# Patient Record
Sex: Female | Born: 1940 | Race: White | Hispanic: No | Smoking: Former smoker
Health system: Southern US, Community
[De-identification: ages and names within clinical notes are randomized; demographics above are authoritative.]

## PROBLEM LIST (undated history)

## (undated) DIAGNOSIS — J471 Bronchiectasis with (acute) exacerbation: Secondary | ICD-10-CM

## (undated) DIAGNOSIS — Z170 Estrogen receptor positive status [ER+]: Secondary | ICD-10-CM

## (undated) DIAGNOSIS — J439 Emphysema, unspecified: Secondary | ICD-10-CM

## (undated) DIAGNOSIS — N61 Mastitis without abscess: Secondary | ICD-10-CM

## (undated) DIAGNOSIS — Z8041 Family history of malignant neoplasm of ovary: Secondary | ICD-10-CM

## (undated) DIAGNOSIS — M1990 Unspecified osteoarthritis, unspecified site: Secondary | ICD-10-CM

## (undated) DIAGNOSIS — Z87891 Personal history of nicotine dependence: Secondary | ICD-10-CM

## (undated) DIAGNOSIS — Z8042 Family history of malignant neoplasm of prostate: Secondary | ICD-10-CM

## (undated) DIAGNOSIS — Z923 Personal history of irradiation: Secondary | ICD-10-CM

## (undated) DIAGNOSIS — I700 Atherosclerosis of aorta: Secondary | ICD-10-CM

## (undated) DIAGNOSIS — R0781 Pleurodynia: Secondary | ICD-10-CM

## (undated) DIAGNOSIS — R011 Cardiac murmur, unspecified: Secondary | ICD-10-CM

## (undated) DIAGNOSIS — M8580 Other specified disorders of bone density and structure, unspecified site: Secondary | ICD-10-CM

## (undated) DIAGNOSIS — K449 Diaphragmatic hernia without obstruction or gangrene: Secondary | ICD-10-CM

## (undated) DIAGNOSIS — F329 Major depressive disorder, single episode, unspecified: Secondary | ICD-10-CM

## (undated) DIAGNOSIS — E559 Vitamin D deficiency, unspecified: Secondary | ICD-10-CM

## (undated) DIAGNOSIS — K635 Polyp of colon: Secondary | ICD-10-CM

## (undated) DIAGNOSIS — J479 Bronchiectasis, uncomplicated: Secondary | ICD-10-CM

## (undated) DIAGNOSIS — J449 Chronic obstructive pulmonary disease, unspecified: Secondary | ICD-10-CM

## (undated) DIAGNOSIS — C801 Malignant (primary) neoplasm, unspecified: Secondary | ICD-10-CM

## (undated) DIAGNOSIS — Z801 Family history of malignant neoplasm of trachea, bronchus and lung: Secondary | ICD-10-CM

## (undated) DIAGNOSIS — Z1379 Encounter for other screening for genetic and chromosomal anomalies: Secondary | ICD-10-CM

## (undated) DIAGNOSIS — I2510 Atherosclerotic heart disease of native coronary artery without angina pectoris: Secondary | ICD-10-CM

## (undated) DIAGNOSIS — K219 Gastro-esophageal reflux disease without esophagitis: Secondary | ICD-10-CM

## (undated) DIAGNOSIS — C50312 Malignant neoplasm of lower-inner quadrant of left female breast: Secondary | ICD-10-CM

## (undated) DIAGNOSIS — B029 Zoster without complications: Secondary | ICD-10-CM

## (undated) DIAGNOSIS — J189 Pneumonia, unspecified organism: Secondary | ICD-10-CM

## (undated) DIAGNOSIS — R0600 Dyspnea, unspecified: Secondary | ICD-10-CM

## (undated) HISTORY — DX: Polyp of colon: K63.5

## (undated) HISTORY — DX: Major depressive disorder, single episode, unspecified: F32.9

## (undated) HISTORY — PX: TONSILLECTOMY: SUR1361

## (undated) HISTORY — PX: CATARACT EXTRACTION, BILATERAL: SHX1313

## (undated) HISTORY — PX: BREAST EXCISIONAL BIOPSY: SUR124

## (undated) HISTORY — DX: Encounter for other screening for genetic and chromosomal anomalies: Z13.79

## (undated) HISTORY — DX: Malignant neoplasm of lower-inner quadrant of left female breast: C50.312

## (undated) HISTORY — DX: Vitamin D deficiency, unspecified: E55.9

## (undated) HISTORY — DX: Estrogen receptor positive status (ER+): Z17.0

## (undated) HISTORY — DX: Family history of malignant neoplasm of prostate: Z80.42

## (undated) HISTORY — DX: Zoster without complications: B02.9

## (undated) HISTORY — DX: Pleurodynia: R07.81

## (undated) HISTORY — DX: Personal history of nicotine dependence: Z87.891

## (undated) HISTORY — DX: Mastitis without abscess: N61.0

## (undated) HISTORY — DX: Diaphragmatic hernia without obstruction or gangrene: K44.9

## (undated) HISTORY — DX: Bronchiectasis with (acute) exacerbation: J47.1

## (undated) HISTORY — DX: Other specified disorders of bone density and structure, unspecified site: M85.80

## (undated) HISTORY — DX: Chronic obstructive pulmonary disease, unspecified: J44.9

## (undated) HISTORY — DX: Family history of malignant neoplasm of ovary: Z80.41

## (undated) HISTORY — DX: Bronchiectasis, uncomplicated: J47.9

## (undated) HISTORY — DX: Atherosclerotic heart disease of native coronary artery without angina pectoris: I25.10

## (undated) HISTORY — DX: Emphysema, unspecified: J43.9

## (undated) HISTORY — DX: Family history of malignant neoplasm of trachea, bronchus and lung: Z80.1

## (undated) HISTORY — DX: Atherosclerosis of aorta: I70.0

---

## 1999-11-15 ENCOUNTER — Encounter: Admission: RE | Admit: 1999-11-15 | Discharge: 1999-11-15 | Payer: Self-pay | Admitting: Rheumatology

## 1999-11-15 ENCOUNTER — Encounter: Payer: Self-pay | Admitting: Rheumatology

## 1999-12-22 ENCOUNTER — Emergency Department (HOSPITAL_COMMUNITY): Admission: EM | Admit: 1999-12-22 | Discharge: 1999-12-22 | Payer: Self-pay | Admitting: Emergency Medicine

## 1999-12-22 ENCOUNTER — Encounter: Payer: Self-pay | Admitting: Emergency Medicine

## 2001-01-13 ENCOUNTER — Encounter: Admission: RE | Admit: 2001-01-13 | Discharge: 2001-01-13 | Payer: Self-pay | Admitting: Rheumatology

## 2001-01-13 ENCOUNTER — Encounter: Payer: Self-pay | Admitting: Rheumatology

## 2001-02-14 ENCOUNTER — Encounter: Payer: Self-pay | Admitting: Internal Medicine

## 2001-02-14 ENCOUNTER — Emergency Department (HOSPITAL_COMMUNITY): Admission: EM | Admit: 2001-02-14 | Discharge: 2001-02-14 | Payer: Self-pay | Admitting: Internal Medicine

## 2001-03-30 ENCOUNTER — Encounter (INDEPENDENT_AMBULATORY_CARE_PROVIDER_SITE_OTHER): Payer: Self-pay | Admitting: Specialist

## 2001-03-30 ENCOUNTER — Other Ambulatory Visit: Admission: RE | Admit: 2001-03-30 | Discharge: 2001-03-30 | Payer: Self-pay | Admitting: Obstetrics and Gynecology

## 2007-01-29 ENCOUNTER — Other Ambulatory Visit: Admission: RE | Admit: 2007-01-29 | Discharge: 2007-01-29 | Payer: Self-pay | Admitting: Family Medicine

## 2008-08-19 ENCOUNTER — Encounter: Admission: RE | Admit: 2008-08-19 | Discharge: 2008-08-19 | Payer: Self-pay | Admitting: Family Medicine

## 2011-02-14 DIAGNOSIS — E559 Vitamin D deficiency, unspecified: Secondary | ICD-10-CM

## 2011-02-14 HISTORY — DX: Vitamin D deficiency, unspecified: E55.9

## 2011-02-21 ENCOUNTER — Other Ambulatory Visit: Payer: Self-pay | Admitting: Family Medicine

## 2011-02-21 ENCOUNTER — Ambulatory Visit (INDEPENDENT_AMBULATORY_CARE_PROVIDER_SITE_OTHER): Payer: Medicare Other | Admitting: Family Medicine

## 2011-02-21 ENCOUNTER — Other Ambulatory Visit (HOSPITAL_COMMUNITY)
Admission: RE | Admit: 2011-02-21 | Discharge: 2011-02-21 | Disposition: A | Discharge status: Home / Self Care | Payer: Medicare Other | Source: Ambulatory Visit | Attending: Family Medicine | Admitting: Family Medicine

## 2011-02-21 DIAGNOSIS — Z780 Asymptomatic menopausal state: Secondary | ICD-10-CM

## 2011-02-21 DIAGNOSIS — Z1231 Encounter for screening mammogram for malignant neoplasm of breast: Secondary | ICD-10-CM

## 2011-02-21 DIAGNOSIS — Z01419 Encounter for gynecological examination (general) (routine) without abnormal findings: Secondary | ICD-10-CM

## 2011-02-21 DIAGNOSIS — M899 Disorder of bone, unspecified: Secondary | ICD-10-CM

## 2011-02-21 DIAGNOSIS — Z124 Encounter for screening for malignant neoplasm of cervix: Secondary | ICD-10-CM | POA: Insufficient documentation

## 2011-02-21 DIAGNOSIS — R5383 Other fatigue: Secondary | ICD-10-CM

## 2011-02-21 DIAGNOSIS — Z0000 Encounter for general adult medical examination without abnormal findings: Secondary | ICD-10-CM

## 2011-03-05 ENCOUNTER — Ambulatory Visit
Admission: RE | Admit: 2011-03-05 | Discharge: 2011-03-05 | Disposition: A | Discharge status: Home / Self Care | Payer: Medicare Other | Source: Ambulatory Visit | Attending: Family Medicine | Admitting: Family Medicine

## 2011-03-05 DIAGNOSIS — Z1231 Encounter for screening mammogram for malignant neoplasm of breast: Secondary | ICD-10-CM

## 2011-03-05 DIAGNOSIS — Z780 Asymptomatic menopausal state: Secondary | ICD-10-CM

## 2011-03-17 HISTORY — PX: SHOULDER SURGERY: SHX246

## 2011-05-24 ENCOUNTER — Telehealth: Payer: Self-pay | Admitting: Family Medicine

## 2011-05-24 NOTE — Telephone Encounter (Signed)
Please pull pt's chart, and let patient know it will be addressed on Monday (and call me if needs to be done sooner). Thanks

## 2011-05-25 NOTE — Telephone Encounter (Signed)
Returned call to patient, and patient stated that Monday will be fine to start the Prozac.  CM, LPN

## 2011-05-27 ENCOUNTER — Telehealth: Payer: Self-pay | Admitting: Family Medicine

## 2011-05-27 NOTE — Telephone Encounter (Signed)
Noted  

## 2011-05-27 NOTE — Telephone Encounter (Signed)
Advise patient (see previous phone message requesting Prozac)--I rec OV be scheduled (last seen 3 months ago, and was doing fine.  Old records reviewed--only have through '09 and no record of Prozac/depression, so I'd prefer OV rather than just rx'ing medication).  Thanks

## 2011-05-27 NOTE — Telephone Encounter (Signed)
Spoke with patient, she is scheduled to see Dr.Knapp Wed 05/29/11 @ 1:30 for consult re: Prozac.

## 2011-05-28 NOTE — Telephone Encounter (Signed)
HANDLED BY KNAPP ON MON PER NOTES

## 2011-05-29 ENCOUNTER — Ambulatory Visit (INDEPENDENT_AMBULATORY_CARE_PROVIDER_SITE_OTHER): Payer: Medicare Other | Admitting: Family Medicine

## 2011-05-29 ENCOUNTER — Encounter: Payer: Self-pay | Admitting: Family Medicine

## 2011-05-29 VITALS — BP 100/68 | HR 64 | Ht 69.0 in | Wt 151.5 lb

## 2011-05-29 DIAGNOSIS — F329 Major depressive disorder, single episode, unspecified: Secondary | ICD-10-CM

## 2011-05-29 MED ORDER — FLUOXETINE HCL 20 MG PO TABS: 20.0000 mg | ORAL_TABLET | Freq: Every day | ORAL | Status: DC

## 2011-05-29 NOTE — Progress Notes (Signed)
Subjective:    Patient ID: Molly Ross, female    DOB: August 16, 1941, 70 y.o.   MRN: [id number]  HPI Patient had L shoulder surgery in April (for frozen shoulder and a cyst)--still having issues with stiffness, and requiring physical therapy.  Also had to have a tooth pulled, needing a bridge, a lot of expense--she started feeling overwhelmed about 2 weeks ago.  Financial strain, tired of going to therapy. Also has a close friend with terminal cancer, "not doing well".  Crying, not sleeping well at night (unless she takes the pain pills).  Trouble both falling and staying asleep.  Some diarrhea over the last 2 weeks, thinks might be from her nerves.  Reports decreased appetite.    She is keeping her 20 year old granddaughter this summer, which helps keep her busy, which helps.  When she's alone, she is lying in bed a lot. +hopelessness, helplessness. Denies SI, HI.  Previously took Prozac in 2000 or 2001, related to marital stress and didn't have any problems or side effects.  Past Medical History  Diagnosis Date  . Colon polyp   . Unspecified vitamin D deficiency 02/2011  . Shingles 1/09 and 07/2010    Past Surgical History  Procedure Date  . Shoulder surgery 03/2011    for frozen shoulder, and cyst removal (Dr. Eulah Pont)    History   Social History  . Marital Status: Widowed    Spouse Name: N/A    Number of Children: N/A  . Years of Education: N/A   Occupational History  . Not on file.   Social History Main Topics  . Smoking status: Former Smoker    Quit date: 06/28/1980  . Smokeless tobacco: Not on file  . Alcohol Use: Yes     socially 1 glass of wine 1-2 times monthly  . Drug Use: Not on file  . Sexually Active: Not on file   Other Topics Concern  . Not on file   Social History Narrative  . No narrative on file    Family History  Problem Relation Age of Onset  . Stroke Mother   . Diabetes Mother   . Stroke Sister 60    brainstem stroke  . Cancer Neg Hx    no breast/colon cancer    Current outpatient prescriptions:Calcium Carbonate-Vitamin D (CALCIUM-VITAMIN D) 500-200 MG-UNIT per tablet, Take 1 tablet by mouth 2 (two) times daily with a meal.  , Disp: , Rfl: ;  ibuprofen (ADVIL,MOTRIN) 800 MG tablet, Take 800 mg by mouth as needed.  , Disp: , Rfl: ;  oxyCODONE-acetaminophen (PERCOCET) 7.5-325 MG per tablet, Take 1 tablet by mouth every 4 (four) hours as needed.  , Disp: , Rfl:  DISCONTD: HYDROcodone-acetaminophen (NORCO) 10-325 MG per tablet, , Disp: , Rfl: ;  FLUoxetine (PROZAC) 20 MG tablet, Take 1 tablet (20 mg total) by mouth daily., Disp: 30 tablet, Rfl: 5;  DISCONTD: oxyCODONE-acetaminophen (PERCOCET) 7.5-325 MG per tablet, , Disp: , Rfl:   No Known Allergies    Review of Systems Denies headaches, chest pain. GI symptoms, skin concerns. + shoulder pain, insomnia, depression.  Denies anxiety    Objective:   Physical Exam BP 100/68  Pulse 64  Ht 5\' 9"  (1.753 m)  Wt 151 lb 8 oz (68.72 kg)  BMI 22.37 kg/m2  Well developed, pleasant female, who is tearful throughout visit, but exhibits full range of affect, intermittently smiling.  Normal speech, eye contact, hygiene and grooming.       Assessment &  Plan:   1. Depressive disorder, not elsewhere classified  FLUoxetine (PROZAC) 20 MG tablet   Risks and side effects of meds reviewed.  Will start at 1/2 tablet in the morning for a week, then, if tolerated, will increase to full tablet daily.  She is asked to f/u in 4-6 weeks, sooner if not doing well, or not tolerating the medication.  If doing very well at this dose, she can elect to cancel her 6 wk f/u and call and let us know that she is doing well (if making a f/u appt would cause her more stress)--6 months of meds given.  She contracts for safety.  Insomnia--discussed that this is a symptom of her depression and should improve with treatment.  In the interim, she can try OTC meds that contain diphenhydramine (ie. A tylenol PM at  bedtime).  If these aren't working, can consider Ambien.  (30 minutes visit, all counseling, all face to face)

## 2011-07-12 ENCOUNTER — Encounter: Payer: Self-pay | Admitting: Family Medicine

## 2011-07-15 ENCOUNTER — Ambulatory Visit: Payer: Medicare Other | Admitting: Family Medicine

## 2011-08-08 ENCOUNTER — Ambulatory Visit: Payer: Medicare Other | Admitting: Family Medicine

## 2011-08-15 ENCOUNTER — Ambulatory Visit: Payer: Medicare Other | Admitting: Family Medicine

## 2011-12-04 ENCOUNTER — Ambulatory Visit (INDEPENDENT_AMBULATORY_CARE_PROVIDER_SITE_OTHER): Payer: Medicare Other | Admitting: Family Medicine

## 2011-12-04 ENCOUNTER — Encounter: Payer: Self-pay | Admitting: Family Medicine

## 2011-12-04 VITALS — BP 120/70 | HR 68 | Temp 98.1°F | Ht 69.0 in | Wt 154.0 lb

## 2011-12-04 DIAGNOSIS — F329 Major depressive disorder, single episode, unspecified: Secondary | ICD-10-CM

## 2011-12-04 DIAGNOSIS — J209 Acute bronchitis, unspecified: Secondary | ICD-10-CM

## 2011-12-04 DIAGNOSIS — F3289 Other specified depressive episodes: Secondary | ICD-10-CM

## 2011-12-04 HISTORY — DX: Other specified depressive episodes: F32.89

## 2011-12-04 HISTORY — DX: Major depressive disorder, single episode, unspecified: F32.9

## 2011-12-04 MED ORDER — AZITHROMYCIN 250 MG PO TABS: ORAL_TABLET | ORAL | Status: AC

## 2011-12-04 MED ORDER — FLUOXETINE HCL 20 MG PO TABS: 20.0000 mg | ORAL_TABLET | Freq: Every day | ORAL | Status: DC

## 2011-12-04 NOTE — Patient Instructions (Addendum)
OTC measures to help with sleep --ie. Melatonin, PM meds containing diphenhydramine (ie Benadryl, Simply Sleep, Tylenol PM, etc.)  Please return if you develop fevers, worsening shortness of breath, chest pain.  Diet for GERD or PUD Nutrition therapy can help ease the discomfort of gastroesophageal reflux disease (GERD) and peptic ulcer disease (PUD).  HOME CARE INSTRUCTIONS   Eat your meals slowly, in a relaxed setting.   Eat 5 to 6 small meals per day.   If a food causes distress, stop eating it for a period of time.  FOODS TO AVOID  Coffee, regular or decaffeinated.   Cola beverages, regular or low calorie.   Tea, regular or decaffeinated.   Pepper.   Cocoa.   High fat foods, including meats.   Butter, margarine, hydrogenated oil (trans fats).   Peppermint or spearmint (if you have GERD).   Fruits and vegetables if not tolerated.   Alcohol.   Nicotine (smoking or chewing). This is one of the most potent stimulants to acid production in the gastrointestinal tract.   Any food that seems to aggravate your condition.  If you have questions regarding your diet, ask your caregiver or a registered dietitian. TIPS  Lying flat may make symptoms worse. Keep the head of your bed raised 6 to 9 inches (15 to 23 cm) by using a foam wedge or blocks under the legs of the bed.   Do not lay down until 3 hours after eating a meal.   Daily physical activity may help reduce symptoms.  MAKE SURE YOU:   Understand these instructions.   Will watch your condition.   Will get help right away if you are not doing well or get worse.  Document Released: 12/02/2005 Document Revised: 08/14/2011 Document Reviewed: 04/17/2009 [facility] Patient Information 2012 [place], [place].

## 2011-12-04 NOTE — Progress Notes (Signed)
Started with a sore throat over a week ago.  The sore throat resolved pretty quickly, but then changed to head congestion, some sinus pressure especially in the mornings.  Nasal mucus has been clear, yellow and bloody.  Coughing up some green phlegm ever since a previous URI which never completely resolved.  She was sick with a bad cold 6 weeks ago (sore throat, cough, head congestion)--everything got better except that she continued to cough up discolored mucus.  She has been having some shortness of breath with exertion since being sick x 6 weeks.  Denies fevers.  Denies sick contact.  F/u depression--she was started on Prozac in June for symptoms of depression.  She tolerated it well without side effects.  She tells me that she tapered it off on her own, taking it every few days, but being off entirely for the last week.  Has tried a friend's xanax at bedtime with good results.  Having trouble both falling and staying asleep.  She admits that these did get better while on Prozac, and getting worse since she has cut back on the dose and stopped it.  ROS: Denies nausea, vomiting, diarrhea, skin rashes, myalgias or other concerns.  No urinary complaints. + acid reflux was bad for a while, improved over the last couple of weeks.  Was burping after eating most foods. Used Prilosec, doesn't remember if it helped much at all, but doing better now. +insomnia  PHYSICAL EXAM: BP 120/70  Pulse 68  Temp(Src) 98.1 F (36.7 C) (Oral)  Ht 5\' 9"  (1.753 m)  Wt 154 lb (69.854 kg)  BMI 22.74 kg/m2 Well developed, pleasant female, occasional dry cough, speaking in full sentences without any distress Nasal mucosa minimal edema, clear mucus. Sinuses nontender.  OP clear without erythema Neck: no lymphadenopathy or mass Heart: regular rate and rhythm without murmur Lungs: coarse and trace crackle at R base, clear elsewhere. No wheezes Abdomen: nontender, no organomegaly or mass Skin: no rash Psych: normal hygiene  and grooming, appears tired, slightly depressed (but may be due to feeling ill)  ASSESSMENT/PLAN: 1. Bronchitis, acute  azithromycin (ZITHROMAX) 250 MG tablet  2. Depressive disorder, not elsewhere classified  FLUoxetine (PROZAC) 20 MG tablet   Bronchitis (vs early pneumonia)--discussed importance of following up if cough or SOB isn't improving.  Will then need CXR.  Treat with z-pak  Insomnia--seems to be a symptom of recurrent depression, as sleep was improved while she was on prozac.  Refill prozac.  Discussed other short-term OTC measures to help with sleep (ie. Melatonin, PM meds containing diphenhydramine).  Discussed potential risks from using xanax for sleep and I prefer not to rx.  GERD--precautions reviewed. Discussed OTC meds to use prn

## 2011-12-09 ENCOUNTER — Other Ambulatory Visit: Payer: Self-pay | Admitting: Family Medicine

## 2011-12-09 NOTE — Telephone Encounter (Signed)
Is this ok?

## 2011-12-09 NOTE — Telephone Encounter (Signed)
Pharmacist said this had already been done

## 2011-12-09 NOTE — Telephone Encounter (Signed)
Please verify with pharm if they got rx sent 12/19 (for #90 with 1 refill)

## 2011-12-09 NOTE — Telephone Encounter (Signed)
I just sent [facility] to [facility] last week--please verify that pharmacy received rx.  Thanks

## 2011-12-12 ENCOUNTER — Telehealth: Payer: Self-pay | Admitting: Family Medicine

## 2011-12-12 ENCOUNTER — Ambulatory Visit
Admission: RE | Admit: 2011-12-12 | Discharge: 2011-12-12 | Disposition: A | Discharge status: Home / Self Care | Payer: Medicare Other | Source: Ambulatory Visit | Attending: Family Medicine | Admitting: Family Medicine

## 2011-12-12 ENCOUNTER — Other Ambulatory Visit: Payer: Self-pay | Admitting: Family Medicine

## 2011-12-12 DIAGNOSIS — R0789 Other chest pain: Secondary | ICD-10-CM

## 2011-12-12 MED ORDER — LEVOFLOXACIN 500 MG PO TABS: 500.0000 mg | ORAL_TABLET | Freq: Every day | ORAL | Status: AC

## 2011-12-12 NOTE — Telephone Encounter (Signed)
Pt states that when she saw dr knapp last week she was informed if she started having chest tightness dr knapp wanted to send her for xrays. Pt states she does have a history of pneumonia and is now having some chest discomfort. Pt sates that she has finished the zpak she was given last week. Pt uses [facility] [address]. Please call pt with status of x ray or further instructions

## 2011-12-12 NOTE — Telephone Encounter (Signed)
Sent levaquin 500mg  qd #10 to [facility] [address]

## 2011-12-12 NOTE — Telephone Encounter (Signed)
Get a cxr  Switch to Levaquin 500 Qd #10

## 2011-12-12 NOTE — Telephone Encounter (Signed)
Pt has been called and will pick up request for c/x-ray

## 2011-12-19 ENCOUNTER — Ambulatory Visit (INDEPENDENT_AMBULATORY_CARE_PROVIDER_SITE_OTHER): Payer: Medicare Other | Admitting: Family Medicine

## 2011-12-19 ENCOUNTER — Ambulatory Visit: Payer: Medicare Other | Admitting: Family Medicine

## 2011-12-19 ENCOUNTER — Encounter: Payer: Self-pay | Admitting: Family Medicine

## 2011-12-19 DIAGNOSIS — J189 Pneumonia, unspecified organism: Secondary | ICD-10-CM

## 2011-12-19 DIAGNOSIS — M76899 Other specified enthesopathies of unspecified lower limb, excluding foot: Secondary | ICD-10-CM

## 2011-12-19 DIAGNOSIS — M7072 Other bursitis of hip, left hip: Secondary | ICD-10-CM

## 2011-12-19 MED ORDER — NAPROXEN 500 MG PO TABS: 500.0000 mg | ORAL_TABLET | Freq: Two times a day (BID) | ORAL | Status: DC

## 2011-12-19 NOTE — Progress Notes (Signed)
Patient presents for f/u pneumonia.  She was originally seen 12/19 with bronchitis vs early pneumonia, treated with z-pak, didn't get better. Started feeling worse, with chest pain starting 12/26.  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  Her antibiotics had been changed to Levaquin, and she is feeling better.  Initially was having pain on R side, which then spread to the left.  No longer having any pain with breathing.  Still has slight cough, no longer coughing any discolored phlegm.  Denies fevers.  Past Medical History  Diagnosis Date  . Colon polyp   . Unspecified vitamin D deficiency 02/2011  . Shingles 1/09 and 07/2010  . Vitamin D deficiency 02/2001    Past Surgical History  Procedure Date  . Shoulder surgery 03/2011    for frozen shoulder, and cyst removal (Dr. Eulah Pont)    History   Social History  . Marital Status: Widowed    Spouse Name: N/A    Number of Children: N/A  . Years of Education: N/A   Occupational History  . Not on file.   Social History Main Topics  . Smoking status: Former Smoker    Quit date: 06/28/1980  . Smokeless tobacco: Never Used   Comment: smoked 1-2 PPD x 20 years  . Alcohol Use: Yes     socially 1 glass of wine 1-2 times monthly  . Drug Use: Not on file  . Sexually Active: Not on file   Other Topics Concern  . Not on file   Social History Narrative  . No narrative on file    Family History  Problem Relation Age of Onset  . Stroke Mother   . Diabetes Mother   . Stroke Sister 34    brainstem stroke  . Cancer Neg Hx     no breast/colon cancer    Current outpatient prescriptions:Calcium Carbonate-Vitamin D (CALCIUM-VITAMIN D) 500-200 MG-UNIT per tablet, Take 1 tablet by mouth 2 (two) times daily with a meal.  , Disp: , Rfl: ;  FLUoxetine (PROZAC) 20 MG tablet, Take 1 tablet (20 mg total) by mouth daily., Disp: 90  tablet, Rfl: 1;  ibuprofen (ADVIL,MOTRIN) 800 MG tablet, Take 800 mg by mouth as needed.  , Disp: , Rfl:  levofloxacin (LEVAQUIN) 500 MG tablet, Take 1 tablet (500 mg total) by mouth daily., Disp: 10 tablet, Rfl: 0;  FLUoxetine (PROZAC) 20 MG tablet, Take 1 tablet (20 mg total) by mouth daily., Disp: 30 tablet, Rfl: 5;  naproxen (NAPROSYN) 500 MG tablet, Take 1 tablet (500 mg total) by mouth 2 (two) times daily with a meal., Disp: 30 tablet, Rfl: 0  No Known Allergies  ROS:  Discomfort in L hip--plans to see Murphy-Wainer again soon.  Worse with cold weather. Uses Aleve occasionally in the evenings, with some benefit.  Denies fevers, headaches, shortness of breath, chest pain, or other concerns  PHYSICAL EXAM: Well developed, pleasant female in no distress BP 98/68  Pulse 64  Temp(Src) 97.5 F (36.4 C) (Oral)  Ht 5\' 9"  (1.753 m)  Wt 158 lb (71.668 kg)  BMI 23.33 kg/m2 Neck: no lymphadenopathy Heart: regular rate and rhythm Lungs clear bilaterally with good air movement Extremities: no edema. Tender at L trochanteric bursa Skin: no rash Psych: normal mood  ASSESSMENT/PLAN:  1. Pneumonia  DG Chest 2 View  2. Hip bursitis, left  naproxen (NAPROSYN) 500 MG tablet   Hip bursitis--Naprosyn--NSAID  precautions reviewed.  If not improving, return for cortisone injection Pneumonia--clinically resolving.  Repeat CXR in a few weeks

## 2011-12-19 NOTE — Patient Instructions (Addendum)
Don't use ibuprofen, or other anti-inflammatories along with the naproxen. You may use Tylenol along with it, if needed for additional pain relief.  Return for possible cortisone shot if not improving.  You need a repeat chest x-ray in 3-4 weeks.  Orders are in system, go at your convenience.  Complete your full course of Levaquin antibiotic.  Hip Bursitis Bursitis is a swelling and soreness (inflammation) of a fluid-filled sac (bursa). This sac overlies and protects the joints.  CAUSES   Injury.   Overuse of the muscles surrounding the joint.   Arthritis.   Gout.   Infection.   Cold weather.   Inadequate warm-up and conditioning prior to activities.  The cause may not be known.  SYMPTOMS   Mild to severe irritation.   Tenderness and swelling over the outside of the hip.   Pain with motion of the hip.   If the bursa becomes infected, a fever may be present. Redness, tenderness, and warmth will develop over the hip.  Symptoms usually lessen in 3 to 4 weeks with treatment, but can come back. TREATMENT If conservative treatment does not work, your caregiver may advise draining the bursa and injecting cortisone into the area. This may speed up the healing process. This may also be used as an initial treatment of choice. HOME CARE INSTRUCTIONS   Apply ice to the affected area for 15 to 20 minutes every 3 to 4 hours while awake for the first 2 days. Put the ice in a plastic bag and place a towel between the bag of ice and your skin.   Rest the painful joint as much as possible, but continue to put the joint through a normal range of motion at least 4 times per day. When the pain lessens, begin normal, slow movements and usual activities to help prevent stiffness of the hip.   Only take over-the-counter or prescription medicines for pain, discomfort, or fever as directed by your caregiver.   Use crutches to limit weight bearing on the hip joint, if advised.   Elevate your  painful hip to reduce swelling. Use pillows for propping and cushioning your legs and hips.   Gentle massage may provide comfort and decrease swelling.  SEEK IMMEDIATE MEDICAL CARE IF:   Your pain increases even during treatment, or you are not improving.   You have a fever.   You have heat and inflammation over the involved bursa.   You have any other questions or concerns.  MAKE SURE YOU:   Understand these instructions.   Will watch your condition.   Will get help right away if you are not doing well or get worse.  Document Released: 05/24/2002 Document Revised: 08/14/2011 Document Reviewed: 12/21/2008 [facility] Patient Information 2012 [place], [place].

## 2012-04-22 ENCOUNTER — Ambulatory Visit (INDEPENDENT_AMBULATORY_CARE_PROVIDER_SITE_OTHER): Payer: Medicare Other | Admitting: Family Medicine

## 2012-04-22 ENCOUNTER — Encounter: Payer: Self-pay | Admitting: Family Medicine

## 2012-04-22 VITALS — BP 120/68 | HR 64 | Temp 98.2°F | Ht 69.0 in | Wt 160.0 lb

## 2012-04-22 DIAGNOSIS — J209 Acute bronchitis, unspecified: Secondary | ICD-10-CM

## 2012-04-22 MED ORDER — LEVOFLOXACIN 500 MG PO TABS: 500.0000 mg | ORAL_TABLET | Freq: Every day | ORAL | Status: AC

## 2012-04-22 NOTE — Progress Notes (Signed)
Chief complaint: cough.  Started to get a sore throat this past  Saturday. Still has sore throat, nasal drainage and cough  HPI: Four days ago began with scratchy throat, which then developed into head congestion.  Mucus from nose is yellow or clear, but coughing up green phlegm.  Having headaches across the whole front of her head.  Denies cheek/teeth pain.  + sore throat.  Cough is worse when lying down.  Has taken Alka Selzer Cold day and night, doesn't notice much improvement--just started it yesterday.  Hoarseness developed yesterday, throat feels raw  Denies sick contacts (son was sick, but she wasn't around him much).  Denies fevers.  Feels tired/drained.  Denies myalgias.  Past Medical History  Diagnosis Date  . Colon polyp   . Unspecified vitamin D deficiency 02/2011  . Shingles 1/09 and 07/2010  . Vitamin d deficiency 02/2001    Past Surgical History  Procedure Date  . Shoulder surgery 03/2011    for frozen shoulder, and cyst removal (Dr. Eulah Pont)    History   Social History  . Marital Status: Widowed    Spouse Name: N/A    Number of Children: N/A  . Years of Education: N/A   Occupational History  . Not on file.   Social History Main Topics  . Smoking status: Former Smoker    Quit date: 06/28/1980  . Smokeless tobacco: Never Used   Comment: smoked 1-2 PPD x 20 years  . Alcohol Use: Yes     socially 1 glass of wine 1-2 times monthly  . Drug Use: Not on file  . Sexually Active: Not on file   Other Topics Concern  . Not on file   Social History Narrative  . No narrative on file    Family History  Problem Relation Age of Onset  . Stroke Mother   . Diabetes Mother   . Stroke Sister 56    brainstem stroke  . Cancer Neg Hx     no breast/colon cancer   Current Outpatient Prescriptions on File Prior to Visit  Medication Sig Dispense Refill  . Calcium Carbonate-Vitamin D (CALCIUM-VITAMIN D) 500-200 MG-UNIT per tablet Take 1 tablet by mouth 2 (two) times daily  with a meal.        . FLUoxetine (PROZAC) 20 MG tablet Take 1 tablet (20 mg total) by mouth daily.  90 tablet  1  . ibuprofen (ADVIL,MOTRIN) 800 MG tablet Take 800 mg by mouth as needed.        Marland Kitchen FLUoxetine (PROZAC) 20 MG tablet Take 1 tablet (20 mg total) by mouth daily.  30 tablet  5   No Known Allergies  ROS: Denies fevers, nausea, vomiting, diarrhea. No skin rashes, back pain, urinary concerns (some stress incontinence).  PHYSICAL EXAM: BP 120/68  Pulse 64  Temp(Src) 98.2 F (36.8 C) (Oral)  Ht 5\' 9"  (1.753 m)  Wt 160 lb (72.576 kg)  BMI 23.63 kg/m2 Well developed, pleasant female with deep, wet-sounding cough, and hoarse voice.  Speaking easily in full sentences, in no distress. Mildly ill-appearing HEENT: PERRL, conjunctiva clear.  Tm's and EAC's normal.  OP with thick whitish-yellow phlegm in back of throat. Nasal mucosa mildly edematous, clear-white mucus.  Sinuses nontender Neck: no lymphadenopathy or mass Heart: regular rate and rhythm without murmur Lungs:  ronchi throughout--some clearing after cough, but persistent ronchi and occasional wheeze. Nonfocal, diffuse. Skin: no rash Psych: normal mood, appears tired Neuro: alert and oriented. Cranial nerves grossly intact. Normal gait  ASSESSMENT/PLAN: 1. Acute bronchitis  levofloxacin (LEVAQUIN) 500 MG tablet   Acute bronchitis--given lack of clearing of abnormal breath sounds after expectorating decent amount of thick green phlegm,  (much darker than the mucus draining down back of throat) and given her age, will cover with ABX.  In the past, z-pak hasn't been effective.  Treat with Levaquin.  F/u 1 week if not improving or worse.

## 2012-04-22 NOTE — Patient Instructions (Signed)
Drink plenty of fluids.  I recommend using Mucinex (plain or DM) twice daily. If you choose to get the plain Mucinex, but are still coughing a lot, you may use Delsym syrup (which is the DM portion separately).  Return in 1 week if symptoms aren't improving, for re-evaluation, sooner if you develop high fevers, worsening shortness of breath, bloody phlegm or other new concerns.

## 2013-07-28 ENCOUNTER — Encounter: Payer: Self-pay | Admitting: Medical

## 2013-07-28 ENCOUNTER — Ambulatory Visit (INDEPENDENT_AMBULATORY_CARE_PROVIDER_SITE_OTHER): Payer: Medicare Other | Admitting: Medical

## 2013-07-28 ENCOUNTER — Ambulatory Visit (HOSPITAL_COMMUNITY)
Admission: RE | Admit: 2013-07-28 | Discharge: 2013-07-28 | Disposition: A | Discharge status: Home / Self Care | Payer: Medicare Other | Source: Ambulatory Visit | Attending: Medical | Admitting: Medical

## 2013-07-28 VITALS — BP 100/60 | HR 89 | Temp 98.3°F | Resp 18 | Wt 157.0 lb

## 2013-07-28 DIAGNOSIS — R918 Other nonspecific abnormal finding of lung field: Secondary | ICD-10-CM

## 2013-07-28 DIAGNOSIS — R05 Cough: Secondary | ICD-10-CM

## 2013-07-28 DIAGNOSIS — R071 Chest pain on breathing: Secondary | ICD-10-CM | POA: Insufficient documentation

## 2013-07-28 DIAGNOSIS — R0602 Shortness of breath: Secondary | ICD-10-CM

## 2013-07-28 DIAGNOSIS — R059 Cough, unspecified: Secondary | ICD-10-CM | POA: Insufficient documentation

## 2013-07-28 DIAGNOSIS — Z8701 Personal history of pneumonia (recurrent): Secondary | ICD-10-CM

## 2013-07-28 DIAGNOSIS — R0781 Pleurodynia: Secondary | ICD-10-CM

## 2013-07-28 LAB — CBC WITH DIFFERENTIAL/PLATELET
Basophils Absolute: 0 10*3/uL (ref 0.0–0.1)
Eosinophils Absolute: 0.1 10*3/uL (ref 0.0–0.7)
Eosinophils Relative: 1 % (ref 0–5)
HCT: 37.7 % (ref 36.0–46.0)
Lymphocytes Relative: 19 % (ref 12–46)
MCH: 29.5 pg (ref 26.0–34.0)
MCV: 85.7 fL (ref 78.0–100.0)
Monocytes Absolute: 0.9 10*3/uL (ref 0.1–1.0)
Platelets: 323 10*3/uL (ref 150–400)
RDW: 12.8 % (ref 11.5–15.5)
WBC: 13 10*3/uL — ABNORMAL HIGH (ref 4.0–10.5)

## 2013-07-28 NOTE — Progress Notes (Signed)
  Subjective:    Molly Ross is a 72 y.o. female who presents for evaluation of cough, shortness of breath. Dyspnea has been mild to moderate, and generally lasting all day today.  Started this morning, and not improving.  Associated symptoms include pain with deep breathing and cough.  Cough is not dry, but not productive either.  Denies fever, chills, sinus pressure, sore throat, ear pain, nausea, vomiting, diaphoresis.  No sick contacts.  She does note hx/o multiple episodes of pneumonia in the past, hx/o rheumatic fever age 70yo.   Patient's cardiac risk factors are: advanced age (older than 32 for men, 18 for women). Patient's risk factors for DVT/PE: none. Previous cardiac testing: none.  The following portions of the patient's history were reviewed and updated as appropriate: allergies, current medications, past family history, past medical history, past social history, past surgical history and problem list.  Review of Systems As in subjective   Past Medical History  Diagnosis Date  . Colon polyp   . Unspecified vitamin D deficiency 02/2011  . Shingles 1/09 and 07/2010  . Vitamin D deficiency 02/2001    Objective:     Filed Vitals:   07/28/13 1618  BP: 100/60  Pulse: 89  Temp: 98.3 F (36.8 C)  Resp: 18    General appearance: alert, no distress, WD/WN, female  HEENT: normocephalic, conjunctiva/corneas normal, sclerae anicteric, PERRLA, EOMi, nares patent, no discharge or erythema, pharynx normal Oral cavity: MMM,no lesions Neck: supple, no lymphadenopathy, no thyromegaly, no JVD, no bruits, no masses, normal ROM Chest: non tender, normal shape and expansion Heart: RRR, normal S1, S2, no murmurs Lungs: faint crackles right mid and lower fields, audible wheezes when supine, but no wheezes on lung fields Abdomen: +bs, soft, non tender, non distended, no masses, no hepatomegaly, no splenomegaly, no bruits Back: non tender Extremities: no edema, no cyanosis, no  clubbing Pulses: 2+ symmetric    Adult ECG Report  Indication: chest pain, dyspnea, cough  Rate: 85bpm  Rhythm: normal sinus rhythm and premature atrial contractions (PAC)  QRS Axis: 46 degrees  PR Interval:  QRS Duration: 76ms  QTc:  Conduction Disturbances: none  Other Abnormalities: none  Patient's cardiac risk factors are: advanced age (older than 76 for men, 62 for women).  EKG comparison: none  Narrative Interpretation: sinus rhythm with PACs, nonspecific ST abnormality     Assessment:   Encounter Diagnoses  Name Primary?  . Pleuritic chest pain Yes  . SOB (shortness of breath)   . History of pneumonia   . Cough   . Other nonspecific abnormal finding of lung field      Plan   Discussed possible causes of her symptoms.  Suspect bronchitis, but additional eval today.  She does have hx/o pneumonia multiple times prior with similar presenting symptoms .  EKG reviewed.  We will send for CXR, labs sent stat.  We will call back with results, plan.

## 2013-07-30 ENCOUNTER — Telehealth: Payer: Self-pay | Admitting: Medical

## 2013-07-30 ENCOUNTER — Other Ambulatory Visit: Payer: Self-pay | Admitting: Medical

## 2013-07-30 MED ORDER — METHYLPREDNISOLONE (PAK) 4 MG PO TABS: ORAL_TABLET | ORAL | Status: DC

## 2013-07-30 MED ORDER — LEVOFLOXACIN 500 MG PO TABS: 500.0000 mg | ORAL_TABLET | Freq: Every day | ORAL | Status: DC

## 2013-08-05 NOTE — Telephone Encounter (Signed)
SHANE SENT IN MEDS

## 2013-08-17 ENCOUNTER — Encounter: Payer: Self-pay | Admitting: Family Medicine

## 2013-08-17 ENCOUNTER — Ambulatory Visit (INDEPENDENT_AMBULATORY_CARE_PROVIDER_SITE_OTHER): Payer: Medicare Other | Admitting: Family Medicine

## 2013-08-17 ENCOUNTER — Telehealth: Payer: Self-pay | Admitting: Internal Medicine

## 2013-08-17 VITALS — BP 116/70 | HR 78 | Temp 100.1°F | Wt 155.0 lb

## 2013-08-17 DIAGNOSIS — J209 Acute bronchitis, unspecified: Secondary | ICD-10-CM

## 2013-08-17 MED ORDER — LEVOFLOXACIN 500 MG PO TABS: 500.0000 mg | ORAL_TABLET | Freq: Every day | ORAL | Status: DC

## 2013-08-17 NOTE — Patient Instructions (Addendum)
Take all the antibiotic and if not totally back to normal give [place] a call. Tylenol for the aches and pains and you can also use 2 Aleve twice per day for aches and pains as well as fever

## 2013-08-17 NOTE — Telephone Encounter (Signed)
This med did not go through the first time and pt called to see if we could resend it in since the pharmacy had no record of it.

## 2013-08-17 NOTE — Progress Notes (Signed)
  Subjective:    Patient ID: Molly Ross, female    DOB: 1941-06-26, 72 y.o.   MRN: [id number]  HPI She is here for evaluation of a two-day history this started with sore throat, productive cough, fever chills, fatigue area and she was treated 2 weeks ago for presumed pneumonia with Levaquin. She finished this approximately one week ago. She states that when she finished the medication she felt  back to normal. She does not smoke.   Review of Systems     Objective:   Physical Exam alert and in no distress. Tympanic membranes and canals are normal. Throat is clear. Tonsils are normal. Neck is supple without adenopathy or thyromegaly. Cardiac exam shows a regular sinus rhythm without murmurs or gallops. Lungs are clear to auscultation.        Assessment & Plan:  Acute bronchitis - Plan: levofloxacin (LEVAQUIN) 500 MG tablet  her symptoms seem to come on when to quickly. since she was recently on Levaquin, and decided to continue her on this. She is to call if not entirely better at the end of the course of treatment.

## 2013-10-28 ENCOUNTER — Encounter: Payer: Self-pay | Admitting: *Deleted

## 2013-12-04 ENCOUNTER — Telehealth: Payer: Self-pay | Admitting: Family Medicine

## 2013-12-04 NOTE — Telephone Encounter (Signed)
lm

## 2014-01-26 ENCOUNTER — Other Ambulatory Visit (INDEPENDENT_AMBULATORY_CARE_PROVIDER_SITE_OTHER): Payer: Medicare Other

## 2014-01-26 DIAGNOSIS — Z23 Encounter for immunization: Secondary | ICD-10-CM

## 2014-03-23 ENCOUNTER — Encounter: Payer: Self-pay | Admitting: Family Medicine

## 2014-03-23 ENCOUNTER — Ambulatory Visit (INDEPENDENT_AMBULATORY_CARE_PROVIDER_SITE_OTHER): Payer: Medicare Other | Admitting: Family Medicine

## 2014-03-23 VITALS — BP 110/60 | HR 72 | Wt 150.0 lb

## 2014-03-23 DIAGNOSIS — B029 Zoster without complications: Secondary | ICD-10-CM

## 2014-03-23 MED ORDER — VALACYCLOVIR HCL 1 G PO TABS
1000.0000 mg | ORAL_TABLET | Freq: Three times a day (TID) | ORAL | Status: DC
Start: 2014-03-23 — End: 2015-01-05

## 2014-03-23 MED ORDER — HYDROCODONE-ACETAMINOPHEN 5-325 MG PO TABS: 1.0000 | ORAL_TABLET | Freq: Four times a day (QID) | ORAL | Status: DC | PRN

## 2014-03-23 NOTE — Progress Notes (Signed)
   Subjective:    Patient ID: Molly Ross, female    DOB: [birth year], 73 y.o.   MRN: [id number]  HPI She has a three-day history of a painful rash in the left gluteal area. She has a previous history of shingles and states this feels very much like that.   Review of Systems     Objective:   Physical Exam Erythematous slightly vesicular lesion is noted in the left buttock area. It is in the S. II nerve root area. No lesions were seen down the posterior aspect of her leg.      Assessment & Plan:  Shingles - Plan: valACYclovir (VALTREX) 1000 MG tablet, HYDROcodone-acetaminophen (NORCO/VICODIN) 5-325 MG per tablet  information given concerning shingles in the area. She is familiar since he has had it in the past. Explained the possibility of PHN.

## 2014-03-23 NOTE — Patient Instructions (Addendum)
Shingles Shingles (herpes zoster) is an infection that is caused by the same virus that causes chickenpox (varicella). The infection causes a painful skin rash and fluid-filled blisters, which eventually break open, crust over, and heal. It may occur in any area of the body, but it usually affects only one side of the body or face. The pain of shingles usually lasts about 1 month. However, some people with shingles may develop long-term (chronic) pain in the affected area of the body. Shingles often occurs many years after the person had chickenpox. It is more common:  In people older than 50 years.  In people with weakened immune systems, such as those with HIV, AIDS, or cancer.  In people taking medicines that weaken the immune system, such as transplant medicines.  In people under great stress. CAUSES  Shingles is caused by the varicella zoster virus (VZV), which also causes chickenpox. After a person is infected with the virus, it can remain in the person's body for years in an inactive state (dormant). To cause shingles, the virus reactivates and breaks out as an infection in a nerve root. The virus can be spread from person to person (contagious) through contact with open blisters of the shingles rash. It will only spread to people who have not had chickenpox. When these people are exposed to the virus, they may develop chickenpox. They will not develop shingles. Once the blisters scab over, the person is no longer contagious and cannot spread the virus to others. SYMPTOMS  Shingles shows up in stages. The initial symptoms may be pain, itching, and tingling in an area of the skin. This pain is usually described as burning, stabbing, or throbbing.In a few days or weeks, a painful red rash will appear in the area where the pain, itching, and tingling were felt. The rash is usually on one side of the body in a band or belt-like pattern. Then, the rash usually turns into fluid-filled blisters. They  will scab over and dry up in approximately 2 3 weeks. Flu-like symptoms may also occur with the initial symptoms, the rash, or the blisters. These may include:  Fever.  Chills.  Headache.  Upset stomach. DIAGNOSIS  Your caregiver will perform a skin exam to diagnose shingles. Skin scrapings or fluid samples may also be taken from the blisters. This sample will be examined under a microscope or sent to a lab for further testing. TREATMENT  There is no specific cure for shingles. Your caregiver will likely prescribe medicines to help you manage the pain, recover faster, and avoid long-term problems. This may include antiviral drugs, anti-inflammatory drugs, and pain medicines. HOME CARE INSTRUCTIONS   Take a cool bath or apply cool compresses to the area of the rash or blisters as directed. This may help with the pain and itching.   Only take over-the-counter or prescription medicines as directed by your caregiver.   Rest as directed by your caregiver.  Keep your rash and blisters clean with mild soap and cool water or as directed by your caregiver.  Do not pick your blisters or scratch your rash. Apply an anti-itch cream or numbing creams to the affected area as directed by your caregiver.  Keep your shingles rash covered with a loose bandage (dressing).  Avoid skin contact with:  Babies.   Pregnant women.   Children with eczema.   Elderly people with transplants.   People with chronic illnesses, such as leukemia or AIDS.   Wear loose-fitting clothing to help ease   the pain of material rubbing against the rash.  Keep all follow-up appointments with your caregiver.If the area involved is on your face, you may receive a referral for follow-up to a specialist, such as an eye doctor (ophthalmologist) or an ear, nose, and throat (ENT) doctor. Keeping all follow-up appointments will help you avoid eye complications, chronic pain, or disability.  SEEK IMMEDIATE MEDICAL  CARE IF:   You have facial pain, pain around the eye area, or loss of feeling on one side of your face.  You have ear pain or ringing in your ear.  You have loss of taste.  Your pain is not relieved with prescribed medicines.   Your redness or swelling spreads.   You have more pain and swelling.  Your condition is worsening or has changed.   You have a feveror persistent symptoms for more than 2 3 days.  You have a fever and your symptoms suddenly get worse. MAKE SURE YOU:  Understand these instructions.  Will watch your condition.  Will get help right away if you are not doing well or get worse. Document Released: 12/02/2005 Document Revised: 08/26/2012 Document Reviewed: 07/16/2012 [facility] Patient Information 2014 [place].  Use the Vicodin as needed you can also take  Aleve regularly first. Take 2 Aleve 2 or 3 times per day and if you need to then use the Vicodin. There is a question about infection, back otherwise let's let it run its course if you're down the road a month or 2 if still having pain come back

## 2014-10-19 ENCOUNTER — Other Ambulatory Visit (INDEPENDENT_AMBULATORY_CARE_PROVIDER_SITE_OTHER): Payer: Medicare Other

## 2014-10-19 DIAGNOSIS — Z23 Encounter for immunization: Secondary | ICD-10-CM

## 2014-12-01 ENCOUNTER — Other Ambulatory Visit: Payer: Self-pay | Admitting: Family Medicine

## 2014-12-01 DIAGNOSIS — Z9289 Personal history of other medical treatment: Secondary | ICD-10-CM

## 2014-12-14 ENCOUNTER — Ambulatory Visit: Payer: Medicare Other

## 2014-12-15 ENCOUNTER — Ambulatory Visit: Payer: Medicare Other

## 2015-01-05 ENCOUNTER — Ambulatory Visit (INDEPENDENT_AMBULATORY_CARE_PROVIDER_SITE_OTHER): Payer: Medicare Other | Admitting: Family Medicine

## 2015-01-05 ENCOUNTER — Other Ambulatory Visit: Payer: Self-pay | Admitting: *Deleted

## 2015-01-05 ENCOUNTER — Ambulatory Visit
Admission: RE | Admit: 2015-01-05 | Discharge: 2015-01-05 | Disposition: A | Discharge status: Home / Self Care | Payer: Medicare Other | Source: Ambulatory Visit | Attending: Family Medicine | Admitting: Family Medicine

## 2015-01-05 ENCOUNTER — Encounter: Payer: Self-pay | Admitting: Family Medicine

## 2015-01-05 ENCOUNTER — Telehealth: Payer: Self-pay | Admitting: Family Medicine

## 2015-01-05 VITALS — BP 128/70 | HR 60 | Temp 96.9°F | Ht 69.0 in | Wt 146.0 lb

## 2015-01-05 DIAGNOSIS — R079 Chest pain, unspecified: Secondary | ICD-10-CM

## 2015-01-05 DIAGNOSIS — R05 Cough: Secondary | ICD-10-CM

## 2015-01-05 DIAGNOSIS — R0781 Pleurodynia: Secondary | ICD-10-CM

## 2015-01-05 DIAGNOSIS — R059 Cough, unspecified: Secondary | ICD-10-CM

## 2015-01-05 DIAGNOSIS — J189 Pneumonia, unspecified organism: Secondary | ICD-10-CM

## 2015-01-05 DIAGNOSIS — J449 Chronic obstructive pulmonary disease, unspecified: Secondary | ICD-10-CM | POA: Diagnosis not present

## 2015-01-05 MED ORDER — LEVOFLOXACIN 500 MG PO TABS: 500.0000 mg | ORAL_TABLET | Freq: Every day | ORAL | Status: DC

## 2015-01-05 NOTE — Progress Notes (Signed)
Chief Complaint  Patient presents with  . Chest Pain    under right breast/rib area that started Tuesday night. She states that she "knows what pneumonia feels like and this could be it." Had rash yesterday afternoon on chest and has chest congestion with green mucus. Thinks she may have had fever yesterday afternoon but did not take temp.    Two nights ago she started with sharp pains on the right side of her chest when she breathes.  She chronically coughs just first thing in the morning (possible allergies to sister's cats).  Cough has gotten worse in the last couple of days, coughing up green phlegm, sometimes thick.  Denies head congestion, runny nose, sore throat.  Denies shortness of breath.  Hurts to take a deep breath.  Yesterday she was chilled all day, thinks she may have had fever in the afternoon. She started taking Mucinex-DM yesterday afternoon, only took one dose so far.  She has been taking ibuprofen for the pain--took 600mg  at bedtime last night, none yet today.  Whenever she gets an infection, she gets a rash across her neck, chest and face--blotchy, she had yesterday afternoon (when has a fever).  Denies rash in chest area where pain is.  Currently without rash.  PMH, PSH, SH, FH reviewed/updated  Outpatient Encounter Prescriptions as of 01/05/2015  Medication Sig  . Calcium Carbonate-Vitamin D (CALCIUM-VITAMIN D) 500-200 MG-UNIT per tablet Take 1 tablet by mouth 2 (two) times daily with a meal.    . dextromethorphan-guaiFENesin (MUCINEX DM) 30-600 MG per 12 hr tablet Take 1 tablet by mouth 2 (two) times daily.  . fish oil-omega-3 fatty acids 1000 MG capsule Take 2 g by mouth daily.  . [DISCONTINUED] FLUoxetine (PROZAC) 20 MG tablet Take 1 tablet (20 mg total) by mouth daily.  . [DISCONTINUED] HYDROcodone-acetaminophen (NORCO/VICODIN) 5-325 MG per tablet Take 1 tablet by mouth every 6 (six) hours as needed for moderate pain.  . [DISCONTINUED] valACYclovir (VALTREX) 1000 MG  tablet Take 1 tablet (1,000 mg total) by mouth 3 (three) times daily.   No Known Allergies  ROS:  See HPI.  No exertional chest pain (just pleuritic), palpitations, headache, dizziness, nausea, vomiting, diarrhea, swelling, bleeding, bruising.  Rash yesterday, resolved.    PHYSICAL EXAM: BP 128/70 mmHg  Pulse 60  Temp(Src) 96.9 F (36.1 C) (Tympanic)  Ht 5\' 9"  (1.753 m)  Wt 146 lb (66.225 kg)  BMI 21.55 kg/m2 Pleasant female, well-appearing  98% O2 sat Speaking easily in full sentences with only occasional cough HEENT:  PERRL, EOMI, conjunctiva clear. TM's and EAC's normal. Nasal mucosa is normal. OP is clear. Sinuses nontender Neck: no lymphadenopathy, thyromegaly or mass Heart: regular rate and rhythm Lungs: clear on the left.  ronchi and significantly decreased breath sounds on the right posteriorly, starting mid-way and entire lower lobe. No wheezes, no rales noted. Chest: nontender to palpation Abdomen: nontender Skin: no visible rashes Neuro: alert and oriented.  Cranial nerves intact.  Normal strength, gait Psych: normal mood, affect, hygiene and grooming  ASSESSMENT/PLAN:  Pleuritic chest pain - Plan: DG Chest 2 View  Pneumonia, organism unspecified - Plan: levofloxacin (LEVAQUIN) 500 MG tablet   Pleuritic chest pain with abnormal lung exam.  Suspect pneumonia, can't r/o effusion as well. Check CXR. Start levaquin. Continue Mucinex DM  Return or go to ER if increasing pain, shortness of breath, fever  F/u next week--(may cancel if 100% better, as long as f/u x-ray is done)

## 2015-01-05 NOTE — Telephone Encounter (Signed)
Pt called to get results of xray from today. She says she was told she should get results by lunch time today

## 2015-01-05 NOTE — Patient Instructions (Signed)
Go to [facility] now for x-ray of the chest. We will get back to you with the results around lunchtime. I suspect you have pneumonia. Start levaquin once daily. Continue to use ibuprofen as needed for pain--up to 800mg  three times daily with food.  You may also use tylenol if needed for pain or fever. Continue the Mucinex DM.  Return next week for re-check (if you are 100% better, you can cancel). If x-ray does show pneumonia, it is important to make sure to get a follow up x-ray. We will let you know when based on the results from today's x-ray.

## 2015-01-19 ENCOUNTER — Ambulatory Visit (INDEPENDENT_AMBULATORY_CARE_PROVIDER_SITE_OTHER): Payer: Medicare Other

## 2015-01-19 DIAGNOSIS — J189 Pneumonia, unspecified organism: Secondary | ICD-10-CM

## 2015-01-19 DIAGNOSIS — J984 Other disorders of lung: Secondary | ICD-10-CM | POA: Diagnosis not present

## 2015-01-19 DIAGNOSIS — Z1231 Encounter for screening mammogram for malignant neoplasm of breast: Secondary | ICD-10-CM

## 2015-01-19 DIAGNOSIS — R059 Cough, unspecified: Secondary | ICD-10-CM

## 2015-01-19 DIAGNOSIS — R079 Chest pain, unspecified: Secondary | ICD-10-CM

## 2015-01-19 DIAGNOSIS — J449 Chronic obstructive pulmonary disease, unspecified: Secondary | ICD-10-CM

## 2015-01-19 DIAGNOSIS — R05 Cough: Secondary | ICD-10-CM

## 2015-01-19 DIAGNOSIS — R918 Other nonspecific abnormal finding of lung field: Secondary | ICD-10-CM

## 2015-01-20 ENCOUNTER — Encounter: Payer: Self-pay | Admitting: Family Medicine

## 2015-01-20 ENCOUNTER — Telehealth: Payer: Self-pay | Admitting: Internal Medicine

## 2015-01-20 ENCOUNTER — Ambulatory Visit (INDEPENDENT_AMBULATORY_CARE_PROVIDER_SITE_OTHER): Payer: Medicare Other | Admitting: Family Medicine

## 2015-01-20 VITALS — BP 122/62 | HR 80 | Temp 98.3°F | Ht 69.0 in | Wt 143.0 lb

## 2015-01-20 DIAGNOSIS — R9389 Abnormal findings on diagnostic imaging of other specified body structures: Secondary | ICD-10-CM

## 2015-01-20 DIAGNOSIS — R938 Abnormal findings on diagnostic imaging of other specified body structures: Secondary | ICD-10-CM | POA: Diagnosis not present

## 2015-01-20 DIAGNOSIS — R5383 Other fatigue: Secondary | ICD-10-CM | POA: Diagnosis not present

## 2015-01-20 DIAGNOSIS — J189 Pneumonia, unspecified organism: Secondary | ICD-10-CM | POA: Diagnosis not present

## 2015-01-20 DIAGNOSIS — E559 Vitamin D deficiency, unspecified: Secondary | ICD-10-CM | POA: Diagnosis not present

## 2015-01-20 LAB — CBC WITH DIFFERENTIAL/PLATELET
Basophils Absolute: 0 10*3/uL (ref 0.0–0.1)
Basophils Relative: 0 % (ref 0–1)
Eosinophils Absolute: 0.1 10*3/uL (ref 0.0–0.7)
Eosinophils Relative: 1 % (ref 0–5)
HCT: 38 % (ref 36.0–46.0)
HEMOGLOBIN: 12.6 g/dL (ref 12.0–15.0)
LYMPHS ABS: 2 10*3/uL (ref 0.7–4.0)
Lymphocytes Relative: 14 % (ref 12–46)
MCH: 29.6 pg (ref 26.0–34.0)
MCHC: 33.2 g/dL (ref 30.0–36.0)
MCV: 89.2 fL (ref 78.0–100.0)
MONOS PCT: 7 % (ref 3–12)
MPV: 9.7 fL (ref 8.6–12.4)
Monocytes Absolute: 1 10*3/uL (ref 0.1–1.0)
NEUTROS PCT: 78 % — AB (ref 43–77)
Neutro Abs: 11.1 10*3/uL — ABNORMAL HIGH (ref 1.7–7.7)
PLATELETS: 330 10*3/uL (ref 150–400)
RBC: 4.26 MIL/uL (ref 3.87–5.11)
RDW: 13.4 % (ref 11.5–15.5)
WBC: 14.2 10*3/uL — ABNORMAL HIGH (ref 4.0–10.5)

## 2015-01-20 LAB — COMPREHENSIVE METABOLIC PANEL
ALBUMIN: 4.3 g/dL (ref 3.5–5.2)
ALT: 8 U/L (ref 0–35)
AST: 15 U/L (ref 0–37)
Alkaline Phosphatase: 85 U/L (ref 39–117)
BUN: 15 mg/dL (ref 6–23)
CHLORIDE: 102 meq/L (ref 96–112)
CO2: 31 meq/L (ref 19–32)
CREATININE: 0.64 mg/dL (ref 0.50–1.10)
Calcium: 9.5 mg/dL (ref 8.4–10.5)
GLUCOSE: 116 mg/dL — AB (ref 70–99)
Potassium: 4.6 mEq/L (ref 3.5–5.3)
SODIUM: 140 meq/L (ref 135–145)
TOTAL PROTEIN: 6.8 g/dL (ref 6.0–8.3)
Total Bilirubin: 1.3 mg/dL — ABNORMAL HIGH (ref 0.2–1.2)

## 2015-01-20 NOTE — Progress Notes (Signed)
Chief Complaint  Patient presents with  . Follow-up    on CXR-patient states she feels a little bit better.    Seen 1/21 with pleuritic right-sided chest pain. Diagnosed with pneumonia and treated with levaquin and Mucinex DM.  The right sided pain has significantly improved--sometimes if she lies on that side or breathes "hard" she has a residual discomfort.  She stayed out of work until last Friday.  She has been back at work, and running around doing errands, and feeling much better overall.  No further fevers.  She completed the course of Levaquin 6 days ago.  Some cough just in the mornings, clear phlegm.  The discolored phlegm completely resolved. She was out with her sister yesterday, and was lifting the wheelchair in and out frequently--she felt more winded than usual for doing this activity. She had f/u CXR done yesterday, and was asked to return today for re-evaluation  PMH, PSH, reviewed/updated History   Social History  . Marital Status: Widowed    Spouse Name: N/A    Number of Children: N/A  . Years of Education: N/A   Occupational History  . Not on file.   Social History Main Topics  . Smoking status: Former Smoker    Quit date: 06/28/1980  . Smokeless tobacco: Never Used     Comment: smoked 1-2 PPD x 20 years  . Alcohol Use: Yes     Comment: socially 1 glass of wine 1-2 times monthly  . Drug Use: Not on file  . Sexual Activity: Not on file   Other Topics Concern  . Not on file   Social History Narrative   Lives with [name] (her sister).  Widowed (2011).  Son lives in [place], granddaughter lives in [place] (with her mother).   Also has step grandson.   She works at the [employer] in [place] Prescriptions as of 01/20/2015  Medication Sig  . Calcium Carbonate-Vitamin D (CALCIUM-VITAMIN D) 500-200 MG-UNIT per tablet Take 1 tablet by mouth 2 (two) times daily with a meal.    . dextromethorphan-guaiFENesin (MUCINEX DM)  30-600 MG per 12 hr tablet Take 1 tablet by mouth 2 (two) times daily.  . fish oil-omega-3 fatty acids 1000 MG capsule Take 2 g by mouth daily.  Marland Kitchen levofloxacin (LEVAQUIN) 500 MG tablet Take 1 tablet (500 mg total) by mouth daily. (Patient not taking: Reported on 01/20/2015)   No Known Allergies  ROS:  Denies nausea, vomiting, diarrhea, shortness of breath, fevers, chills. No sinus/head congestions, sore throat or other URI symptoms. No bleeding, bruising, rash, depression or other concerns.  See HPI.  PHYSICAL EXAM: BP 122/62 mmHg  Pulse 80  Temp(Src) 98.3 F (36.8 C) (Tympanic)  Ht '5\' 9"'  (1.753 m)  Wt 143 lb (64.864 kg)  BMI 21.11 kg/m2 Well developed, well-appearing, pleasant female in no distress. No coughing, and speaks easily in full sentences HEENT: PERRL, EOMI, conjunctiva clear.  OP clear Neck: No lymphadenopathy or mass Heart: regular rate and rhythm Lungs: Slight crackle/coarseness at the right  Mid-lower lung field.  Good air movement--significantly improved over last exam on 1/21. Improved air movement, and no longer has significant ronchi Extremities: no edema Neuro: alert and oriented, cranial nerves intact. Normal strength, gait Psych: normal mood, affect, hygiene and grooming  Chart reviewed--no labs since 07/2013, and only CBC done, which was when she had pna, and WBC was high. No chem panels in chart. Pt is nonfasting today.  CXR from 01/19/15:  FINDINGS: There is persistent increased density in the lingula. These small air-fluid level previously demonstrated persists. On the right there is patchy increased infrahilar density that is stable. The interstitial markings of both lungs remain increased. There is apical pleural thickening bilaterally which is stable. The cardiac silhouette is normal in size. The pulmonary vascularity is not engorged. There is no pleural effusion. The bony thorax is unremarkable.  IMPRESSION: COPD with parenchymal scarring. Superimposed  pneumonia is present in the lingula and and right middle lobe little changed from the previous study. A lingular cavitary/bullous lesion persists.  Chest CT scanning now is recommended.  ASSESSMENT/PLAN:  Pneumonia, organism unspecified - clinically resolving--exam improved (but not completely normal); still with slight dyspnea; pain resolved.  CXR unchanged, remains abnl - Plan: CBC with Differential/Platelet, CT Chest W Contrast  Abnormal chest x-ray - Plan: Comprehensive metabolic panel, CT Chest W Contrast  Other fatigue - Plan: Comprehensive metabolic panel, CBC with Differential/Platelet, Vit D  25 hydroxy (rtn osteoporosis monitoring), TSH   CBC, c-met, TSH, vit D (since never checked) Nonfasting, so lipids not drawn  If WBC is high, then put on another ABX while awaiting CT If WBC is normal, given improvement in clinical history as well as exam, can wait for CT  CT early next week

## 2015-01-20 NOTE — Patient Instructions (Signed)
We will be setting up CT of chest.  If your white count on your labs today is elevated, suggesting ongoing infection, then we will change you to another antibiotic.  If it is normal, since your exam is much improved, I will await the CT results before deciding on if additional antibiotics are needed.

## 2015-01-20 NOTE — Telephone Encounter (Signed)
Radiology called to give a call report on pt that had a chest xray done. Results are in computer

## 2015-01-21 LAB — TSH: TSH: 0.977 u[IU]/mL (ref 0.350–4.500)

## 2015-01-21 LAB — VITAMIN D 25 HYDROXY (VIT D DEFICIENCY, FRACTURES): VIT D 25 HYDROXY: 17 ng/mL — AB (ref 30–100)

## 2015-01-22 ENCOUNTER — Encounter: Payer: Self-pay | Admitting: Family Medicine

## 2015-01-23 ENCOUNTER — Other Ambulatory Visit: Payer: Self-pay | Admitting: *Deleted

## 2015-01-23 MED ORDER — CLARITHROMYCIN 500 MG PO TABS: 500.0000 mg | ORAL_TABLET | Freq: Two times a day (BID) | ORAL | Status: DC

## 2015-01-23 MED ORDER — ERGOCALCIFEROL 1.25 MG (50000 UT) PO CAPS: 50000.0000 [IU] | ORAL_CAPSULE | ORAL | Status: DC

## 2015-01-26 ENCOUNTER — Ambulatory Visit
Admission: RE | Admit: 2015-01-26 | Discharge: 2015-01-26 | Disposition: A | Discharge status: Home / Self Care | Payer: Medicare Other | Source: Ambulatory Visit | Attending: Family Medicine | Admitting: Family Medicine

## 2015-01-26 ENCOUNTER — Telehealth: Payer: Self-pay | Admitting: *Deleted

## 2015-01-26 DIAGNOSIS — J439 Emphysema, unspecified: Secondary | ICD-10-CM | POA: Diagnosis not present

## 2015-01-26 DIAGNOSIS — R9389 Abnormal findings on diagnostic imaging of other specified body structures: Secondary | ICD-10-CM

## 2015-01-26 DIAGNOSIS — J189 Pneumonia, unspecified organism: Secondary | ICD-10-CM

## 2015-01-26 MED ORDER — IOHEXOL 300 MG/ML  SOLN
75.0000 mL | Freq: Once | INTRAMUSCULAR | Status: AC | PRN
  Administered 2015-01-26: 75 mL via INTRAVENOUS

## 2015-01-26 NOTE — Telephone Encounter (Signed)
ordered

## 2015-01-26 NOTE — Telephone Encounter (Signed)
Spoke with patient and went over CT results. She was agreeable to noncontrast CT in 3 months. I would like to know if you can schedule this as there are so many things in the impression and I am not sure what to use for diagnosis, etc. I will schedule once ordered-thank you.

## 2015-02-01 ENCOUNTER — Telehealth: Payer: Self-pay | Admitting: Family Medicine

## 2015-02-01 ENCOUNTER — Telehealth: Payer: Self-pay | Admitting: *Deleted

## 2015-02-01 NOTE — Telephone Encounter (Signed)
Patient advised, she will be here tomorrow.

## 2015-02-01 NOTE — Telephone Encounter (Signed)
Pt says she is on the last day of her antibiotic and she still has sore throat. Also she says she was dx'd with emphysema last week and want to know treatment plan for this and when she need to be seen again. Pt can be reached at her work # today @ [phone] until 5:00pm then her cell # @ [phone]

## 2015-02-01 NOTE — Telephone Encounter (Signed)
She can do salt water gargles, warm or cold liquids, whichever feels better.  Tylenol or ibuprofen as needed for pain.  If she is feeling worse, any recurrent fever or other concerns, then keep the appointment tomorrow afternoon

## 2015-02-01 NOTE — Telephone Encounter (Signed)
She may not need any specific treatment for emphysema.  Will need PFT's, but awaiting to get our machine.  We can address all questions at her appointment.  It had been recommended to have her f/u if symptoms hadn't completely resolved, so just have her keep tomorrow's appointment.

## 2015-02-01 NOTE — Telephone Encounter (Signed)
Patient called and states that she woke up with ST this morning, it is worsening as the day progresses (very hoarse). Takes her last abx tablet tonight. Did you want to see her tomorrow? I scheduled her for 3:15pm, just in case as appts were scarce tomorrow. Please advise.

## 2015-02-02 ENCOUNTER — Encounter: Payer: Self-pay | Admitting: Family Medicine

## 2015-02-02 ENCOUNTER — Ambulatory Visit (INDEPENDENT_AMBULATORY_CARE_PROVIDER_SITE_OTHER): Payer: Medicare Other | Admitting: Family Medicine

## 2015-02-02 VITALS — BP 124/64 | HR 84 | Temp 100.0°F | Ht 69.0 in | Wt 145.0 lb

## 2015-02-02 DIAGNOSIS — K449 Diaphragmatic hernia without obstruction or gangrene: Secondary | ICD-10-CM | POA: Insufficient documentation

## 2015-02-02 DIAGNOSIS — J029 Acute pharyngitis, unspecified: Secondary | ICD-10-CM | POA: Diagnosis not present

## 2015-02-02 DIAGNOSIS — J439 Emphysema, unspecified: Secondary | ICD-10-CM

## 2015-02-02 DIAGNOSIS — J069 Acute upper respiratory infection, unspecified: Secondary | ICD-10-CM | POA: Diagnosis not present

## 2015-02-02 DIAGNOSIS — I700 Atherosclerosis of aorta: Secondary | ICD-10-CM

## 2015-02-02 DIAGNOSIS — J189 Pneumonia, unspecified organism: Secondary | ICD-10-CM

## 2015-02-02 HISTORY — DX: Diaphragmatic hernia without obstruction or gangrene: K44.9

## 2015-02-02 HISTORY — DX: Atherosclerosis of aorta: I70.0

## 2015-02-02 HISTORY — DX: Emphysema, unspecified: J43.9

## 2015-02-02 LAB — POCT RAPID STREP A (OFFICE): Rapid Strep A Screen: NEGATIVE

## 2015-02-02 NOTE — Progress Notes (Signed)
Chief Complaint  Patient presents with  . Sore Throat    started yesterday morning and has worsened. Malinda's caregiver is sick and she has had another sick contact that is in the hospital.    She completed the Biaxin last night (second round of antibiotics for R sided pneumonia).  She overall feels better, but if she lays on her right side she sometimes has discomfort.  Cough never completely resolved, but got worse since she got another illness (see below).  Denies shortness of breath, +fatigue.  She woke up yesterday with a sore throat, and she felt worse throughout the day.  Started with runny nose and worsening cough over the night last night.  She felt like she had lowgrade fever start last night (no fevers prior to yesterday).   Nasal mucus is clear.  Coughing up yellow-green mucus (small amount, just today).  Denies ear pain, sinus pain.  No headaches.  She has been gargling with warm salt water, which helps some.  Hasn't taken any other OTC medications.  +sick contacts--Malinda's caregiver.  She smoked 24 years, quit 35 years ago.  She smoked 1-2 PPD.  PMH, PSH, SH reviewed. Outpatient Encounter Prescriptions as of 02/02/2015  Medication Sig  . Calcium Carbonate-Vitamin D (CALCIUM-VITAMIN D) 500-200 MG-UNIT per tablet Take 1 tablet by mouth 2 (two) times daily with a meal.    . ergocalciferol (VITAMIN D2) 50000 UNITS capsule Take 1 capsule (50,000 Units total) by mouth once a week.  . fish oil-omega-3 fatty acids 1000 MG capsule Take 2 g by mouth daily.  . Multiple Vitamins-Minerals (MULTIVITAMIN WITH MINERALS) tablet Take 1 tablet by mouth daily.  . clarithromycin (BIAXIN) 500 MG tablet Take 1 tablet (500 mg total) by mouth 2 (two) times daily. (Patient not taking: Reported on 02/02/2015)  . dextromethorphan-guaiFENesin (MUCINEX DM) 30-600 MG per 12 hr tablet Take 1 tablet by mouth 2 (two) times daily.  . [DISCONTINUED] levofloxacin (LEVAQUIN) 500 MG tablet Take 1 tablet (500 mg  total) by mouth daily. (Patient not taking: Reported on 01/20/2015)   No Known Allergies  ROS:  Low grade fevers starting last night/today. Denies nausea, vomiting, diarrhea. She sometimes reports very large burps.  No heartburn. +fatigue, sore throat, congestion, cough. No shortness of breath.  No chest pain, palpitations, bleeding, bruising, rash.  See HPI.  PHYSICAL EXAM: BP 124/64 mmHg  Pulse 84  Temp(Src) 100 F (37.8 C)  Ht 5\' 9"  (1.753 m)  Wt 145 lb (65.772 kg)  BMI 21.40 kg/m2  Mildly ill appearing female with hoarse voice. Not coughing.  In no distress, just appears tired. HEENT: PERRL, EOMI, conjunctiva clear.  TM's and EACs normal.  OP red posteriorly; no ulcerations, exudate or other focal abnormality. Nasal mucosa mildly edematous with some clear mucus on the left. Sinuses are nontender Heart: regular rate and rhythm Lungs: Some coarse breath sounds and slight crackles noted at right posterior lung fields (middle and base)--significantly improved compared to prior visit, with improved air movement. No wheezes Abdomen: soft, nontender Extremities: no edema Skin: no rash  CXR's and chest CT reviewed in detail with patient. CT result: IMPRESSION: Underlying emphysema with areas of scarring and peripheral mucous plugging. Mild airspace consolidation noted in the inferior lingula, inferior anterior right middle lobe, and posterior segment right upper lobe. There is a some os solid appearing opacity and anterior segment of the right upper lobe which could represent localized airspace disease but also could represent atypical neoplasm given the semisolid nature. Given this  finding, a followup noncontrast enhanced study in 3 months is warranted.  Note that there is no cavitary lesion in the lingula as was questioned on chest radiography. The area questioned on chest radiography is felt to represent localized emphysema adjacent to the left heart border. No air-fluid levels  are noted on this study.  Granuloma superior segment left lower lobe.  Small lymph nodes without frank adenopathy.   Electronically Signed  By: Lowella Grip III M.D.  On: 01/26/2015 10:27             ASSESSMENT/PLAN:  Sore throat - Plan: Rapid Strep A  Acute upper respiratory infection  Pulmonary emphysema, unspecified emphysema type  Atherosclerosis of aorta  Hiatal hernia - small, noted on CT of chest  Pneumonia, organism unspecified - completed Biaxin. clinically improving (with now overlying URI)   Drink plenty of fluids. Use tylenol or ibuprofen as needed for pain (sore throat) and/or fever Continue with salt water gargles, chloraseptic spray as needed. Use sudafed or dayquil as needed for sinus drainage and congestion. Restart Mucinex. Expect to feel bad for another 2-4 days, then it should start to turn the corner, and improve (usually by 7-10 days of illness  Plan is to check pulmonary function tests when you are well (not sick with an illness like today).  We should be getting our machine set up within the next couple of weeks.  Recheck CT scan in 3 months, but return sooner if any recurrent pain, cough, fever or other concerns.  HH--minimally symptomatic.  Discussed diagnosis, treatment. Plan is to get PFT's when well, f/u CT 3 months.  Based on results, determine if pulm consult or any other treatment is needed. Prior to pneumonia, she has been asymptomatic with respect to the emphysematous changes noted.  All questions answered.

## 2015-02-02 NOTE — Patient Instructions (Signed)
  Drink plenty of fluids. Use tylenol or ibuprofen as needed for pain (sore throat) and/or fever Continue with salt water gargles, chloraseptic spray as needed. Use sudafed or dayquil as needed for sinus drainage and congestion. Restart Mucinex. Expect to feel bad for another 2-4 days, then it should start to turn the corner, and improve (usually by 7-10 days of illness  Plan is to check pulmonary function tests when you are well (not sick with an illness like today).  We should be getting our machine set up within the next couple of weeks.  Recheck CT scan in 3 months, but return sooner if any recurrent pain, cough, fever or other concerns.

## 2015-03-06 ENCOUNTER — Ambulatory Visit (INDEPENDENT_AMBULATORY_CARE_PROVIDER_SITE_OTHER): Payer: Medicare Other | Admitting: Family Medicine

## 2015-03-06 ENCOUNTER — Encounter: Payer: Self-pay | Admitting: Family Medicine

## 2015-03-06 VITALS — BP 120/60 | HR 80 | Ht 69.0 in | Wt 145.6 lb

## 2015-03-06 DIAGNOSIS — J449 Chronic obstructive pulmonary disease, unspecified: Secondary | ICD-10-CM | POA: Diagnosis not present

## 2015-03-06 DIAGNOSIS — J439 Emphysema, unspecified: Secondary | ICD-10-CM

## 2015-03-06 MED ORDER — FLUTICASONE FUROATE-VILANTEROL 100-25 MCG/INH IN AEPB: 1.0000 | INHALATION_SPRAY | Freq: Every day | RESPIRATORY_TRACT | Status: DC

## 2015-03-06 NOTE — Patient Instructions (Signed)
Start the [employer] inhaler.  Use it once daily (1 inhalation). Let us know if you have problems. Return in 3 months to review CT results, possibly repeat the PFT's and to see how you're doing with the new inhaler

## 2015-03-06 NOTE — Progress Notes (Signed)
Chief Complaint  Patient presents with  . PFT    patient is feeling well.   Cough and congestion finally resolved last week, after having another URI after her bout of pneumonia.  She presents today for PFT given the findings on CXR and CT of COPD.  She reports feeling well, but during visit was noted to be coughing.  She states she didn't realize it, but she usually keeps somewhat of a cough. Denies runny nose, fevers, shortness of breath, chest pain or other concerns.  CT from 01/2015: IMPRESSION: Underlying emphysema with areas of scarring and peripheral mucous plugging. Mild airspace consolidation noted in the inferior lingula, inferior anterior right middle lobe, and posterior segment right upper lobe. There is a some os solid appearing opacity and anterior segment of the right upper lobe which could represent localized airspace disease but also could represent atypical neoplasm given the semisolid nature. Given this finding, a followup noncontrast enhanced study in 3 months is warranted.  Note that there is no cavitary lesion in the lingula as was questioned on chest radiography. The area questioned on chest radiography is felt to represent localized emphysema adjacent to the left heart border. No air-fluid levels are noted on this study.  Granuloma superior segment left lower lobe.  Small lymph nodes without frank adenopathy.   ROS:  No fevers, chills, headaches, dizziness.  No URI symptoms. She denies any cough, shortness of breath. No painful breathing, fevers, chills Nausea, vomiting, diarrhea, rash, bleeding, bruising   PHYSICAL EXAM: BP 120/60 mmHg  Pulse 80  Ht 5\' 9"  (1.753 m)  Wt 145 lb 9.6 oz (66.044 kg)  BMI 21.49 kg/m2 Well appearing, pleasant female with intermittent deep cough.  Speaking easily in full sentences Heart: regular rate and rhythm Lungs: fair air movement, somewhat diminished throughout, but especially at Right mid lung--decreased air  movement and slight crackle residual noted. Skin: no rash Psych: normal mood, affect, hygiene and grooming Neuro: alert and oriented. Normal strength, gait.  PFT--showing severe airway obstruction with low FVC   ASSESSMENT/PLAN:  Pulmonary emphysema, unspecified emphysema type - Plan: Spirometry with Graph, Spirometry with Graph  Chronic obstructive pulmonary disease, unspecified COPD, unspecified chronic bronchitis type - Plan: Fluticasone Furoate-Vilanterol (BREO ELLIPTA) 100-25 MCG/INH AEPB  Given history of some ongoing/chronic cough, and abnormal PFT's, will start her on treatment for COPD. She will get her CT as scheduled for 3 months, and try the inhaler for 3 months.  Consider repeating PFT's at that visit (after being on treatment).  F/u CT scheduled for May Start Breo Ellipta 100/25 1 puff once daily  She was given 2 samples (28 each), and card for free 1 month trial.  Voucher for add'l free inhalers likely won't be allowed due to [facility]

## 2015-04-11 ENCOUNTER — Other Ambulatory Visit: Payer: Self-pay | Admitting: Family Medicine

## 2015-04-11 NOTE — Telephone Encounter (Signed)
Left detailed message on vm to take 100 uit daily of vitamin d3

## 2015-04-11 NOTE — Telephone Encounter (Signed)
No--she is only supposed to take a 12 week course. Then she needs to start taking 1000 IU of Vitamin D3 (OTC) once daily, long-term.  Please remind her to start this vitamin in place of the prescription

## 2015-04-11 NOTE — Telephone Encounter (Signed)
Is this okay to refill this?

## 2015-04-22 ENCOUNTER — Other Ambulatory Visit: Payer: Self-pay | Admitting: Family Medicine

## 2015-04-26 ENCOUNTER — Ambulatory Visit
Admission: RE | Admit: 2015-04-26 | Discharge: 2015-04-26 | Disposition: A | Discharge status: Home / Self Care | Payer: Medicare Other | Source: Ambulatory Visit | Attending: Family Medicine | Admitting: Family Medicine

## 2015-04-26 DIAGNOSIS — R938 Abnormal findings on diagnostic imaging of other specified body structures: Secondary | ICD-10-CM | POA: Diagnosis not present

## 2015-04-26 DIAGNOSIS — J432 Centrilobular emphysema: Secondary | ICD-10-CM | POA: Diagnosis not present

## 2015-04-26 DIAGNOSIS — R9389 Abnormal findings on diagnostic imaging of other specified body structures: Secondary | ICD-10-CM

## 2015-04-26 DIAGNOSIS — I2510 Atherosclerotic heart disease of native coronary artery without angina pectoris: Secondary | ICD-10-CM | POA: Diagnosis not present

## 2015-04-27 ENCOUNTER — Other Ambulatory Visit: Payer: Self-pay | Admitting: *Deleted

## 2015-04-27 DIAGNOSIS — R918 Other nonspecific abnormal finding of lung field: Secondary | ICD-10-CM

## 2015-04-27 DIAGNOSIS — J449 Chronic obstructive pulmonary disease, unspecified: Secondary | ICD-10-CM

## 2015-04-27 DIAGNOSIS — R079 Chest pain, unspecified: Secondary | ICD-10-CM

## 2015-04-27 MED ORDER — FLUTICASONE FUROATE-VILANTEROL 100-25 MCG/INH IN AEPB: 1.0000 | INHALATION_SPRAY | Freq: Every day | RESPIRATORY_TRACT | Status: DC

## 2015-06-05 ENCOUNTER — Ambulatory Visit (INDEPENDENT_AMBULATORY_CARE_PROVIDER_SITE_OTHER): Payer: Medicare Other | Admitting: Family Medicine

## 2015-06-05 ENCOUNTER — Encounter: Payer: Self-pay | Admitting: Family Medicine

## 2015-06-05 VITALS — BP 122/70 | HR 68 | Ht 69.0 in | Wt 145.2 lb

## 2015-06-05 DIAGNOSIS — J449 Chronic obstructive pulmonary disease, unspecified: Secondary | ICD-10-CM

## 2015-06-05 DIAGNOSIS — E559 Vitamin D deficiency, unspecified: Secondary | ICD-10-CM | POA: Diagnosis not present

## 2015-06-05 DIAGNOSIS — D72829 Elevated white blood cell count, unspecified: Secondary | ICD-10-CM | POA: Diagnosis not present

## 2015-06-05 DIAGNOSIS — Z13220 Encounter for screening for lipoid disorders: Secondary | ICD-10-CM | POA: Diagnosis not present

## 2015-06-05 DIAGNOSIS — I700 Atherosclerosis of aorta: Secondary | ICD-10-CM | POA: Diagnosis not present

## 2015-06-05 DIAGNOSIS — R079 Chest pain, unspecified: Secondary | ICD-10-CM

## 2015-06-05 DIAGNOSIS — R7309 Other abnormal glucose: Secondary | ICD-10-CM | POA: Diagnosis not present

## 2015-06-05 DIAGNOSIS — R938 Abnormal findings on diagnostic imaging of other specified body structures: Secondary | ICD-10-CM

## 2015-06-05 DIAGNOSIS — R9389 Abnormal findings on diagnostic imaging of other specified body structures: Secondary | ICD-10-CM

## 2015-06-05 LAB — LIPID PANEL
CHOLESTEROL: 180 mg/dL (ref 0–200)
HDL: 76 mg/dL (ref >=46)
LDL Cholesterol: 95 mg/dL (ref 0–99)
TRIGLYCERIDES: 45 mg/dL (ref <150)
Total CHOL/HDL Ratio: 2.4 Ratio
VLDL: 9 mg/dL (ref 0–40)

## 2015-06-05 LAB — CBC WITH DIFFERENTIAL/PLATELET
BASOS ABS: 0 10*3/uL (ref 0.0–0.1)
BASOS PCT: 0 % (ref 0–1)
EOS ABS: 0.1 10*3/uL (ref 0.0–0.7)
Eosinophils Relative: 1 % (ref 0–5)
HCT: 40.9 % (ref 36.0–46.0)
Hemoglobin: 13.6 g/dL (ref 12.0–15.0)
Lymphocytes Relative: 17 % (ref 12–46)
Lymphs Abs: 2.2 10*3/uL (ref 0.7–4.0)
MCH: 29.1 pg (ref 26.0–34.0)
MCHC: 33.3 g/dL (ref 30.0–36.0)
MCV: 87.6 fL (ref 78.0–100.0)
MONO ABS: 1.1 10*3/uL — AB (ref 0.1–1.0)
MONOS PCT: 8 % (ref 3–12)
MPV: 9.8 fL (ref 8.6–12.4)
NEUTROS PCT: 74 % (ref 43–77)
Neutro Abs: 9.8 10*3/uL — ABNORMAL HIGH (ref 1.7–7.7)
PLATELETS: 325 10*3/uL (ref 150–400)
RBC: 4.67 MIL/uL (ref 3.87–5.11)
RDW: 13.3 % (ref 11.5–15.5)
WBC: 13.2 10*3/uL — ABNORMAL HIGH (ref 4.0–10.5)

## 2015-06-05 LAB — GLUCOSE, RANDOM: GLUCOSE: 80 mg/dL (ref 70–99)

## 2015-06-05 NOTE — Patient Instructions (Addendum)
Look into cost of Advair or Symbicort. These are similar combination medications, that are supposed to be used twice daily.  Let me know if any of these are less expensive, and we can change the prescription. The pulmonologists can also weigh in on these medications when you see them.  Start taking 81mg  of enteric coated aspirin every day, given that we see atheroscerotic changes in the heart.  We will be checking your cholesterol to assess other risk factors for heart disease. Your blood pressure is excellent.  We will be in touch with your Vitamin D results, and let you know if you should continue 2000 IU daily, vs change. You should make sure that you get 1200mg  of calcium every day, in combination from your diet and all supplements/vitamins.  Do not take it all at once, needs to be divided (no more than 600mg  of calcium at one time).  It is best to get most through diet, and make up the rest with a supplement.

## 2015-06-05 NOTE — Progress Notes (Signed)
Chief Complaint  Patient presents with  . Follow-up    on CT and repeat of PFT's after starting Breo. Cost of [facility] is $189 per month, inquiring as to whether there is something less expensive that he could take. Referall was put in and [facility] attempted 3 x to set up, patient didn't recognize number and they did not leave a message-referral order in system was canceled by them.    COPD:  She has been using Breo--only every other day due to cost.  She is no longer waking up in the mornings coughing.  She no longer tiring when walking to get her paper like she was before.  Overall, breathing is much improved.  No shortness of breath.  Coughs some, but overall improved.  Cough is nonproductive.  Denies fevers, chills, night sweats.  She had 3 month f/u CT in May, and presents to review the results.  She was referred to pulmonary, but appointment hasn't been scheduled (see above). IMPRESSION: 1. Resolution of previously noted sub solid right upper lobe nodule, indicative of an infectious or inflammatory nodule on the prior study. 2. 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 3. Atherosclerosis, including left main and 2 vessel coronary artery disease. Assessment for potential risk factor modification, dietary therapy or pharmacologic therapy may be warranted, if clinically indicated.  Vitamin D deficiency--she completed 12 weeks of prescription vitamin D, and has been taking 2000 IU of Vitamin D3 in addition to her MVI.  She is NOT taking Calcium with D. Has cereal a few times a week, and some cheese, but not a few servings daily containing calcium.  She is fasting today.  Has not had fasting labs in quite a while.  Glucose of 116 in February's chem panel was NON fasting.  PMH, PSH, SH reviewed.  Outpatient Encounter Prescriptions as of 06/05/2015   Medication Sig Note  . Cholecalciferol (VITAMIN D) 2000 UNITS tablet Take 2,000 Units by mouth daily.   . fish oil-omega-3 fatty acids 1000 MG capsule Take 2 g by mouth daily.   . Fluticasone Furoate-Vilanterol (BREO ELLIPTA) 100-25 MCG/INH AEPB Inhale 1 puff into the lungs daily. 06/05/2015: Is using 1 puff every other day due to cost  . Multiple Vitamins-Minerals (MULTIVITAMIN WITH MINERALS) tablet Take 1 tablet by mouth daily.   . [DISCONTINUED] Calcium Carbonate-Vitamin D (CALCIUM-VITAMIN D) 500-200 MG-UNIT per tablet Take 1 tablet by mouth 2 (two) times daily with a meal.     . [DISCONTINUED] ergocalciferol (VITAMIN D2) 50000 UNITS capsule Take 1 capsule (50,000 Units total) by mouth once a week.    No facility-administered encounter medications on file as of 06/05/2015.   No Known Allergies  ROS: no fever, chills, headaches, dizziness, chest pain, palpitations. Denies URI symptoms.  Cough and dyspnea improved as per HPI.  Occasional R ankle swelling since h/o fracture. Moods are good.  See HPI  PHYSICAL EXAM: BP 122/70 mmHg  Pulse 68  Ht 5\' 9"  (1.753 m)  Wt 145 lb 3.2 oz (65.862 kg)  BMI 21.43 kg/m2 Well developed, pleasant female in no distress.  No coughing, speaks easily without distress or dyspnea HEENT: PERRL, EOMi, conjunctiva clear. OP clear Neck: No lymphadenopathy, thyromegaly or mass Prominent R [place] joint (nontender). Heart: regular rate and rhythm Lungs: fair air movement throughout. No wheezes, rales or ronchi.  Slightly coarse at right base Abdomen: soft, nontender, no organomegaly or  mass Extremities: no edema, 2+ pulse Skin: no lesions, rash, normal turgor Psych: normal mood, affect, hygiene and grooming Neuro: alert and oriented. Cranial nerves intact.  Normal strength, gait  ASSESSMENT/PLAN:  Obstructive chronic bronchitis without exacerbation - slight improvement noted on PFTs since starting Breo--needs daily use.  Check w/insurance re: cost of similar meds;  f/u pulmonary - Plan: Spirometry with graph, Ambulatory referral to Pulmonology, CANCELED: Spirometry with graph  Atherosclerosis of aorta - assess risks. start ASA 81mg  daily - Plan: Lipid panel  Vitamin D deficiency - reviewed calcium and vitamin D recommendations, as well as weight bearing exercise - Plan: Vit D  25 hydroxy (rtn osteoporosis monitoring)  Elevated glucose - (elevated when nonfasting--needs fasting sugar as routine screen for DM) - Plan: Glucose, random  Screening for lipid disorders - Plan: Lipid panel  Elevated WBC count - Plan: CBC with Differential/Platelet  Abnormal CT scan - Plan: Ambulatory referral to Pulmonology  Chest pain, unspecified chest pain type - Plan: Ambulatory referral to Pulmonology   Start taking 81mg  of enteric coated aspirin every day, given that we see atheroscerotic changes in the heart.  We will be checking your cholesterol to assess other risk factors for heart disease. Your blood pressure is excellent.   Lipid, glucose, vitamin D, CBC (to f/u elevated WBC)  Refer back to pulmonary for abnormal CT. Request to call work number in referral [phone]

## 2015-06-06 LAB — VITAMIN D 25 HYDROXY (VIT D DEFICIENCY, FRACTURES): VIT D 25 HYDROXY: 35 ng/mL (ref 30–100)

## 2015-06-09 ENCOUNTER — Ambulatory Visit (INDEPENDENT_AMBULATORY_CARE_PROVIDER_SITE_OTHER): Payer: Medicare Other | Admitting: Internal Medicine

## 2015-06-09 ENCOUNTER — Encounter: Payer: Self-pay | Admitting: Internal Medicine

## 2015-06-09 VITALS — BP 130/62 | HR 79 | Ht 69.0 in | Wt 142.0 lb

## 2015-06-09 DIAGNOSIS — Z87891 Personal history of nicotine dependence: Secondary | ICD-10-CM

## 2015-06-09 DIAGNOSIS — J479 Bronchiectasis, uncomplicated: Secondary | ICD-10-CM

## 2015-06-09 DIAGNOSIS — J471 Bronchiectasis with (acute) exacerbation: Secondary | ICD-10-CM

## 2015-06-09 HISTORY — DX: Personal history of nicotine dependence: Z87.891

## 2015-06-09 HISTORY — DX: Bronchiectasis with (acute) exacerbation: J47.1

## 2015-06-09 MED ORDER — MOMETASONE FURO-FORMOTEROL FUM 200-5 MCG/ACT IN AERO: 2.0000 | INHALATION_SPRAY | Freq: Two times a day (BID) | RESPIRATORY_TRACT | Status: DC

## 2015-06-09 NOTE — Progress Notes (Signed)
Subjective:    Patient ID: Molly Ross, female    DOB: 02-Mar-1941, 74 y.o.   MRN: [id number]  PCP KNAPP,EVE A, MD   HPI  IOV 06/09/2015  Chief Complaint  Patient presents with  . Pulmonary Consult    Pt referred by Dr. Tomi Bamberger for abnormal CT.    74 year old female somewhat of a poor historian. History is gained from repeatedly asking the same questions in many different ways and also review of Dr Rita Ohara notes in the EMR   She reports that she is a 40 pack smoker having quit 34 years ago. In the 1970s and 1980s she had recurrent bouts of pneumonia which left her with bronchiectasis. At baseline she's had some chronic cough of unclear severity and shortness of breath with exertion of unclear severity but probably mild. Unclear if she has associated sputum. Unclear what aggravating and relieving factors where or are. Then in for a 2016 she developed over several weeks worsening cough with green sputum and some fever. Apparently was diagnosed with pneumonia. This then resulted in a CT scan of the chest and her 2016 which I personally reviewed and shows bilateral bronchiectasis particularly on the right middle lobe. She also had a nodule. Antibody treatment she improved. Then in April 2016 was started on Brio which inhaler therapy help her cough and shortness of breath significantly. Currently these are not much much of an issue for her. She has a good quality of life with the Brio. The Brio is expensive. She had follow-up CT scan of the chest 04/26/2015 that shows resolution of the nodule but persistence of the bronchiectasis. In fact she seems to have chronic bronchiectasis dating back on several chest x-rays although it back to 2009. She had test that she's had bronchiectasis since the 1980s. Currently no sputum production not much of a cough or shortness of breath. She denies having had bronchoscopy for the same.  She is not interested in much testing  Noted spirometry pre-breo March  2016  - fev 1.14L/45%, R 56 - severe obstructive lung disease And post Breo in June 05 2015  - Fev 1.32L/52%, R 57 -> significantly improved but still in moderate category   has a past medical history of Colon polyp; Unspecified vitamin D deficiency (02/2011); and Shingles (1/09 and 07/2010).   reports that she quit smoking about 34 years ago. Her smoking use included Cigarettes. She has a 48 pack-year smoking history. She has never used smokeless tobacco.  Past Surgical History  Procedure Laterality Date  . Shoulder surgery  03/2011    for frozen shoulder, and cyst removal (Dr. Percell Miller)    No Known Allergies  Immunization History  Administered Date(s) Administered  . Influenza, High Dose Seasonal PF 01/26/2014, 10/19/2014    Family History  Problem Relation Age of Onset  . Stroke Mother   . Diabetes Mother   . Stroke Sister 63    brainstem stroke  . Cancer Neg Hx     no breast/colon cancer     Current outpatient prescriptions:  .  Cholecalciferol (VITAMIN D) 2000 UNITS tablet, Take 2,000 Units by mouth daily., Disp: , Rfl:  .  fish oil-omega-3 fatty acids 1000 MG capsule, Take 2 g by mouth daily., Disp: , Rfl:  .  Fluticasone Furoate-Vilanterol (BREO ELLIPTA) 100-25 MCG/INH AEPB, Inhale 1 puff into the lungs daily., Disp: 60 each, Rfl: 0 .  Multiple Vitamins-Minerals (MULTIVITAMIN WITH MINERALS) tablet, Take 1 tablet by mouth daily., Disp: , Rfl:  Review of Systems  Constitutional: Negative for fever and unexpected weight change.  HENT: Negative for congestion, dental problem, ear pain, nosebleeds, postnasal drip, rhinorrhea, sinus pressure, sneezing, sore throat and trouble swallowing.   Eyes: Negative for redness and itching.  Respiratory: Positive for cough and chest tightness. Negative for shortness of breath and wheezing.   Cardiovascular: Negative for palpitations and leg swelling.  Gastrointestinal: Negative for nausea and vomiting.  Genitourinary: Negative  for dysuria.  Musculoskeletal: Negative for joint swelling.  Skin: Negative for rash.  Neurological: Negative for headaches.  Hematological: Does not bruise/bleed easily.  Psychiatric/Behavioral: Negative for dysphoric mood. The patient is not nervous/anxious.        Objective:   Physical Exam  Constitutional: She is oriented to person, place, and time. She appears well-developed and well-nourished. No distress.  HENT:  Head: Normocephalic and atraumatic.  Right Ear: External ear normal.  Left Ear: External ear normal.  Mouth/Throat: Oropharynx is clear and moist. No oropharyngeal exudate.  Eyes: Conjunctivae and EOM are normal. Pupils are equal, round, and reactive to light. Right eye exhibits no discharge. Left eye exhibits no discharge. No scleral icterus.  Neck: Normal range of motion. Neck supple. No JVD present. No tracheal deviation present. No thyromegaly present.  Cardiovascular: Normal rate, regular rhythm, normal heart sounds and intact distal pulses.  Exam reveals no gallop and no friction rub.   No murmur heard. Pulmonary/Chest: Effort normal. No respiratory distress. She has no wheezes. She has rales. She exhibits no tenderness.  Rt ll crackles  Abdominal: Soft. Bowel sounds are normal. She exhibits no distension and no mass. There is no tenderness. There is no rebound and no guarding.  Musculoskeletal: Normal range of motion. She exhibits no edema or tenderness.  Lymphadenopathy:    She has no cervical adenopathy.  Neurological: She is alert and oriented to person, place, and time. She has normal reflexes. No cranial nerve deficit. She exhibits normal muscle tone. Coordination normal.  Skin: Skin is warm and dry. No rash noted. She is not diaphoretic. No erythema. No pallor.  Psychiatric: She has a normal mood and affect. Her behavior is normal. Judgment and thought content normal.  Vitals reviewed.   Filed Vitals:   06/09/15 1619  BP: 130/62  Pulse: 79  Height: 5'  9" (1.753 m)  Weight: 142 lb (64.411 kg)  SpO2: 97%         Assessment & Plan:     ICD-9-CM ICD-10-CM   1. Bronchiectasis without complication 938.1 O17.5   2. History of smoking 25-50 pack years V15.82 Z87.891      #Chronic bronchiectasis  - minimal symptoms that are improved with breo - would not recommend any other RX else right now beyond inhaler therapy of breo - continue  - would not recommend MAI Rx given symptms - call your pharmacy and ask for cheaper alternative to Breo  - eg: symbicort, advair, dulera,  - full PFT in 3 months to assess severity of lung disease - flu shot inf all  - do annual CT chest till atleast age 82  #followup  full PFT in 3 months - will help adjust your inhaler therapy  Retrun to see me in 3 months   Dr. Brand Males, M.D., [facility].C.P Pulmonary and Critical Care Medicine Staff Physician [facility] Pager: [phone], If no answer or between  15:00h - 7:00h: call [phone]  06/10/2015 1:28 PM

## 2015-06-09 NOTE — Patient Instructions (Addendum)
ICD-9-CM ICD-10-CM   1. Bronchiectasis without complication 401.0 U72.5   2. History of smoking 25-50 pack years V15.82 Z87.891      #Chronic bronchiectasis  - minimal symptoms that are improved with breo - would not recommend anything else right now beyond inhaler therapy - call your pharmacy and ask for cheaper alternative to Breo  - eg: symbicort, advair, dulera,  - flu shot inf all - annual CT chest till age 74  #followup  full PFT in 3 months - will help adjust your inhaler therapy  Retrun to see me in 3 months

## 2015-07-13 ENCOUNTER — Encounter: Payer: Self-pay | Admitting: Medical

## 2015-07-13 ENCOUNTER — Ambulatory Visit (INDEPENDENT_AMBULATORY_CARE_PROVIDER_SITE_OTHER): Payer: Medicare Other | Admitting: Medical

## 2015-07-13 ENCOUNTER — Telehealth: Payer: Self-pay | Admitting: Medical

## 2015-07-13 VITALS — BP 150/74 | HR 76 | Temp 98.2°F | Resp 16 | Wt 148.0 lb

## 2015-07-13 DIAGNOSIS — IMO0001 Reserved for inherently not codable concepts without codable children: Secondary | ICD-10-CM

## 2015-07-13 DIAGNOSIS — R079 Chest pain, unspecified: Secondary | ICD-10-CM

## 2015-07-13 DIAGNOSIS — I25119 Atherosclerotic heart disease of native coronary artery with unspecified angina pectoris: Secondary | ICD-10-CM

## 2015-07-13 DIAGNOSIS — R030 Elevated blood-pressure reading, without diagnosis of hypertension: Secondary | ICD-10-CM | POA: Diagnosis not present

## 2015-07-13 NOTE — Progress Notes (Signed)
Subjective: Chief Complaint  Patient presents with  . chest pain and pressure this am    had sudden onset of chest pain and pressure this am at work, it lasted 10-15 minutes, left arm and fingers felt funny, EMS arrived and patient declined to be transported to ER instead following up here, feels okay now but somewhat different than normal denies and chest pain   Here today for chest pain and pressure.  Normally sees Dr. Tomi Bamberger here.  She has hx/o bronchiectasis, emphysema, former smoker, and recent abnormal CT chest including evidence of CAD was at work this morning when she has some unusual chest pressure.  EMS was called, EKG done, and was given option of ED evaluation, but she wanted to just come here and see Dr. Tomi Bamberger to discuss, EKG was ok.   While EMS was there, BP was 140/110 with 87 pulse, then a little later 150/74 with pulse of 84, a little later 130/60 standing.  Blood glucose was 92, pulse ox 97% room air.    She notes prior to this morning, been feeling fine.  This morning felt sudden pressure on chest and a pain in the central chest through back and up to jaw, left arm and fingers felt funny, lasted about 10 minutes.  During that time did feel some SOB, and nausea.  By the time EMS got there, pain was resolved.   Has had no other symptoms, and no recurrence.  Only had that 1 single episode.  At the time wasn't doing any thing out of the ordinary, wasn't doing anything but walking to the computer.   No recent worry and anxiety.  No recent heartburn or GERD.    Regarding her emphysema and bronchiectasis, using Breo daily, 1 puff once daily.  Not using albuterol inhaler currently.   Just saw pulmonology 06/09/15 to evaluate the abnormal findings on Chest CT.    Was advised to c/t Breo, and no other treatments recommended at that time other than 10mo f/u including PFTs.    EKG done by EMS shows sinus rhythm, rSr in V1, and there is no acute change compared to EKG done in 2014 here.   No prior  issues with elevated blood pressure.  No recent paresthsias, no recent vision changes.   She notes that breathing has been good on Breo other than the extreme heat.   Quit tobacco in 1981.    ROS as in subjective  Objective: BP 150/74 mmHg  Pulse 76  Temp(Src) 98.2 F (36.8 C) (Tympanic)  Resp 16  Wt 148 lb (67.132 kg)  SpO2 98%  BP Readings from Last 3 Encounters:  07/13/15 150/74  06/09/15 130/62  06/05/15 122/70    General appearance: alert, no distress, WD/WN, pleasant white female Oral cavity: MMM, no lesions Neck: supple, no lymphadenopathy, no thyromegaly, no masses, no bruits Heart: occasional skipped beat or premature beat, otherwise RRR, normal S1, S2, no murmurs Lungs: few scattered crackles of right lung fields in general, but seemingly good air flow throughout, no wheezing Abdomen: +bs, soft, non tender, non distended, no masses, no hepatomegaly, no splenomegaly, no obvious bruits Back: non tender Chest wall nontender Extremities: no edema, no cyanosis, no clubbing Pulses: 2+ symmetric, upper and lower extremities, normal cap refill Neurological: alert, oriented x 3, CN2-12 intact, nonfocal    Adult ECG Report  Indication: chest pain  Rate: 70 bpm  Rhythm: normal sinus rhythm  QRS Axis: 35 degrees  PR Interval: 165ms  QRS Duration: 30ms  QTc: 411ms  Conduction Disturbances: none  Other Abnormalities: none  Patient's cardiac risk factors are: advanced age (older than 28 for men, 69 for women) and former smoker.  EKG comparison: compared to 07/2013 EKG, no changes  Narrative Interpretation: normal EKG    Assessment: Encounter Diagnoses  Name Primary?  . Chest pain, unspecified chest pain type Yes  . 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 Elevated blood pressure        Plan: Discussed case with Dr. Tomi Bamberger as well, supervising physician.  She currently has had no more episodes of chest pain since the single episode this morning.   Given her symptoms though, age, hx/o tobacco use in the past, CAD on chest CT, referral to cardiology for stress testing.   C/t Asprin 81mg  QHS, avoid spicy and acidic foods in the chance some of this is GERD related, and discussed calling 911 if recurrence or worse of these same symptoms.

## 2015-07-13 NOTE — Telephone Encounter (Signed)
Made appointment with Dr Wynonia Lawman 08/07/15 @ 10:45, he will review records on Monday and let us know if he thinks patient needs to be seen sooner. Molly Ross is aware and okay with plan. LM for patient to call back to confirm that she understands.

## 2015-07-13 NOTE — Telephone Encounter (Signed)
Refer to Dr. Wynonia Lawman for chest pain, stress test, and preferably ASAP or at least within the next several days.

## 2015-07-18 ENCOUNTER — Telehealth: Payer: Self-pay | Admitting: Internal Medicine

## 2015-07-18 NOTE — Telephone Encounter (Signed)
Kappi with Dr. Wynonia Lawman office called stating that pt is coming in tomorrow for a nuclear stress test and because they have not seen pt yet they are needing [place] to do the Prior Auth for it.   Nuclear stress test CPT- [phone]

## 2015-07-19 NOTE — Telephone Encounter (Signed)
PRIOR AUTHORIZATION IS DONE AND COMPLETE AND WAS FAX OVER TO DR. TILLEY'S OFFICE

## 2015-07-20 DIAGNOSIS — I2510 Atherosclerotic heart disease of native coronary artery without angina pectoris: Secondary | ICD-10-CM | POA: Diagnosis not present

## 2015-07-20 DIAGNOSIS — R0789 Other chest pain: Secondary | ICD-10-CM | POA: Diagnosis not present

## 2015-08-04 ENCOUNTER — Encounter: Payer: Self-pay | Admitting: Family Medicine

## 2015-08-07 ENCOUNTER — Encounter: Payer: Self-pay | Admitting: Cardiology

## 2015-08-07 DIAGNOSIS — I2510 Atherosclerotic heart disease of native coronary artery without angina pectoris: Secondary | ICD-10-CM | POA: Insufficient documentation

## 2015-08-07 DIAGNOSIS — R0789 Other chest pain: Secondary | ICD-10-CM | POA: Diagnosis not present

## 2015-08-07 DIAGNOSIS — J449 Chronic obstructive pulmonary disease, unspecified: Secondary | ICD-10-CM | POA: Diagnosis not present

## 2015-08-07 NOTE — Progress Notes (Signed)
Patient ID: Molly Ross, female   DOB: 1941/12/03, 74 y.o.   MRN: [id number]   Ned Card  Date of visit:  08/07/2015 DOB:  [birth year]    Age:  74 yrs. Medical record number:  [id number]     Account number:  [id number] Primary Care [place]: KNAPP, EVE ____________________________ CURRENT DIAGNOSES  1. Chest pain  2. CAD Native without angina  3. COPD ____________________________ ALLERGIES  No Known Allergies ____________________________ MEDICATIONS  1. Vitamin D3 2,000 unit tablet, 1 p.o. daily  2. Fish Oil 300 mg-1,000 mg capsule, 2 p.o. daily  3. Breo Ellipta 100 mcg-25 mcg/dose powder for inhalation, qd  4. multivitamin tablet, 1 p.o. daily  5. Calcium 600 600 mg (1,500 mg) tablet, BID  6. nitroglycerin 0.4 mg sublingual tablet, PRN ____________________________ CHIEF COMPLAINTS  F/u after cardiolite  Followup of Chest pain ____________________________ HISTORY OF PRESENT ILLNESS This very nice 74 year old female is seen for evaluation of an isolated episode of chest discomfort. On July 28 she had an episode of substernal chest discomfort described as pressure with some arm tingling and EMS was called and the pain eventually resolved. She was seen later by Dorothea Ogle and an EKG was unremarkable. She previously had been seen by pulmonary because of emphysema and bronchiectasis and also was noted to have coronary calcification in 2 vessels on a CT scan. She has not had any symptoms since then. After phone consultation elected to schedule her for a myocardial perfusion scan in lieu of a consultation since we did not have one available and she exercised 5 minutes 30 seconds into Bruce stage II with no EKG changes. She had no evidence of ischemia with an ejection fraction of 76%. She has not had any recurrent chest pain since then. She is currently a nonsmoker and also has a history of very minimal hyperlipidemia. There is no history of hypertension. She denies PND, orthopnea or  claudication. ____________________________ PAST HISTORY  Past Medical Illnesses:  COPD, rheumatic fever;  Cardiovascular Illnesses:  no previous history of cardiac disease.;  Surgical Procedures:  shoulder surg;  NYHA Classification:  I;  Canadian Angina Classification:  Class 0: Asymptomatic;  Cardiology Procedures-Invasive:  no history of prior cardiac procedures;  Cardiology Procedures-Noninvasive:  treadmill cardiolite;  LVEF of 76% documented via nuclear study on 07/20/2015,   ____________________________ CARDIO-PULMONARY TEST DATES EKG Date:  07/20/2015;  Nuclear Study Date:  07/20/2015;  CT Scan Date:  06/05/2015   ____________________________ FAMILY HISTORY Father -- Father dead, Malignant neoplasm of lung, COPD Mother -- Mother dead, CVA, Diabetes mellitus, Heart Attack Sister -- Sister alive with problem, Ovarian carcinoma Sister -- Sister alive with problem, CVA Sister -- Sister alive and well ____________________________ SOCIAL HISTORY Alcohol Use:  occasionally and wine;  Smoking:  used to smoke but quit 1982, 25 pack year history;  Diet:  regular diet;  Lifestyle:  widowed;  Exercise:  no regular exercise;  Occupation:  Financial trader;   ____________________________ REVIEW OF SYSTEMS General:  denies recent weight change, fatique or change in exercise tolerance.  Integumentary:no rashes or new skin lesions. Eyes: denies diplopia, history of glaucoma or visual problems. Ears, Nose, Throat, Mouth:  denies any hearing loss, epistaxis, hoarseness or difficulty speaking. Respiratory: dyspnea with exertion Cardiovascular:  please review HPI Abdominal: constipationGenitourinary-Female: no dysuria, urgency, frequency, UTIs, or stress incontinence Musculoskeletal:  denies arthritis, venous insufficiency, or muscle weakness Neurological:  denies headaches, stroke, or TIA Psychiatric:  denies depression or anxiety Hematological/Immunologic:  denies any food  allergies, bleeding  disorders. ____________________________ PHYSICAL EXAMINATION VITAL SIGNS  Blood Pressure:  136/70 Sitting, Right arm, regular cuff  , 130/74 Standing, Right arm and regular cuff   Pulse:  78/min. Weight:  148.00 lbs. Height:  69"BMI: 22  Constitutional:  pleasant white female, in no acute distress Skin:  warm and dry to touch, no apparent skin lesions, or masses noted. Head:  normocephalic, normal hair pattern, no masses or tenderness Eyes:  EOMS Intact, PERRLA, C and S clear, Funduscopic exam not done. ENT:  ears, nose and throat reveal no gross abnormalities.  Dentition good. Neck:  supple, without massess. No JVD, thyromegaly or carotid bruits. Carotid upstroke normal. Chest:  normal symmetry, clear to auscultation. Cardiac:  regular rhythm, normal S1 and S2, No S3 or S4, no murmurs, gallops or rubs detected. Abdomen:  abdomen soft,non-tender, no masses, no hepatospenomegaly, or aneurysm noted Peripheral Pulses:  the femoral,dorsalis pedis, and posterior tibial pulses are full and equal bilaterally with no bruits auscultated. Extremities & Back:  bilateral bilateral venous insufficiency changes present, trace edema Neurological:  no gross motor or sensory deficits noted, affect appropriate, oriented x3. ____________________________ MOST RECENT LIPID PANEL 06/05/15  CHOL TOTL 180 mg/dl, LDL 95 NM, HDL 76 mg/dl, TRIGLYCER 45 mg/dl and CHOL/HDL 2.4 (Calc) ____________________________ IMPRESSIONS/PLAN  1. Isolated episode of substernal chest discomfort with no evidence of myocardial ischemia noted on myocardial perfusion scan 2. Coronary artery disease as manifested by coronary calcification on chest CT scan 3. Mild hyperlipidemia 4. Bronchiectasis and emphysema  Recommendations:  At the present time she does not have any ischemia and did have an isolated episode of chest discomfort that could have been angina. My recommendations at this time would be for her to use low-dose aspirin  81 mg and that she consider taking a statin drug. She will like to discuss this with her primary physician and in light of her known coronary atherosclerosis by coronary calcification I would suggest a high intensity statin such as atorvastatin 40 mg daily. I also gave her a prescription for nitroglycerin to use if she has any recurrent episodes. Otherwise I have asked her to followup with her primary physician and would be happy to see her again should the need arise.   ____________________________ Cardiology Physician:  Kerry Hough MD [facility]

## 2015-08-24 ENCOUNTER — Telehealth: Payer: Self-pay | Admitting: Medical

## 2015-08-24 NOTE — Telephone Encounter (Signed)
Have her come in for f/u from last visit

## 2015-08-24 NOTE — Telephone Encounter (Signed)
Pt will be following up with PCP, Dr. Tomi Bamberger. Appt has been made.

## 2015-08-30 ENCOUNTER — Ambulatory Visit (INDEPENDENT_AMBULATORY_CARE_PROVIDER_SITE_OTHER): Payer: Medicare Other | Admitting: Family Medicine

## 2015-08-30 ENCOUNTER — Encounter: Payer: Self-pay | Admitting: Family Medicine

## 2015-08-30 VITALS — BP 138/64 | HR 76 | Ht 69.0 in | Wt 146.6 lb

## 2015-08-30 DIAGNOSIS — I2510 Atherosclerotic heart disease of native coronary artery without angina pectoris: Secondary | ICD-10-CM | POA: Diagnosis not present

## 2015-08-30 DIAGNOSIS — I700 Atherosclerosis of aorta: Secondary | ICD-10-CM

## 2015-08-30 DIAGNOSIS — Z23 Encounter for immunization: Secondary | ICD-10-CM

## 2015-08-30 DIAGNOSIS — J479 Bronchiectasis, uncomplicated: Secondary | ICD-10-CM | POA: Diagnosis not present

## 2015-08-30 MED ORDER — ATORVASTATIN CALCIUM 40 MG PO TABS: 40.0000 mg | ORAL_TABLET | Freq: Every day | ORAL | Status: DC

## 2015-08-30 NOTE — Patient Instructions (Signed)
  If you develop any side effects such as muscle aches from the medication, try adding Coenzyme Q10 along with it, every day.  This usually helps reduce the side effects. If you still can't tolerate the side effects, then you may decrease the dose of atorvastatin to 20mg  by cutting the tablets in half. If you still cannot tolerate the 1/2 tablet (along with the coenzyme Q10), please let us know. Take the 81mg  of aspirin with food. Take the atorvastatin in the evening/bedtime. You can change this to the morning if you have a hard time remembering to take it, and if you don't have any side effects when taking it during the day.

## 2015-08-30 NOTE — Progress Notes (Signed)
Chief Complaint  Patient presents with  . Follow-up    saw Dr.Tilley and he recommended follow up to start cholesterol medication.    Patient presents to discuss Dr. Thurman Coyer recommendations and statin medications. She was noted to have coronary calcification in 2 vessels on CT scan.  She was seen by Audelia Acton after an episode of chest pain; she was referred to Dr. Wynonia Lawman and had a myocardial perfusion scan. She exercised 5 minutes 30 seconds into Bruce stage II with no EKG changes. She had no evidence of ischemia with an ejection fraction of 76%. She has not had any recurrent chest pain since then. Dr. Wynonia Lawman recommend she start 81mg  aspirin daily.  She has been taking this without any side effects--no GI complaints or bleeding/bruising. She was also given nitroglycerin, which she hasn't needed to take.  It was recommended that she consider taking a statin drug. She presents to discuss this today  Her breathing has improved since being on the [place]. Has f/u PFT's scheduled in October.  Coughing less.  PMH, PSH. SH reviewed.  Outpatient Encounter Prescriptions as of 08/30/2015  Medication Sig Note  . aspirin EC 81 MG tablet Take 81 mg by mouth daily.   . Calcium-Magnesium-Vitamin D (CALCIUM 500 PO) Take 1 tablet by mouth daily.   . Cholecalciferol (VITAMIN D) 2000 UNITS tablet Take 2,000 Units by mouth daily.   . fish oil-omega-3 fatty acids 1000 MG capsule Take 3 g by mouth daily.  08/30/2015: Takes OMEGA XL-contains 920mg  per cap and takes 4 per day~1380 daily  . Fluticasone Furoate-Vilanterol (BREO ELLIPTA) 100-25 MCG/INH AEPB Inhale 1 puff into the lungs daily. 08/30/2015: Using 1 puff daily  . Multiple Vitamins-Minerals (MULTIVITAMIN WITH MINERALS) tablet Take 1 tablet by mouth daily.   Marland Kitchen atorvastatin (LIPITOR) 40 MG tablet Take 1 tablet (40 mg total) by mouth daily.   Marland Kitchen NITROSTAT 0.4 MG SL tablet USE AS DIRECTED AS NEEDED 08/30/2015: Received from: External Pharmacy   No facility-administered  encounter medications on file as of 08/30/2015.  (atorvastatin started today, NOT prior to visit).  No Known Allergies  ROS: no fever, chills, URI symptoms.  Cough is improved, no shortness of breath. No myalgias, arthralgias, leg cramps. No GI complaints. No chest pain, palpitations, headaches, dizziness.  PHYSICAL EXAM: BP 138/64 mmHg  Pulse 76  Ht 5\' 9"  (1.753 m)  Wt 146 lb 9.6 oz (66.497 kg)  BMI 21.64 kg/m2  Well developed, pleasant female in no distress HEENT: PERRL, EOMI, conjunctiva clear Neck: no lymphadenopathy, thyromegaly or mass; no carotid bruit Heart: regular rate and rhythm  Lungs: clear bilaterally Abdomen: soft, nontender, no mass Extremities: no edema, normal pulses Psych: normal mood, affect, hygiene and grooming Neuro: alert and oriented, cranial nerves intact; normal strength, gait  Lab Results  Component Value Date   CHOL 180 06/05/2015   HDL 76 06/05/2015   LDLCALC 95 06/05/2015   TRIG 45 06/05/2015   CHOLHDL 2.4 06/05/2015    ASSESSMENT/PLAN:  Coronary artery disease involving native coronary artery of native heart without angina pectoris - asymptomatic. Start statin. risks/side effects reviewed.  continue daily aspirin. - Plan: atorvastatin (LIPITOR) 40 MG tablet  Need for prophylactic vaccination and inoculation against influenza - Plan: Flu vaccine HIGH DOSE PF (Fluzone High dose)  Atherosclerosis of aorta  Bronchiectasis without complication - improved.  continue inhaled steroid and f/u with pulmonary as scheduled   Counseled extensively re: risks/benefits and possible side effects of statins. Discussed current guidelines--strength of statins, dose, etc.  Will start with atorvastatin 40mg  as per Dr. Thurman Coyer recommendation.    If you develop any side effects such as muscle aches from the medication, try adding Coenzyme Q10 along with it, every day.  This usually helps reduce the side effects. If you still can't tolerate the side effects,  then you may decrease the dose of atorvastatin to 20mg  by cutting the tablets in half. If you still cannot tolerate the 1/2 tablet (along with the coenzyme Q10), please let us know. Take the 81mg  of aspirin with food. Take the atorvastatin in the evening/bedtime. You can change this to the morning if you have a hard time remembering to take it, and if you don't have any side effects when taking it during the day.  25 minute visit, more than 1/2 spent counseling.  Return in 8-10 weeks for fasting labs

## 2015-09-18 ENCOUNTER — Ambulatory Visit (INDEPENDENT_AMBULATORY_CARE_PROVIDER_SITE_OTHER): Payer: Medicare Other | Admitting: Medical

## 2015-09-18 ENCOUNTER — Encounter: Payer: Self-pay | Admitting: Medical

## 2015-09-18 VITALS — BP 120/58 | HR 60 | Temp 98.2°F | Wt 144.0 lb

## 2015-09-18 DIAGNOSIS — J471 Bronchiectasis with (acute) exacerbation: Secondary | ICD-10-CM

## 2015-09-18 MED ORDER — ALBUTEROL SULFATE HFA 108 (90 BASE) MCG/ACT IN AERS: 2.0000 | INHALATION_SPRAY | Freq: Four times a day (QID) | RESPIRATORY_TRACT | Status: DC | PRN

## 2015-09-18 MED ORDER — LEVOFLOXACIN 500 MG PO TABS: 500.0000 mg | ORAL_TABLET | Freq: Every day | ORAL | Status: DC

## 2015-09-18 NOTE — Progress Notes (Signed)
Subjective: Chief Complaint  Patient presents with  . sore throat    and bad cough. hasnt taken anything for it. started sunday. no other problems   Here for 2-3 days of abrupt onset of sore throat, productive cough, chest congestion, wheezing, and not feeling well.   No other symptoms .  She does have 1 recent sick contact with respiratory symptoms that she picked up from the airport over the weekend.  She recently was diagnosed with bronchiectasis, has hx/o 25+ years smoking history but quit 30 + years ago.   No fever, no NVD, no chest pain, no ear pain.   She does have some head pressure.  No other aggravating or relieving factors. No other complaint.  ROS as in subjective   Objective: BP 120/58 mmHg  Pulse 60  Temp(Src) 98.2 F (36.8 C)  Wt 144 lb (65.318 kg)  Gen: wd, wn, nad HENT unremarkable, TMs pearly, pharynx with minimal erythema, nares patent Lungs with +rhonchi, decreased breath sounds, few faint wheezes, but no dullness Heart RRR, normal S1, S2, no murmurs Ext: no edema Pulses normal     Assessment: Encounter Diagnosis  Name Primary?  . Bronchiectasis with acute exacerbation ([place]) Yes    Plan: Reviewed 04/2015 chest CT in chart, last labs and last PFTs from June, recent cardiology eval notes.  She is already on Breo for months.   Will treat for likely acute bronchiectasis flare.   Begin Levaquin, albuterol inhaler, c/t Breo, rest, hydrate well, and call or return if not seeing good improvement in the next 3-4 days.  Discussed possible complications, need for other therapy if worsening.     Shellie was seen today for sore throat.  Diagnoses and all orders for this visit:  Bronchiectasis with acute exacerbation ([place])  Other orders -     albuterol (PROVENTIL HFA;VENTOLIN HFA) 108 (90 BASE) MCG/ACT inhaler; Inhale 2 puffs into the lungs every 6 (six) hours as needed for wheezing or shortness of breath. -     levofloxacin (LEVAQUIN) 500 MG tablet; Take 1 tablet  (500 mg total) by mouth daily.

## 2015-09-26 ENCOUNTER — Ambulatory Visit: Payer: Medicare Other | Admitting: Internal Medicine

## 2015-10-02 ENCOUNTER — Telehealth: Payer: Self-pay | Admitting: Medical

## 2015-10-02 ENCOUNTER — Ambulatory Visit (INDEPENDENT_AMBULATORY_CARE_PROVIDER_SITE_OTHER): Payer: Medicare Other | Admitting: Medical

## 2015-10-02 ENCOUNTER — Encounter: Payer: Self-pay | Admitting: Medical

## 2015-10-02 VITALS — BP 114/52 | HR 92 | Temp 98.7°F | Resp 12 | Wt 145.0 lb

## 2015-10-02 DIAGNOSIS — J470 Bronchiectasis with acute lower respiratory infection: Secondary | ICD-10-CM

## 2015-10-02 LAB — CBC WITH DIFFERENTIAL/PLATELET
BASOS PCT: 0 % (ref 0–1)
Basophils Absolute: 0 10*3/uL (ref 0.0–0.1)
EOS ABS: 0.2 10*3/uL (ref 0.0–0.7)
EOS PCT: 1 % (ref 0–5)
HCT: 36 % (ref 36.0–46.0)
HEMOGLOBIN: 11.8 g/dL — AB (ref 12.0–15.0)
LYMPHS PCT: 14 % (ref 12–46)
Lymphs Abs: 2.6 10*3/uL (ref 0.7–4.0)
MCH: 28.9 pg (ref 26.0–34.0)
MCHC: 32.8 g/dL (ref 30.0–36.0)
MCV: 88 fL (ref 78.0–100.0)
MONO ABS: 1.5 10*3/uL — AB (ref 0.1–1.0)
MPV: 9 fL (ref 8.6–12.4)
Monocytes Relative: 8 % (ref 3–12)
Neutro Abs: 14.1 10*3/uL — ABNORMAL HIGH (ref 1.7–7.7)
Neutrophils Relative %: 77 % (ref 43–77)
Platelets: 314 10*3/uL (ref 150–400)
RBC: 4.09 MIL/uL (ref 3.87–5.11)
RDW: 13.2 % (ref 11.5–15.5)
WBC: 18.3 10*3/uL — AB (ref 4.0–10.5)

## 2015-10-02 MED ORDER — PREDNISONE 10 MG PO TABS: ORAL_TABLET | ORAL | Status: DC

## 2015-10-02 NOTE — Telephone Encounter (Signed)
She needs to either come back in here or with her lung doctor for recheck/re-exam

## 2015-10-02 NOTE — Telephone Encounter (Signed)
Pt is coming in this afternoon since this was her day off

## 2015-10-02 NOTE — Telephone Encounter (Signed)
Pt called and said that she was still not feeling good and was wondering if you would send her something else in, still had bad cough and still coughing up green stuff, pt uses [facility] on [address], pt can be reached at [phone] (H)

## 2015-10-02 NOTE — Progress Notes (Signed)
Subjective: Chief Complaint  Patient presents with  . bad cough    been 2 weeks, feels somewhat better but not good. coughing up yellow flim.   Here for recheck.   I saw her 09/18/15 for same.   She notes having some mild improvements since then on Levaquin for extended period, and using albuterol inhaler some.    But she continues to have productive cough, fatigue, feels drainage.   Denies nausea, vomiting, chest pain, leg edema, sore throat, ear pain, sick contacts.    She recently was diagnosed with bronchiectasis in past few months, has hx/o 25+ years smoking history but quit 30 + years ago.  She has never been on home O2 or home nebulizer treatments.  She does have a pulmonologist.  No other aggravating or relieving factors. No other complaint.  ROS as in subjective   Objective: BP 114/52 mmHg  Pulse 92  Temp(Src) 98.7 F (37.1 C)  Resp 12  Wt 145 lb (65.772 kg)  SpO2 98%  Gen: wd, wn, nad HENT unremarkable, TMs pearly, pharynx normal appearing, nares patent Lungs with +rhonchi, decreased breath sounds, few faint wheezes, but no dullness Heart RRR, normal S1, S2, no murmurs Ext: no edema Pulses normal    Assessment: Encounter Diagnosis  Name Primary?  . Bronchiectasis with acute lower respiratory infection ([place]) Yes    Plan: Reviewed 04/2015 chest CT in chart, last labs and last PFTs from June, recent cardiology eval notes.  She is already on Breo for months.   She has finisehd Levaquin.  C/t albuterol inhaler, c/t Breo, rest, hydrate well, and we will get CBC and chest xray.  Add prednisone to help reduce inflammation.   Going further consider mucolytic, home nebulized therapy.     Louie was seen today for bad cough.  Diagnoses and all orders for this visit:  Bronchiectasis with acute lower respiratory infection ([place]) -     DG Chest 2 View; Future -     Cancel: CBC with Differential/Platelet -     CBC with Differential/Platelet  Other orders -     predniSONE  (DELTASONE) 10 MG tablet; 6/5/4/3/2/1 taper

## 2015-10-03 ENCOUNTER — Ambulatory Visit
Admission: RE | Admit: 2015-10-03 | Discharge: 2015-10-03 | Disposition: A | Discharge status: Home / Self Care | Payer: Medicare Other | Source: Ambulatory Visit | Attending: Medical | Admitting: Medical

## 2015-10-03 ENCOUNTER — Other Ambulatory Visit: Payer: Self-pay | Admitting: Medical

## 2015-10-03 DIAGNOSIS — R05 Cough: Secondary | ICD-10-CM | POA: Diagnosis not present

## 2015-10-03 DIAGNOSIS — J470 Bronchiectasis with acute lower respiratory infection: Secondary | ICD-10-CM

## 2015-10-03 MED ORDER — AZITHROMYCIN 250 MG PO TABS: ORAL_TABLET | ORAL | Status: DC

## 2015-10-27 ENCOUNTER — Other Ambulatory Visit: Payer: Self-pay | Admitting: Internal Medicine

## 2015-10-27 DIAGNOSIS — R0600 Dyspnea, unspecified: Secondary | ICD-10-CM

## 2015-10-30 ENCOUNTER — Other Ambulatory Visit: Payer: Medicare Other

## 2015-10-30 ENCOUNTER — Ambulatory Visit (INDEPENDENT_AMBULATORY_CARE_PROVIDER_SITE_OTHER): Payer: Medicare Other | Admitting: Internal Medicine

## 2015-10-30 ENCOUNTER — Telehealth: Payer: Self-pay | Admitting: *Deleted

## 2015-10-30 DIAGNOSIS — Z79899 Other long term (current) drug therapy: Secondary | ICD-10-CM | POA: Diagnosis not present

## 2015-10-30 DIAGNOSIS — E785 Hyperlipidemia, unspecified: Secondary | ICD-10-CM | POA: Diagnosis not present

## 2015-10-30 DIAGNOSIS — R0600 Dyspnea, unspecified: Secondary | ICD-10-CM

## 2015-10-30 LAB — PULMONARY FUNCTION TEST
DL/VA % pred: 83 %
DL/VA: 4.49 ml/min/mmHg/L
DLCO unc % pred: 51 %
DLCO unc: 16.04 ml/min/mmHg
FEF 25-75 Post: 0.64 L/sec
FEF 25-75 Pre: 0.54 L/sec
FEF2575-%CHANGE-POST: 18 %
FEF2575-%PRED-PRE: 26 %
FEF2575-%Pred-Post: 31 %
FEV1-%Change-Post: 7 %
FEV1-%PRED-POST: 51 %
FEV1-%PRED-PRE: 48 %
FEV1-POST: 1.36 L
FEV1-PRE: 1.27 L
FEV1FVC-%Change-Post: 1 %
FEV1FVC-%Pred-Pre: 79 %
FEV6-%Change-Post: 6 %
FEV6-%Pred-Post: 66 %
FEV6-%Pred-Pre: 63 %
FEV6-POST: 2.23 L
FEV6-PRE: 2.1 L
FEV6FVC-%Change-Post: 0 %
FEV6FVC-%PRED-POST: 103 %
FEV6FVC-%PRED-PRE: 103 %
FVC-%CHANGE-POST: 5 %
FVC-%PRED-PRE: 61 %
FVC-%Pred-Post: 64 %
FVC-POST: 2.26 L
FVC-PRE: 2.13 L
POST FEV6/FVC RATIO: 99 %
PRE FEV1/FVC RATIO: 60 %
Post FEV1/FVC ratio: 60 %
Pre FEV6/FVC Ratio: 98 %
RV % PRED: 118 %
RV: 2.98 L
TLC % PRED: 90 %
TLC: 5.24 L

## 2015-10-30 LAB — LIPID PANEL
CHOLESTEROL: 166 mg/dL (ref 125–200)
HDL: 85 mg/dL (ref >=46)
LDL Cholesterol: 73 mg/dL (ref <130)
TRIGLYCERIDES: 42 mg/dL (ref <150)
Total CHOL/HDL Ratio: 2 Ratio (ref <=5.0)
VLDL: 8 mg/dL (ref <30)

## 2015-10-30 LAB — HEPATIC FUNCTION PANEL
ALT: 12 U/L (ref 6–29)
AST: 19 U/L (ref 10–35)
Albumin: 3.9 g/dL (ref 3.6–5.1)
Alkaline Phosphatase: 66 U/L (ref 33–130)
BILIRUBIN INDIRECT: 0.8 mg/dL (ref 0.2–1.2)
Bilirubin, Direct: 0.2 mg/dL (ref <=0.2)
Total Bilirubin: 1 mg/dL (ref 0.2–1.2)
Total Protein: 6.5 g/dL (ref 6.1–8.1)

## 2015-10-30 NOTE — Telephone Encounter (Signed)
Patient was in for fasting labs this am-no orders in system. I had Jovanka draw blood and put on side (was thinking maybe lipid/liver) but wasn't sure. Please let me know.

## 2015-10-30 NOTE — Progress Notes (Signed)
PFT performed today. 

## 2015-10-30 NOTE — Telephone Encounter (Signed)
Yes.  Please enter liver and lipids

## 2015-11-06 ENCOUNTER — Ambulatory Visit (INDEPENDENT_AMBULATORY_CARE_PROVIDER_SITE_OTHER): Payer: Medicare Other | Admitting: Internal Medicine

## 2015-11-06 ENCOUNTER — Encounter: Payer: Self-pay | Admitting: Internal Medicine

## 2015-11-06 VITALS — BP 138/78 | HR 73 | Ht 69.0 in | Wt 150.0 lb

## 2015-11-06 DIAGNOSIS — J449 Chronic obstructive pulmonary disease, unspecified: Secondary | ICD-10-CM | POA: Diagnosis not present

## 2015-11-06 DIAGNOSIS — J479 Bronchiectasis, uncomplicated: Secondary | ICD-10-CM

## 2015-11-06 HISTORY — DX: Chronic obstructive pulmonary disease, unspecified: J44.9

## 2015-11-06 MED ORDER — TIOTROPIUM BROMIDE MONOHYDRATE 2.5 MCG/ACT IN AERS: 2.0000 | INHALATION_SPRAY | Freq: Two times a day (BID) | RESPIRATORY_TRACT | Status: DC

## 2015-11-06 NOTE — Progress Notes (Signed)
Subjective:     Patient ID: Molly Ross, female   DOB: [birth year], 74 y.o.   MRN: [id number]  HPI  IOV 06/09/2015  Chief Complaint  Patient presents with  . Pulmonary Consult    Pt referred by Dr. Tomi Bamberger for abnormal CT.    74 year old female somewhat of a poor historian. History is gained from repeatedly asking the same questions in many different ways and also review of Dr Rita Ohara notes in the EMR   She reports that she is a 40 pack smoker having quit 34 years ago. In the 1970s and 1980s she had recurrent bouts of pneumonia which left her with bronchiectasis. At baseline she's had some chronic cough of unclear severity and shortness of breath with exertion of unclear severity but probably mild. Unclear if she has associated sputum. Unclear what aggravating and relieving factors where or are. Then in for a 2016 she developed over several weeks worsening cough with green sputum and some fever. Apparently was diagnosed with pneumonia. This then resulted in a CT scan of the chest and her 2016 which I personally reviewed and shows bilateral bronchiectasis particularly on the right middle lobe. She also had a nodule. Antibody treatment she improved. Then in April 2016 was started on Brio which inhaler therapy help her cough and shortness of breath significantly. Currently these are not much much of an issue for her. She has a good quality of life with the Brio. The Brio is expensive. She had follow-up CT scan of the chest 04/26/2015 that shows resolution of the nodule but persistence of the bronchiectasis. In fact she seems to have chronic bronchiectasis dating back on several chest x-rays although it back to 2009. She had test that she's had bronchiectasis since the 1980s. Currently no sputum production not much of a cough or shortness of breath. She denies having had bronchoscopy for the same.  She is not interested in much testing  Noted spirometry pre-breo March 2016  - fev 1.14L/45%, R 56 -  severe obstructive lung disease And post Breo in June 05 2015  - Fev 1.32L/52%, R 57 -> significantly improved but still in moderate category   OV 11/06/2015  Chief Complaint  Patient presents with  . Follow-up    Pt here after PFT. Pt states she respiratory infection in October and was on 2 rounds of abx and is now back to her baseline. Pt c/o mild non prod cough. Pt denies CP/tightness.    Previous heavy smoker with right middle lobe chronic bronchiectasis with findings suggestive of MAI and moderate/severe obstructive lung disease with findings of emphysema on CT chest - February and May 2016  - Last seen in June 2016. At that time he started Brio. Overall she has been doing well. However in October 2016 she had a respiratory exacerbation. She was treated with 2 rounds of anabiotic's and prednisone course and then she fell baseline. She is followed up with a pulmonary function test  t 10/30/2015) baseline and well. 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 Currently she is feeling well. Review of vaccine shows she is up-to-date with flu shot. She thinks she has had the pneumonia vaccine with her primary care physician but she's not sure and she is going to check with her.  No new issues.   Current outpatient prescriptions:  .  albuterol (PROVENTIL HFA;VENTOLIN HFA) 108 (90 BASE) MCG/ACT inhaler,  Inhale 2 puffs into the lungs every 6 (six) hours as needed for wheezing or shortness of breath., Disp: 1 Inhaler, Rfl: 0 .  aspirin EC 81 MG tablet, Take 81 mg by mouth daily., Disp: , Rfl:  .  atorvastatin (LIPITOR) 40 MG tablet, Take 1 tablet (40 mg total) by mouth daily., Disp: 30 tablet, Rfl: 2 .  Calcium-Magnesium-Vitamin D (CALCIUM 500 PO), Take 1 tablet by mouth daily., Disp: , Rfl:  .  Cholecalciferol (VITAMIN D) 2000 UNITS tablet, Take 2,000 Units by mouth daily., Disp: , Rfl:  .  fish  oil-omega-3 fatty acids 1000 MG capsule, Take 3 g by mouth daily. , Disp: , Rfl:  .  Fluticasone Furoate-Vilanterol (BREO ELLIPTA) 100-25 MCG/INH AEPB, Inhale 1 puff into the lungs daily., Disp: 60 each, Rfl: 0 .  Multiple Vitamins-Minerals (MULTIVITAMIN WITH MINERALS) tablet, Take 1 tablet by mouth daily., Disp: , Rfl:  .  NITROSTAT 0.4 MG SL tablet, USE AS DIRECTED AS NEEDED, Disp: , Rfl: 12  Immunization History  Administered Date(s) Administered  . Influenza, High Dose Seasonal PF 01/26/2014, 10/19/2014, 08/30/2015       Review of Systems  According to HPI     Objective:   Physical Exam  Constitutional: She is oriented to person, place, and time. She appears well-developed and well-nourished. No distress.  HENT:  Head: Normocephalic and atraumatic.  Right Ear: External ear normal.  Left Ear: External ear normal.  Mouth/Throat: Oropharynx is clear and moist. No oropharyngeal exudate.  Eyes: Conjunctivae and EOM are normal. Pupils are equal, round, and reactive to light. Right eye exhibits no discharge. Left eye exhibits no discharge. No scleral icterus.  Neck: Normal range of motion. Neck supple. No JVD present. No tracheal deviation present. No thyromegaly present.  Cardiovascular: Normal rate, regular rhythm, normal heart sounds and intact distal pulses.  Exam reveals no gallop and no friction rub.   No murmur heard. Pulmonary/Chest: Effort normal. No respiratory distress. She has no wheezes. She has rales. She exhibits no tenderness.  Right middle lobe crackles +  Abdominal: Soft. Bowel sounds are normal. She exhibits no distension and no mass. There is no tenderness. There is no rebound and no guarding.  Musculoskeletal: Normal range of motion. She exhibits no edema or tenderness.  Lymphadenopathy:    She has no cervical adenopathy.  Neurological: She is alert and oriented to person, place, and time. She has normal reflexes. No cranial nerve deficit. She exhibits normal  muscle tone. Coordination normal.  Skin: Skin is warm and dry. No rash noted. She is not diaphoretic. No erythema. No pallor.  Psychiatric: She has a normal mood and affect. Her behavior is normal. Judgment and thought content normal.  Vitals reviewed.   Filed Vitals:   11/06/15 1438  BP: 138/78  Pulse: 73  Height: 5\' 9"  (1.753 m)  Weight: 150 lb (68.04 kg)  SpO2: 96%   Body mass index is 22.14 kg/(m^2).       Assessment:       ICD-9-CM ICD-10-CM   1. COPD, moderate ([place]) 496 J44.9   2. Bronchiectasis without complication ([place]) A999333 J47.9        Plan:     glad you are better after fall 2016 infection respiratory  PLAN - Continue Brio 1 puff daily - Start Spiriva Respimat 2 puff daily - Use albuterol as needed - We will monitor the frequency of recurrent flareups  - If more than several per year we might have to do a  bronchoscopy; you have had 2 resp inections in the year 2016  - We can also consider Roflumilast at follow-up if frequent exacerbations present - Please chjeck with pcp KNAPP,EVE A, MD when last pneumonia vaccine was  Follow-up - 3 months or sooner if needed  - At follow-up we will discuss pulmonary rehabilitation and alpha-1 testing    Dr. Brand Males, M.D., [facility].C.P Pulmonary and Critical Care Medicine Staff Physician [facility] Pager: [phone], If no answer or between  15:00h - 7:00h: call [phone]  11/06/2015 3:15 PM

## 2015-11-06 NOTE — Addendum Note (Signed)
Addended by: Maurice March on: 11/06/2015 03:24 PM   Modules accepted: Orders

## 2015-11-06 NOTE — Patient Instructions (Addendum)
ICD-9-CM ICD-10-CM   1. COPD, moderate ([place]) 496 J44.9   2. Bronchiectasis without complication ([place]) A999333 J47.9    - glad you are better after fall 2016 infection respiratory  PLAN - Continue Brio 1 puff daily - Start Spiriva Respimat 2 puff daily - Use albuterol as needed - We will monitor the frequency of recurrent flareups  - If more than several per year we might have to do a bronchoscopy; you have had 2 in the year 2016  - Alternatively consider Roflumilast - Please have pneumonia vaccine status check with her primary care physician  Follow-up - 3 months or sooner if needed  - At follow-up we will discuss pulmonary rehabilitation and alpha-1 testing

## 2015-12-02 ENCOUNTER — Other Ambulatory Visit: Payer: Self-pay | Admitting: Family Medicine

## 2015-12-04 ENCOUNTER — Telehealth: Payer: Self-pay | Admitting: Internal Medicine

## 2015-12-04 MED ORDER — PREDNISONE 10 MG PO TABS: ORAL_TABLET | ORAL | Status: DC

## 2015-12-04 MED ORDER — AZITHROMYCIN 250 MG PO TABS: ORAL_TABLET | ORAL | Status: DC

## 2015-12-04 NOTE — Telephone Encounter (Signed)
Do Zpak Do Please take prednisone 40 mg x1 day, then 30 mg x1 day, then 20 mg x1 day, then 10 mg x1 day, and then 5 mg x1 day and stop    Her allergies - No Known Allergies  Her baseline mds -  Current outpatient prescriptions:  .  albuterol (PROVENTIL HFA;VENTOLIN HFA) 108 (90 BASE) MCG/ACT inhaler, Inhale 2 puffs into the lungs every 6 (six) hours as needed for wheezing or shortness of breath., Disp: 1 Inhaler, Rfl: 0 .  aspirin EC 81 MG tablet, Take 81 mg by mouth daily., Disp: , Rfl:  .  atorvastatin (LIPITOR) 40 MG tablet, TAKE 1 TABLET BY MOUTH EVERY DAY, Disp: 30 tablet, Rfl: 0 .  Calcium-Magnesium-Vitamin D (CALCIUM 500 PO), Take 1 tablet by mouth daily., Disp: , Rfl:  .  Cholecalciferol (VITAMIN D) 2000 UNITS tablet, Take 2,000 Units by mouth daily., Disp: , Rfl:  .  fish oil-omega-3 fatty acids 1000 MG capsule, Take 3 g by mouth daily. , Disp: , Rfl:  .  Fluticasone Furoate-Vilanterol (BREO ELLIPTA) 100-25 MCG/INH AEPB, Inhale 1 puff into the lungs daily., Disp: 60 each, Rfl: 0 .  Multiple Vitamins-Minerals (MULTIVITAMIN WITH MINERALS) tablet, Take 1 tablet by mouth daily., Disp: , Rfl:  .  NITROSTAT 0.4 MG SL tablet, USE AS DIRECTED AS NEEDED, Disp: , Rfl: 12 .  Tiotropium Bromide Monohydrate (SPIRIVA RESPIMAT) 2.5 MCG/ACT AERS, Inhale 2 puffs into the lungs 2 (two) times daily., Disp: 1 Inhaler, Rfl: 5

## 2015-12-04 NOTE — Telephone Encounter (Signed)
Called spoke with patient, advised of MR's recs as stated below.  Pt voiced her understanding and denied any questions/concerns.  She is aware to contact the office if her symptoms do not improve or the worsen.  Rx sent to verified pharmacy. Nothing further needed; will sign off.

## 2015-12-04 NOTE — Telephone Encounter (Signed)
Patient has head cold, ? Sinus infection.  A lot of sinus congestion and started having drainage down into chest yesterday.  Coughing up green/ bright yellow mucus.  No fever.  Chest tightness, especially on the right side of chest.  PND, sinus pressure and blowing out yellow/ green from nose as well.    Pharmacy:  [facility] [address]  No Known Allergies

## 2015-12-05 ENCOUNTER — Telehealth: Payer: Self-pay | Admitting: Family Medicine

## 2015-12-05 MED ORDER — ATORVASTATIN CALCIUM 40 MG PO TABS: 40.0000 mg | ORAL_TABLET | Freq: Every day | ORAL | Status: DC

## 2015-12-05 NOTE — Telephone Encounter (Signed)
done

## 2015-12-05 NOTE — Telephone Encounter (Signed)
Recv'd refill request for Atorvastatin 40mg  90 day supply from [facility]

## 2015-12-25 ENCOUNTER — Encounter: Payer: Self-pay | Admitting: Family Medicine

## 2015-12-25 ENCOUNTER — Other Ambulatory Visit (HOSPITAL_COMMUNITY)
Admission: RE | Admit: 2015-12-25 | Discharge: 2015-12-25 | Disposition: A | Discharge status: Home / Self Care | Payer: Medicare Other | Source: Ambulatory Visit | Attending: Family Medicine | Admitting: Family Medicine

## 2015-12-25 ENCOUNTER — Ambulatory Visit (INDEPENDENT_AMBULATORY_CARE_PROVIDER_SITE_OTHER): Payer: Medicare Other | Admitting: Family Medicine

## 2015-12-25 ENCOUNTER — Encounter: Payer: Medicare Other | Admitting: Family Medicine

## 2015-12-25 VITALS — BP 136/80 | HR 72 | Ht 68.5 in | Wt 146.6 lb

## 2015-12-25 DIAGNOSIS — I2510 Atherosclerotic heart disease of native coronary artery without angina pectoris: Secondary | ICD-10-CM | POA: Diagnosis not present

## 2015-12-25 DIAGNOSIS — J449 Chronic obstructive pulmonary disease, unspecified: Secondary | ICD-10-CM

## 2015-12-25 DIAGNOSIS — Z01419 Encounter for gynecological examination (general) (routine) without abnormal findings: Secondary | ICD-10-CM | POA: Diagnosis not present

## 2015-12-25 DIAGNOSIS — Z1151 Encounter for screening for human papillomavirus (HPV): Secondary | ICD-10-CM | POA: Insufficient documentation

## 2015-12-25 DIAGNOSIS — K5900 Constipation, unspecified: Secondary | ICD-10-CM | POA: Diagnosis not present

## 2015-12-25 DIAGNOSIS — Z5181 Encounter for therapeutic drug level monitoring: Secondary | ICD-10-CM | POA: Diagnosis not present

## 2015-12-25 DIAGNOSIS — J479 Bronchiectasis, uncomplicated: Secondary | ICD-10-CM

## 2015-12-25 DIAGNOSIS — E559 Vitamin D deficiency, unspecified: Secondary | ICD-10-CM | POA: Diagnosis not present

## 2015-12-25 DIAGNOSIS — Z23 Encounter for immunization: Secondary | ICD-10-CM | POA: Diagnosis not present

## 2015-12-25 DIAGNOSIS — Z0000 Encounter for general adult medical examination without abnormal findings: Secondary | ICD-10-CM

## 2015-12-25 DIAGNOSIS — I700 Atherosclerosis of aorta: Secondary | ICD-10-CM

## 2015-12-25 LAB — POCT URINALYSIS DIPSTICK
Bilirubin, UA: NEGATIVE
Glucose, UA: NEGATIVE
KETONES UA: NEGATIVE
LEUKOCYTES UA: NEGATIVE
NITRITE UA: NEGATIVE
PH UA: 7.5
PROTEIN UA: NEGATIVE
RBC UA: NEGATIVE
Spec Grav, UA: 1.015
Urobilinogen, UA: NEGATIVE

## 2015-12-25 LAB — LIPID PANEL
Cholesterol: 162 mg/dL (ref 125–200)
HDL: 91 mg/dL (ref >=46)
LDL Cholesterol: 61 mg/dL (ref <130)
TRIGLYCERIDES: 49 mg/dL (ref <150)
Total CHOL/HDL Ratio: 1.8 Ratio (ref <=5.0)
VLDL: 10 mg/dL (ref <30)

## 2015-12-25 LAB — COMPREHENSIVE METABOLIC PANEL
ALBUMIN: 4.3 g/dL (ref 3.6–5.1)
ALK PHOS: 67 U/L (ref 33–130)
ALT: 14 U/L (ref 6–29)
AST: 21 U/L (ref 10–35)
BUN: 16 mg/dL (ref 7–25)
CHLORIDE: 101 mmol/L (ref 98–110)
CO2: 29 mmol/L (ref 20–31)
CREATININE: 0.55 mg/dL — AB (ref 0.60–0.93)
Calcium: 9.8 mg/dL (ref 8.6–10.4)
Glucose, Bld: 75 mg/dL (ref 65–99)
Potassium: 4.6 mmol/L (ref 3.5–5.3)
SODIUM: 139 mmol/L (ref 135–146)
TOTAL PROTEIN: 7.1 g/dL (ref 6.1–8.1)
Total Bilirubin: 1.3 mg/dL — ABNORMAL HIGH (ref 0.2–1.2)

## 2015-12-25 LAB — CBC WITH DIFFERENTIAL/PLATELET
BASOS ABS: 0 10*3/uL (ref 0.0–0.1)
BASOS PCT: 0 % (ref 0–1)
EOS ABS: 0.1 10*3/uL (ref 0.0–0.7)
EOS PCT: 1 % (ref 0–5)
HCT: 38.9 % (ref 36.0–46.0)
Hemoglobin: 13 g/dL (ref 12.0–15.0)
LYMPHS PCT: 25 % (ref 12–46)
Lymphs Abs: 2.3 10*3/uL (ref 0.7–4.0)
MCH: 28.7 pg (ref 26.0–34.0)
MCHC: 33.4 g/dL (ref 30.0–36.0)
MCV: 85.9 fL (ref 78.0–100.0)
MONO ABS: 0.9 10*3/uL (ref 0.1–1.0)
MPV: 9.3 fL (ref 8.6–12.4)
Monocytes Relative: 10 % (ref 3–12)
Neutro Abs: 6 10*3/uL (ref 1.7–7.7)
Neutrophils Relative %: 64 % (ref 43–77)
PLATELETS: 324 10*3/uL (ref 150–400)
RBC: 4.53 MIL/uL (ref 3.87–5.11)
RDW: 14.4 % (ref 11.5–15.5)
WBC: 9.3 10*3/uL (ref 4.0–10.5)

## 2015-12-25 LAB — TSH: TSH: 2.674 u[IU]/mL (ref 0.350–4.500)

## 2015-12-25 NOTE — Progress Notes (Signed)
Chief Complaint  Patient presents with  . Annual Exam    annual wellness/med check with pap, last one 2012. No concerns.    Molly Ross is a 75 y.o. female who presents for physical, annual wellness visit and follow-up on chronic medical conditions.  She has no specific complaints today.  CAD: She was noted to have coronary calcification in 2 vessels on CT scan.She was referred to Dr. Wynonia Lawman and had a myocardial perfusion scan. She exercised 5 minutes 30 seconds into Bruce stage II with no EKG changes. She had no evidence of ischemia with an ejection fraction of 76%. She has not had any recurrent chest pain since then. She has been compliant with taking aspirin and statin. She had some tolerability issues with the 33m atorvastatin (discomfort in her legs).  These symptoms resolved after changing to 444mevery other day (rather than cutting dose in 1/2).  Emphysema and bronchiectasis: Her breathing has improved [place] last seen in November.  Vitamin D deficiency: s/p 12 weeks of rx replacement in February 2016. She continues to take MVI and vitamin D daily.  Immunization History  Administered Date(s) Administered  . Influenza, High Dose Seasonal PF 01/26/2014, 10/19/2014, 08/30/2015   Last Pap smear: 2012 Last mammogram: 01/2015 Last colonoscopy:  06/2007 Dr. HaAmedeo Plentycolonoscopy)--polyp. No path report available to me Last DEXA: 02/2011, normal (T-1.) Dentist: yearly Ophtho: many years Exercise: walks on the job. Home exercises 2-3x/week (stretches and aerobics, non-weightbearing)  Other doctors caring for patient include: Pulmonary: Dr. RaChase Callerardiology: Dr. TiWynonia Lawmanentist: [place] recall his name ([place]:  Dr. HaAmedeo Plenty Depression screen:  Negative Fall Screen: negative Functional Status screen: notable only for needing reading glasses.  See screen in epic.    End of Life Discussion:  Patient does not have a living will and  medical power of attorney  Past Medical History  Diagnosis Date  . Colon polyp   . Unspecified vitamin D deficiency 02/2011 and 2016  . Shingles 1/09 and 07/2010    Past Surgical History  Procedure Laterality Date  . Shoulder surgery  03/2011    for frozen shoulder, and cyst removal (Dr. MuPercell Miller . Tonsillectomy  child    Social History   Social History  . Marital Status: Widowed    Spouse Name: N/A  . Number of Children: N/A  . Years of Education: N/A   Occupational History  . [employer]  Social History Main Topics  . Smoking status: Former Smoker -- 2.00 packs/day for 24 years    Types: Cigarettes    Quit date: 06/28/1980  . Smokeless tobacco: Never Used     Comment: smoked 1-2 PPD x 24 years;  . Alcohol Use: 0.0 oz/week    0 Standard drinks or equivalent per week     Comment: 1 glass of wine 3x/week  . Drug Use: No  . Sexual Activity: Not on file   Other Topics Concern  . Not on file   Social History Narrative   Lives with MaCinda [place]her sister).  Widowed (2011).  [place]   Also has step grandson.   She works at the [employer] [place]  Family History  Problem Relation Age of Onset  . Stroke Mother   . Diabetes Mother   . Stroke Sister [id number]  brainstem stroke  . Diabetes Sister   .  Ovarian cancer Sister 45    metastatic to lung, liver  . Cancer Sister     Outpatient Encounter Prescriptions as of 12/25/2015  Medication Sig Note  . albuterol (PROVENTIL HFA;VENTOLIN HFA) 108 (90 BASE) MCG/ACT inhaler Inhale 2 puffs into the lungs every 6 (six) hours as needed for wheezing or shortness of breath. 12/25/2015: Using 2 puffs once daily  . aspirin EC 81 MG tablet Take 81 mg by mouth daily.   Marland Kitchen atorvastatin (LIPITOR) 40 MG tablet Take 1 tablet (40 mg total) by mouth daily. 12/25/2015: Every other day  . Calcium-Magnesium-Vitamin D (CALCIUM 500 PO) Take 1 tablet by mouth  daily.   . Cholecalciferol (VITAMIN D) 2000 UNITS tablet Take 2,000 Units by mouth daily.   . fish oil-omega-3 fatty acids 1000 MG capsule Take 3 g by mouth daily.  08/30/2015: Takes OMEGA XL-contains 956m per cap and takes 4 per day~1380 daily  . Fluticasone Furoate-Vilanterol (BREO ELLIPTA) 100-25 MCG/INH AEPB Inhale 1 puff into the lungs daily. 08/30/2015: Using 1 puff daily  . Multiple Vitamins-Minerals (MULTIVITAMIN WITH MINERALS) tablet Take 1 tablet by mouth daily.   . Tiotropium Bromide Monohydrate (SPIRIVA RESPIMAT) 2.5 MCG/ACT AERS Inhale 2 puffs into the lungs 2 (two) times daily.   .Marland KitchenNITROSTAT 0.4 MG SL tablet Reported on 12/25/2015 08/30/2015: Received from: External Pharmacy  . [DISCONTINUED] azithromycin (ZITHROMAX) 250 MG tablet Take as directed   . [DISCONTINUED] predniSONE (DELTASONE) 10 MG tablet Take 4 tabs x1 day, 3 tabs x1 day, 2 tabs x1day, 1 tab x1 day, then 1/2 for 1 day and stop.    No facility-administered encounter medications on file as of 12/25/2015.    No Known Allergies  ROS: The patient denies anorexia, fever, weight changes, headaches,  vision changes, decreased hearing, ear pain, sore throat, breast concerns, chest pain, palpitations, dizziness, syncope, dyspnea on exertion, cough, swelling, nausea, vomiting, diarrhea, constipation, abdominal pain, melena, hematochezia, indigestion/heartburn, hematuria, incontinence, dysuria, vaginal bleeding, discharge, odor or itch, genital lesions, joint pains, numbness, tingling, weakness, tremor, suspicious skin lesions, depression, anxiety, abnormal bleeding/bruising, or enlarged lymph nodes. Breathing is improved, only slight cough in the morning. Some burping after eating. denies dysphagia. +constipation.   PHYSICAL EXAM:  BP 136/80 mmHg  Pulse 72  Ht 5' 8.5" (1.74 m)  Wt 146 lb 9.6 oz (66.497 kg)  BMI 21.96 kg/m2  General Appearance:    Alert, cooperative, no distress, appears stated age  Head:    Normocephalic,  without obvious abnormality, atraumatic  Eyes:    PERRL, conjunctiva/corneas clear, EOM's intact, fundi    benign  Ears:    Normal TM's and external ear canals  Nose:   Nares normal, mucosa normal, no drainage or sinus   tenderness  Throat:   Lips, mucosa, and tongue normal; teeth and gums normal  Neck:   Supple, no lymphadenopathy;  thyroid:  no   enlargement/tenderness/nodules; no carotid   bruit or JVD  Back:    Spine nontender, no curvature, ROM normal, no CVA     tenderness  Lungs:     Clear to auscultation bilaterally without wheezes, rales or     ronchi; respirations unlabored. Trace wheeze on left and crackle on right, both of which cleared with deep breaths  Chest Wall:    No tenderness or deformity   Heart:    Regular rate and rhythm, S1 and S2 normal, no murmur, rub   or gallop  Breast Exam:    No tenderness, masses, or nipple discharge or  inversion.      No axillary lymphadenopathy  Abdomen:     Soft, non-tender, nondistended, normoactive bowel sounds,    no masses, no hepatosplenomegaly  Genitalia:    Normal external genitalia without lesions.  BUS and vagina normal; cervix without lesions, or cervical motion tenderness. No abnormal vaginal discharge.  Uterus and adnexa not enlarged, nontender, no masses.  Pap performed  Rectal:    Normal tone, no masses or tenderness; guaiac negative stool  Extremities:   No clubbing, cyanosis or edema  Pulses:   2+ and symmetric all extremities  Skin:   Skin color, texture, turgor normal, no lesions. She has many seborrheic keratoses throughout her body many of which are large, largest being the one on her right lateral breast.  There are also many scattered angiomas. No suspicious lesions  Lymph nodes:   Cervical, supraclavicular, and axillary nodes normal  Neurologic:   CNII-XII intact, normal strength, sensation and gait; reflexes 2+ and symmetric throughout          Psych:   Normal mood, affect, hygiene and grooming.    Normal urine  dip   ASSESSMENT/PLAN:  Annual physical exam - Plan: Visual acuity screening, POCT Urinalysis Dipstick, Lipid panel, Comprehensive metabolic panel, CBC with Differential/Platelet, VITAMIN D 25 Hydroxy (Vit-D Deficiency, Fractures), TSH, Cytology - PAP [place]  Coronary artery disease involving native coronary artery of native heart without angina pectoris - asymptomatic. Continue aspirin, statin. regular exercise encouraged.  Vitamin D deficiency - continue OTC supplementation - Plan: VITAMIN D 25 Hydroxy (Vit-D Deficiency, Fractures)  Atherosclerosis of aorta (HCC)  Bronchiectasis without complication (HCC)  COPD, moderate (HCC) - stable; continue Breo and f/u with pulmonary  Medication monitoring encounter - Plan: Lipid panel, Comprehensive metabolic panel, CBC with Differential/Platelet, VITAMIN D 25 Hydroxy (Vit-D Deficiency, Fractures)  Constipation, unspecified constipation type - discussed high fiber diet, adequate fluid intake, fiber supplements, probiotics - Plan: TSH  Immunization due - prevnar-13 given; written rx to get TdaP and zostavax at pharmacy. risks/side effects reviewed. - Plan: Pneumococcal conjugate vaccine 13-valent  Encounter for Medicare annual wellness exam   Tdap--not covered by insurance to give here.  Written rx given for patient to get at pharmacy Zostavax--Written rx given for patient to get at pharmacy prevnar-13 given today; will need pneumovax in a year.  Lipid, c-met, Vit D, CBC, TSH (due to constipation that is new)  Constipation: fiber, water, exercise, consider probiotic  MOST form was discussed and completed. Full Code, Full Care  F/u 6 months (might be okay for a year, depending on labs, possibly just nonfasting lab visit for LFT's if lipids are at goal.   Discussed monthly self breast exams and yearly mammograms; at least 30 minutes of aerobic activity at least 5 days/week and weight-bearing exercise 2x/week; proper sunscreen use  reviewed; healthy diet, including goals of calcium and vitamin D intake and alcohol recommendations (less than or equal to 1 drink/day) reviewed; regular seatbelt use; changing batteries in smoke detectors.  Immunization recommendations discussed--see above.  Colonoscopy recommendations reviewed--pt to check with Dr. Amedeo Plenty. Depending on the pathology of the polyp she had on last colonoscopy, she may be past due (vs due next year)  Living will and healthcare power of attorney information given and discussed.   Medicare Attestation I have personally reviewed: The patient's medical and social history Their use of alcohol, tobacco or illicit drugs Their current medications and supplements The patient's functional ability including ADLs,fall risks, home safety risks, cognitive, and hearing  and visual impairment Diet and physical activities Evidence for depression or mood disorders  The patient's weight, height, and BMI have been recorded in the chart.  I have made referrals, counseling, and provided education to the patient based on review of the above and I have provided the patient with a written personalized care plan for preventive services.     Earma Nicolaou A, MD   12/25/2015

## 2015-12-25 NOTE — Patient Instructions (Addendum)
HEALTH MAINTENANCE RECOMMENDATIONS:  It is recommended that you get at least 30 minutes of aerobic exercise at least 5 days/week (for weight loss, you may need as much as 60-90 minutes). This can be any activity that gets your heart rate up. This can be divided in 10-15 minute intervals if needed, but try and build up your endurance at least once a week.  Weight bearing exercise is also recommended twice weekly.  Eat a healthy diet with lots of vegetables, fruits and fiber.  "Colorful" foods have a lot of vitamins (ie green vegetables, tomatoes, red peppers, etc).  Limit sweet tea, regular sodas and alcoholic beverages, all of which has a lot of calories and sugar.  Up to 1 alcoholic drink daily may be beneficial for women (unless trying to lose weight, watch sugars).  Drink a lot of water.  Calcium recommendations are 1200-1500 mg daily (1500 mg for postmenopausal women or women without ovaries), and vitamin D 1000 IU daily.  This should be obtained from diet and/or supplements (vitamins), and calcium should not be taken all at once, but in divided doses.  Monthly self breast exams and yearly mammograms for women over the age of 58 is recommended.  Sunscreen of at least SPF 30 should be used on all sun-exposed parts of the skin when outside between the hours of 10 am and 4 pm (not just when at beach or pool, but even with exercise, golf, tennis, and yard work!)  Use a sunscreen that says "broad spectrum" so it covers both UVA and UVB rays, and make sure to reapply every 1-2 hours.  Remember to change the batteries in your smoke detectors when changing your clock times in the spring and fall.  Use your seat belt every time you are in a car, and please drive safely and not be distracted with cell phones and texting while driving.  Constipation, Adult Constipation is when a person has fewer than three bowel movements a week, has difficulty having a bowel movement, or has stools that are dry, hard,  or larger than normal. As people grow older, constipation is more common. A low-fiber diet, not taking in enough fluids, and taking certain medicines may make constipation worse.  CAUSES   Certain medicines, such as antidepressants, pain medicine, iron supplements, antacids, and water pills.   Certain diseases, such as diabetes, irritable bowel syndrome (IBS), thyroid disease, or depression.   Not drinking enough water.   Not eating enough fiber-rich foods.   Stress or travel.   Lack of physical activity or exercise.   Ignoring the urge to have a bowel movement.   Using laxatives too much.  SIGNS AND SYMPTOMS   Having fewer than three bowel movements a week.   Straining to have a bowel movement.   Having stools that are hard, dry, or larger than normal.   Feeling full or bloated.   Pain in the lower abdomen.   Not feeling relief after having a bowel movement.  DIAGNOSIS  Your health care provider will take a medical history and perform a physical exam. Further testing may be done for severe constipation. Some tests may include:  A barium enema X-ray to examine your rectum, colon, and, sometimes, your small intestine.   A sigmoidoscopy to examine your lower colon.   A colonoscopy to examine your entire colon. TREATMENT  Treatment will depend on the severity of your constipation and what is causing it. Some dietary treatments include drinking more fluids and eating more fiber-rich  foods. Lifestyle treatments may include regular exercise. If these diet and lifestyle recommendations do not help, your health care provider may recommend taking over-the-counter laxative medicines to help you have bowel movements. Prescription medicines may be prescribed if over-the-counter medicines do not work.  HOME CARE INSTRUCTIONS   Eat foods that have a lot of fiber, such as fruits, vegetables, whole grains, and beans.  Limit foods high in fat and processed sugars, such as  french fries, hamburgers, cookies, candies, and soda.   A fiber supplement may be added to your diet if you cannot get enough fiber from foods.   Drink enough fluids to keep your urine clear or pale yellow.   Exercise regularly or as directed by your health care provider.   Go to the restroom when you have the urge to go. Do not hold it.   Only take over-the-counter or prescription medicines as directed by your health care provider. Do not take other medicines for constipation without talking to your health care provider first.  [place] IF:   You have bright red blood in your stool.   Your constipation lasts for more than 4 days or gets worse.   You have abdominal or rectal pain.   You have thin, pencil-like stools.   You have unexplained weight loss. MAKE SURE YOU:   Understand these instructions.  Will watch your condition.  Will get help right away if you are not doing well or get worse.   This information is not intended to replace advice given to you by your health care provider. Make sure you discuss any questions you have with your health care provider.   Document Released: 08/30/2004 Document Revised: 12/23/2014 Document Reviewed: 09/13/2013 Elsevier Interactive Patient Education 2016 [place].   Molly Ross , Thank you for taking time to come for your Medicare Wellness Visit. I appreciate your ongoing commitment to your health goals. Please review the following plan we discussed and let me know if I can assist you in the future.   These are the goals we discussed: Goals    None      This is a list of the screening recommended for you and due dates:  Health Maintenance  Topic Date Due  . Tetanus Vaccine  11/08/1960  . Shingles Vaccine  11/08/2001  . Pneumonia vaccines (1 of 2 - PCV13) 11/08/2006  . Flu Shot  07/16/2016  . Mammogram  01/19/2017  . Colon Cancer Screening  06/24/2017  . DEXA scan (bone density measurement)   Completed   Check with Dr. Amedeo Plenty to see when your colonoscopy is due.  If the polyp was completely benign, and 10 year follow up was recommended, then the 2018 date is correct.  If the polyp was adenomatous (I don't have the records of the result), then it is usually repeated sooner, in which case you are past due.  Your mammogram is due YEARLY, in February 2017 (above states 2 year guidelines). We are giving you your pneumonia vaccine today (and will need another next year). Tetanus and shingles vaccines are recommended. These are not covered by your insurance to be given in our office.  Take the written prescription to a pharmacy to get. These need to be given at the pharmacy. Please let us know when you get them. You need to wait 30 days from today before getting the shingles vaccine.

## 2015-12-26 LAB — VITAMIN D 25 HYDROXY (VIT D DEFICIENCY, FRACTURES): VIT D 25 HYDROXY: 39 ng/mL (ref 30–100)

## 2015-12-27 ENCOUNTER — Other Ambulatory Visit: Payer: Self-pay | Admitting: *Deleted

## 2015-12-27 DIAGNOSIS — Z79899 Other long term (current) drug therapy: Secondary | ICD-10-CM

## 2015-12-27 LAB — CYTOLOGY - PAP

## 2015-12-28 ENCOUNTER — Encounter: Payer: Self-pay | Admitting: Family Medicine

## 2016-01-01 DIAGNOSIS — H2513 Age-related nuclear cataract, bilateral: Secondary | ICD-10-CM | POA: Diagnosis not present

## 2016-01-02 ENCOUNTER — Other Ambulatory Visit: Payer: Self-pay | Admitting: Family Medicine

## 2016-01-02 DIAGNOSIS — Z139 Encounter for screening, unspecified: Secondary | ICD-10-CM

## 2016-01-24 ENCOUNTER — Ambulatory Visit (INDEPENDENT_AMBULATORY_CARE_PROVIDER_SITE_OTHER): Payer: Medicare Other

## 2016-01-24 DIAGNOSIS — Z139 Encounter for screening, unspecified: Secondary | ICD-10-CM

## 2016-01-24 DIAGNOSIS — Z1231 Encounter for screening mammogram for malignant neoplasm of breast: Secondary | ICD-10-CM

## 2016-01-30 ENCOUNTER — Telehealth: Payer: Self-pay | Admitting: Internal Medicine

## 2016-01-30 MED ORDER — AZITHROMYCIN 250 MG PO TABS: ORAL_TABLET | ORAL | Status: AC

## 2016-01-30 MED ORDER — PREDNISONE 10 MG PO TABS
ORAL_TABLET | ORAL | Status: DC
Start: 2016-01-30 — End: 2016-02-12

## 2016-01-30 NOTE — Telephone Encounter (Signed)
ATC receive busy line signal>>WCB

## 2016-01-30 NOTE — Telephone Encounter (Signed)
Patient called after hours saying she had called earlier today regarding bronchiectasis flare but never received a call back. Notes mild dyspnea, chills, increased sputum production.  Said that Zpack and prednisone taper worked last time.  Rx sent for zpack and prednisone taper.  Will route to triage and Dr. Chase Caller for [facility]

## 2016-01-31 ENCOUNTER — Telehealth: Payer: Self-pay | Admitting: Internal Medicine

## 2016-01-31 NOTE — Telephone Encounter (Signed)
Molly Doom, MD at 01/30/2016 6:12 PM     Status: Signed       Expand All Collapse All   Patient called after hours saying she had called earlier today regarding bronchiectasis flare but never received a call back. Notes mild dyspnea, chills, increased sputum production.  Said that Zpack and prednisone taper worked last time.  Rx sent for zpack and prednisone taper.  Will route to triage and Dr. Chase Caller for [place] spoke with pt. She reports she spoke with oncall doc last night and received her medications. She is feeling better. She did not want to schedule appt at this time. Pt aware to contact us if she worsens for appt. Nothing further needed

## 2016-02-12 ENCOUNTER — Encounter: Payer: Self-pay | Admitting: Internal Medicine

## 2016-02-12 ENCOUNTER — Ambulatory Visit (INDEPENDENT_AMBULATORY_CARE_PROVIDER_SITE_OTHER): Payer: Medicare Other | Admitting: Internal Medicine

## 2016-02-12 ENCOUNTER — Other Ambulatory Visit: Payer: Medicare Other

## 2016-02-12 VITALS — BP 120/70 | HR 72 | Ht 69.0 in | Wt 149.6 lb

## 2016-02-12 DIAGNOSIS — J479 Bronchiectasis, uncomplicated: Secondary | ICD-10-CM

## 2016-02-12 LAB — RHEUMATOID FACTOR: Rhuematoid fact SerPl-aCnc: <10 IU/mL (ref <=14)

## 2016-02-12 NOTE — Progress Notes (Signed)
Subjective:     Patient ID: Molly Ross, female   DOB: September 02, 1941, 75 y.o.   MRN: [id number]  HPI   IOV 06/09/2015  Chief Complaint  Patient presents with  . Pulmonary Consult    Pt referred by Dr. Tomi Bamberger for abnormal CT.    75 year old female somewhat of a poor historian. History is gained from repeatedly asking the same questions in many different ways and also review of Dr Rita Ohara notes in the EMR   She reports that she is a 40 pack smoker having quit 34 years ago. In the 1970s and 1980s she had recurrent bouts of pneumonia which left her with bronchiectasis. At baseline she's had some chronic cough of unclear severity and shortness of breath with exertion of unclear severity but probably mild. Unclear if she has associated sputum. Unclear what aggravating and relieving factors where or are. Then in for a 2016 she developed over several weeks worsening cough with green sputum and some fever. Apparently was diagnosed with pneumonia. This then resulted in a CT scan of the chest and her 2016 which I personally reviewed and shows bilateral bronchiectasis particularly on the right middle lobe. She also had a nodule. Antibody treatment she improved. Then in April 2016 was started on Brio which inhaler therapy help her cough and shortness of breath significantly. Currently these are not much much of an issue for her. She has a good quality of life with the Brio. The Brio is expensive. She had follow-up CT scan of the chest 04/26/2015 that shows resolution of the nodule but persistence of the bronchiectasis. In fact she seems to have chronic bronchiectasis dating back on several chest x-rays although it back to 2009. She had test that she's had bronchiectasis since the 1980s. Currently no sputum production not much of a cough or shortness of breath. She denies having had bronchoscopy for the same.  She is not interested in much testing  Noted spirometry pre-breo March 2016  - fev 1.14L/45%, R 56 -  severe obstructive lung disease And post Breo in June 05 2015  - Fev 1.32L/52%, R 57 -> significantly improved but still in moderate category   OV 11/06/2015  Chief Complaint  Patient presents with  . Follow-up    Pt here after PFT. Pt states she respiratory infection in October and was on 2 rounds of abx and is now back to her baseline. Pt c/o mild non prod cough. Pt denies CP/tightness.    Previous heavy smoker with right middle lobe chronic bronchiectasis with findings suggestive of MAI and moderate/severe obstructive lung disease with findings of emphysema on CT chest - February and May 2016  - Last seen in June 2016. At that time he started Brio. Overall she has been doing well. However in October 2016 she had a respiratory exacerbation. She was treated with 2 rounds of anabiotic's and prednisone course and then she fell baseline. She is followed up with a pulmonary function test  t 10/30/2015) baseline and well. 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 Currently she is feeling well. Review of vaccine shows she is up-to-date with flu shot. She thinks she has had the pneumonia vaccine with her primary care physician but she's not sure and she is going to check with her.  No new issues.  OV 02/12/2016  Chief Complaint  Patient presents with  . Follow-up  Pt reports having some increased breathing issues since last OV- treated a few times with abx/pred for infections. Most recent x 1 week ago- Zpak w/Pred taper. Pt notes some green mucus but cough is much improved.   Heavy smoker with right middle lobe chronic bronchiectasis and some lingular bronchiectasis and scattered infiltrates in the lower lobe based on CT February and May 2016. May 2016 with improvement   - This is a routine follow-up. Last seen November 2016. She is compliant with her inhalers. In the interim approximately on  Valentine's Day 2017 had another flareup and he called in Z-Pak and prednisone. This has helped. Cough is significantly worse but it has improved although not at baseline. She slowly getting better. This will be her third exacerbation. Review of the chart shows an visualization of the CT chest which I did myself and showed her shows findings described above. Chart review also shows that do not had a complete bronchiectasis hematological and etiological workup. She is open to having bronchoscopy    has a past medical history of Colon polyp; Unspecified vitamin D deficiency (02/2011 and 2016); and Shingles (1/09 and 07/2010).   reports that she quit smoking about 35 years ago. Her smoking use included Cigarettes. She has a 48 pack-year smoking history. She has never used smokeless tobacco.  Past Surgical History  Procedure Laterality Date  . Shoulder surgery  03/2011    for frozen shoulder, and cyst removal (Dr. Percell Miller)  . Tonsillectomy  child    No Known Allergies  Immunization History  Administered Date(s) Administered  . Influenza, High Dose Seasonal PF 01/26/2014, 10/19/2014, 08/30/2015  . Pneumococcal Conjugate-13 12/25/2015    Family History  Problem Relation Age of Onset  . Stroke Mother   . Diabetes Mother   . Stroke Sister 19    brainstem stroke  . Diabetes Sister   . Ovarian cancer Sister 54    metastatic to lung, liver  . Cancer Sister      Current outpatient prescriptions:  .  albuterol (PROVENTIL HFA;VENTOLIN HFA) 108 (90 BASE) MCG/ACT inhaler, Inhale 2 puffs into the lungs every 6 (six) hours as needed for wheezing or shortness of breath., Disp: 1 Inhaler, Rfl: 0 .  aspirin EC 81 MG tablet, Take 81 mg by mouth daily., Disp: , Rfl:  .  atorvastatin (LIPITOR) 40 MG tablet, Take 1 tablet (40 mg total) by mouth daily., Disp: 90 tablet, Rfl: 1 .  Calcium-Magnesium-Vitamin D (CALCIUM 500 PO), Take 1 tablet by mouth daily., Disp: , Rfl:  .  Cholecalciferol (VITAMIN D) 2000  UNITS tablet, Take 2,000 Units by mouth daily., Disp: , Rfl:  .  fish oil-omega-3 fatty acids 1000 MG capsule, Take 3 g by mouth daily. , Disp: , Rfl:  .  Fluticasone Furoate-Vilanterol (BREO ELLIPTA) 100-25 MCG/INH AEPB, Inhale 1 puff into the lungs daily., Disp: 60 each, Rfl: 0 .  Multiple Vitamins-Minerals (MULTIVITAMIN WITH MINERALS) tablet, Take 1 tablet by mouth daily., Disp: , Rfl:  .  NITROSTAT 0.4 MG SL tablet, Reported on 12/25/2015, Disp: , Rfl: 12 .  Tiotropium Bromide Monohydrate (SPIRIVA RESPIMAT) 2.5 MCG/ACT AERS, Inhale 2 puffs into the lungs 2 (two) times daily., Disp: 1 Inhaler, Rfl: 5     Review of Systems According to history of present illness    Objective:   Physical Exam  Constitutional: She is oriented to person, place, and time. She appears well-developed and well-nourished. No distress.  HENT:  Head: Normocephalic and atraumatic.  Right Ear:  External ear normal.  Left Ear: External ear normal.  Mouth/Throat: Oropharynx is clear and moist. No oropharyngeal exudate.  Eyes: Conjunctivae and EOM are normal. Pupils are equal, round, and reactive to light. Right eye exhibits no discharge. Left eye exhibits no discharge. No scleral icterus.  Neck: Normal range of motion. Neck supple. No JVD present. No tracheal deviation present. No thyromegaly present.  Cardiovascular: Normal rate, regular rhythm, normal heart sounds and intact distal pulses.  Exam reveals no gallop and no friction rub.   No murmur heard. Pulmonary/Chest: Effort normal and breath sounds normal. No respiratory distress. She has no wheezes. She has no rales. She exhibits no tenderness.  Abdominal: Soft. Bowel sounds are normal. She exhibits no distension and no mass. There is no tenderness. There is no rebound and no guarding.  Musculoskeletal: Normal range of motion. She exhibits no edema or tenderness.  Lymphadenopathy:    She has no cervical adenopathy.  Neurological: She is alert and oriented to  person, place, and time. She has normal reflexes. No cranial nerve deficit. She exhibits normal muscle tone. Coordination normal.  Skin: Skin is warm and dry. No rash noted. She is not diaphoretic. No erythema. No pallor.  Psychiatric: She has a normal mood and affect. Her behavior is normal. Judgment and thought content normal.  Vitals reviewed.  Filed Vitals:   02/12/16 1337  BP: 120/70  Pulse: 72  Height: 5\' 9"  (1.753 m)  Weight: 149 lb 9.6 oz (67.858 kg)  SpO2: 97%       Assessment:       ICD-9-CM ICD-10-CM   1. Bronchiectasis without complication (HCC) A999333 J47.9 Immunoglobulins, IgG, IgA, IgM     Cyclic citrul peptide antibody, IgG     Rheumatoid factor     ANA, IFA Comprehensive Panel     Alpha-1 antitrypsin phenotype     Aspergillus IgE Panel     IgE     Allergen, A fumigatus IgG     CT Chest Wo Contrast       Plan:      Do blood work 02/12/2016 - see above  Do CT chest end appril /early may 2017  folllowup  - after ct chest in end April / early may 2017  - discussion on bronchoscopy at time of next visit  - call sooner if needed  > 50% of this > 25 min visit spent in face to face counseling or coordination of care    Dr. Brand Males, M.D., [facility].C.P Pulmonary and Critical Care Medicine Staff Physician [facility] Pager: [phone], If no answer or between  15:00h - 7:00h: call [phone]  02/12/2016 2:03 PM

## 2016-02-12 NOTE — Addendum Note (Signed)
Addended by: Emi Holes on: 02/12/2016 02:59 PM   Modules accepted: Orders

## 2016-02-12 NOTE — Patient Instructions (Signed)
ICD-9-CM ICD-10-CM   1. Bronchiectasis without complication (HCC) A999333 J47.9 Immunoglobulins, IgG, IgA, IgM     Cyclic citrul peptide antibody, IgG     Rheumatoid factor     ANA, IFA Comprehensive Panel     Alpha-1 antitrypsin phenotype     Aspergillus IgE Panel     IgE     Allergen, A fumigatus IgG     CT Chest Wo Contrast    Do blood work 02/12/2016 Do CT chest end appril /early may 2017  folllowup  - after ct chest in end April / early may 2017  - discussion on bronchoscopy at time of next visit  - call sooner if needed

## 2016-02-13 LAB — ANA, IFA COMPREHENSIVE PANEL
Anti Nuclear Antibody(ANA): NEGATIVE
ENA SM Ab Ser-aCnc: <1
SM/RNP: NEGATIVE
SSA (RO) (ENA) ANTIBODY, IGG: NEGATIVE
SSB (La) (ENA) Antibody, IgG: <1
Scleroderma (Scl-70) (ENA) Antibody, IgG: <1
ds DNA Ab: <1 IU/mL

## 2016-02-13 LAB — CYCLIC CITRUL PEPTIDE ANTIBODY, IGG

## 2016-02-13 LAB — IGE: IGE (IMMUNOGLOBULIN E), SERUM: 6 kU/L (ref <115)

## 2016-02-14 LAB — IGG, IGA, IGM
IGG (IMMUNOGLOBIN G), SERUM: 967 mg/dL (ref 690–1700)
IGM, SERUM: 59 mg/dL (ref 52–322)
IgA: 377 mg/dL (ref 69–380)

## 2016-02-15 LAB — ASPERGILLUS IGE PANEL
Aspergillus Amstel/glauc IgE: <0.35 kU/L (ref <0.35)
Aspergillus IgE Panel: <0.1 kU/L (ref <0.35)
Aspergillus Terreus IgE: <0.35 kU/L (ref <0.35)
Aspergillus flavus IgE: <0.35 kU/L (ref <0.35)
Aspergillus nidulans IgE: <0.35 kU/L (ref <0.35)
CLASS: 0
CLASS: 0
CLASS: 0
Class: 0
Class: 0
Class: 0
Class: 0

## 2016-02-15 LAB — ALPHA-1 ANTITRYPSIN PHENOTYPE: A-1 Antitrypsin: 170 mg/dL (ref 83–199)

## 2016-02-16 ENCOUNTER — Other Ambulatory Visit: Payer: Self-pay | Admitting: Medical

## 2016-02-16 LAB — ALLERGEN A FUMIGATUS IGG

## 2016-02-16 NOTE — Progress Notes (Signed)
Quick Note:  Called and spoke to pt. Informed her of the results and recs per MR. Pt verbalized understanding and denied any further questions or concerns at this time.   ______ 

## 2016-02-19 ENCOUNTER — Other Ambulatory Visit: Payer: Self-pay | Admitting: *Deleted

## 2016-02-19 MED ORDER — ALBUTEROL SULFATE HFA 108 (90 BASE) MCG/ACT IN AERS: 2.0000 | INHALATION_SPRAY | Freq: Four times a day (QID) | RESPIRATORY_TRACT | Status: DC | PRN

## 2016-03-02 ENCOUNTER — Telehealth: Payer: Self-pay | Admitting: Family Medicine

## 2016-03-02 NOTE — Telephone Encounter (Signed)
Recv'd letter from [facility]  Ventolin is no longer a covered medication, preferred alternative Proair HFA or Proair Respiclick Do you want to switch?

## 2016-03-03 NOTE — Telephone Encounter (Signed)
Yes.  Please switch to the proair Respiclick

## 2016-03-04 ENCOUNTER — Telehealth: Payer: Self-pay | Admitting: Family Medicine

## 2016-03-04 MED ORDER — ALBUTEROL SULFATE 108 (90 BASE) MCG/ACT IN AEPB: 2.0000 | INHALATION_SPRAY | Freq: Four times a day (QID) | RESPIRATORY_TRACT | Status: DC

## 2016-03-04 NOTE — Telephone Encounter (Signed)
Cancelled Ventolin Rx & changed to Proair Respiclick per Dr. Tomi Bamberger

## 2016-04-15 ENCOUNTER — Ambulatory Visit (INDEPENDENT_AMBULATORY_CARE_PROVIDER_SITE_OTHER)
Admission: RE | Admit: 2016-04-15 | Discharge: 2016-04-15 | Disposition: A | Discharge status: Home / Self Care | Payer: Medicare Other | Source: Ambulatory Visit | Attending: Internal Medicine | Admitting: Internal Medicine

## 2016-04-15 ENCOUNTER — Inpatient Hospital Stay: Admission: RE | Admit: 2016-04-15 | Payer: Medicare Other | Source: Ambulatory Visit

## 2016-04-15 DIAGNOSIS — J479 Bronchiectasis, uncomplicated: Secondary | ICD-10-CM

## 2016-04-15 DIAGNOSIS — R05 Cough: Secondary | ICD-10-CM | POA: Diagnosis not present

## 2016-04-18 NOTE — Progress Notes (Signed)
Quick Note:  lmtcb for pt. ______ 

## 2016-04-19 NOTE — Progress Notes (Signed)
Quick Note:  lmtcb for pt. ______ 

## 2016-04-22 ENCOUNTER — Telehealth: Payer: Self-pay | Admitting: Internal Medicine

## 2016-04-22 NOTE — Telephone Encounter (Signed)
Result Notes     Notes Recorded by Collier Salina, RN on 04/19/2016 at 5:09 PM lmtcb for pt. ------  Notes Recorded by Collier Salina, RN on 04/18/2016 at 5:32 PM lmtcb for pt. ------  Notes Recorded by Brand Males, MD on 04/18/2016 at 4:17 PM Stable bronchiectasis x 1 year   Pt is aware of results. Nothing further was needed.

## 2016-04-23 ENCOUNTER — Ambulatory Visit (INDEPENDENT_AMBULATORY_CARE_PROVIDER_SITE_OTHER): Payer: Medicare Other | Admitting: Internal Medicine

## 2016-04-23 ENCOUNTER — Encounter: Payer: Self-pay | Admitting: Internal Medicine

## 2016-04-23 VITALS — BP 114/60 | HR 78 | Ht 69.0 in | Wt 144.8 lb

## 2016-04-23 DIAGNOSIS — J479 Bronchiectasis, uncomplicated: Secondary | ICD-10-CM

## 2016-04-23 NOTE — Patient Instructions (Signed)
ICD-9-CM ICD-10-CM   1. Bronchiectasis without complication ([place]) A999333 J47.9     idiopathich bronchiectasis - cause unknonw Bronchoscopy indicated Glad you are better with inhalers  Plan  - cotninue inhalers  0 will get a early July 2017 date for bronch and get back to you  followup  - mid July 2017 post bronch

## 2016-04-23 NOTE — Progress Notes (Signed)
IOV 06/09/2015  Chief Complaint  Patient presents with  . Pulmonary Consult    Pt referred by Dr. Tomi Bamberger for abnormal CT.    75 year old female somewhat of a poor historian. History is gained from repeatedly asking the same questions in many different ways and also review of Dr Rita Ohara notes in the EMR   She reports that she is a 40 pack smoker having quit 34 years ago. In the 1970s and 1980s she had recurrent bouts of pneumonia which left her with bronchiectasis. At baseline she's had some chronic cough of unclear severity and shortness of breath with exertion of unclear severity but probably mild. Unclear if she has associated sputum. Unclear what aggravating and relieving factors where or are. Then in for a 2016 she developed over several weeks worsening cough with green sputum and some fever. Apparently was diagnosed with pneumonia. This then resulted in a CT scan of the chest and her 2016 which I personally reviewed and shows bilateral bronchiectasis particularly on the right middle lobe. She also had a nodule. Antibody treatment she improved. Then in April 2016 was started on Brio which inhaler therapy help her cough and shortness of breath significantly. Currently these are not much much of an issue for her. She has a good quality of life with the Brio. The Brio is expensive. She had follow-up CT scan of the chest 04/26/2015 that shows resolution of the nodule but persistence of the bronchiectasis. In fact she seems to have chronic bronchiectasis dating back on several chest x-rays although it back to 2009. She had test that she's had bronchiectasis since the 1980s. Currently no sputum production not much of a cough or shortness of breath. She denies having had bronchoscopy for the same.  She is not interested in much testing  Noted spirometry pre-breo March 2016  - fev 1.14L/45%, R 56 - severe obstructive lung disease And post Breo in June 05 2015  - Fev 1.32L/52%, R 57 -> significantly  improved but still in moderate category   OV 11/06/2015  Chief Complaint  Patient presents with  . Follow-up    Pt here after PFT. Pt states she respiratory infection in October and was on 2 rounds of abx and is now back to her baseline. Pt c/o mild non prod cough. Pt denies CP/tightness.    Previous heavy smoker with right middle lobe chronic bronchiectasis with findings suggestive of MAI and moderate/severe obstructive lung disease with findings of emphysema on CT chest - February and May 2016  - Last seen in June 2016. At that time he started Brio. Overall she has been doing well. However in October 2016 she had a respiratory exacerbation. She was treated with 2 rounds of anabiotic's and prednisone course and then she fell baseline. She is followed up with a pulmonary function test  t 10/30/2015) baseline and well. 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 Currently she is feeling well. Review of vaccine shows she is up-to-date with flu shot. She thinks she has had the pneumonia vaccine with her primary care physician but she's not sure and she is going to check with her.  No new issues.  OV 02/12/2016  Chief Complaint  Patient presents with  . Follow-up    Pt reports having some increased breathing issues since last OV- treated a few times with abx/pred for infections. Most recent x 1 week ago- Zpak  w/Pred taper. Pt notes some green mucus but cough is much improved.   Heavy smoker with right middle lobe chronic bronchiectasis and some lingular bronchiectasis and scattered infiltrates in the lower lobe based on CT February and May 2016. May 2016 with improvement   - This is a routine follow-up. Last seen November 2016. She is compliant with her inhalers. In the interim approximately on Valentine's Day 2017 had another flareup and he called in Z-Pak and prednisone. This has helped. Cough is  significantly worse but it has improved although not at baseline. She slowly getting better. This will be her third exacerbation. Review of the chart shows an visualization of the CT chest which I did myself and showed her shows findings described above. Chart review also shows that do not had a complete bronchiectasis hematological and etiological workup. She is open to having bronchoscopy   OV 04/23/2016  Chief Complaint  Patient presents with  . Follow-up    Pt here after CT chest. Pt states her breathing has improved since last OV. Pt states she is still coughing in the morning but now there is less mucus production.      Heavy smoker with right middle lobe chronic bronchiectasis and some lingular bronchiectasis and scattered infiltrates in the lower lobe based on CT February and May 2016. PFT Nov 2016 -> fev 1.36L/51%, R 60. DLCO 16/51%   In  Nov 2016 I added spiriva and now on triple inhaler Rx- says with this symptoms she is significantly better. Still with cough. Some spuitum +. Labs for etiology reviewed done feb 2017 -> IgG, IgA, IgM - normal. Allpha 1AT - normal. Autoimmune - ANA, CCP, dsDNA, RF, ENA, SSA, SSB, scl-70, RNP - negative.       has a past medical history of Colon polyp; Unspecified vitamin D deficiency (02/2011 and 2016); and Shingles (1/09 and 07/2010).   reports that she quit smoking about 35 years ago. Her smoking use included Cigarettes. She has a 48 pack-year smoking history. She has never used smokeless tobacco.  Past Surgical History  Procedure Laterality Date  . Shoulder surgery  03/2011    for frozen shoulder, and cyst removal (Dr. Percell Miller)  . Tonsillectomy  child    No Known Allergies  Immunization History  Administered Date(s) Administered  . Influenza, High Dose Seasonal PF 01/26/2014, 10/19/2014, 08/30/2015  . Pneumococcal Conjugate-13 12/25/2015    Family History  Problem Relation Age of Onset  . Stroke Mother   . Diabetes Mother   . Stroke  Sister 33    brainstem stroke  . Diabetes Sister   . Ovarian cancer Sister 79    metastatic to lung, liver  . Cancer Sister      Current outpatient prescriptions:  .  Albuterol Sulfate (PROAIR RESPICLICK) [id number] (90 Base) MCG/ACT AEPB, Inhale 2 puffs into the lungs every 6 (six) hours. Prn wheezing or shortness of breath, Disp: 1 each, Rfl: 0 .  aspirin EC 81 MG tablet, Take 81 mg by mouth daily., Disp: , Rfl:  .  atorvastatin (LIPITOR) 40 MG tablet, Take 1 tablet (40 mg total) by mouth daily., Disp: 90 tablet, Rfl: 1 .  Calcium-Magnesium-Vitamin D (CALCIUM 500 PO), Take 1 tablet by mouth daily., Disp: , Rfl:  .  Cholecalciferol (VITAMIN D) 2000 UNITS tablet, Take 2,000 Units by mouth daily., Disp: , Rfl:  .  fish oil-omega-3 fatty acids 1000 MG capsule, Take 3 g by mouth daily. , Disp: , Rfl:  .  Fluticasone  Furoate-Vilanterol (BREO ELLIPTA) 100-25 MCG/INH AEPB, Inhale 1 puff into the lungs daily., Disp: 60 each, Rfl: 0 .  Multiple Vitamins-Minerals (MULTIVITAMIN WITH MINERALS) tablet, Take 1 tablet by mouth daily., Disp: , Rfl:  .  NITROSTAT 0.4 MG SL tablet, Reported on 12/25/2015, Disp: , Rfl: 12 .  Tiotropium Bromide Monohydrate (SPIRIVA RESPIMAT) 2.5 MCG/ACT AERS, Inhale 2 puffs into the lungs 2 (two) times daily. (Patient taking differently: Inhale 2 puffs into the lungs as needed. ), Disp: 1 Inhaler, Rfl: 5   OBJECTIVE  Filed Vitals:   04/23/16 1216  BP: 114/60  Pulse: 78  Height: 5\' 9"  (1.753 m)  Weight: 144 lb 12.8 oz (65.681 kg)  SpO2: 95%   Exam Gen: looks well Resp: CTA bilaterllay  A/P   ICD-9-CM ICD-10-CM   1. Bronchiectasis without complication ([place]) A999333 J47.9     idiopathich bronchiectasis - cause unknonw Bronchoscopy indicated Glad you are better with inhalers  Plan  - cotninue inhalers   will get a early July 2017 date for bronch and get back to you  followup  - mid July 2017 post bronch   (> 50% of this 15 min visit spent in face to face  counseling or/and coordination of care)   Dr. Brand Males, M.D., [facility].C.P Pulmonary and Critical Care Medicine Staff Physician [facility] Pager: [phone], If no answer or between  15:00h - 7:00h: call [phone]  04/23/2016 6:29 PM

## 2016-05-14 ENCOUNTER — Telehealth: Payer: Self-pay | Admitting: Internal Medicine

## 2016-05-14 NOTE — Telephone Encounter (Signed)
Patient called back described chest pain as  - worst with cough - worst with deep inspiration - has productive yellow sputum - has shortness of breath - feels like a bronchiectasis flare  Plan - Prednisone 30mg  daily x 5 days - zpak - [place] will call patient to acute care visit. She is going on vacation on Friday.  Called in Rx to [facility] [id number]  -VM

## 2016-05-14 NOTE — Telephone Encounter (Signed)
Called, spoke with pt.  Pt reports cough prod with green mucus and increased SOB x 2 days.  Last night, reports an aching pain in right chest around the "lung area" and back which is worse with deep inhalations.  Denies pain radiating to arms, jaw, n/v, or f/c/s.  Is using albuterol hfa with some relief. OV offered - pt declined; reports she cannot get off work for an appt.  Was seen on 04/23/16 by MR and is requesting recs.  MR, please advise.  Thank you.  Also, per 04/23/16 notes, pt to have bronch scheduled.  Pt requesting status of this.  Has a pending appt with MR on July 17.  MR, please advise.  Thank you.   [facility] [address]

## 2016-05-14 NOTE — Telephone Encounter (Signed)
Received call from answering service, patient with cough and chest pains.  Called patient back x 2, no answer.  Left VM to call us back or goto urgent care/ER if still having chest pain/discomfort.   -VM

## 2016-05-15 NOTE — Telephone Encounter (Signed)
elink MD tried to get hold of patient but could not   Recommend  take levaquin 500mg  once daily  X 6 days  Please take prednisone 40 mg x1 day, then 30 mg x1 day, then 20 mg x1 day, then 10 mg x1 day, and then 5 mg x1 day and stop   Will call her with bronch date for july

## 2016-05-15 NOTE — Telephone Encounter (Signed)
Spoke with pt. She was very hostile and said that no one tried to call her yesterday. When I advised her of MR's response she said that she spoke with the doctor on call and was already given the below medications. Pt was very rude and short. I checked several times with her to make sure that she received these medications and said said yes. Nothing further was needed.

## 2016-05-15 NOTE — Telephone Encounter (Signed)
Can call for ov to see if needs to be seen before leaving or after vacation  Please contact office for sooner follow up if symptoms do not improve or worsen or seek emergency care

## 2016-05-17 ENCOUNTER — Telehealth: Payer: Self-pay | Admitting: Internal Medicine

## 2016-05-17 NOTE — Telephone Encounter (Signed)
Spoke with the pt  She states that Friday July 7th will be fine  Called Resp at [employer] [facility]- had to [facility]

## 2016-05-17 NOTE — Telephone Encounter (Signed)
Pls call aptient and try to set bronch bal for Friday July 7th anytime in morning at [facility] long but after 8.30am. Indication bronchiectasis, no tb risk. Small scope. No fluor. Please let me know time and date

## 2016-05-20 NOTE — Telephone Encounter (Signed)
Called [name] at respiratory, Molly Ross is scheduled for Friday, July 7th, 2017 at 9:00 at [facility].   atc pt at both numbers listed to verify time, no answer, no vm.  Wcb. Forwarding to MR to make aware of time.

## 2016-05-20 NOTE — Telephone Encounter (Signed)
[name] from [place] returning our call to scheduled bronch. She can be reached at [phone]

## 2016-06-05 NOTE — Telephone Encounter (Signed)
Thgis patient Molly Ross has not called back agreeing to bronch BAL. Please confirma nd send message bck to me

## 2016-06-05 NOTE — Telephone Encounter (Signed)
atc pt, no answer, vm full.  Wcb.  

## 2016-06-07 NOTE — Telephone Encounter (Signed)
Spoke with pt, confirmed date/time.  Forwarding to MR to make aware.

## 2016-06-21 ENCOUNTER — Encounter (HOSPITAL_COMMUNITY)
Admission: RE | Disposition: A | Discharge status: Home / Self Care | Payer: Self-pay | Source: Ambulatory Visit | Attending: Internal Medicine

## 2016-06-21 ENCOUNTER — Ambulatory Visit (HOSPITAL_COMMUNITY)
Admission: RE | Admit: 2016-06-21 | Discharge: 2016-06-21 | Disposition: A | Discharge status: Home / Self Care | Payer: Medicare Other | Source: Ambulatory Visit | Attending: Internal Medicine | Admitting: Internal Medicine

## 2016-06-21 ENCOUNTER — Encounter (HOSPITAL_COMMUNITY): Payer: Self-pay | Admitting: Internal Medicine

## 2016-06-21 ENCOUNTER — Telehealth: Payer: Self-pay | Admitting: Internal Medicine

## 2016-06-21 ENCOUNTER — Inpatient Hospital Stay (HOSPITAL_COMMUNITY): Admission: RE | Admit: 2016-06-21 | Payer: Medicare Other | Source: Ambulatory Visit

## 2016-06-21 DIAGNOSIS — J471 Bronchiectasis with (acute) exacerbation: Secondary | ICD-10-CM

## 2016-06-21 DIAGNOSIS — Z7951 Long term (current) use of inhaled steroids: Secondary | ICD-10-CM | POA: Insufficient documentation

## 2016-06-21 DIAGNOSIS — J40 Bronchitis, not specified as acute or chronic: Secondary | ICD-10-CM | POA: Diagnosis not present

## 2016-06-21 DIAGNOSIS — J479 Bronchiectasis, uncomplicated: Secondary | ICD-10-CM | POA: Insufficient documentation

## 2016-06-21 DIAGNOSIS — Z79899 Other long term (current) drug therapy: Secondary | ICD-10-CM | POA: Diagnosis not present

## 2016-06-21 DIAGNOSIS — Z87891 Personal history of nicotine dependence: Secondary | ICD-10-CM | POA: Diagnosis not present

## 2016-06-21 DIAGNOSIS — Z7982 Long term (current) use of aspirin: Secondary | ICD-10-CM | POA: Diagnosis not present

## 2016-06-21 HISTORY — PX: VIDEO BRONCHOSCOPY: SHX5072

## 2016-06-21 LAB — BODY FLUID CELL COUNT WITH DIFFERENTIAL
Lymphs, Fluid: 2 %
Monocyte-Macrophage-Serous Fluid: 7 % — ABNORMAL LOW (ref 50–90)
NEUTROPHIL FLUID: 91 % — AB (ref 0–25)
WBC FLUID: 18645 uL — AB (ref 0–1000)

## 2016-06-21 LAB — PNEUMOCYSTIS JIROVECI SMEAR BY DFA: PNEUMOCYSTIS JIROVECI AG: NEGATIVE

## 2016-06-21 SURGERY — VIDEO BRONCHOSCOPY WITHOUT FLUORO
Anesthesia: Moderate Sedation | Laterality: Bilateral

## 2016-06-21 MED ORDER — BUTAMBEN-TETRACAINE-BENZOCAINE 2-2-14 % EX AERO: 1.0000 | INHALATION_SPRAY | Freq: Once | CUTANEOUS | Status: DC

## 2016-06-21 MED ORDER — FLUTICASONE FUROATE-VILANTEROL 100-25 MCG/INH IN AEPB: 1.0000 | INHALATION_SPRAY | Freq: Every day | RESPIRATORY_TRACT | Status: DC

## 2016-06-21 MED ORDER — FENTANYL CITRATE (PF) 100 MCG/2ML IJ SOLN
INTRAMUSCULAR | Status: DC | PRN
  Administered 2016-06-21 (×3): 25 ug via INTRAVENOUS

## 2016-06-21 MED ORDER — LIDOCAINE HCL 2 % EX GEL
CUTANEOUS | Status: DC | PRN
  Administered 2016-06-21: 1

## 2016-06-21 MED ORDER — FENTANYL CITRATE (PF) 100 MCG/2ML IJ SOLN
INTRAMUSCULAR | Status: AC
  Filled 2016-06-21: qty 4

## 2016-06-21 MED ORDER — MIDAZOLAM HCL 10 MG/2ML IJ SOLN
INTRAMUSCULAR | Status: DC | PRN
  Administered 2016-06-21: 2 mg via INTRAVENOUS
  Administered 2016-06-21: 1 mg via INTRAVENOUS

## 2016-06-21 MED ORDER — MIDAZOLAM HCL 5 MG/ML IJ SOLN
INTRAMUSCULAR | Status: AC
  Filled 2016-06-21: qty 2

## 2016-06-21 MED ORDER — PHENYLEPHRINE HCL 0.25 % NA SOLN: 1.0000 | Freq: Four times a day (QID) | NASAL | Status: DC | PRN

## 2016-06-21 MED ORDER — LIDOCAINE HCL 1 % IJ SOLN
INTRAMUSCULAR | Status: DC | PRN
  Administered 2016-06-21: 6 mL via RESPIRATORY_TRACT

## 2016-06-21 MED ORDER — PHENYLEPHRINE HCL 0.25 % NA SOLN
NASAL | Status: DC | PRN
  Administered 2016-06-21: 2 via NASAL

## 2016-06-21 MED ORDER — LIDOCAINE HCL 2 % EX GEL: 1.0000 "application " | Freq: Once | CUTANEOUS | Status: DC

## 2016-06-21 MED ORDER — SODIUM CHLORIDE 0.9 % IV SOLN
INTRAVENOUS | Status: DC
Start: 2016-06-21 — End: 2016-06-21
  Administered 2016-06-21: 09:00:00 via INTRAVENOUS

## 2016-06-21 NOTE — H&P (Signed)
PULMONARY / CRITICAL CARE MEDICINE   Name: Molly Ross MRN: [id number] DOB: 07-06-1941    :  06/21/2016 CONSULTATION DATE:  06/21/2016  HISTORY OF PRESENT ILLNESS:   Last seen 2 momnths ago. This is pre-bronch H&P. She has idiopathich bronchiectasis. DEspite mdi Rx has cough with grey sputum 1/4 cup daily. Since last visti 1 AE- bronchiectasis x 6 weeks ago. No fever, No cheill. No new med problems. No UC visit. Has been to [place], [place] for vacation. Feels well. NPO confirmed  PAST MEDICAL HISTORY :  She  has a past medical history of Colon polyp; Unspecified vitamin D deficiency (02/2011 and 2016); and Shingles (1/09 and 07/2010).  PAST SURGICAL HISTORY: She  has past surgical history that includes Shoulder surgery (03/2011) and Tonsillectomy (child).  No Known Allergies  No current facility-administered medications on file prior to encounter.   Current Outpatient Prescriptions on File Prior to Encounter  Medication Sig  . Albuterol Sulfate (PROAIR RESPICLICK) 123XX123 (90 Base) MCG/ACT AEPB Inhale 2 puffs into the lungs every 6 (six) hours. Prn wheezing or shortness of breath  . aspirin EC 81 MG tablet Take 81 mg by mouth daily.  Marland Kitchen atorvastatin (LIPITOR) 40 MG tablet Take 1 tablet (40 mg total) by mouth daily.  . Calcium-Magnesium-Vitamin D (CALCIUM 500 PO) Take 1 tablet by mouth daily.  . Cholecalciferol (VITAMIN D) 2000 UNITS tablet Take 2,000 Units by mouth daily.  . fish oil-omega-3 fatty acids 1000 MG capsule Take 3 g by mouth daily.   . Fluticasone Furoate-Vilanterol (BREO ELLIPTA) 100-25 MCG/INH AEPB Inhale 1 puff into the lungs daily.  . Multiple Vitamins-Minerals (MULTIVITAMIN WITH MINERALS) tablet Take 1 tablet by mouth daily.  Marland Kitchen NITROSTAT 0.4 MG SL tablet Reported on 12/25/2015  . Tiotropium Bromide Monohydrate (SPIRIVA RESPIMAT) 2.5 MCG/ACT AERS Inhale 2 puffs into the lungs 2 (two) times daily. (Patient taking differently: Inhale 2 puffs into the lungs as needed. )     FAMILY HISTORY:  Her indicated that her mother is deceased. She indicated that her father is deceased. She indicated that all of her three sisters are alive. She indicated that her son is alive.   SOCIAL HISTORY: She  reports that she quit smoking about 36 years ago. Her smoking use included Cigarettes. She has a 48 pack-year smoking history. She has never used smokeless tobacco. She reports that she drinks alcohol. She reports that she does not use illicit drugs.  VITAL SIGNS: BP 150/75 mmHg  Temp(Src) 98.3 F (36.8 C) (Oral)  Resp 18  SpO2 100%  INTAKE / OUTPUT:    PHYSICAL EXAMINATION:  BP 150/75 mmHg  Temp(Src) 98.3 F (36.8 C) (Oral)  Resp 18  SpO2 100%  General Appearance:    Alert, cooperative, no distress, appears stated age  Head:    Normocephalic, without obvious abnormality, atraumatic  Eyes:    PERRL, conjunctiva/corneas clear, EOM's intact, fundi    benign, both eyes  Ears:    Normal TM's and external ear canals, both ears  Nose:   Nares normal, septum midline, mucosa normal, no drainage    or sinus tenderness  Throat:   Lips, mucosa, and tongue normal; teeth and gums normal  Neck:   Supple, symmetrical, trachea midline, no adenopathy;    thyroid:  no enlargement/tenderness/nodules; no carotid   bruit or JVD  Back:     Symmetric, no curvature, ROM normal, no CVA tenderness  Lungs:     Clear to auscultation bilaterally, respirations unlabored  Chest Wall:    No tenderness or deformity   Heart:    Regular rate and rhythm, S1 and S2 normal, no murmur, rub   or gallop  Breast Exam:    No tenderness, masses, or nipple abnormality  Abdomen:     Soft, non-tender, bowel sounds active all four quadrants,    no masses, no organomegaly  Genitalia:  Not done  Rectal:   not done  Extremities:   Extremities normal, atraumatic, no cyanosis or edema  Pulses:   2+ and symmetric all extremities  Skin:   Skin color, texture, turgor normal, no rashes or lesions  Lymph  nodes:   Cervical, supraclavicular, and axillary nodes normal  Neurologic:   CNII-XII intact, normal strength, sensation and reflexes    throughout    LABS:  BMET No results for input(s): NA, K, CL, CO2, BUN, CREATININE, GLUCOSE in the last 168 hours.  Electrolytes No results for input(s): CALCIUM, MG, PHOS in the last 168 hours.  CBC No results for input(s): WBC, HGB, HCT, PLT in the last 168 hours.  Coag's No results for input(s): APTT, INR in the last 168 hours.  Sepsis Markers No results for input(s): LATICACIDVEN, PROCALCITON, O2SATVEN in the last 168 hours.  ABG No results for input(s): PHART, PCO2ART, PO2ART in the last 168 hours.  Liver Enzymes No results for input(s): AST, ALT, ALKPHOS, BILITOT, ALBUMIN in the last 168 hours.  Cardiac Enzymes No results for input(s): TROPONINI, PROBNP in the last 168 hours.  Glucose No results for input(s): GLUCAP in the last 168 hours.  Imaging No results found.    ASSESSMENT / PLAN:  PULMONARY A: idiopathich bronchiectasis - symptomatic. RML near collapse  P:   Bronch BAL Risks of pneumothorax, hemothorax, sedation/anesthesia complications such as cardiac or respiratory arrest or hypotension, stroke and bleeding all explained. Benefits of diagnosis but limitations of non-diagnosis also explained. Patient verbalized understanding and wished to proceed.     Dr. Brand Males, M.D., [facility].C.P Pulmonary and Critical Care Medicine Staff Physician [place] [facility] Pager: [phone], If no answer or between  15:00h - 7:00h: call [phone]  06/21/2016 9:10 AM

## 2016-06-21 NOTE — Telephone Encounter (Signed)
Samples left at front desk for patient's sister to pick up. Nothing further needed.

## 2016-06-21 NOTE — Telephone Encounter (Signed)
pls get samples (atleast 2) of breo 100 for her at front desk - patient/sister will pick it up from frent desk > 11am 06/21/2016

## 2016-06-21 NOTE — Progress Notes (Signed)
Video bronchoscopy performed Intervention bronchial washings Pt tolerated well  Braylee Bosher David RRT  

## 2016-06-21 NOTE — Discharge Instructions (Signed)
Flexible Bronchoscopy, Care After These instructions give you information on caring for yourself after your procedure. Your doctor may also give you more specific instructions. Call your doctor if you have any problems or questions after your procedure. HOME CARE  Do not eat or drink anything for 2 hours after your procedure. If you try to eat or drink before the medicine wears off, food or drink could go into your lungs. You could also burn yourself.  After 2 hours have passed and when you can cough and gag normally, you may eat soft food and drink liquids slowly.  The day after the test, you may eat your normal diet.  You may do your normal activities.  Keep all doctor visits. GET HELP RIGHT AWAY IF:  You get more and more short of breath.  You get light-headed.  You feel like you are going to pass out (faint).  You have chest pain.  You have new problems that worry you.  You cough up more than a little blood.  You cough up more blood than before. MAKE SURE YOU:  Understand these instructions.  Will watch your condition.  Will get help right away if you are not doing well or get worse.   This information is not intended to replace advice given to you by your health care provider. Make sure you discuss any questions you have with your health care provider.   Document Released: 09/29/2009 Document Revised: 12/07/2013 Document Reviewed: 08/06/2013 Elsevier Interactive Patient Education 2016 [place] not eat or drink until after 1130 today 06/21/16   Future Appointments Date Time Provider [place]  06/24/2016 10:45 AM [facility] NURSE [facility]  07/01/2016 10:45 AM Brand Males, MD [facility] None  12/30/2016 8:45 AM Rita Ohara, MD [facility]

## 2016-06-21 NOTE — Op Note (Signed)
Name:  Molly Ross MRN:  [id number] DOB:  1941/01/10  PROCEDURE NOTE  Procedure(s): Flexible bronchoscopy [phone]) Bronchial alveolar lavage [phone]) of the Right Middle Lobe   Indications: Idiopathich Bronchiectasis  Consent:  Procedure, benefits, risks and alternatives discussed.  Questions answered.  Consent obtained.  Anesthesia:   Moderate Sedation  Procedure summary:  Appropriate equipment was assembled.  The patient was brought to the operating room and identified as Molly Ross.  Safety timeout was performed. The patient was placed supine on the operating table, airway established/ensured and  Moderate sedation administered byDr Chase Caller and RT team  After the appropriate level of anesthesia was assured, flexible video bronchoscope was lubricated and inserted through the endotracheal tube.  Total of 30 mL of 1% Lidocaine were administered through the bronchoscope to augment sedation  The vocal cords looked edematous. Airway examination was performed bilaterally to subsegmental level.  Significant secretions that were white thick  secretions were noted all over esp on right side and had to be suctioned out. Overall airways looked chronically edematous but no endobronchial lesions were identified.  The right middle lobe in particular was more ededmatous. Overall right more edematous than left  Bronchial alveolar lavage of the right middle lobe was performed with 90 mL of normal saline and return of 20-30 mL of fluid (cloudy ponk) after which the bronchoscope was withdrawn.     After hemostasis was assure, the bronchoscope was withdrawn.  The patient will be transferred to recovery  Post-procedure chest x-ray was not ordered.  Specimens sent: Bronchial alveolar lavage specimen of the right middle lobe for cell countmicrobiology and cytology.  Complications:  No immediate complications were noted.  Hemodynamic parameters and oxygenation remained stable throughout the  procedure.  Estimated blood loss:  None  Overalll findings 1. Edematous airway R > L including vocal cords  2. Copious purulent secretions 3. RML Lavage   Will need fu in several weeks to discuss results   Dr. Brand Males, M.D., [facility].C.P Pulmonary and Critical Care Medicine Staff Physician [place] [facility] Pager: [phone], If no answer or between  15:00h - 7:00h: call [phone]  06/21/2016 9:28 AM

## 2016-06-22 LAB — ACID FAST SMEAR (AFB, MYCOBACTERIA): Acid Fast Smear: NEGATIVE

## 2016-06-22 LAB — ACID FAST SMEAR (AFB)

## 2016-06-23 LAB — CULTURE, BAL-QUANTITATIVE: CULTURE: NORMAL — AB

## 2016-06-23 LAB — CULTURE, BAL-QUANTITATIVE W GRAM STAIN: Special Requests: NORMAL

## 2016-06-24 ENCOUNTER — Other Ambulatory Visit: Payer: Medicare Other

## 2016-06-24 DIAGNOSIS — Z79899 Other long term (current) drug therapy: Secondary | ICD-10-CM | POA: Diagnosis not present

## 2016-06-24 LAB — HEPATIC FUNCTION PANEL
ALK PHOS: 68 U/L (ref 33–130)
ALT: 8 U/L (ref 6–29)
AST: 14 U/L (ref 10–35)
Albumin: 4 g/dL (ref 3.6–5.1)
BILIRUBIN DIRECT: 0.2 mg/dL (ref <=0.2)
BILIRUBIN INDIRECT: 0.6 mg/dL (ref 0.2–1.2)
BILIRUBIN TOTAL: 0.8 mg/dL (ref 0.2–1.2)
Total Protein: 6.6 g/dL (ref 6.1–8.1)

## 2016-06-25 LAB — MTB NAA WITHOUT AFB CULTURE: M Tuberculosis, Naa: NEGATIVE

## 2016-07-01 ENCOUNTER — Ambulatory Visit (INDEPENDENT_AMBULATORY_CARE_PROVIDER_SITE_OTHER): Payer: Medicare Other | Admitting: Internal Medicine

## 2016-07-01 ENCOUNTER — Encounter: Payer: Self-pay | Admitting: Internal Medicine

## 2016-07-01 VITALS — BP 126/68 | HR 81 | Ht 69.0 in | Wt 142.4 lb

## 2016-07-01 DIAGNOSIS — J479 Bronchiectasis, uncomplicated: Secondary | ICD-10-CM

## 2016-07-01 DIAGNOSIS — J471 Bronchiectasis with (acute) exacerbation: Secondary | ICD-10-CM | POA: Diagnosis not present

## 2016-07-01 MED ORDER — SODIUM CHLORIDE 3 % IN NEBU: INHALATION_SOLUTION | Freq: Two times a day (BID) | RESPIRATORY_TRACT | Status: DC

## 2016-07-01 MED ORDER — DOXYCYCLINE HYCLATE 100 MG PO TABS: 100.0000 mg | ORAL_TABLET | Freq: Two times a day (BID) | ORAL | Status: DC

## 2016-07-01 MED ORDER — PREDNISONE 10 MG PO TABS: ORAL_TABLET | ORAL | Status: DC

## 2016-07-01 MED ORDER — FLUTTER DEVI: Status: DC

## 2016-07-01 NOTE — Progress Notes (Signed)
Subjective:     Patient ID: Molly Ross, female   DOB: 08/08/1941, 75 y.o.   MRN: [id number]  HPI    IOV 06/09/2015  Chief Complaint  Patient presents with  . Pulmonary Consult    Pt referred by Dr. Tomi Bamberger for abnormal CT.    75 year old female somewhat of a poor historian. History is gained from repeatedly asking the same questions in many different ways and also review of Dr Rita Ohara notes in the EMR   She reports that she is a 40 pack smoker having quit 34 years ago. In the 1970s and 1980s she had recurrent bouts of pneumonia which left her with bronchiectasis. At baseline she's had some chronic cough of unclear severity and shortness of breath with exertion of unclear severity but probably mild. Unclear if she has associated sputum. Unclear what aggravating and relieving factors where or are. Then in for a 2016 she developed over several weeks worsening cough with green sputum and some fever. Apparently was diagnosed with pneumonia. This then resulted in a CT scan of the chest and her 2016 which I personally reviewed and shows bilateral bronchiectasis particularly on the right middle lobe. She also had a nodule. Antibody treatment she improved. Then in April 2016 was started on Brio which inhaler therapy help her cough and shortness of breath significantly. Currently these are not much much of an issue for her. She has a good quality of life with the Brio. The Brio is expensive. She had follow-up CT scan of the chest 04/26/2015 that shows resolution of the nodule but persistence of the bronchiectasis. In fact she seems to have chronic bronchiectasis dating back on several chest x-rays although it back to 2009. She had test that she's had bronchiectasis since the 1980s. Currently no sputum production not much of a cough or shortness of breath. She denies having had bronchoscopy for the same.  She is not interested in much testing  Noted spirometry pre-breo March 2016  - fev 1.14L/45%, R 56 -  severe obstructive lung disease And post Breo in June 05 2015  - Fev 1.32L/52%, R 57 -> significantly improved but still in moderate category   OV 11/06/2015  Chief Complaint  Patient presents with  . Follow-up    Pt here after PFT. Pt states she respiratory infection in October and was on 2 rounds of abx and is now back to her baseline. Pt c/o mild non prod cough. Pt denies CP/tightness.    Previous heavy smoker with right middle lobe chronic bronchiectasis with findings suggestive of MAI and moderate/severe obstructive lung disease with findings of emphysema on CT chest - February and May 2016  - Last seen in June 2016. At that time he started Brio. Overall she has been doing well. However in October 2016 she had a respiratory exacerbation. She was treated with 2 rounds of anabiotic's and prednisone course and then she fell baseline. She is followed up with a pulmonary function test  t 10/30/2015) baseline and well. 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 Currently she is feeling well. Review of vaccine shows she is up-to-date with flu shot. She thinks she has had the pneumonia vaccine with her primary care physician but she's not sure and she is going to check with her.  No new issues.  OV 02/12/2016  Chief Complaint  Patient presents with  . Follow-up  Pt reports having some increased breathing issues since last OV- treated a few times with abx/pred for infections. Most recent x 1 week ago- Zpak w/Pred taper. Pt notes some green mucus but cough is much improved.   Heavy smoker with right middle lobe chronic bronchiectasis and some lingular bronchiectasis and scattered infiltrates in the lower lobe based on CT February and May 2016. May 2016 with improvement   - This is a routine follow-up. Last seen November 2016. She is compliant with her inhalers. In the interim approximately on  Valentine's Day 2017 had another flareup and he called in Z-Pak and prednisone. This has helped. Cough is significantly worse but it has improved although not at baseline. She slowly getting better. This will be her third exacerbation. Review of the chart shows an visualization of the CT chest which I did myself and showed her shows findings described above. Chart review also shows that do not had a complete bronchiectasis hematological and etiological workup. She is open to having bronchoscopy   OV 04/23/2016  Chief Complaint  Patient presents with  . Follow-up    Pt here after CT chest. Pt states her breathing has improved since last OV. Pt states she is still coughing in the morning but now there is less mucus production.      Heavy smoker with right middle lobe chronic bronchiectasis and some lingular bronchiectasis and scattered infiltrates in the lower lobe based on CT February and May 2016. PFT Nov 2016 -> fev 1.36L/51%, R 60. DLCO 16/51%   In  Nov 2016 I added spiriva and now on triple inhaler Rx- says with this symptoms she is significantly better. Still with cough. Some spuitum +. Labs for etiology reviewed done feb 2017 -> IgG, IgA, IgM - normal. Allpha 1AT - normal. Autoimmune - ANA, CCP, dsDNA, RF, ENA, SSA, SSB, scl-70, RNP - negative.    OV 07/01/2016  Chief Complaint  Patient presents with  . Follow-up    Follow up bronchoscopy.  pt c/o R sided back pain X3 days, nonprod cough.      Idiopathic bronchiectasis. Here to review results. Underwent bronchoscopy with lavage 06/21/2016 that showed acute neutrophils 91%. Culture was negative. AFB smear is negative fungal smear was negative. AFB culture is pending. Bronchoscopy showed a lot of mucus. She says that when she lies on she has significant cough and mucus. She feels that after bronchoscopy she improved because of the removal of mucus but shortly after this for the last few days she's having right infrascapular pleuritic and  off chest tightness that is consistent with previous exacerbation and she feels she needs an antibiotic and prednisone course. She is compliant with Spiriva and Brio at this point. Overall even at baseline she admits to copious white mucus.     has a past medical history of Colon polyp; Unspecified vitamin D deficiency (02/2011 and 2016); and Shingles (1/09 and 07/2010).   reports that she quit smoking about 36 years ago. Her smoking use included Cigarettes. She has a 48 pack-year smoking history. She has never used smokeless tobacco.  Past Surgical History  Procedure Laterality Date  . Shoulder surgery  03/2011    for frozen shoulder, and cyst removal (Dr. Percell Miller)  . Tonsillectomy  child  . Video bronchoscopy Bilateral 06/21/2016    Procedure: VIDEO BRONCHOSCOPY WITHOUT FLUORO;  Surgeon: Brand Males, MD;  Location: [facility];  Service: Cardiopulmonary;  Laterality: Bilateral;    No Known Allergies  Immunization History  Administered Date(s)  Administered  . Influenza, High Dose Seasonal PF 01/26/2014, 10/19/2014, 08/30/2015  . Pneumococcal Conjugate-13 12/25/2015    Family History  Problem Relation Age of Onset  . Stroke Mother   . Diabetes Mother   . Stroke Sister 35    brainstem stroke  . Diabetes Sister   . Ovarian cancer Sister 18    metastatic to lung, liver  . Cancer Sister      Current outpatient prescriptions:  .  Albuterol Sulfate (PROAIR RESPICLICK) 123XX123 (90 Base) MCG/ACT AEPB, Inhale 2 puffs into the lungs every 6 (six) hours. Prn wheezing or shortness of breath, Disp: 1 each, Rfl: 0 .  aspirin EC 81 MG tablet, Take 81 mg by mouth daily., Disp: , Rfl:  .  atorvastatin (LIPITOR) 40 MG tablet, Take 1 tablet (40 mg total) by mouth daily., Disp: 90 tablet, Rfl: 1 .  Calcium-Magnesium-Vitamin D (CALCIUM 500 PO), Take 1 tablet by mouth daily., Disp: , Rfl:  .  Cholecalciferol (VITAMIN D) 2000 UNITS tablet, Take 2,000 Units by mouth daily., Disp: , Rfl:  .  fish  oil-omega-3 fatty acids 1000 MG capsule, Take 3 g by mouth daily. , Disp: , Rfl:  .  Fluticasone Furoate-Vilanterol (BREO ELLIPTA) 100-25 MCG/INH AEPB, Inhale 1 puff into the lungs daily., Disp: 60 each, Rfl: 0 .  Multiple Vitamins-Minerals (MULTIVITAMIN WITH MINERALS) tablet, Take 1 tablet by mouth daily., Disp: , Rfl:  .  NITROSTAT 0.4 MG SL tablet, Reported on 12/25/2015, Disp: , Rfl: 12 .  Tiotropium Bromide Monohydrate (SPIRIVA RESPIMAT) 2.5 MCG/ACT AERS, Inhale 2 puffs into the lungs 2 (two) times daily. (Patient taking differently: Inhale 1 puff into the lungs 2 (two) times daily. ), Disp: 1 Inhaler, Rfl: 5     Review of Systems     Objective:   Physical Exam  Filed Vitals:   07/01/16 1120  BP: 126/68  Pulse: 81  Height: 5\' 9"  (1.753 m)  Weight: 142 lb 6.4 oz (64.592 kg)  SpO2: 97%  Mainly a discussion visit. Focused exam on the lungs show right lower lobe crackles    Assessment:       ICD-9-CM ICD-10-CM   1. Bronchiectasis without complication (HCC) A999333 J47.9   2. Bronchiectasis with acute exacerbation (HCC) 494.1 J47.1   Idiopathic bronchiectasis. Culture negative. If the culture pending.     Plan:      Results from [place] 06/21/16 show lot of acute inflmmation and mucus  Plan  - Stop fish oil-there is a theory that this can make acid reflux worse and get into the lungs - Continue Brio and Spiriva as before scheduled inhalers -Use albuterol inhaler as needed - Start 3% nebulizer solution [[facility]] 3 ML twice daily - Start Acapella device and flutter valve daily at least twice - Please take prednisone 40 mg x1 day, then 30 mg x1 day, then 20 mg x1 day, then 10 mg x1 day, and then 5 mg x1 day and stop - Take doxycycline 100mg  po twice daily x 5 days; take after meals and avoid sunlight   follow-up - 2 months or sooner if needed ; if mucus volume does not improve with the above measures we will have to  start vibratory vest  (> 50% of this 15 min  visit spent in face to face counseling or/and coordination of care)   Dr. Brand Males, M.D., [facility].C.P Pulmonary and Critical Care Medicine Staff Physician [place] [facility] Pager: [phone],  If no answer or between  15:00h - 7:00h: call [phone]  07/01/2016 11:46 AM

## 2016-07-01 NOTE — Patient Instructions (Addendum)
ICD-9-CM ICD-10-CM   1. Bronchiectasis without complication (HCC) A999333 J47.9   2. Bronchiectasis with acute exacerbation ([place]) 494.1 J47.1    Results from [place] 06/21/16 show lot of acute inflmmation and mucus  Plan  - Stop fish oil-there is a theory that this can make acid reflux worse and get into the lungs - Continue Brio and Spiriva as before scheduled inhalers -Use albuterol inhaler as needed - Start 3% nebulizer solution [[facility]] 3 ML twice daily - Start Acapella device and flutter valve daily at least twice - Please take prednisone 40 mg x1 day, then 30 mg x1 day, then 20 mg x1 day, then 10 mg x1 day, and then 5 mg x1 day and stop - Take doxycycline 100mg  po twice daily x 5 days; take after meals and avoid sunlight   follow-up - 2 months or sooner if needed ; if mucus volume does not improve with the above measures we will have to  start vibratory vest

## 2016-07-23 LAB — FUNGUS CULTURE WITH STAIN

## 2016-07-23 LAB — FUNGAL ORGANISM REFLEX

## 2016-07-23 LAB — FUNGUS CULTURE RESULT

## 2016-08-04 LAB — ACID FAST CULTURE WITH REFLEXED SENSITIVITIES (MYCOBACTERIA): Acid Fast Culture: NEGATIVE

## 2016-08-08 ENCOUNTER — Other Ambulatory Visit (INDEPENDENT_AMBULATORY_CARE_PROVIDER_SITE_OTHER): Payer: Medicare Other

## 2016-08-08 DIAGNOSIS — Z23 Encounter for immunization: Secondary | ICD-10-CM | POA: Diagnosis not present

## 2016-09-02 ENCOUNTER — Ambulatory Visit: Payer: Medicare Other | Admitting: Internal Medicine

## 2016-09-04 ENCOUNTER — Telehealth: Payer: Self-pay | Admitting: Internal Medicine

## 2016-09-04 MED ORDER — AZITHROMYCIN 250 MG PO TABS
ORAL_TABLET | ORAL | 0 refills | Status: AC
Start: 2016-09-04 — End: 2016-09-09

## 2016-09-04 MED ORDER — PREDNISONE 10 MG PO TABS
ORAL_TABLET | ORAL | 0 refills | Status: DC
Start: 2016-09-04 — End: 2016-09-16

## 2016-09-04 NOTE — Telephone Encounter (Signed)
Try  Z pak and Please take prednisone 40 mg x1 day, then 30 mg x1 day, then 20 mg x1 day, then 10 mg x1 day, and then 5 mg x1 day and stop

## 2016-09-04 NOTE — Telephone Encounter (Signed)
Pt having right lung pain/discomfort x 1 day. Pt also having some coughing with Molly Ross mucus production.  Pt requesting something be called into her pharmacy.  Please advise Dr Chase Caller. Thanks.

## 2016-09-04 NOTE — Telephone Encounter (Signed)
Spoke with pt. She is aware of MR's recommendation. Rxs have been sent in. Nothing further was needed.

## 2016-09-16 ENCOUNTER — Ambulatory Visit (INDEPENDENT_AMBULATORY_CARE_PROVIDER_SITE_OTHER): Payer: Medicare Other | Admitting: Internal Medicine

## 2016-09-16 ENCOUNTER — Encounter: Payer: Self-pay | Admitting: Internal Medicine

## 2016-09-16 VITALS — BP 118/64 | HR 87 | Ht 69.0 in | Wt 142.0 lb

## 2016-09-16 DIAGNOSIS — J479 Bronchiectasis, uncomplicated: Secondary | ICD-10-CM | POA: Diagnosis not present

## 2016-09-16 NOTE — Patient Instructions (Signed)
ICD-9-CM ICD-10-CM   1. Bronchiectasis without complication (HCC) A999333 J47.9     Stable disease  Plan - glad uptodate with flu shot  - continue  spiriva and breo daily with albuterol as needed = take mucinex DM 1-2 tab per day as needed  - do positional chest PT daily  - continue flutter valve - call for flareups  Followp 6 months or sooner if needed

## 2016-09-16 NOTE — Progress Notes (Signed)
Subjective:     Patient ID: Molly Ross, female   DOB: September 02, 1941, 75 y.o.   MRN: [id number]  HPI   IOV 06/09/2015  Chief Complaint  Patient presents with  . Pulmonary Consult    Pt referred by Dr. Tomi Bamberger for abnormal CT.    75 year old female somewhat of a poor historian. History is gained from repeatedly asking the same questions in many different ways and also review of Dr Rita Ohara notes in the EMR   She reports that she is a 40 pack smoker having quit 34 years ago. In the 1970s and 1980s she had recurrent bouts of pneumonia which left her with bronchiectasis. At baseline she's had some chronic cough of unclear severity and shortness of breath with exertion of unclear severity but probably mild. Unclear if she has associated sputum. Unclear what aggravating and relieving factors where or are. Then in for a 2016 she developed over several weeks worsening cough with green sputum and some fever. Apparently was diagnosed with pneumonia. This then resulted in a CT scan of the chest and her 2016 which I personally reviewed and shows bilateral bronchiectasis particularly on the right middle lobe. She also had a nodule. Antibody treatment she improved. Then in April 2016 was started on Brio which inhaler therapy help her cough and shortness of breath significantly. Currently these are not much much of an issue for her. She has a good quality of life with the Brio. The Brio is expensive. She had follow-up CT scan of the chest 04/26/2015 that shows resolution of the nodule but persistence of the bronchiectasis. In fact she seems to have chronic bronchiectasis dating back on several chest x-rays although it back to 2009. She had test that she's had bronchiectasis since the 1980s. Currently no sputum production not much of a cough or shortness of breath. She denies having had bronchoscopy for the same.  She is not interested in much testing  Noted spirometry pre-breo March 2016  - fev 1.14L/45%, R 56 -  severe obstructive lung disease And post Breo in June 05 2015  - Fev 1.32L/52%, R 57 -> significantly improved but still in moderate category   OV 11/06/2015  Chief Complaint  Patient presents with  . Follow-up    Pt here after PFT. Pt states she respiratory infection in October and was on 2 rounds of abx and is now back to her baseline. Pt c/o mild non prod cough. Pt denies CP/tightness.    Previous heavy smoker with right middle lobe chronic bronchiectasis with findings suggestive of MAI and moderate/severe obstructive lung disease with findings of emphysema on CT chest - February and May 2016  - Last seen in June 2016. At that time he started Brio. Overall she has been doing well. However in October 2016 she had a respiratory exacerbation. She was treated with 2 rounds of anabiotic's and prednisone course and then she fell baseline. She is followed up with a pulmonary function test  t 10/30/2015) baseline and well. 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 Currently she is feeling well. Review of vaccine shows she is up-to-date with flu shot. She thinks she has had the pneumonia vaccine with her primary care physician but she's not sure and she is going to check with her.  No new issues.  OV 02/12/2016  Chief Complaint  Patient presents with  . Follow-up  Pt reports having some increased breathing issues since last OV- treated a few times with abx/pred for infections. Most recent x 1 week ago- Zpak w/Pred taper. Pt notes some green mucus but cough is much improved.   Heavy smoker with right middle lobe chronic bronchiectasis and some lingular bronchiectasis and scattered infiltrates in the lower lobe based on CT February and May 2016. May 2016 with improvement   - This is a routine follow-up. Last seen November 2016. She is compliant with her inhalers. In the interim approximately on  Valentine's Day 2017 had another flareup and he called in Z-Pak and prednisone. This has helped. Cough is significantly worse but it has improved although not at baseline. She slowly getting better. This will be her third exacerbation. Review of the chart shows an visualization of the CT chest which I did myself and showed her shows findings described above. Chart review also shows that do not had a complete bronchiectasis hematological and etiological workup. She is open to having bronchoscopy   OV 04/23/2016  Chief Complaint  Patient presents with  . Follow-up    Pt here after CT chest. Pt states her breathing has improved since last OV. Pt states she is still coughing in the morning but now there is less mucus production.      Heavy smoker with right middle lobe chronic bronchiectasis and some lingular bronchiectasis and scattered infiltrates in the lower lobe based on CT February and May 2016. PFT Nov 2016 -> fev 1.36L/51%, R 60. DLCO 16/51%   In  Nov 2016 I added spiriva and now on triple inhaler Rx- says with this symptoms she is significantly better. Still with cough. Some spuitum +. Labs for etiology reviewed done feb 2017 -> IgG, IgA, IgM - normal. Allpha 1AT - normal. Autoimmune - ANA, CCP, dsDNA, RF, ENA, SSA, SSB, scl-70, RNP - negative.    OV 07/01/2016  Chief Complaint  Patient presents with  . Follow-up    Follow up bronchoscopy.  pt c/o R sided back pain X3 days, nonprod cough.      Idiopathic bronchiectasis. Here to review results. Underwent bronchoscopy with lavage 06/21/2016 that showed acute neutrophils 91%. Culture was negative. AFB smear is negative fungal smear was negative. AFB culture is pending. Bronchoscopy showed a lot of mucus. She says that when she lies on she has significant cough and mucus. She feels that after bronchoscopy she improved because of the removal of mucus but shortly after this for the last few days she's having right infrascapular pleuritic and  off chest tightness that is consistent with previous exacerbation and she feels she needs an antibiotic and prednisone course. She is compliant with Spiriva and Brio at this point. Overall even at baseline she admits to copious white mucus.   OV 09/16/2016  Chief Complaint  Patient presents with  . Follow-up    Pt states that she does have some lung congestion, little cough and wheezing with SOB. Pt states that she is trying to learn when to "rest" and not overdo herself. Needs refills Breo and Spiriva Resp.    Follow-up idiopathic bronchiectasis. Last visit mid July 2017. After that called in 09/04/2016 with symptoms of bronchiectasis exacerbation. Called in Z-Pak and prednisone. She is back to baseline. At this point in time she tells me that she does not have daily mucus other than mild amounts. She does admit that she is noncompliant with physician with chest physical therapy. She recollects that one time she position herself  in an inversion table for back therapy and this cleared of the mucus and she felt much better. She admits that she needs to do a better job with positional therapy. She is up-to-date with flu shot. She continues on Spiriva and Brio triple inhaler therapy. There are no new issues.    has a past medical history of Colon polyp; Shingles (1/09 and 07/2010); and Unspecified vitamin D deficiency (02/2011 and 2016).   reports that she quit smoking about 36 years ago. Her smoking use included Cigarettes. She has a 48.00 pack-year smoking history. She has never used smokeless tobacco.  Past Surgical History:  Procedure Laterality Date  . SHOULDER SURGERY  03/2011   for frozen shoulder, and cyst removal (Dr. Percell Miller)  . TONSILLECTOMY  child  . VIDEO BRONCHOSCOPY Bilateral 06/21/2016   Procedure: VIDEO BRONCHOSCOPY WITHOUT FLUORO;  Surgeon: Brand Males, MD;  Location: [facility];  Service: Cardiopulmonary;  Laterality: Bilateral;    No Known Allergies  Immunization History   Administered Date(s) Administered  . Influenza, High Dose Seasonal PF 01/26/2014, 10/19/2014, 08/30/2015, 08/08/2016  . Pneumococcal Conjugate-13 12/25/2015    Family History  Problem Relation Age of Onset  . Stroke Mother   . Diabetes Mother   . Stroke Sister 61    brainstem stroke  . Diabetes Sister   . Ovarian cancer Sister 93    metastatic to lung, liver  . Cancer Sister      Current Outpatient Prescriptions:  .  Albuterol Sulfate (PROAIR RESPICLICK) [id number] (90 Base) MCG/ACT AEPB, Inhale 2 puffs into the lungs every 6 (six) hours. Prn wheezing or shortness of breath, Disp: 1 each, Rfl: 0 .  aspirin EC 81 MG tablet, Take 81 mg by mouth daily., Disp: , Rfl:  .  atorvastatin (LIPITOR) 40 MG tablet, Take 1 tablet (40 mg total) by mouth daily., Disp: 90 tablet, Rfl: 1 .  Calcium-Magnesium-Vitamin D (CALCIUM 500 PO), Take 1 tablet by mouth daily., Disp: , Rfl:  .  Cholecalciferol (VITAMIN D) 2000 UNITS tablet, Take 2,000 Units by mouth daily., Disp: , Rfl:  .  fish oil-omega-3 fatty acids 1000 MG capsule, Take 3 g by mouth daily. , Disp: , Rfl:  .  Fluticasone Furoate-Vilanterol (BREO ELLIPTA) 100-25 MCG/INH AEPB, Inhale 1 puff into the lungs daily., Disp: 60 each, Rfl: 0 .  Multiple Vitamins-Minerals (MULTIVITAMIN WITH MINERALS) tablet, Take 1 tablet by mouth daily., Disp: , Rfl:  .  NITROSTAT 0.4 MG SL tablet, Reported on 12/25/2015, Disp: , Rfl: 12 .  Respiratory Therapy Supplies (FLUTTER) DEVI, Use as directed, Disp: 1 each, Rfl: 0 .  sodium chloride HYPERTONIC 3 % nebulizer solution, Take by nebulization 2 (two) times daily., Disp: 750 mL, Rfl: 11 .  Tiotropium Bromide Monohydrate (SPIRIVA RESPIMAT) 2.5 MCG/ACT AERS, Inhale 2 puffs into the lungs 2 (two) times daily. (Patient taking differently: Inhale 1 puff into the lungs 2 (two) times daily. ), Disp: 1 Inhaler, Rfl: 5    Review of Systems     Objective:   Physical Exam  Constitutional: She is oriented to person, place, and  time. She appears well-developed and well-nourished. No distress.  HENT:  Head: Normocephalic and atraumatic.  Right Ear: External ear normal.  Left Ear: External ear normal.  Mouth/Throat: Oropharynx is clear and moist. No oropharyngeal exudate.  Eyes: Conjunctivae and EOM are normal. Pupils are equal, round, and reactive to light. Right eye exhibits no discharge. Left eye exhibits no discharge. No scleral icterus.  Neck: Normal range of  motion. Neck supple. No JVD present. No tracheal deviation present. No thyromegaly present.  Cardiovascular: Normal rate, regular rhythm, normal heart sounds and intact distal pulses.  Exam reveals no gallop and no friction rub.   No murmur heard. Pulmonary/Chest: Effort normal and breath sounds normal. No respiratory distress. She has no wheezes. She has no rales. She exhibits no tenderness.  Abdominal: Soft. Bowel sounds are normal. She exhibits no distension and no mass. There is no tenderness. There is no rebound and no guarding.  Musculoskeletal: Normal range of motion. She exhibits no edema or tenderness.  Lymphadenopathy:    She has no cervical adenopathy.  Neurological: She is alert and oriented to person, place, and time. She has normal reflexes. No cranial nerve deficit. She exhibits normal muscle tone. Coordination normal.  Skin: Skin is warm and dry. No rash noted. She is not diaphoretic. No erythema. No pallor.  Psychiatric: She has a normal mood and affect. Her behavior is normal. Judgment and thought content normal.  Vitals reviewed.  Vitals:   09/16/16 1452  BP: 118/64  Pulse: 87  SpO2: 96%  Weight: 142 lb (64.4 kg)  Height: 5\' 9"  (1.753 m)        Assessment:       ICD-9-CM ICD-10-CM   1. Bronchiectasis without complication (HCC) A999333 J47.9         Plan:      Stable disease  Plan - glad uptodate with flu shot  - continue  spiriva and breo daily with albuterol as needed = take mucinex DM 1-2 tab per day as needed  - do  positional chest PT daily  - continue flutter valve - call for flareups  Followp 6 months or sooner if neede

## 2016-09-18 ENCOUNTER — Telehealth: Payer: Self-pay | Admitting: Internal Medicine

## 2016-09-18 MED ORDER — DOXYCYCLINE HYCLATE 100 MG PO TABS: 100.0000 mg | ORAL_TABLET | Freq: Two times a day (BID) | ORAL | 0 refills | Status: DC

## 2016-09-18 NOTE — Telephone Encounter (Signed)
Called and spoke with pt and she stated that she spent about 12 hours with her sister at the ER before she got admitted.  She stated that she is having cough still with yellow/green sputum, with her back hurting.  She stated that she feels she needs to have another round of the abx that she finished about 2 weeks ago.  MR please advise. thanks

## 2016-09-18 NOTE — Telephone Encounter (Signed)
Patient returning call - she can be reached at [phone]

## 2016-09-18 NOTE — Telephone Encounter (Signed)
Pt aware that Doxy 100mg  has been [facility]. Nothing further needed.

## 2016-09-18 NOTE — Telephone Encounter (Signed)
lmtcb x1 for pt. 

## 2016-09-18 NOTE — Telephone Encounter (Signed)
Try Take doxycycline 100mg  po twice daily x 5 days; take after meals and avoid sunlight   Dr. Brand Males, M.D., [facility].C.P Pulmonary and Critical Care Medicine Staff Physician [facility] Pager: [phone], If no answer or between  15:00h - 7:00h: call [phone]  09/18/2016 5:08 PM

## 2016-09-23 ENCOUNTER — Other Ambulatory Visit: Payer: Self-pay | Admitting: Family Medicine

## 2016-09-23 DIAGNOSIS — F419 Anxiety disorder, unspecified: Secondary | ICD-10-CM

## 2016-09-23 MED ORDER — ALPRAZOLAM 0.25 MG PO TABS: 0.2500 mg | ORAL_TABLET | Freq: Three times a day (TID) | ORAL | 0 refills | Status: DC | PRN

## 2016-09-23 NOTE — Telephone Encounter (Signed)
She is caring for her sister who had a seizure, multiple falls, can't be left alone.  Feeling anxious, worried. Asking for a nerve medication.  Recalls taking alprazolam years ago during divorce.  Would like to have some on hand.  Discussed risks/side effects. Use sparingly for extreme anxiety

## 2016-11-05 ENCOUNTER — Other Ambulatory Visit: Payer: Self-pay | Admitting: Family Medicine

## 2016-11-05 DIAGNOSIS — F419 Anxiety disorder, unspecified: Secondary | ICD-10-CM

## 2016-11-06 NOTE — Telephone Encounter (Signed)
Last filled 10/9 for #20.  Okay to refill (no add'l refills)

## 2016-11-06 NOTE — Telephone Encounter (Signed)
Is this okay to refill? 

## 2016-11-18 ENCOUNTER — Telehealth: Payer: Self-pay | Admitting: Internal Medicine

## 2016-11-18 MED ORDER — AZITHROMYCIN 250 MG PO TABS: ORAL_TABLET | ORAL | 0 refills | Status: DC

## 2016-11-18 MED ORDER — PREDNISONE 10 MG PO TABS: ORAL_TABLET | ORAL | 0 refills | Status: DC

## 2016-11-18 NOTE — Telephone Encounter (Signed)
Patient stated request for the nurse to call her at work. She will be there until 7:30/8:00 pm. Work number [phone] Ext:2

## 2016-11-18 NOTE — Telephone Encounter (Signed)
Please send script for zpak.  Please send script for prednisone 10 mg >> 3 pills daily for 2 days, 2 pills daily for 2 days, 1 pill daily for 2 days.  #12 with no refills.

## 2016-11-18 NOTE — Telephone Encounter (Signed)
Spoke with pt, aware of recs.  rx's sent to preferred pharmacy.  Nothing further needed.  

## 2016-11-18 NOTE — Telephone Encounter (Signed)
Called and spoke to pt. Pt c/o increase in SOB, prod cough with green mucus, chest tightness due chest congestion, and weakness x 2 days. Pt states she is taking Spiriva respimat and Breo as prescribed but is not taking the hyertonic saline neb. Pt request we call her back at [phone] ext [phone]. Pt denies f/c/s. Will send to DOD as MR is unavailable.   Dr. Halford Chessman please advise. Thanks.

## 2016-11-24 ENCOUNTER — Telehealth: Payer: Self-pay | Admitting: Pulmonary Disease

## 2016-11-24 NOTE — Telephone Encounter (Signed)
Called by Molly Ross who states that she has finished her course of Azithromycin for an acute flare of Bronchiectasis  and feels no better. She is still coughing up green sputum and c/o pleuritic chest pain. She has not take her temperature.  I recommended that she go to the Emergency Department for evaluation and possible hospital admission. She refuses to go the the Emergency Department as she is caring for an invalid sister. I will call a prescription in for Doxycycline 100 mg, #10, Sig: Take 1 tab PO BID X 5 days. Should she not improve on this therapy, she has no choice but to go the Emergency Department for further care.

## 2016-11-25 ENCOUNTER — Telehealth: Payer: Self-pay | Admitting: Pulmonary Disease

## 2016-11-25 MED ORDER — PREDNISONE 10 MG PO TABS: ORAL_TABLET | ORAL | 0 refills | Status: DC

## 2016-11-25 NOTE — Addendum Note (Signed)
Addended by: Maryanna Shape A on: 11/25/2016 02:43 PM   Modules accepted: Orders

## 2016-11-25 NOTE — Telephone Encounter (Signed)
Molly Ross  Also call in Please take prednisone 40 mg x1 day, then 30 mg x1 day, then 20 mg x1 day, then 10 mg x1 day, and then 5 mg x1 day and stop  If not better she has option of acute visit  Dr. Brand Males, M.D., [facility].C.P Pulmonary and Critical Care Medicine Staff Physician [facility] Pager: [phone], If no answer or between  15:00h - 7:00h: call [phone]  11/25/2016 6:44 AM

## 2016-11-25 NOTE — Telephone Encounter (Signed)
Called by Pamala Hurry who relates [facility] for Prednisone that Dr. Chase Caller wanted her to have. [facility]

## 2016-11-25 NOTE — Telephone Encounter (Signed)
Rx sent to preferred pharmacy. Pt aware and voiced understanding. Pt states she is feeling slightly better today. I offered pt an appointment, pt reports she would like to give prednisone a try first and contact us if not any better. Nothing further needed.

## 2016-11-26 ENCOUNTER — Other Ambulatory Visit: Payer: Self-pay

## 2016-11-26 MED ORDER — PREDNISONE 10 MG PO TABS
ORAL_TABLET | ORAL | 0 refills | Status: DC
Start: 2016-11-26 — End: 2016-12-30

## 2016-11-27 ENCOUNTER — Other Ambulatory Visit: Payer: Self-pay | Admitting: Family Medicine

## 2016-11-27 DIAGNOSIS — F419 Anxiety disorder, unspecified: Secondary | ICD-10-CM

## 2016-11-27 NOTE — Telephone Encounter (Signed)
Is this okay to refill? 

## 2016-11-27 NOTE — Telephone Encounter (Signed)
Ok to refill, but at some point she might want to make an appointment to discuss if needing long-term/regularly.  I know she is under a lot of stress right now, so fine to refill.  But if worsening/ongoing anxiety, or chronic trouble sleeping, there might be better options available.

## 2016-12-02 ENCOUNTER — Encounter: Payer: Self-pay | Admitting: Emergency Medicine

## 2016-12-02 ENCOUNTER — Other Ambulatory Visit: Payer: Self-pay | Admitting: *Deleted

## 2016-12-02 ENCOUNTER — Ambulatory Visit (INDEPENDENT_AMBULATORY_CARE_PROVIDER_SITE_OTHER): Payer: Medicare Other | Admitting: Emergency Medicine

## 2016-12-02 DIAGNOSIS — J479 Bronchiectasis, uncomplicated: Secondary | ICD-10-CM | POA: Diagnosis not present

## 2016-12-02 MED ORDER — TIOTROPIUM BROMIDE MONOHYDRATE 2.5 MCG/ACT IN AERS: 2.0000 | INHALATION_SPRAY | Freq: Two times a day (BID) | RESPIRATORY_TRACT | 5 refills | Status: DC

## 2016-12-02 NOTE — Progress Notes (Signed)
Subjective:    Patient ID: Molly Ross, female    DOB: 01/28/1941, 75 y.o.   MRN: [id number]  HPI 75 year old woman with a history of COPD, bronchiectasis, hiatal hernia, coronary disease. She is followed by Dr Chase Caller in our office. She called weeks ago with increased cough, dyspnea, purulent mucous. She was treated with azithromycin at that time. After 6 days she was not improving so she was then given doxycycline plus a prednisone taper. She presents today for further eval. She is doing a bit better, less cough, less purulent mucous. She has been on Spiriva Respimat but ran out of it a month ago, she is taking Breo. She uses albuterol about once a day. She is using flutter valve prn. As hypertonic saline nebulizers on her medication list but she has not been using these.    Review of Systems As per HPI  Past Medical History:  Diagnosis Date  . Colon polyp   . Shingles 1/09 and 07/2010  . Unspecified vitamin D deficiency 02/2011 and 2016     Family History  Problem Relation Age of Onset  . Stroke Mother   . Diabetes Mother   . Stroke Sister 76    brainstem stroke  . Diabetes Sister   . Ovarian cancer Sister 13    metastatic to lung, liver  . Cancer Sister      Social History   Social History  . Marital status: Widowed    Spouse name: N/A  . Number of children: N/A  . Years of education: N/A   Occupational History  . [employer]    Social History Main Topics  . Smoking status: Former Smoker    Packs/day: 2.00    Years: 24.00    Types: Cigarettes    Quit date: 06/28/1980  . Smokeless tobacco: Never Used     Comment: smoked 1-2 PPD x 24 years;  . Alcohol use 0.0 oz/week     Comment: 1 glass of wine 3x/week  . Drug use: No  . Sexual activity: Not on file   Other Topics Concern  . Not on file   Social History Narrative   Lives with [name] (her sister).  Widowed (2011).  Son lives in [place], granddaughter lives in [place] (with her mother).    Also has step grandson.   She works at the [employer] in [place]     No Known Allergies   Outpatient Medications Prior to Visit  Medication Sig Dispense Refill  . Albuterol Sulfate (PROAIR RESPICLICK) 123XX123 (90 Base) MCG/ACT AEPB Inhale 2 puffs into the lungs every 6 (six) hours. Prn wheezing or shortness of breath 1 each 0  . ALPRAZolam (XANAX) 0.25 MG tablet TAKE 1 TABLET BY MOUTH 3 TIMES A DAY AS NEEDED FOR ANXIETY 20 tablet 0  . aspirin EC 81 MG tablet Take 81 mg by mouth daily.    Marland Kitchen atorvastatin (LIPITOR) 40 MG tablet Take 1 tablet (40 mg total) by mouth daily. 90 tablet 1  . Calcium-Magnesium-Vitamin D (CALCIUM 500 PO) Take 1 tablet by mouth daily.    . Cholecalciferol (VITAMIN D) 2000 UNITS tablet Take 2,000 Units by mouth daily.    . fish oil-omega-3 fatty acids 1000 MG capsule Take 3 g by mouth daily.     . Fluticasone Furoate-Vilanterol (BREO ELLIPTA) 100-25 MCG/INH AEPB Inhale 1 puff into the lungs daily. 60 each 0  . Multiple Vitamins-Minerals (MULTIVITAMIN WITH MINERALS) tablet Take 1 tablet by mouth daily.    Marland Kitchen  NITROSTAT 0.4 MG SL tablet Reported on 12/25/2015  12  . Respiratory Therapy Supplies (FLUTTER) DEVI Use as directed 1 each 0  . sodium chloride HYPERTONIC 3 % nebulizer solution Take by nebulization 2 (two) times daily. 750 mL 11  . Tiotropium Bromide Monohydrate (SPIRIVA RESPIMAT) 2.5 MCG/ACT AERS Inhale 2 puffs into the lungs 2 (two) times daily. (Patient taking differently: Inhale 1 puff into the lungs 2 (two) times daily. ) 1 Inhaler 5  . azithromycin (ZITHROMAX) 250 MG tablet Take 2 today, then 1 daily until gone. (Patient not taking: Reported on 12/02/2016) 6 tablet 0  . predniSONE (DELTASONE) 10 MG tablet 3 tabs daily X2 days, 2 tabs daily X2 days, 1 tab daily X2 days. (Patient not taking: Reported on 12/02/2016) 12 tablet 0  . predniSONE (DELTASONE) 10 MG tablet Take 4 tabs X 1 day, 3 tabs X 1 day, 2 tabs X 1 day, 1 tab X 1 day, 0.5 tab X 1 day then  stop (Patient not taking: Reported on 12/02/2016) 11 tablet 0   No facility-administered medications prior to visit.         Objective:   Physical Exam Vitals:   12/02/16 1357 12/02/16 1358  BP:  120/66  Pulse:  82  SpO2:  95%  Weight: 143 lb 3.2 oz (65 kg)   Height: 5\' 9"  (1.753 m)    Gen: Pleasant, well-nourished, in no distress,  normal affect  ENT: No lesions,  mouth clear,  oropharynx clear, no postnasal drip  Neck: No JVD, no TMG, no carotid bruits  Lungs: No use of accessory muscles, few bilateral rhonchi  Cardiovascular: RRR, heart sounds normal, no murmur or gallops, no peripheral edema  Musculoskeletal: No deformities, no cyanosis or clubbing  Neuro: alert, non focal  Skin: Warm, no lesions or rashes      Assessment & Plan:  Bronchiectasis without complication ([place]) With a recent acute exacerbation that was treated with antibiotics and prednisone. She is somewhat improved although clearly not back at her baseline. I believe that this is because she is out of Spiriva, is not doing her maintenance routine. We will continue Breo, restart Spiriva Respimat, restart flutter valve and hypertonic saline nebulizers.  Baltazar Apo, MD, PhD 12/02/2016, 2:12 PM [facility] [phone] or if no answer [phone]

## 2016-12-02 NOTE — Assessment & Plan Note (Signed)
With a recent acute exacerbation that was treated with antibiotics and prednisone. She is somewhat improved although clearly not back at her baseline. I believe that this is because she is out of Spiriva, is not doing her maintenance routine. We will continue Breo, restart Spiriva Respimat, restart flutter valve and hypertonic saline nebulizers.

## 2016-12-02 NOTE — Patient Instructions (Addendum)
Continue your Breo once a day Restart Spiriva respimat 2 puffs once a day Take albuterol 2 puffs up to every 4 hours if needed for shortness of breath.  Start using your flutter valve once a day on a schedule.  Start using your saline nebulizer once a day on a schedule.  Follow with Dr Chase Caller in 3-4 weeks to assess your progress.

## 2016-12-14 ENCOUNTER — Other Ambulatory Visit: Payer: Self-pay | Admitting: Family Medicine

## 2016-12-14 DIAGNOSIS — F419 Anxiety disorder, unspecified: Secondary | ICD-10-CM

## 2016-12-17 NOTE — Telephone Encounter (Signed)
Last filled #20 12/13.  Appears to be taking daily.  She has upcoming appointment.  Okay to refill #20

## 2016-12-17 NOTE — Telephone Encounter (Signed)
Is this okay to refill? 

## 2016-12-17 NOTE — Telephone Encounter (Signed)
Called in med to pharmacy  

## 2016-12-29 NOTE — Progress Notes (Signed)
Chief Complaint  Patient presents with  . Medicare Wellness    fasting AWV/CPE with pelvic exam. Going for eye exam today @ 2:00pm with Dr. Teodoro Ross. No concerns.     Molly Ross is a 76 y.o. female who presents for annual physical exam, wellness visit and follow-up on chronic medical conditions.  She has the following concerns:  Anxiety:  Her sister has had medical issues, been hospitalized, and causing patient a lot of anxiety. She has been using alprazolam every night to get to sleep while Molly Ross has been in rehab, but needed it more frequently when she was home alone. She has been in a rehab facility for about 3 weeks, expects her to be coming home soon. Thinks her anxiety will worsen when she comes home.  CAD: She was noted to have coronary calcification in 2 vessels on CT scan in 2016.She was referred to Dr. Wynonia Ross and had a myocardial perfusion scan. She exercised 5 minutes 30 seconds into Bruce stage II with no EKG changes. She had no evidence of ischemia with an ejection fraction of 76%. She has not had any recurrent chest pain since then. She has been compliant with taking aspirin and statin. She had some tolerability issues with the 72m atorvastatin (discomfort in her legs).  These symptoms resolved after changing to 459mevery other day (rather than cutting dose in 1/2). She later started taking 1/2 tablet daily and doesn't recall any side effects. She inadvertantly stopped taking this, didn't get it refilled. Thinks she hasn't taken it in at least 3 months.  Emphysema and bronchiectasis: Under the care of pulmonologist. She had an exacerbation last month requiring antibiotics (azithro, f/b Doxy when she didn't improve) and prednisone taper.  She last saw pulmonary a few weeks ago.  She continues on Breo, Spiriva Respimat, flutter valve and hypertonic saline nebulizers. She feels like she is back to baseline.  Vitamin D deficiency: s/p 12 weeks of rx replacement in February  2016. She continues to take MVI and vitamin D daily.  Vitamin D level 12/2015 was 39. Currently she is taking MVI and Ca+D, not a separate D.  Immunization History  Administered Date(s) Administered  . Influenza, High Dose Seasonal PF 01/26/2014, 10/19/2014, 08/30/2015, 08/08/2016  . Pneumococcal Conjugate-13 12/25/2015   She was given written rx last year (12/2015) to get Tdap and Zostavax at pharmacy. She admits she never got these vaccines.  Last Pap smear: 12/2015, normal, no high risk HPV detected Last mammogram: 01/2016 Last colonoscopy:  06/2007 Molly Ross)--polyp. No path report available to me Last DEXA: 02/2011, normal (T-1.) Dentist: twice yearly Ophtho: has appointment today; went last last year. Exercise: walks on the job. Home exercises 2-3x/week (stretches and aerobics, non-weightbearing)  Other doctors caring for patient include: Pulmonary: Dr. RaChase Ross: Dr. TiWynonia Lawmanentist: Dr. SaGeanie Ross RaSparksGI:  Dr. HaAmedeo Ross: Dr. StTeodoro Ross Depression screen:  Negative Fall Screen: negative Functional Status screen: notable only for some leakage of urine (with cough/sneeze).  See screen in epic.    End of Life Discussion:  Patient does not have a living will and medical power of attorney. She was given information last year, but has not completed this yet.  Past Medical History:  Diagnosis Date  . Colon polyp   . Shingles 1/09 and 07/2010  . Unspecified vitamin D deficiency 02/2011 and 2016    Past Surgical History:  Procedure Laterality Date  . SHOULDER SURGERY  03/2011  for frozen shoulder, and cyst removal (Dr. Percell Ross)  . TONSILLECTOMY  child  . VIDEO BRONCHOSCOPY Bilateral 06/21/2016   Procedure: VIDEO BRONCHOSCOPY WITHOUT FLUORO;  Surgeon: Molly Males, MD;  Location: [facility];  Service: Cardiopulmonary;  Laterality: Bilateral;    Social History   Social History  . Marital status: Widowed    Spouse name: N/A   . Number of children: N/A  . Years of education: N/A   Occupational History  . [employer]    Social History Main Topics  . Smoking status: Former Smoker    Packs/day: 2.00    Years: 24.00    Types: Cigarettes    Quit date: 06/28/1980  . Smokeless tobacco: Never Used     Comment: smoked 1-2 PPD x 24 years;  . Alcohol use 0.0 oz/week     Comment: 1 glass of wine 3x/week  . Drug use: No  . Sexual activity: Not on file   Other Topics Concern  . Not on file   Social History Narrative   Lives with Molly Ross (her sister).  Widowed (2011).  Son lives in [place], granddaughter lives in [place] (with her mother).   Also has step grandson.   She works at the [employer] in [place]    Family History  Problem Relation Age of Onset  . Stroke Mother   . Diabetes Mother   . Stroke Sister 94    brainstem stroke  . Diabetes Sister   . Seizures Sister   . Ovarian cancer Sister 60    metastatic to lung, liver  . Cancer Sister     Outpatient Encounter Prescriptions as of 12/30/2016  Medication Sig Note  . Albuterol Sulfate (PROAIR RESPICLICK) 779 (90 Base) MCG/ACT AEPB Inhale 2 puffs into the lungs every 6 (six) hours. Prn wheezing or shortness of breath   . ALPRAZolam (XANAX) 0.25 MG tablet TAKE 1 TABLET BY MOUTH 3 TIMES A DAY AS NEEDED FOR ANXIETY 12/30/2016: She is taking 1 tablet 30 minutes before sleep every night (while sister is in rehab, not yet home).  Marland Kitchen aspirin EC 81 MG tablet Take 81 mg by mouth daily.   . Calcium-Magnesium-Vitamin D (CALCIUM 500 PO) Take 1 tablet by mouth daily.   . Cholecalciferol (VITAMIN D) 2000 UNITS tablet Take 2,000 Units by mouth daily. 12/30/2016: Not currently taking this, just the Ca+D and MVI  . fish oil-omega-3 fatty acids 1000 MG capsule Take 3 g by mouth daily.    . Fluticasone Furoate-Vilanterol (BREO ELLIPTA) 100-25 MCG/INH AEPB Inhale 1 puff into the lungs daily.   . Multiple Vitamins-Minerals (MULTIVITAMIN WITH  MINERALS) tablet Take 1 tablet by mouth daily.   Marland Kitchen Respiratory Therapy Supplies (FLUTTER) DEVI Use as directed   . Tiotropium Bromide Monohydrate (SPIRIVA RESPIMAT) 2.5 MCG/ACT AERS Inhale 2 puffs into the lungs 2 (two) times daily.   Marland Kitchen atorvastatin (LIPITOR) 40 MG tablet Take 1 tablet (40 mg total) by mouth daily. (Patient not taking: Reported on 12/30/2016) 12/30/2016: Was taking 1/2 tablet daily until 3 months ago, then stopped (inadvertantly)  . NITROSTAT 0.4 MG SL tablet Reported on 12/25/2015   . sodium chloride HYPERTONIC 3 % nebulizer solution Take by nebulization 2 (two) times daily. (Patient not taking: Reported on 12/30/2016)   . [DISCONTINUED] azithromycin (ZITHROMAX) 250 MG tablet Take 2 today, then 1 daily until gone. (Patient not taking: Reported on 12/02/2016)   . [DISCONTINUED] predniSONE (DELTASONE) 10 MG tablet 3 tabs daily X2 days, 2 tabs daily X2 days,  1 tab daily X2 days. (Patient not taking: Reported on 12/02/2016)   . [DISCONTINUED] predniSONE (DELTASONE) 10 MG tablet Take 4 tabs X 1 day, 3 tabs X 1 day, 2 tabs X 1 day, 1 tab X 1 day, 0.5 tab X 1 day then stop (Patient not taking: Reported on 12/02/2016)    No facility-administered encounter medications on file as of 12/30/2016.     No Known Allergies  ROS: The patient denies anorexia, fever, weight changes, headaches,  vision changes, decreased hearing, ear pain, sore throat, breast concerns, chest pain, palpitations, dizziness, syncope, dyspnea on exertion, cough, swelling, nausea, vomiting, diarrhea, constipation, abdominal pain, melena, hematochezia, indigestion/heartburn, hematuria, incontinence (some stress incontinence), dysuria, vaginal bleeding, discharge, odor or itch, genital lesions, joint pains, numbness, tingling, weakness, tremor, suspicious skin lesions, depression, abnormal bleeding/bruising, or enlarged lymph nodes. Breathing is improved since recent illness, back to baseline. Some burping after eating, better  since she is more careful about what she eats. Denies dysphagia. Itchy skin and some slight bleeding from nose (mucus streaked with blood) since the weather has been cold. Slightly lightheaded last night, worse after bending over and standing up. She didn't drink much water yesterday.    PHYSICAL EXAM:  BP 140/70 (BP Location: Left Arm, Patient Position: Sitting, Cuff Size: Normal)   Pulse 68   Ht _0  (1.753 m)   Wt 144 lb 3.2 oz (65.4 kg)   BMI 21.29 kg/m   118/60 on repeat by MD  General Appearance:    Alert, cooperative, no distress, appears stated age  Head:    Normocephalic, without obvious abnormality, atraumatic  Eyes:    PERRL, conjunctiva/corneas clear, EOM's intact, fundi    benign  Ears:    Normal TM's and external ear canals  Nose:   Nares normal, mucosa normal, no drainage or sinus   tenderness  Throat:   Lips, mucosa, and tongue normal; teeth and gums normal  Neck:   Supple, no lymphadenopathy;  thyroid:  no   enlargement/tenderness/nodules; no carotid bruit or JVD  Back:    Spine nontender, no curvature, ROM normal, no CVA tenderness  Lungs:     Clear to auscultation bilaterally without wheezes, rales or ronchi; respirations unlabored.  Chest Wall:    No tenderness or deformity   Heart:    Regular rate and rhythm, S1 and S2 normal, no murmur, rub   or gallop. Occasional skipped beat.  Breast Exam:    No tenderness, masses, or nipple discharge or inversion.      No axillary lymphadenopathy  Abdomen:     Soft, non-tender, nondistended, normoactive bowel sounds, no masses, no hepatosplenomegaly  Genitalia:    Normal external genitalia without lesions, some atrophy noted.  BUS and vagina normal; no cervical motion tenderness. No abnormal vaginal discharge.  Uterus and adnexa not enlarged, nontender, no masses.  Pap not performed  Rectal:    Normal tone, no masses or tenderness; guaiac negative stool  Extremities:   No clubbing, cyanosis or edema  Pulses:   2+ and  symmetric all extremities  Skin:   Skin color, texture, turgor normal, no lesions. She has many seborrheic keratoses throughout her body many of which are large, largest being the one on her right lateral breast.  There are also many scattered angiomas. At the 5 o'clock position of the right breast there is an atypical appearing SK, measuring 6 x 41m, and is dark on top half, flesh colored inferiorly. She reports this one is fairly new.  Lymph nodes:   Cervical, supraclavicular, and axillary nodes normal  Neurologic:   CNII-XII intact, normal strength, sensation and gait; reflexes 2+ and symmetric throughout                                  Psych:   Normal mood, affect, hygiene and grooming.   ASSESSMENT/PLAN:  Annual physical exam - Plan: POCT Urinalysis Dipstick  Encounter for Medicare annual wellness exam  Bronchiectasis without complication ([place]) - recent exacerbation, now back to baseline. Encouraged compliance with her current regimen  COPD, moderate ([place]) - recent exacerbation, now back to baseline. Encouraged compliance with her current regimen  Medication monitoring encounter - Plan: CBC with Differential/Platelet, Comprehensive metabolic panel, Lipid panel, VITAMIN D 25 Hydroxy (Vit-D Deficiency, Fractures)  Vitamin D deficiency - continue daily supplementation - Plan: VITAMIN D 25 Hydroxy (Vit-D Deficiency, Fractures)  Atherosclerosis of aorta ([facility]) - restart statin--change to 69m since she tolerated this better than 433m- Plan: atorvastatin (LIPITOR) 20 MG tablet, Lipid panel  Postmenopausal estrogen deficiency - Plan: DG Bone Density  High risk medication use - Plan: DG Bone Density  Immunization due - written rx given for TdaP, to get at pharmacy. Shingrix recommended when available.  pneumovax given in office. - Plan: Pneumococcal polysaccharide vaccine 23-valent greater than or equal to 2yo subcutaneous/IM  Anxiety disorder, unspecified type - discussed meds in  detail--expect worsening of anxiety when sister returns home. Start Paxil. Risks/side effects discussed. Don't use xanax for insomnia - Plan: PARoxetine (PAXIL) 10 MG tablet  Dizziness and giddiness - Plan: CBC with Differential/Platelet, TSH   Tdap--not covered by insurance to give here.  Written rx given for patient to get at pharmacy Pneumovax today Shingrix recommended when available.  c-met, lipid, CBC, TSH, Vit D DEXA--order entered for [place]  >60 min face to face, with significant counseling re: anxiety, insomnia, medications.   Start taking paxil 1028mablets.  Start at 1/2 tablet at bedtime, and after a week, if not having side effects, increase to the full tablet.  Take this in the evening, as it may be sedating (which can help you sleep).  You may overlap it with the alprazolam initially, but the goal eventually is to NOT need the alprazolam on a daily basis.  If you still need help getting to sleep, consider trying melatonin, or 25-22m87m diphenhydramine (found in Simply Sleep, Zquil, benadryl).  Ultimately, if you have chronic trouble falling asleep, I'd rather use a sleep medication and not alprazolam on a regular basis.    rx lipitor 20mg44mtake daily (rather than 1/2 40mg 26my). Today's lipids are off medication.  MOST form was reviewed and updated, no changes. Full Code, Full Care  Discussed monthly self breast exams and yearly mammograms; at least 30 minutes of aerobic activity at least 5 days/week and weight-bearing exercise 2x/week; proper sunscreen use reviewed; healthy diet, including goals of calcium and vitamin D intake and alcohol recommendations (less than or equal to 1 drink/day) reviewed; regular seatbelt use; changing batteries in smoke detectors.  Immunization recommendations discussed--see above.  Colonoscopy due in July (pt reports being told 10 year f/u). Living will and healthcare power of attorney information given and discussed  (again).   Medicare Attestation I have personally reviewed: The patient's medical and social history Their use of alcohol, tobacco or illicit drugs Their current medications and supplements The patient's functional ability including ADLs,fall risks, home safety  risks, cognitive, and hearing and visual impairment Diet and physical activities Evidence for depression or mood disorders  The patient's weight, height, and BMI have been recorded in the chart.  I have made referrals, counseling, and provided education to the patient based on review of the above and I have provided the patient with a written personalized care plan for preventive services.     Deshia Vanderhoof A, MD   12/29/2016

## 2016-12-30 ENCOUNTER — Ambulatory Visit (INDEPENDENT_AMBULATORY_CARE_PROVIDER_SITE_OTHER): Payer: Medicare Other | Admitting: Family Medicine

## 2016-12-30 ENCOUNTER — Encounter: Payer: Self-pay | Admitting: Family Medicine

## 2016-12-30 VITALS — BP 118/60 | HR 68 | Ht 69.0 in | Wt 144.2 lb

## 2016-12-30 DIAGNOSIS — F419 Anxiety disorder, unspecified: Secondary | ICD-10-CM | POA: Diagnosis not present

## 2016-12-30 DIAGNOSIS — J479 Bronchiectasis, uncomplicated: Secondary | ICD-10-CM

## 2016-12-30 DIAGNOSIS — I700 Atherosclerosis of aorta: Secondary | ICD-10-CM | POA: Diagnosis not present

## 2016-12-30 DIAGNOSIS — E559 Vitamin D deficiency, unspecified: Secondary | ICD-10-CM | POA: Diagnosis not present

## 2016-12-30 DIAGNOSIS — J449 Chronic obstructive pulmonary disease, unspecified: Secondary | ICD-10-CM

## 2016-12-30 DIAGNOSIS — Z23 Encounter for immunization: Secondary | ICD-10-CM

## 2016-12-30 DIAGNOSIS — Z0000 Encounter for general adult medical examination without abnormal findings: Secondary | ICD-10-CM | POA: Diagnosis not present

## 2016-12-30 DIAGNOSIS — Z780 Asymptomatic menopausal state: Secondary | ICD-10-CM | POA: Diagnosis not present

## 2016-12-30 DIAGNOSIS — R42 Dizziness and giddiness: Secondary | ICD-10-CM | POA: Diagnosis not present

## 2016-12-30 DIAGNOSIS — Z79899 Other long term (current) drug therapy: Secondary | ICD-10-CM

## 2016-12-30 DIAGNOSIS — Z5181 Encounter for therapeutic drug level monitoring: Secondary | ICD-10-CM

## 2016-12-30 DIAGNOSIS — H5203 Hypermetropia, bilateral: Secondary | ICD-10-CM | POA: Diagnosis not present

## 2016-12-30 LAB — LIPID PANEL
Cholesterol: 165 mg/dL (ref <200)
HDL: 79 mg/dL (ref >50)
LDL CALC: 77 mg/dL (ref <100)
TRIGLYCERIDES: 47 mg/dL (ref <150)
Total CHOL/HDL Ratio: 2.1 Ratio (ref <5.0)
VLDL: 9 mg/dL (ref <30)

## 2016-12-30 LAB — COMPREHENSIVE METABOLIC PANEL
ALBUMIN: 4.1 g/dL (ref 3.6–5.1)
ALT: 9 U/L (ref 6–29)
AST: 18 U/L (ref 10–35)
Alkaline Phosphatase: 70 U/L (ref 33–130)
BUN: 16 mg/dL (ref 7–25)
CHLORIDE: 102 mmol/L (ref 98–110)
CO2: 27 mmol/L (ref 20–31)
Calcium: 9.8 mg/dL (ref 8.6–10.4)
Creat: 0.55 mg/dL — ABNORMAL LOW (ref 0.60–0.93)
GLUCOSE: 76 mg/dL (ref 65–99)
Potassium: 3.9 mmol/L (ref 3.5–5.3)
Sodium: 140 mmol/L (ref 135–146)
Total Bilirubin: 0.9 mg/dL (ref 0.2–1.2)
Total Protein: 7.2 g/dL (ref 6.1–8.1)

## 2016-12-30 LAB — CBC WITH DIFFERENTIAL/PLATELET
BASOS ABS: 0 {cells}/uL (ref 0–200)
Basophils Relative: 0 %
EOS PCT: 1 %
Eosinophils Absolute: 134 cells/uL (ref 15–500)
HCT: 39.7 % (ref 35.0–45.0)
HEMOGLOBIN: 12.8 g/dL (ref 11.7–15.5)
LYMPHS ABS: 1876 {cells}/uL (ref 850–3900)
LYMPHS PCT: 14 %
MCH: 28.8 pg (ref 27.0–33.0)
MCHC: 32.2 g/dL (ref 32.0–36.0)
MCV: 89.2 fL (ref 80.0–100.0)
MONOS PCT: 8 %
MPV: 9.7 fL (ref 7.5–12.5)
Monocytes Absolute: 1072 cells/uL — ABNORMAL HIGH (ref 200–950)
NEUTROS PCT: 77 %
Neutro Abs: 10318 cells/uL — ABNORMAL HIGH (ref 1500–7800)
Platelets: 361 10*3/uL (ref 140–400)
RBC: 4.45 MIL/uL (ref 3.80–5.10)
RDW: 13.5 % (ref 11.0–15.0)
WBC: 13.4 10*3/uL — AB (ref 4.0–10.5)

## 2016-12-30 LAB — TSH: TSH: 2.01 mIU/L

## 2016-12-30 LAB — POCT URINALYSIS DIPSTICK
Bilirubin, UA: NEGATIVE
GLUCOSE UA: NEGATIVE
KETONES UA: NEGATIVE
Leukocytes, UA: NEGATIVE
Nitrite, UA: NEGATIVE
Protein, UA: NEGATIVE
RBC UA: NEGATIVE
SPEC GRAV UA: 1.025
Urobilinogen, UA: NEGATIVE
pH, UA: 7

## 2016-12-30 MED ORDER — PAROXETINE HCL 10 MG PO TABS: ORAL_TABLET | ORAL | 5 refills | Status: DC

## 2016-12-30 MED ORDER — ATORVASTATIN CALCIUM 20 MG PO TABS: 20.0000 mg | ORAL_TABLET | Freq: Every day | ORAL | 1 refills | Status: DC

## 2016-12-30 NOTE — Patient Instructions (Addendum)
HEALTH MAINTENANCE RECOMMENDATIONS:  It is recommended that you get at least 30 minutes of aerobic exercise at least 5 days/week (for weight loss, you may need as much as 60-90 minutes). This can be any activity that gets your heart rate up. This can be divided in 10-15 minute intervals if needed, but try and build up your endurance at least once a week.  Weight bearing exercise is also recommended twice weekly.  Eat a healthy diet with lots of vegetables, fruits and fiber.  "Colorful" foods have a lot of vitamins (ie green vegetables, tomatoes, red peppers, etc).  Limit sweet tea, regular sodas and alcoholic beverages, all of which has a lot of calories and sugar.  Up to 1 alcoholic drink daily may be beneficial for women (unless trying to lose weight, watch sugars).  Drink a lot of water.  Calcium recommendations are 1200-1500 mg daily (1500 mg for postmenopausal women or women without ovaries), and vitamin D 1000 IU daily.  This should be obtained from diet and/or supplements (vitamins), and calcium should not be taken all at once, but in divided doses.  Monthly self breast exams and yearly mammograms for women over the age of 43 is recommended.  Sunscreen of at least SPF 30 should be used on all sun-exposed parts of the skin when outside between the hours of 10 am and 4 pm (not just when at beach or pool, but even with exercise, golf, tennis, and yard work!)  Use a sunscreen that says "broad spectrum" so it covers both UVA and UVB rays, and make sure to reapply every 1-2 hours.  Remember to change the batteries in your smoke detectors when changing your clock times in the spring and fall.  Use your seat belt every time you are in a car, and please drive safely and not be distracted with cell phones and texting while driving.   Molly Ross , Thank you for taking time to come for your Medicare Wellness Visit. I appreciate your ongoing commitment to your health goals. Please review the following  plan we discussed and let me know if I can assist you in the future.   These are the goals we discussed: Goals    None      This is a list of the screening recommended for you and due dates:  Health Maintenance  Topic Date Due  . Tetanus Vaccine  11/08/1960  . Shingles Vaccine  11/08/2001  . Pneumonia vaccines (2 of 2 - PPSV23) 12/24/2016  . Colon Cancer Screening  06/24/2017  . Flu Shot  Completed  . DEXA scan (bone density measurement)  Completed   Please go to the pharmacy with the written prescription provided and get the TdaP vaccine. Rather than getting the zostavax recommended last year, I recommend waiting for the newly approved vaccine.  I recommend getting the new shingles vaccine (Shingrix) when available. You will need to check with your insurance to see if it is covered, and if covered by Medicare Part D, you need to get from the pharmacy rather than our office.  It is a series of 2 injections, spaced 2 months apart.  You were given pneumovax today, so now pneumonia vaccines are up to date.  Start taking paxil 10mg  tablets.  Start at 1/2 tablet at bedtime, and after a week, if not having side effects, increase to the full tablet.  Take this in the evening, as it may be sedating (which can help you sleep).  You may overlap it  with the alprazolam initially, but the goal eventually is to NOT need the alprazolam on a daily basis.  If you still need help getting to sleep, consider trying melatonin, or 25-50mg  of diphenhydramine (found in Simply Sleep, Zquil, benadryl).  Ultimately, if you have chronic trouble falling asleep, I'd rather use a sleep medication and not alprazolam on a regular basis.

## 2016-12-31 LAB — VITAMIN D 25 HYDROXY (VIT D DEFICIENCY, FRACTURES): VIT D 25 HYDROXY: 44 ng/mL (ref 30–100)

## 2017-01-20 ENCOUNTER — Telehealth: Payer: Self-pay | Admitting: *Deleted

## 2017-01-20 NOTE — Telephone Encounter (Signed)
Spoke with patient she said that she started the medication right after her visit. Said she called here and left a voicemail on [employer] (somehow she got her voicemail and she is not sure how, she said it was after hours) and never got a call back and did not pursue it as she has been busy so she called me today. She said she really doesn't want to get back on this medication even at the 1/4 tablet as it made her feel that badly, she couldn't even work (zofran offered). She also said the tablets are so small it would be virtually impossible to cut down to that size. She said she is coming in 02/17/17 and she has been fine for the last two weeks so she thinks she should be fine until then. She asked if maybe you wanted to switch to something else, I told her I would ask but you normally require OV for any med changes.

## 2017-01-20 NOTE — Telephone Encounter (Signed)
This was prescribed 2-3 weeks ago.  Did she just try it? Did she remember to start at 1/2 tablet? Did the nausea last all day long, or just come/go?  Nausea usually improves after 3-4 days, sometimes can take a full week.  She as supposed to start at 1/2 tablet and increase only if tolerated after a week.  Clearly, we would have her stay on the 1/2 tablet for longer, until the nausea improves, hoping that it will.  She can trying cutting in 1/4-1/3 for a few days, then increase to 1/2 tablet and stay at the 1/2 tablet.  We can prescribe some zofran 4mg  if needed for nausea--not for her to take every day longterm with this medication, but just until the nausea resolves and she can tolerate it better.

## 2017-01-20 NOTE — Telephone Encounter (Signed)
If she has been fine the last 2 weeks, and wants to wait until her next visit, I'm okay with that.  If she would like to try something else before then, the things to discuss would be using a medication like trazadone at bedtime, which is really just for sleep, vs changing to another anxiety medication (to prevent anxiety). She currently has been using alprazolam to sleep at night, so how much anxiety she is having during the day would help decide which type of medication would be best.  Let me know if she doesn't want to wait, which type of medicine she would want to try

## 2017-01-20 NOTE — Telephone Encounter (Signed)
Patient took Paxil for 4 days but could not tolerate-was making her too nauseous. She stopped taking. Please advise.

## 2017-01-21 NOTE — Telephone Encounter (Signed)
Spoke with pt- she states she just wants to wait until appt.

## 2017-01-21 NOTE — Telephone Encounter (Signed)
LMTCB

## 2017-02-11 ENCOUNTER — Other Ambulatory Visit: Payer: Self-pay | Admitting: Family Medicine

## 2017-02-11 DIAGNOSIS — Z1231 Encounter for screening mammogram for malignant neoplasm of breast: Secondary | ICD-10-CM

## 2017-02-14 ENCOUNTER — Ambulatory Visit (INDEPENDENT_AMBULATORY_CARE_PROVIDER_SITE_OTHER): Payer: Medicare Other

## 2017-02-14 DIAGNOSIS — Z1231 Encounter for screening mammogram for malignant neoplasm of breast: Secondary | ICD-10-CM | POA: Diagnosis not present

## 2017-02-16 NOTE — Progress Notes (Signed)
Chief Complaint  Patient presents with  . Follow-up    6 week folllow up. Started paxil and could not tolerate. Not using xanax either. Wanted to know if she should scheudule her DEXA, order is in.     Patient presents for follow-up on anxiety.  She was started at 5mg  (1/2 of 10mg  tablet) of Paxil at her physical 6 weeks ago.  She reports she only took it for 1 week (at 5mg ), stopped due to severe nausea.   (we only learned of this on 2/5).  She declined offer for Zofran, to help her get through some of the initial nausea.  She has been off the medication. She denies any problems with anxiety--not needing alprazolam, sleeping fine.   Her sister [name] remains in rehab. She applied for CAP (for full day assistance).  She took her out to lunch yesterday (and home to visit her cat). She still needs to be reminded to put the brakes on her wheelchair.    Since Malinda isn't at home, she reports doing very well, denying any anxiety or problems sleeping.  She isn't sure exactly when she will be discharged from rehab, but there hasn't been any discharge discussion yet (expected sometime within the month).  She has not yet gone to pharmacy to get her Tdap. She had her mammogram recently, but did not schedule her DEXA yet.  PMH, PSH, SH reviewed  Outpatient Encounter Prescriptions as of 02/17/2017  Medication Sig Note  . Albuterol Sulfate (PROAIR RESPICLICK) [id number] (90 Base) MCG/ACT AEPB Inhale 2 puffs into the lungs every 6 (six) hours. Prn wheezing or shortness of breath   . aspirin EC 81 MG tablet Take 81 mg by mouth daily.   Marland Kitchen atorvastatin (LIPITOR) 20 MG tablet Take 1 tablet (20 mg total) by mouth daily.   . Calcium-Magnesium-Vitamin D (CALCIUM 500 PO) Take 1 tablet by mouth daily.   . Cholecalciferol (VITAMIN D) 2000 UNITS tablet Take 2,000 Units by mouth daily. 02/17/2017: Resumed taking  . fish oil-omega-3 fatty acids 1000 MG capsule Take 3 g by mouth daily.    . Fluticasone Furoate-Vilanterol (BREO  ELLIPTA) 100-25 MCG/INH AEPB Inhale 1 puff into the lungs daily.   . Multiple Vitamins-Minerals (MULTIVITAMIN WITH MINERALS) tablet Take 1 tablet by mouth daily.   Marland Kitchen Respiratory Therapy Supplies (FLUTTER) DEVI Use as directed   . Tiotropium Bromide Monohydrate (SPIRIVA RESPIMAT) 2.5 MCG/ACT AERS Inhale 2 puffs into the lungs 2 (two) times daily.   Marland Kitchen ALPRAZolam (XANAX) 0.25 MG tablet TAKE 1 TABLET BY MOUTH 3 TIMES A DAY AS NEEDED FOR ANXIETY (Patient not taking: Reported on 02/17/2017) 12/30/2016: She is taking 1 tablet 30 minutes before sleep every night (while sister is in rehab, not yet home).  Marland Kitchen NITROSTAT 0.4 MG SL tablet Reported on 12/25/2015   . sodium chloride HYPERTONIC 3 % nebulizer solution Take by nebulization 2 (two) times daily. (Patient not taking: Reported on 02/17/2017)   . [DISCONTINUED] PARoxetine (PAXIL) 10 MG tablet Start 1/2 tablet at bedtime; after a week increase to full tablet (if tolerated) (Patient not taking: Reported on 02/17/2017)    No facility-administered encounter medications on file as of 02/17/2017.    No Known Allergies  ROS:  No fever, chills, URI symptoms, depression, anxiety, insomnia, energy or weight changes or other concerns.  PHYSICAL EXAM:  BP 120/60 (BP Location: Left Arm, Patient Position: Sitting, Cuff Size: Normal)   Pulse 76   Ht 5\' 9"  (1.753 m)   Wt 148 lb  9.6 oz (67.4 kg)   BMI 21.94 kg/m    Well appearing, pleasant female, in good spirits. Alert and oriented, normal cranial nerves, gait Normal mood, affect, hygiene and grooming.  Full range of affect  ASSESSMENT/PLAN  Anxiety symptoms resolved--no longer requiring alprazolam, no trouble sleeping.  This may change once her sister returns home. Did not tolerate paxil due to nausea.  Further medicatl treatment isn't needed at this time.  F/u prn if recurrent anxiety symptoms develop.  Counseled some re: expected increased stress when she comes home.  Hopefully the assistance during the day  will help.     Please remember to get your Tetanus vaccine (TdaP) at the pharmacy.  Please call Jule Ser to schedule your bone density (order is in the system).  Okay to remain off all medications, since your anxiety is better and you are sleeping fine.  Let me know if things change once [name] gets home.

## 2017-02-17 ENCOUNTER — Ambulatory Visit (INDEPENDENT_AMBULATORY_CARE_PROVIDER_SITE_OTHER): Payer: Medicare Other | Admitting: Family Medicine

## 2017-02-17 ENCOUNTER — Encounter: Payer: Self-pay | Admitting: Family Medicine

## 2017-02-17 VITALS — BP 120/60 | HR 76 | Ht 69.0 in | Wt 148.6 lb

## 2017-02-17 DIAGNOSIS — F419 Anxiety disorder, unspecified: Secondary | ICD-10-CM

## 2017-02-17 NOTE — Patient Instructions (Signed)
  Please remember to get your Tetanus vaccine (TdaP) at the pharmacy.  Please call Molly Ross to schedule your bone density (order is in the system).  Okay to remain off all medications, since your anxiety is better and you are sleeping fine.  Let me know if things change once [name] gets home.

## 2017-02-24 DIAGNOSIS — H25811 Combined forms of age-related cataract, right eye: Secondary | ICD-10-CM | POA: Diagnosis not present

## 2017-02-24 DIAGNOSIS — H25812 Combined forms of age-related cataract, left eye: Secondary | ICD-10-CM | POA: Diagnosis not present

## 2017-04-09 DIAGNOSIS — H25812 Combined forms of age-related cataract, left eye: Secondary | ICD-10-CM | POA: Diagnosis not present

## 2017-04-09 DIAGNOSIS — H2512 Age-related nuclear cataract, left eye: Secondary | ICD-10-CM | POA: Diagnosis not present

## 2017-04-09 DIAGNOSIS — Z9842 Cataract extraction status, left eye: Secondary | ICD-10-CM | POA: Diagnosis not present

## 2017-04-22 ENCOUNTER — Other Ambulatory Visit: Payer: Self-pay | Admitting: Family Medicine

## 2017-04-23 ENCOUNTER — Other Ambulatory Visit: Payer: Self-pay | Admitting: Internal Medicine

## 2017-04-23 NOTE — Telephone Encounter (Signed)
Is this okay to refill? 

## 2017-04-23 NOTE — Telephone Encounter (Signed)
I think pulmonary likes to keep track of usage, so she should be getting this from pulm (also ensures her compliance in following up with them)

## 2017-04-24 ENCOUNTER — Ambulatory Visit (INDEPENDENT_AMBULATORY_CARE_PROVIDER_SITE_OTHER): Payer: Medicare Other | Admitting: Internal Medicine

## 2017-04-24 ENCOUNTER — Encounter: Payer: Self-pay | Admitting: Internal Medicine

## 2017-04-24 DIAGNOSIS — J471 Bronchiectasis with (acute) exacerbation: Secondary | ICD-10-CM | POA: Diagnosis not present

## 2017-04-24 MED ORDER — LEVOFLOXACIN 500 MG PO TABS: 500.0000 mg | ORAL_TABLET | Freq: Every day | ORAL | 0 refills | Status: DC

## 2017-04-24 MED ORDER — PREDNISONE 10 MG PO TABS: ORAL_TABLET | ORAL | 0 refills | Status: DC

## 2017-04-24 NOTE — Assessment & Plan Note (Signed)
Give sputum for gram stain and culture  Take prednisone 40 mg daily x 2 days, then 20mg  daily x 2 days, then 10mg  daily x 2 days, then 5mg  daily x 2 days and stop   take levaquin 500mg  once daily  X 7 days  Continue inhalers as before  Go to ER if worse  Followup [facility] call with sputum results ER if worse Otherwise as per prior schedule

## 2017-04-24 NOTE — Addendum Note (Signed)
Addended by: Collier Salina on: 04/24/2017 01:58 PM   Modules accepted: Orders

## 2017-04-24 NOTE — Patient Instructions (Signed)
ICD-9-CM ICD-10-CM   1. Bronchiectasis with (acute) exacerbation (HCC) 494.1 J47.1   Bronchiectasis with (acute) exacerbation (HCC) Give sputum for gram stain and culture  Take prednisone 40 mg daily x 2 days, then 20mg  daily x 2 days, then 10mg  daily x 2 days, then 5mg  daily x 2 days and stop   take levaquin 500mg  once daily  X 7 days  Continue inhalers as before  Go to ER if worse  Followup [facility] call with sputum results ER if worse Otherwise as per prior schedule

## 2017-04-24 NOTE — Progress Notes (Signed)
Subjective:     Patient ID: Ned Card, female   DOB: [birth year], 76 y.o.   MRN: [id number]  HPI    IOV 06/09/2015  Chief Complaint  Patient presents with  . Pulmonary Consult    Pt referred by Dr. Tomi Bamberger for abnormal CT.    76 year old female somewhat of a poor historian. History is gained from repeatedly asking the same questions in many different ways and also review of Dr Rita Ohara notes in the EMR   She reports that she is a 40 pack smoker having quit 34 years ago. In the 1970s and 1980s she had recurrent bouts of pneumonia which left her with bronchiectasis. At baseline she's had some chronic cough of unclear severity and shortness of breath with exertion of unclear severity but probably mild. Unclear if she has associated sputum. Unclear what aggravating and relieving factors where or are. Then in for a 2016 she developed over several weeks worsening cough with green sputum and some fever. Apparently was diagnosed with pneumonia. This then resulted in a CT scan of the chest and her 2016 which I personally reviewed and shows bilateral bronchiectasis particularly on the right middle lobe. She also had a nodule. Antibody treatment she improved. Then in April 2016 was started on Brio which inhaler therapy help her cough and shortness of breath significantly. Currently these are not much much of an issue for her. She has a good quality of life with the Brio. The Brio is expensive. She had follow-up CT scan of the chest 04/26/2015 that shows resolution of the nodule but persistence of the bronchiectasis. In fact she seems to have chronic bronchiectasis dating back on several chest x-rays although it back to 2009. She had test that she's had bronchiectasis since the 1980s. Currently no sputum production not much of a cough or shortness of breath. She denies having had bronchoscopy for the same.  She is not interested in much testing  Noted spirometry pre-breo March 2016  - fev 1.14L/45%, R 56 -  severe obstructive lung disease And post Breo in June 05 2015  - Fev 1.32L/52%, R 57 -> significantly improved but still in moderate category   OV 11/06/2015  Chief Complaint  Patient presents with  . Follow-up    Pt here after PFT. Pt states she respiratory infection in October and was on 2 rounds of abx and is now back to her baseline. Pt c/o mild non prod cough. Pt denies CP/tightness.    Previous heavy smoker with right middle lobe chronic bronchiectasis with findings suggestive of MAI and moderate/severe obstructive lung disease with findings of emphysema on CT chest - February and May 2016  - Last seen in June 2016. At that time he started Brio. Overall she has been doing well. However in October 2016 she had a respiratory exacerbation. She was treated with 2 rounds of anabiotic's and prednisone course and then she fell baseline. She is followed up with a pulmonary function test  t 10/30/2015) baseline and well. 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 Currently she is feeling well. Review of vaccine shows she is up-to-date with flu shot. She thinks she has had the pneumonia vaccine with her primary care physician but she's not sure and she is going to check with her.  No new issues.  OV 02/12/2016  Chief Complaint  Patient presents with  . Follow-up  Pt reports having some increased breathing issues since last OV- treated a few times with abx/pred for infections. Most recent x 1 week ago- Zpak w/Pred taper. Pt notes some green mucus but cough is much improved.   Heavy smoker with right middle lobe chronic bronchiectasis and some lingular bronchiectasis and scattered infiltrates in the lower lobe based on CT February and May 2016. May 2016 with improvement   - This is a routine follow-up. Last seen November 2016. She is compliant with her inhalers. In the interim approximately on  Valentine's Day 2017 had another flareup and he called in Z-Pak and prednisone. This has helped. Cough is significantly worse but it has improved although not at baseline. She slowly getting better. This will be her third exacerbation. Review of the chart shows an visualization of the CT chest which I did myself and showed her shows findings described above. Chart review also shows that do not had a complete bronchiectasis hematological and etiological workup. She is open to having bronchoscopy   OV 04/23/2016  Chief Complaint  Patient presents with  . Follow-up    Pt here after CT chest. Pt states her breathing has improved since last OV. Pt states she is still coughing in the morning but now there is less mucus production.      Heavy smoker with right middle lobe chronic bronchiectasis and some lingular bronchiectasis and scattered infiltrates in the lower lobe based on CT February and May 2016. PFT Nov 2016 -> fev 1.36L/51%, R 60. DLCO 16/51%   In  Nov 2016 I added spiriva and now on triple inhaler Rx- says with this symptoms she is significantly better. Still with cough. Some spuitum +. Labs for etiology reviewed done feb 2017 -> IgG, IgA, IgM - normal. Allpha 1AT - normal. Autoimmune - ANA, CCP, dsDNA, RF, ENA, SSA, SSB, scl-70, RNP - negative.    OV 07/01/2016  Chief Complaint  Patient presents with  . Follow-up    Follow up bronchoscopy.  pt c/o R sided back pain X3 days, nonprod cough.      Idiopathic bronchiectasis. Here to review results. Underwent bronchoscopy with lavage 06/21/2016 that showed acute neutrophils 91%. Culture was negative. AFB smear is negative fungal smear was negative. AFB culture is pending. Bronchoscopy showed a lot of mucus. She says that when she lies on she has significant cough and mucus. She feels that after bronchoscopy she improved because of the removal of mucus but shortly after this for the last few days she's having right infrascapular pleuritic and  off chest tightness that is consistent with previous exacerbation and she feels she needs an antibiotic and prednisone course. She is compliant with Spiriva and Brio at this point. Overall even at baseline she admits to copious white mucus.   OV 09/16/2016  Chief Complaint  Patient presents with  . Follow-up    Pt states that she does have some lung congestion, little cough and wheezing with SOB. Pt states that she is trying to learn when to "rest" and not overdo herself. Needs refills Breo and Spiriva Resp.    Follow-up idiopathic bronchiectasis. Last visit mid July 2017. After that called in 09/04/2016 with symptoms of bronchiectasis exacerbation. Called in Z-Pak and prednisone. She is back to baseline. At this point in time she tells me that she does not have daily mucus other than mild amounts. She does admit that she is noncompliant with physician with chest physical therapy. She recollects that one time she position herself  in an inversion table for back therapy and this cleared of the mucus and she felt much better. She admits that she needs to do a better job with positional therapy. She is up-to-date with flu shot. She continues on Spiriva and Brio triple inhaler therapy. There are no new issues.   OV 04/24/2017  Chief Complaint  Patient presents with  . Acute Visit    Pt c/o hoarseness, prod cough with green mucus, chest tightness x 2 days. Pt denies f/c/s and increase in SOB.   Acute visit for this patient with idiopathic bronchiectasis.  December 2017 she saw Dr. Baltazar Apo with an acute exacerbation. After that she went back to baseline. She does not use chest physical therapy but she uses inhaler therapy only. Now for the last 2 days she's having increased cough with chest tightness and green mucous. Baseline is clear yellow mucus. Low-grade fever present in the office. No wheezing or increased shortness of breath. Significant hoarseness of voice present and some postnasal drip  present. She feels she is in bronchiectasis exacerbation. There are no other issues. It is noted that in July 2017 bronchoscopy did not reveal any organisms on lavage     has a past medical history of Colon polyp; Shingles (1/09 and 07/2010); and Unspecified vitamin D deficiency (02/2011 and 2016).   reports that she quit smoking about 36 years ago. Her smoking use included Cigarettes. She has a 48.00 pack-year smoking history. She has never used smokeless tobacco.  Past Surgical History:  Procedure Laterality Date  . SHOULDER SURGERY  03/2011   for frozen shoulder, and cyst removal (Dr. Percell Miller)  . TONSILLECTOMY  child  . VIDEO BRONCHOSCOPY Bilateral 06/21/2016   Procedure: VIDEO BRONCHOSCOPY WITHOUT FLUORO;  Surgeon: Brand Males, MD;  Location: [facility];  Service: Cardiopulmonary;  Laterality: Bilateral;    No Known Allergies  Immunization History  Administered Date(s) Administered  . Influenza, High Dose Seasonal PF 01/26/2014, 10/19/2014, 08/30/2015, 08/08/2016  . Pneumococcal Conjugate-13 12/25/2015  . Pneumococcal Polysaccharide-23 12/30/2016    Family History  Problem Relation Age of Onset  . Stroke Mother   . Diabetes Mother   . Stroke Sister 53       brainstem stroke  . Diabetes Sister   . Seizures Sister   . Ovarian cancer Sister 31       metastatic to lung, liver  . Cancer Sister      Current Outpatient Prescriptions:  .  Albuterol Sulfate (PROAIR RESPICLICK) 161 (90 Base) MCG/ACT AEPB, Inhale 2 puffs into the lungs every 6 (six) hours. Prn wheezing or shortness of breath, Disp: 1 each, Rfl: 0 .  aspirin EC 81 MG tablet, Take 81 mg by mouth daily., Disp: , Rfl:  .  atorvastatin (LIPITOR) 20 MG tablet, Take 1 tablet (20 mg total) by mouth daily., Disp: 90 tablet, Rfl: 1 .  Calcium-Magnesium-Vitamin D (CALCIUM 500 PO), Take 1 tablet by mouth daily., Disp: , Rfl:  .  Cholecalciferol (VITAMIN D) 2000 UNITS tablet, Take 2,000 Units by mouth daily., Disp: , Rfl:   .  fish oil-omega-3 fatty acids 1000 MG capsule, Take 3 g by mouth daily. , Disp: , Rfl:  .  Fluticasone Furoate-Vilanterol (BREO ELLIPTA) 100-25 MCG/INH AEPB, Inhale 1 puff into the lungs daily., Disp: 60 each, Rfl: 0 .  Multiple Vitamins-Minerals (MULTIVITAMIN WITH MINERALS) tablet, Take 1 tablet by mouth daily., Disp: , Rfl:  .  NITROSTAT 0.4 MG SL tablet, Reported on 12/25/2015, Disp: , Rfl: 12 .  Respiratory Therapy Supplies (FLUTTER) DEVI, Use as directed, Disp: 1 each, Rfl: 0 .  sodium chloride HYPERTONIC 3 % nebulizer solution, Take by nebulization 2 (two) times daily., Disp: 750 mL, Rfl: 11 .  Tiotropium Bromide Monohydrate (SPIRIVA RESPIMAT) 2.5 MCG/ACT AERS, Inhale 2 puffs into the lungs 2 (two) times daily., Disp: 1 Inhaler, Rfl: 5 .  VENTOLIN HFA 108 (90 Base) MCG/ACT inhaler, INHALE 2 PUFFS INTO THE LUNGS EVERY 6 HOURS AS NEEDED FOR WHEEZING OR SHORTNESS OF BREATH., Disp: 18 Inhaler, Rfl: 3    Review of Systems     Objective:   Physical Exam  Constitutional: She is oriented to person, place, and time. She appears well-developed and well-nourished. No distress.  Looks a little more tired than usual  HENT:  Head: Normocephalic and atraumatic.  Right Ear: External ear normal.  Left Ear: External ear normal.  Mouth/Throat: Oropharynx is clear and moist. No oropharyngeal exudate.  Eyes: Conjunctivae and EOM are normal. Pupils are equal, round, and reactive to light. Right eye exhibits no discharge. Left eye exhibits no discharge. No scleral icterus.  Neck: Normal range of motion. Neck supple. No JVD present. No tracheal deviation present. No thyromegaly present.  Cardiovascular: Normal rate, regular rhythm, normal heart sounds and intact distal pulses.  Exam reveals no gallop and no friction rub.   No murmur heard. Pulmonary/Chest: Effort normal and breath sounds normal. No respiratory distress. She has no wheezes. She has no rales. She exhibits no tenderness.  Coarse breath  sounds  Abdominal: Soft. Bowel sounds are normal. She exhibits no distension and no mass. There is no tenderness. There is no rebound and no guarding.  Musculoskeletal: Normal range of motion. She exhibits no edema or tenderness.  Lymphadenopathy:    She has no cervical adenopathy.  Neurological: She is alert and oriented to person, place, and time. She has normal reflexes. No cranial nerve deficit. She exhibits normal muscle tone. Coordination normal.  Skin: Skin is warm and dry. No rash noted. She is not diaphoretic. No erythema. No pallor.  Psychiatric: She has a normal mood and affect. Her behavior is normal. Judgment and thought content normal.  Vitals reviewed.  Vitals:   04/24/17 1334  BP: 130/62  Pulse: 91  Temp: 99.6 F (37.6 C)  TempSrc: Oral  SpO2: 96%  Weight: 144 lb 6.4 oz (65.5 kg)  Height: 5\' 9"  (1.753 m)    Estimated body mass index is 21.32 kg/m as calculated from the following:   Height as of this encounter: 5\' 9"  (1.753 m).   Weight as of this encounter: 144 lb 6.4 oz (65.5 kg).     Assessment:       ICD-9-CM ICD-10-CM   1. Bronchiectasis with (acute) exacerbation (HCC) 494.1 J47.1        Plan:     Bronchiectasis with (acute) exacerbation (HCC) Give sputum for gram stain and culture  Take prednisone 40 mg daily x 2 days, then 20mg  daily x 2 days, then 10mg  daily x 2 days, then 5mg  daily x 2 days and stop   take levaquin 500mg  once daily  X 7 days  Continue inhalers as before  Go to ER if worse  Followup [facility] call with sputum results ER if worse Otherwise as per prior schedule     Dr. Brand Males, M.D., [facility].C.P Pulmonary and Critical Care Medicine Staff Physician [facility] Pager: [phone], If no answer or between  15:00h - 7:00h: call  [phone]  [phone]  04/24/2017 1:55 PM

## 2017-05-01 DIAGNOSIS — H25811 Combined forms of age-related cataract, right eye: Secondary | ICD-10-CM | POA: Diagnosis not present

## 2017-05-01 DIAGNOSIS — H2511 Age-related nuclear cataract, right eye: Secondary | ICD-10-CM | POA: Diagnosis not present

## 2017-05-16 ENCOUNTER — Encounter: Payer: Self-pay | Admitting: Pulmonary Disease

## 2017-05-16 ENCOUNTER — Ambulatory Visit (INDEPENDENT_AMBULATORY_CARE_PROVIDER_SITE_OTHER)
Admission: RE | Admit: 2017-05-16 | Discharge: 2017-05-16 | Disposition: A | Discharge status: Home / Self Care | Payer: Medicare Other | Source: Ambulatory Visit | Attending: Pulmonary Disease | Admitting: Pulmonary Disease

## 2017-05-16 ENCOUNTER — Ambulatory Visit (INDEPENDENT_AMBULATORY_CARE_PROVIDER_SITE_OTHER): Payer: Medicare Other | Admitting: Pulmonary Disease

## 2017-05-16 ENCOUNTER — Other Ambulatory Visit (INDEPENDENT_AMBULATORY_CARE_PROVIDER_SITE_OTHER): Payer: Medicare Other

## 2017-05-16 ENCOUNTER — Telehealth: Payer: Self-pay | Admitting: Internal Medicine

## 2017-05-16 ENCOUNTER — Other Ambulatory Visit: Payer: Medicare Other

## 2017-05-16 VITALS — BP 114/68 | HR 78 | Temp 99.2°F | Ht 69.0 in | Wt 144.0 lb

## 2017-05-16 DIAGNOSIS — J449 Chronic obstructive pulmonary disease, unspecified: Secondary | ICD-10-CM | POA: Diagnosis not present

## 2017-05-16 DIAGNOSIS — J471 Bronchiectasis with (acute) exacerbation: Secondary | ICD-10-CM

## 2017-05-16 DIAGNOSIS — R05 Cough: Secondary | ICD-10-CM | POA: Diagnosis not present

## 2017-05-16 DIAGNOSIS — R0602 Shortness of breath: Secondary | ICD-10-CM | POA: Diagnosis not present

## 2017-05-16 LAB — CBC WITH DIFFERENTIAL/PLATELET
Basophils Absolute: 0 10*3/uL (ref 0.0–0.1)
Basophils Relative: 0.2 % (ref 0.0–3.0)
EOS ABS: 0 10*3/uL (ref 0.0–0.7)
EOS PCT: 0.1 % (ref 0.0–5.0)
HCT: 39.1 % (ref 36.0–46.0)
Hemoglobin: 12.8 g/dL (ref 12.0–15.0)
LYMPHS ABS: 1.4 10*3/uL (ref 0.7–4.0)
Lymphocytes Relative: 7.3 % — ABNORMAL LOW (ref 12.0–46.0)
MCHC: 32.8 g/dL (ref 30.0–36.0)
MCV: 88.4 fl (ref 78.0–100.0)
MONO ABS: 1.6 10*3/uL — AB (ref 0.1–1.0)
Monocytes Relative: 8.6 % (ref 3.0–12.0)
NEUTROS PCT: 83.8 % — AB (ref 43.0–77.0)
Neutro Abs: 15.7 10*3/uL — ABNORMAL HIGH (ref 1.4–7.7)
Platelets: 327 10*3/uL (ref 150.0–400.0)
RBC: 4.42 Mil/uL (ref 3.87–5.11)
RDW: 13.1 % (ref 11.5–15.5)

## 2017-05-16 MED ORDER — LEVOFLOXACIN 500 MG PO TABS: 500.0000 mg | ORAL_TABLET | Freq: Every day | ORAL | 0 refills | Status: DC

## 2017-05-16 NOTE — Assessment & Plan Note (Signed)
Continue Breo and Spiriva 

## 2017-05-16 NOTE — Patient Instructions (Addendum)
Chest x-ray and blood work today. Levaquin 500 milligrams daily for 10 days- you may need a longer duration, check back with [place] When you finish this  Use Robitussin as needed for cough 5 ML thrice daily

## 2017-05-16 NOTE — Addendum Note (Signed)
Addended by: Valerie Salts on: 05/16/2017 11:43 AM   Modules accepted: Orders

## 2017-05-16 NOTE — Progress Notes (Signed)
   Subjective:    Patient ID: Molly Ross, female    DOB: [birth year], 76 y.o.   MRN: [id number]  HPI   76 year old heavy ex-smoker with COPD and bronchiectasis  Chief Complaint  Patient presents with  . Acute Visit    MR pt. Has had a cough, chills, increased SOB since last visit on 5/10. Took a round of levaquin which didn't help.    She was seen 3 weeks ago in the office for cough and sputum production and given Levaquin for 7 days with a short course of prednisone. She felt better for a few days but then symptoms came right back. 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 Post broncho-dilator FEV1 1.36 L/51% and a ratio 60, total lung capacity 5.2 L/90%, DLCO 16/51%   Review of Systems neg for any significant sore throat, dysphagia, itching, sneezing, nasal congestion or excess/ purulent secretions, fever, chills, sweats, unintended wt loss, pleuritic or exertional cp, hempoptysis, orthopnea pnd or change in chronic leg swelling.   Also denies presyncope, palpitations, heartburn, abdominal pain, nausea, vomiting, diarrhea or change in bowel or urinary habits, dysuria,hematuria, rash, arthralgias, visual complaints, headache, numbness weakness or ataxia.     Objective:   Physical Exam   Gen. Pleasant, well-nourished, in no distress ENT - no thrush, no post nasal drip Neck: No JVD, no thyromegaly, no carotid bruits Lungs: no use of accessory muscles, no dullness to percussion, rt axillary rales, no rhonchi  Cardiovascular: Rhythm regular, heart sounds  normal, no murmurs or gallops, no peripheral  edema Musculoskeletal: No deformities, no cyanosis or clubbing         Assessment & Plan:

## 2017-05-16 NOTE — Telephone Encounter (Signed)
disreguard message, pt was downstairs leaving a sputum sample and then came upstairs to be seen (acute ov) phone was breaking up and I could not understand what she was saying and then we got disconnected.Hillery Hunter

## 2017-05-16 NOTE — Addendum Note (Signed)
Addended by: Valerie Salts on: 05/16/2017 11:27 AM   Modules accepted: Orders

## 2017-05-16 NOTE — Assessment & Plan Note (Addendum)
We'll treat as flare, may need longer duration of antibiotics  Chest x-ray and blood work today. Levaquin 500 milligrams daily for 10 days- you may need a longer duration, check back with [place] When you finish this  Use Robitussin as needed for cough 5 ML thrice daily  Await sputum culture Repeat sputum for AFB for MAC

## 2017-05-20 LAB — RESPIRATORY CULTURE OR RESPIRATORY AND SPUTUM CULTURE

## 2017-05-23 ENCOUNTER — Other Ambulatory Visit: Payer: Self-pay | Admitting: Pulmonary Disease

## 2017-05-26 ENCOUNTER — Telehealth: Payer: Self-pay | Admitting: Internal Medicine

## 2017-05-26 NOTE — Telephone Encounter (Signed)
Followed up with patient who states she is still coughing up greenish yellow mucus (not as much as previously) and had some chest discomfort over weekend that had resolved. She picked up Levaquin today to help with remaining symptoms, but is doing better overall.   Will route to RA.

## 2017-05-26 NOTE — Telephone Encounter (Signed)
Spoke with pt,aware of recs.  Nothing further needed.  

## 2017-05-26 NOTE — Telephone Encounter (Signed)
I called and spoke with the pt and notified of recs per MR  She verbalized understanding  She states that she just went to pharm and picked up a refill on her levaquin She states that she is overall feeling much improved, however, she is still coughing up moderate amounts of green to yellow sputum  RA- do you have any further recs? I told her I would be calling her back if you did  Please advise, thanks!

## 2017-05-26 NOTE — Telephone Encounter (Signed)
Culture groew psuedomona and h flu. Given levaquin by Dr Elsworth Soho 05/16/17 for 10 days. Please see how she is doing and relay back to Dr Elsworth Soho for next step. I am out of town for next week and I just saw t his result  Dr. Brand Males, M.D., [facility].C.P Pulmonary and Critical Care Medicine Staff Physician [place] [facility] Pager: [phone], If no answer or between  15:00h - 7:00h: call [phone]  05/26/2017 1:11 PM

## 2017-05-26 NOTE — Telephone Encounter (Signed)
No new reccs. Start levaquin today and finish the course as ordered by RA. Follow up with RA.  Molly Ross

## 2017-05-27 NOTE — Telephone Encounter (Signed)
Pl make levaquin x 14 days total She is mR pt & further FU with him

## 2017-05-29 ENCOUNTER — Telehealth: Payer: Self-pay | Admitting: Internal Medicine

## 2017-05-29 MED ORDER — LEVOFLOXACIN 500 MG PO TABS: 500.0000 mg | ORAL_TABLET | Freq: Every day | ORAL | 0 refills | Status: DC

## 2017-05-29 NOTE — Telephone Encounter (Signed)
See previous note  Spoke with the pt and have sent in 4 more days of levaquin  Nothing further needed

## 2017-05-29 NOTE — Telephone Encounter (Signed)
lmtcb for pt.  

## 2017-06-06 ENCOUNTER — Telehealth: Payer: Self-pay | Admitting: Internal Medicine

## 2017-06-06 NOTE — Telephone Encounter (Signed)
RA there are no openings at all in clinic next week. Please advise. Thanks.

## 2017-06-06 NOTE — Telephone Encounter (Signed)
Pt aware that we will contact her Monday morning for an appt.  Pt advised to go to UC or ED over the weekend if symptoms worsen.  Will hold in triage to call pt with appt 06/09/17 --see MW rec's below

## 2017-06-06 NOTE — Telephone Encounter (Signed)
We can sort out who she needs to see next week - let's call er on 6/25 and see if she feels better and if so give 5 more days Levaquin and if not work her in somewhere before more abx

## 2017-06-06 NOTE — Telephone Encounter (Signed)
This is MR patient Sputum culture showed sensitive Pseudomonas and Haemophilus She will need follow-up office visit on Monday to decide about more antibiotics and repeat sputum culture Finish 2 more days on Levaquin

## 2017-06-06 NOTE — Telephone Encounter (Signed)
Spoke with pt. She is not feeling better since seeing RA on 05/16/17. Reports cough, chest tightness, wheezing and SOB. Cough is producing green mucus. Denies fever, chills or sweats. She has 2 days left of Levaquin 500mg . Pt asks that we send her message to RA to address.  RA - please advise. Thanks.

## 2017-06-06 NOTE — Telephone Encounter (Signed)
Sending to DOD as this was not addressed again before RA left.   MW please advise on recs. Thanks

## 2017-06-09 MED ORDER — LEVOFLOXACIN 500 MG PO TABS: 500.0000 mg | ORAL_TABLET | Freq: Every day | ORAL | 0 refills | Status: DC

## 2017-06-09 NOTE — Telephone Encounter (Signed)
Spoke with pt, who states she is feeling better.  Rx for Levaquin has been sent to preferred pharmacy per MW for 5 more days. Pt is aware and voiced her understanding. Nothing further needed.

## 2017-06-25 ENCOUNTER — Other Ambulatory Visit: Payer: Self-pay | Admitting: Family Medicine

## 2017-06-25 DIAGNOSIS — I700 Atherosclerosis of aorta: Secondary | ICD-10-CM

## 2017-07-04 LAB — AFB CULTURE WITH SMEAR (NOT AT ARMC)

## 2017-08-07 ENCOUNTER — Encounter: Payer: Self-pay | Admitting: *Deleted

## 2017-08-25 ENCOUNTER — Encounter: Payer: Medicare Other | Admitting: Family Medicine

## 2017-09-01 ENCOUNTER — Ambulatory Visit (INDEPENDENT_AMBULATORY_CARE_PROVIDER_SITE_OTHER): Payer: Medicare Other | Admitting: Family Medicine

## 2017-09-01 ENCOUNTER — Encounter: Payer: Self-pay | Admitting: Family Medicine

## 2017-09-01 VITALS — BP 120/70 | HR 76 | Ht 69.0 in | Wt 142.6 lb

## 2017-09-01 DIAGNOSIS — I700 Atherosclerosis of aorta: Secondary | ICD-10-CM | POA: Diagnosis not present

## 2017-09-01 DIAGNOSIS — Z5181 Encounter for therapeutic drug level monitoring: Secondary | ICD-10-CM

## 2017-09-01 DIAGNOSIS — J479 Bronchiectasis, uncomplicated: Secondary | ICD-10-CM

## 2017-09-01 DIAGNOSIS — J449 Chronic obstructive pulmonary disease, unspecified: Secondary | ICD-10-CM

## 2017-09-01 DIAGNOSIS — I2510 Atherosclerotic heart disease of native coronary artery without angina pectoris: Secondary | ICD-10-CM

## 2017-09-01 DIAGNOSIS — R300 Dysuria: Secondary | ICD-10-CM | POA: Diagnosis not present

## 2017-09-01 LAB — POCT URINALYSIS DIP (PROADVANTAGE DEVICE)
BILIRUBIN UA: NEGATIVE
BILIRUBIN UA: NEGATIVE mg/dL
Blood, UA: NEGATIVE
GLUCOSE UA: NEGATIVE mg/dL
LEUKOCYTES UA: NEGATIVE
Nitrite, UA: NEGATIVE
Protein Ur, POC: NEGATIVE mg/dL
SPECIFIC GRAVITY, URINE: 1.015
Urobilinogen, Ur: NEGATIVE
pH, UA: 7.5 (ref 5.0–8.0)

## 2017-09-01 LAB — COMPREHENSIVE METABOLIC PANEL
AG RATIO: 1.6 (calc) (ref 1.0–2.5)
ALT: 10 U/L (ref 6–29)
AST: 18 U/L (ref 10–35)
Albumin: 4.4 g/dL (ref 3.6–5.1)
Alkaline phosphatase (APISO): 82 U/L (ref 33–130)
BUN / CREAT RATIO: 21 (calc) (ref 6–22)
BUN: 12 mg/dL (ref 7–25)
CALCIUM: 9.8 mg/dL (ref 8.6–10.4)
CO2: 30 mmol/L (ref 20–32)
Chloride: 104 mmol/L (ref 98–110)
Creat: 0.56 mg/dL — ABNORMAL LOW (ref 0.60–0.93)
GLUCOSE: 87 mg/dL (ref 65–99)
Globulin: 2.7 g/dL (calc) (ref 1.9–3.7)
Potassium: 4.9 mmol/L (ref 3.5–5.3)
SODIUM: 142 mmol/L (ref 135–146)
TOTAL PROTEIN: 7.1 g/dL (ref 6.1–8.1)
Total Bilirubin: 0.9 mg/dL (ref 0.2–1.2)

## 2017-09-01 LAB — LIPID PANEL
CHOLESTEROL: 158 mg/dL (ref <200)
HDL: 95 mg/dL (ref >50)
LDL Cholesterol (Calc): 51 mg/dL (calc)
Non-HDL Cholesterol (Calc): 63 mg/dL (calc) (ref <130)
TRIGLYCERIDES: 42 mg/dL (ref <150)
Total CHOL/HDL Ratio: 1.7 (calc) (ref <5.0)

## 2017-09-01 NOTE — Patient Instructions (Signed)
Please remember to schedule your bone density test. Remember to go back to the pharmacy for the tetanus (TdaP). Get the shingrix when it is available.  I recommend getting the new shingles vaccine (Shingrix). You will need to check with your insurance to see if it is covered, and if covered by Medicare Part D, you need to get from the pharmacy rather than our office.  It is a series of 2 injections, spaced 2 months apart.

## 2017-09-01 NOTE — Progress Notes (Signed)
Chief Complaint  Patient presents with  . Anxiety    fasting med check and recheck on lipids.    Patient presents for follow-up.  She was started at 83m (1/2 of 149mtablet) of Paxil at her physical in January, 2018 for anxiety.  She reports she only took it for 1 week (at 51m71m stopped due to severe nausea.   (we only learned of this on 2/5).  She declined offer for Zofran, to help her get through some of the initial nausea. At her last visit 6 months ago, she was doing quite well, but her sister MalCinda Quests still in rehab, not at home. At that time she wasn't having any problems with anxiety--not needing alprazolam, sleeping fine.   MalCinda Quest now back living at home. She has help in the home 8 hours/day; she cut back working to just 3 days/week (2 weeks ago) in order to help better manage the care of Malinda.   CAD: She was noted to have coronary calcification in 2 vessels on CT scan in 2016.She was referred to Dr. TilWynonia Lawmand had a myocardial perfusion scan. She exercised 5 minutes 30 seconds into Bruce stage II with no EKG changes. She had no evidence of ischemia with an ejection fraction of 76%. She has not had any recurrent chest pain since then. She has been compliant with taking aspirin and statin. She had some tolerability issues with the 28m71morvastatin (discomfort in her legs). These symptoms resolved after changing to 28mg44mry other day (rather than cutting dose in 1/2). She later started taking 1/2 tablet daily and subsequently changed to a 20mg 53met. She denies any side effects.   I believe she had been off the statin for a few months prior to these last labs: Lab Results  Component Value Date   CHOL 165 12/30/2016   HDL 79 12/30/2016   LDLCALC 77 12/30/2016   TRIG 47 12/30/2016   CHOLHDL 2.1 12/30/2016  She is taking it daily now, and denies any side effects. Denies any exertional symptoms. Doesn't follow regularly with Dr. TilleyWynonia Lawmaning scheduled).  Emphysema and  bronchiectasis: Under the care of pulmonologist, last seen in June. She had an exacerbation in June, requiring course of Levaquin (once also in May, and course of prednisone).  Doing well since that episode "cleared up". She continues on Breo, Spiriva Respimat, flutter valve and hypertonic saline nebulizers. She feels like she is back to baseline.   Vitamin D deficiency: s/p 12 weeks of rx replacement in February 2016. She continues to take MVI and vitamin D daily.  Vitamin D level 12/2015 was 39, and 44 in 12/2016. Currently she is taking MVI and Ca+D, and 2000 IU vitamin D. She reports no changes in her supplements.  She has not yet gotten her Tdap yet from the pharmacy.  She had gone to get it July 4th but they were too busy. She was told not to get the flu shot and Tetanus the same day, and to return in a month.  She plans to go back soon.  She had bilateral cataract surgery since her last visit, and her vision is much better.  She is having some urinary frequency and some back pain.  She has been drinking cranberry juice and symptoms resolved.  No problems in the last week. She left a urine for it to be checked.  She did not schedule her DEXA yet.  PMH, PSH, SH reviewed  Outpatient Encounter Prescriptions as of 09/01/2017  Medication  Sig Note  . aspirin EC 81 MG tablet Take 81 mg by mouth daily.   Marland Kitchen atorvastatin (LIPITOR) 20 MG tablet TAKE 1 TABLET (20 MG TOTAL) BY MOUTH DAILY.   . Calcium-Magnesium-Vitamin D (CALCIUM 500 PO) Take 1 tablet by mouth daily.   . Cholecalciferol (VITAMIN D) 2000 UNITS tablet Take 2,000 Units by mouth daily. 02/17/2017: Resumed taking  . fish oil-omega-3 fatty acids 1000 MG capsule Take 3 g by mouth daily.    . Fluticasone Furoate-Vilanterol (BREO ELLIPTA) 100-25 MCG/INH AEPB Inhale 1 puff into the lungs daily.   . Multiple Vitamins-Minerals (MULTIVITAMIN WITH MINERALS) tablet Take 1 tablet by mouth daily.   Marland Kitchen Respiratory Therapy Supplies (FLUTTER) DEVI Use as  directed   . sodium chloride HYPERTONIC 3 % nebulizer solution Take by nebulization 2 (two) times daily.   . Tiotropium Bromide Monohydrate (SPIRIVA RESPIMAT) 2.5 MCG/ACT AERS Inhale 2 puffs into the lungs 2 (two) times daily.   . VENTOLIN HFA 108 (90 Base) MCG/ACT inhaler INHALE 2 PUFFS INTO THE LUNGS EVERY 6 HOURS AS NEEDED FOR WHEEZING OR SHORTNESS OF BREATH.   . Albuterol Sulfate (PROAIR RESPICLICK) 782 (90 Base) MCG/ACT AEPB Inhale 2 puffs into the lungs every 6 (six) hours. Prn wheezing or shortness of breath (Patient not taking: Reported on 09/01/2017)   . NITROSTAT 0.4 MG SL tablet Reported on 12/25/2015   . [DISCONTINUED] levofloxacin (LEVAQUIN) 500 MG tablet Take 1 tablet (500 mg total) by mouth daily.    No facility-administered encounter medications on file as of 09/01/2017.    No Known Allergies  ROS: no fever, chills, headaches, dizziness, syncope, URI symptoms (recent respiratory illness resolved).  Denies chest pain, palpitations, nausea, vomiting, bowel changes, urinary complaints, bleeding, bruising, rash, depression, anxiety or other complaints.   PHYSICAL EXAM:  BP 120/70 (BP Location: Left Arm, Patient Position: Sitting, Cuff Size: Normal)   Pulse 76   Ht '5\' 9"'  (1.753 m)   Wt 142 lb 9.6 oz (64.7 kg)   BMI 21.06 kg/m   Wt Readings from Last 3 Encounters:  09/01/17 142 lb 9.6 oz (64.7 kg)  05/16/17 144 lb (65.3 kg)  04/24/17 144 lb 6.4 oz (65.5 kg)   Well developed, pleasant female, in good spirits HEENT: conjunctiva and sclera are clear, EOMI. OP is clear Neck: no lymphadenopathy, thyromegaly or mass.  No carotid bruit Heart: regular rate and rhythm, no murmur Lungs: some diminished air movement. Some squeaks/ronchi noted at right mid lung field, clear elsewhere.  No rales Abdomen: soft, nontender, no organomegaly or mass Extremities: no edema, normal pulses Psych: normal mood, affect, hygiene and grooming Neuro: alert an oriented, cranial nerves intact, normal  gait  ASSESSMENT/PLAN:  Coronary artery disease involving native coronary artery of native heart without angina pectoris - continue aspirin and statin. Due for labs. Asymptomatic - Plan: Comprehensive metabolic panel  Atherosclerosis of aorta (HCC)  Bronchiectasis without complication ([place]) - recent exacerbation; currently back at baseline. f/u with pulm as scheduled. She got flu shot at pharmacy  COPD, moderate ([place])  Medication monitoring encounter - Plan: Comprehensive metabolic panel, Lipid panel  Dysuria - recent urinary symptoms. Urine is normal, symptoms resolved - Plan: POCT Urinalysis DIP (Proadvantage Device)   c-met, lipids today  TdaP and Shingrix recommended--encouraged to get at pharmacy Got flu shot already DEXA recommended (esp due to prednisone use)--she plans to call to get it done in [place].   Please remember to schedule your bone density test. Remember to go back to the pharmacy  for the tetanus (TdaP). Get the shingrix when it is available.   F/u 6 months CPE/AWV, sooner prn

## 2017-09-03 DIAGNOSIS — K5900 Constipation, unspecified: Secondary | ICD-10-CM | POA: Diagnosis not present

## 2017-09-24 ENCOUNTER — Telehealth: Payer: Self-pay | Admitting: Internal Medicine

## 2017-09-24 MED ORDER — CIPROFLOXACIN HCL 750 MG PO TABS: 750.0000 mg | ORAL_TABLET | Freq: Two times a day (BID) | ORAL | 0 refills | Status: DC

## 2017-09-24 MED ORDER — PREDNISONE 10 MG PO TABS: ORAL_TABLET | ORAL | 0 refills | Status: DC

## 2017-09-24 NOTE — Telephone Encounter (Signed)
cipro 750 bid x 7 days  Prednisone 10 mg take  4 each am x 2 days,   2 each am x 2 days,  1 each am x 2 days and stop

## 2017-09-24 NOTE — Telephone Encounter (Signed)
It does you have to hit the tab all the way to the right "database" to get it so I went ahead and sent it x 10 day supply (I had said 7 but 10 is fine)

## 2017-09-24 NOTE — Telephone Encounter (Signed)
Pt is aware that Rx has been sent by MW and nothing more needed at this time.

## 2017-09-24 NOTE — Telephone Encounter (Signed)
Spoke with patient. She is aware of MW's recs. Will go ahead and go in the medications.   Nothing else needed at time of call.   MW, Cipro does not come in 750. Did you mean 250 or 500? Please advise.

## 2017-09-24 NOTE — Telephone Encounter (Signed)
Spoke with patient. She stated that her chest tightness started on Monday of this week. Over the past two days, her chest tightness has increased as well as her coughing. She is now coughing up green mucus. She does feel feverish. Increased SOB as well.   [facility] [address]   MW, please advise since MR is not in the office today. Thanks!

## 2017-09-30 ENCOUNTER — Other Ambulatory Visit: Payer: Self-pay | Admitting: Family Medicine

## 2017-09-30 DIAGNOSIS — I700 Atherosclerosis of aorta: Secondary | ICD-10-CM

## 2017-10-21 ENCOUNTER — Other Ambulatory Visit: Payer: Self-pay | Admitting: Family Medicine

## 2017-10-21 DIAGNOSIS — I700 Atherosclerosis of aorta: Secondary | ICD-10-CM

## 2017-10-31 ENCOUNTER — Encounter: Payer: Self-pay | Admitting: Family Medicine

## 2017-10-31 ENCOUNTER — Ambulatory Visit (INDEPENDENT_AMBULATORY_CARE_PROVIDER_SITE_OTHER): Payer: Medicare Other | Admitting: Family Medicine

## 2017-10-31 VITALS — BP 110/70 | HR 68 | Temp 98.3°F | Wt 142.8 lb

## 2017-10-31 DIAGNOSIS — B029 Zoster without complications: Secondary | ICD-10-CM

## 2017-10-31 MED ORDER — HYDROCODONE-ACETAMINOPHEN 5-325 MG PO TABS: 1.0000 | ORAL_TABLET | Freq: Four times a day (QID) | ORAL | 0 refills | Status: DC | PRN

## 2017-10-31 MED ORDER — VALACYCLOVIR HCL 1 G PO TABS: 1000.0000 mg | ORAL_TABLET | Freq: Three times a day (TID) | ORAL | 0 refills | Status: DC

## 2017-10-31 NOTE — Progress Notes (Signed)
   Subjective:    Patient ID: Molly Ross, female    DOB: 03/01/1941, 76 y.o.   MRN: [id number]  HPI Chief Complaint  Patient presents with  . possible shingles    rash on left buttock notice yesterday very painful   She is here with complaints of a 24 history of a rash that is burning on her left buttock. States pain was preceded with a "numb sensation" to that area the day before the rash appeared.   States she had shingles on the same side but on her left lower back and again on her right forehead in the distant past. States she has had shingles 3 times.   States she did not get the shingles vaccine and is regretful.   Denies fever, chills, generalized body aches, URI symptoms, chest pain, palpitations, shortness of breath, abdominal pain, N/V/D.   Reviewed allergies, medications, past medical, surgical,  and social history.   Review of Systems Pertinent positives and negatives in the history of present illness.     Objective:   Physical Exam BP 110/70   Pulse 68   Temp 98.3 F (36.8 C)   Wt 142 lb 12.8 oz (64.8 kg)   SpO2 94%   BMI 21.09 kg/m  Alert and oriented and in no acute distress. Vesicular rash with a erythematous base in a mostly linear fashion on left buttock. No secondary bacterial infection.      Assessment & Plan:  Herpes zoster without complication - Plan: valACYclovir (VALTREX) 1000 MG tablet, HYDROcodone-acetaminophen (NORCO) 5-325 MG tablet  Discussed management and treatment of shingles.  She is familiar with this condition since she has had a 2 other times in the past.  She will keep the area covered and avoid being around pregnant women, young children who have not yet received chickenpox vaccine or any immunocompromised people.  She will take Tylenol during the day and a prescription for Norco was given to take for severe pain.  Educated her on not exceeding daily recommended Tylenol dose.  Denies history of problematic PHN. she reports having a  scheduled appointment with Dr. Tomi Bamberger in 2 weeks.  She can follow-up with Dr. Tomi Bamberger or me for current shingles rash. Encouraged to get shingles vaccine after current rash has resolved.

## 2017-10-31 NOTE — Patient Instructions (Addendum)
Take the valacyclovir as prescribed 3 times daily.  Take Tylenol for pain and you may take the hydrocodone-Tylenol (Norco) for severe pain but do not drive, drink alcohol.  This medication is sedating.  Also, be aware of how much Tylenol you are taking and do not exceed 4,000 mg/day. Keep the area covered and avoid being around pregnant women and small children who have not been vaccinated for chickenpox. Follow-up as scheduled with Dr. Tomi Bamberger or if you the pain persists or any new symptoms arise you may come in to see me or Dr. Tomi Bamberger.      Shingles Shingles, which is also known as herpes zoster, is an infection that causes a painful skin rash and fluid-filled blisters. Shingles is not related to genital herpes, which is a sexually transmitted infection. Shingles only develops in people who:  Have had chickenpox.  Have received the chickenpox vaccine. (This is rare.)  What are the causes? Shingles is caused by varicella-zoster virus (VZV). This is the same virus that causes chickenpox. After exposure to VZV, the virus stays in the body in an inactive (dormant) state. Shingles develops if the virus reactivates. This can happen many years after the initial exposure to VZV. It is not known what causes this virus to reactivate. What increases the risk? People who have had chickenpox or received the chickenpox vaccine are at risk for shingles. Infection is more common in people who:  Are older than age 53.  Have a weakened defense (immune) system, such as those with HIV, AIDS, or cancer.  Are taking medicines that weaken the immune system, such as transplant medicines.  Are under great stress.  What are the signs or symptoms? Early symptoms of this condition include itching, tingling, and pain in an area on your skin. Pain may be described as burning, stabbing, or throbbing. A few days or weeks after symptoms start, a painful red rash appears, usually on one side of the body in a bandlike or  beltlike pattern. The rash eventually turns into fluid-filled blisters that break open, scab over, and dry up in about 2-3 weeks. At any time during the infection, you may also develop:  A fever.  Chills.  A headache.  An upset stomach.  How is this diagnosed? This condition is diagnosed with a skin exam. Sometimes, skin or fluid samples are taken from the blisters before a diagnosis is made. These samples are examined under a microscope or sent to a lab for testing. How is this treated? There is no specific cure for this condition. Your health care provider will probably prescribe medicines to help you manage pain, recover more quickly, and avoid long-term problems. Medicines may include:  Antiviral drugs.  Anti-inflammatory drugs.  Pain medicines.  If the area involved is on your face, you may be referred to a specialist, such as an eye doctor (ophthalmologist) or an ear, nose, and throat (ENT) doctor to help you avoid eye problems, chronic pain, or disability. Follow these instructions at home: Medicines  Take medicines only as directed by your health care provider.  Apply an anti-itch or numbing cream to the affected area as directed by your health care provider. Blister and Rash Care  Take a cool bath or apply cool compresses to the area of the rash or blisters as directed by your health care provider. This may help with pain and itching.  Keep your rash covered with a loose bandage (dressing). Wear loose-fitting clothing to help ease the pain of material rubbing  against the rash.  Keep your rash and blisters clean with mild soap and cool water or as directed by your health care provider.  Check your rash every day for signs of infection. These include redness, swelling, and pain that lasts or increases.  Do not pick your blisters.  Do not scratch your rash. General instructions  Rest as directed by your health care provider.  Keep all follow-up visits as directed  by your health care provider. This is important.  Until your blisters scab over, your infection can cause chickenpox in people who have never had it or been vaccinated against it. To prevent this from happening, avoid contact with other people, especially: ? Babies. ? Pregnant women. ? Children who have eczema. ? Elderly people who have transplants. ? People who have chronic illnesses, such as leukemia or AIDS. Contact a health care provider if:  Your pain is not relieved with prescribed medicines.  Your pain does not get better after the rash heals.  Your rash looks infected. Signs of infection include redness, swelling, and pain that lasts or increases. Get help right away if:  The rash is on your face or nose.  You have facial pain, pain around your eye area, or loss of feeling on one side of your face.  You have ear pain or you have ringing in your ear.  You have loss of taste.  Your condition gets worse. This information is not intended to replace advice given to you by your health care provider. Make sure you discuss any questions you have with your health care provider. Document Released: 12/02/2005 Document Revised: 07/28/2016 Document Reviewed: 10/13/2014 Elsevier Interactive Patient Education  2017 [employer].

## 2017-11-11 DIAGNOSIS — D126 Benign neoplasm of colon, unspecified: Secondary | ICD-10-CM | POA: Diagnosis not present

## 2017-11-11 DIAGNOSIS — K648 Other hemorrhoids: Secondary | ICD-10-CM | POA: Diagnosis not present

## 2017-11-11 DIAGNOSIS — K5730 Diverticulosis of large intestine without perforation or abscess without bleeding: Secondary | ICD-10-CM | POA: Diagnosis not present

## 2017-11-11 DIAGNOSIS — Z1211 Encounter for screening for malignant neoplasm of colon: Secondary | ICD-10-CM | POA: Diagnosis not present

## 2017-11-11 LAB — HM COLONOSCOPY

## 2017-11-13 DIAGNOSIS — D126 Benign neoplasm of colon, unspecified: Secondary | ICD-10-CM | POA: Diagnosis not present

## 2017-11-13 DIAGNOSIS — Z1211 Encounter for screening for malignant neoplasm of colon: Secondary | ICD-10-CM | POA: Diagnosis not present

## 2017-11-18 NOTE — Progress Notes (Signed)
  Patient presents for 2 week f/u on shingles, which affected her left buttock. She was treated with Valtrex, and given rx for Norco for severe pain.  She reports the rash resolved very quickly after starting the medication--felt much better just a few days later.  Denies any residual burning or discomfort.  She has had prior episode of shingles--a little higher on the hip. She only needed hydrocodone the first day.  The pain radiated down the leg only for the first few days.  We had discussed recently (when she was at a visit with her sister) that she could delay the Shingrix vaccination--no rush, since antibody levels are currently high from recent infection. Okay to wait 6 months before getting vaccine (but not need to wait, if she prefers to do it right away; not currently readily available at the pharmacy).  She had her colonoscopy last [facility]. She reports there were multiple polyps removed, hasn't heard pathology results yet.  PMH, PSH, SH reviewed  Outpatient Encounter Medications as of 11/19/2017  Medication Sig  . aspirin EC 81 MG tablet Take 81 mg by mouth daily.  Marland Kitchen atorvastatin (LIPITOR) 20 MG tablet TAKE 1 TABLET BY MOUTH EVERY DAY  . Calcium-Magnesium-Vitamin D (CALCIUM 500 PO) Take 1 tablet by mouth daily.  . fish oil-omega-3 fatty acids 1000 MG capsule Take 3 g by mouth daily.   . Fluticasone Furoate-Vilanterol (BREO ELLIPTA) 100-25 MCG/INH AEPB Inhale 1 puff into the lungs daily.  . Multiple Vitamins-Minerals (MULTIVITAMIN WITH MINERALS) tablet Take 1 tablet by mouth daily.  Marland Kitchen Respiratory Therapy Supplies (FLUTTER) DEVI Use as directed  . Albuterol Sulfate (PROAIR RESPICLICK) 001 (90 Base) MCG/ACT AEPB Inhale 2 puffs into the lungs every 6 (six) hours. Prn wheezing or shortness of breath (Patient not taking: Reported on 11/19/2017)  . sodium chloride HYPERTONIC 3 % nebulizer solution Take by nebulization 2 (two) times daily. (Patient not taking: Reported on 11/19/2017)  .  Tiotropium Bromide Monohydrate (SPIRIVA RESPIMAT) 2.5 MCG/ACT AERS Inhale 2 puffs into the lungs 2 (two) times daily. (Patient not taking: Reported on 11/19/2017)  . VENTOLIN HFA 108 (90 Base) MCG/ACT inhaler INHALE 2 PUFFS INTO THE LUNGS EVERY 6 HOURS AS NEEDED FOR WHEEZING OR SHORTNESS OF BREATH. (Patient not taking: Reported on 11/19/2017)  . [DISCONTINUED] HYDROcodone-acetaminophen (NORCO) 5-325 MG tablet Take 1 tablet every 6 (six) hours as needed by mouth for moderate pain.  . [DISCONTINUED] valACYclovir (VALTREX) 1000 MG tablet Take 1 tablet (1,000 mg total) 3 (three) times daily by mouth.   No facility-administered encounter medications on file as of 11/19/2017.    No Known Allergies  ROS: no fever, chills, URI symptoms, headache, GI complaints. Rash and pain resolved. Moods are good.  PHYSICAL EXAM: BP 110/60   Pulse 64   Ht 5\' 9"  (1.753 m)   Wt 139 lb 9.6 oz (63.3 kg)   BMI 20.62 kg/m   Well appearing, pleasant female, in good spirits. Left buttock cheek, middle area, there is 2.5 x 1.5cm area that is rough, scaling/healing.  No vesicles, crusting, or other abnormality.   ASSESSMENT/PLAN:  Herpes zoster without complication - responded nicely to Valtrex; rash is healing, no further pain   She is scheduled for AWV 01/05/18  Please schedule your bone density--order is good until 02/2018. Get the Shingrix vaccine when available sometime in the next 3-6 months.

## 2017-11-19 ENCOUNTER — Encounter: Payer: Self-pay | Admitting: Family Medicine

## 2017-11-19 ENCOUNTER — Ambulatory Visit (INDEPENDENT_AMBULATORY_CARE_PROVIDER_SITE_OTHER): Payer: Medicare Other | Admitting: Family Medicine

## 2017-11-19 VITALS — BP 110/60 | HR 64 | Ht 69.0 in | Wt 139.6 lb

## 2017-11-19 DIAGNOSIS — B029 Zoster without complications: Secondary | ICD-10-CM

## 2017-11-19 NOTE — Patient Instructions (Signed)
Please schedule your bone density--order is good until 02/2018. Get the Shingrix vaccine when available sometime in the next 3-6 months.

## 2017-11-27 ENCOUNTER — Telehealth: Payer: Self-pay | Admitting: Internal Medicine

## 2017-11-27 MED ORDER — PREDNISONE 10 MG PO TABS: ORAL_TABLET | ORAL | 0 refills | Status: DC

## 2017-11-27 MED ORDER — LEVOFLOXACIN 500 MG PO TABS: 500.0000 mg | ORAL_TABLET | Freq: Every day | ORAL | 0 refills | Status: DC

## 2017-11-27 NOTE — Telephone Encounter (Signed)
Pt c/o R sided chest pain, prod cough with green mucus X1 day- states this feels like a bronchiectasis flare.  Denies fever, chills, sinus congestion. Pt has been taking her maintenance meds to help with s/s- requesting further recs.    [facility] [address]    MR please advise.  Thanks.

## 2017-11-27 NOTE — Telephone Encounter (Signed)
Ae-bronchiectasis   take levaquin 500mg  once daily  X 5 days  Please take prednisone 40 mg x1 day, then 30 mg x1 day, then 20 mg x1 day, then 10 mg x1 day, and then 5 mg x1 day and stop

## 2017-11-27 NOTE — Telephone Encounter (Signed)
Called pt and advised message from the provider. Pt understood and verbalized understanding. Nothing further is needed.   Spoke with pt and advised rx sent to pharmacy   

## 2017-12-15 ENCOUNTER — Telehealth: Payer: Self-pay | Admitting: Internal Medicine

## 2017-12-15 NOTE — Telephone Encounter (Signed)
Left voice mail on machine for patient to return phone call back regarding message. X1

## 2017-12-16 DIAGNOSIS — Z923 Personal history of irradiation: Secondary | ICD-10-CM | POA: Insufficient documentation

## 2017-12-16 HISTORY — PX: BREAST LUMPECTOMY: SHX2

## 2017-12-16 HISTORY — DX: Personal history of irradiation: Z92.3

## 2017-12-17 NOTE — Telephone Encounter (Signed)
lmtcb x2 for pt to follow up.

## 2017-12-18 MED ORDER — DOXYCYCLINE HYCLATE 100 MG PO TABS: 100.0000 mg | ORAL_TABLET | Freq: Two times a day (BID) | ORAL | 0 refills | Status: DC

## 2017-12-18 NOTE — Telephone Encounter (Signed)
lmtcb x3 for pt. 

## 2017-12-18 NOTE — Telephone Encounter (Signed)
ATC pt, no answer. Left message for pt to call back.  

## 2017-12-18 NOTE — Telephone Encounter (Signed)
Take doxycycline 100mg  po twice daily x 7 days; take after meals and avoid sunlight

## 2017-12-18 NOTE — Telephone Encounter (Signed)
lmtcb x1 for pt.  Rx for doxy has been sent to verified pharmacy. Will leave message in triage to assure that Rx was picked up.

## 2017-12-18 NOTE — Telephone Encounter (Signed)
Spoke with pt, she states she thinks she has a sinus infection. She is coughing up bright yellow mucus. She is scared it will settle in her lungs. Denies fever or chest pain but is a little SOB. She has body aches, face pain, and headache. Please advise MR if something can be called in for pt's symptoms.   [facility] [address]

## 2017-12-18 NOTE — Telephone Encounter (Signed)
Pt is calling back [phone]

## 2017-12-18 NOTE — Telephone Encounter (Signed)
Pt is calling back [phone] 

## 2017-12-19 NOTE — Telephone Encounter (Signed)
Spoke with patient Pt rec'd Doxy, doing better Nothing further needed

## 2018-01-03 NOTE — Progress Notes (Signed)
Chief Complaint  Patient presents with  . Medicare Wellness    fasting AWV/CPE with pelvic (last pap 2017). No concerns. Will schedule eye exam with Dr Sabra Heck as she is due.    MARVELLA Ross is a 77 y.o. female who presents for annual physical, Medicare wellness visit and follow-up on chronic medical conditions.  She has the following concerns:  CAD: She was noted to have coronary calcification in 2 vessels on CT scan in 2016.She was referred to Dr. Wynonia Lawman and had a myocardial perfusion scan. She exercised 5 minutes 30 seconds into Bruce stage II with no EKG changes. She had no evidence of ischemia with an ejection fraction of 76%. She has not had any recurrent chest pain since then. She has been compliant with taking aspirin and statin, and denies side effects.  She is doing well on 9m atorvastatin (had some issues with discomfort in leg on the initial 477mdose).   Denies any exertional symptoms. Doesn't follow regularly with Dr. TiWynonia Lawmannothing scheduled). She follows a lowfat, low cholesterol diet.  Emphysema and bronchiectasis: Under the care of pulmonologist, last seen in June. She had an exacerbation in mid-December requiring a course of Levaquin and prednisone.  A couple of weeks later she was also treated with 7d of doxycycline, starting 12/15/17. She reports it was a "horrible sinus infection, which didn't get to my lungs" and is now better. She continues on Breo, Spiriva Respimat, flutter valve and hypertonic saline nebulizers. She feels like she is at her baseline.   Vitamin D deficiency: s/p 12 weeks of rx replacement in February 2016. Last vitamin D level was 44 in 12/2016; she continues to take MVI and Ca+D  (plain Ca in the morning, Ca+D in the evening, and now believes her Vit D is 1000 IU vitamin D, previously taking 2000 IU dose).   Immunization History  Administered Date(s) Administered  . Influenza, High Dose Seasonal PF 01/26/2014, 10/19/2014, 08/30/2015, 08/08/2016,  08/05/2017  . Pneumococcal Conjugate-13 12/25/2015  . Pneumococcal Polysaccharide-23 12/30/2016  . Tdap 09/03/2017   Last Pap smear: 12/2015, normal, no high risk HPV detected Last mammogram: 02/2017 Last colonoscopy: 06/2007 Dr. HaAmedeo Plentycolonoscopy)--hyperplastic polyp. I see record of f/u visit 08/2017, and scheduled colonoscopy for 09/2017. It was delayed due to respiratory flare, but she reports she DID have one the end of November. She reports there were multiple polyps, and is due for f/u in 3 years. Last DEXA: 02/2011, normal (T-1.) She was encouraged to schedule f/u DEXA, but hasn't done so yet (high risk due to prednisone use, postmenopausal) Dentist: twice yearly Ophtho: yearly, due now. Exercise: Home exercises 3x/week (stretches and aerobics (15 mins, non-weightbearing)  Other doctors caring for patient include: Pulmonary: Dr. RaChase Callerardiology: Dr. TiWynonia Lawmanentist: Dr. [place]anie [place]: Dr. HaAmedeo Plentysomeone else recently did colonoscopy) Ophtho: Dr. StTeodoro SprayDr. MiAlanda Slimid her cataract surgery  Depression screen: Negative.  Sometimes gets frustrated about having to be home all the time with MaCinda Questhas caregivers during the day), cannot spend the [place], or go away. Fall Screen: negative Functional Status screen: notable only for some leakage of urine (with cough/sneeze). See screen in epic.  End of Life Discussion: Patient does not havea living will and medical power of attorney. She was given information at prior wellness visits, given again.  Past Medical History:  Diagnosis Date  . [place]  . Colon polyp   . Shingles  1/09 and 07/2010  . Unspecified vitamin D deficiency 02/2011 and 2016    Past Surgical History:  Procedure Laterality Date  . CATARACT EXTRACTION, BILATERAL Bilateral 03/2017, 04/2017   [facility], Dr. Alanda Slim  . SHOULDER SURGERY  03/2011   for frozen shoulder, and cyst  removal (Dr. Percell Miller)  . TONSILLECTOMY  child  . VIDEO BRONCHOSCOPY Bilateral 06/21/2016   Procedure: VIDEO BRONCHOSCOPY WITHOUT FLUORO;  Surgeon: Brand Males, MD;  Location: [facility];  Service: Cardiopulmonary;  Laterality: Bilateral;    Social History   Socioeconomic History  . Marital status: Widowed    Spouse name: Not on file  . Number of children: Not on file  . Years of education: Not on file  . Highest education level: Not on file  Social Needs  . Financial resource strain: Not on file  . Food insecurity - worry: Not on file  . Food insecurity - inability: Not on file  . Transportation needs - medical: Not on file  . Transportation needs - non-medical: Not on file  Occupational History  . Occupation: [employer]  Tobacco Use  . Smoking status: Former Smoker    Packs/day: 2.00    Years: 24.00    Pack years: 48.00    Types: Cigarettes    Last attempt to quit: 06/28/1980    Years since quitting: 37.5  . Smokeless tobacco: Never Used  . Tobacco comment: smoked 1-2 PPD x 24 years;  Substance and Sexual Activity  . Alcohol use: Yes    Alcohol/week: 0.0 oz    Comment: 1 glass of wine once a week (0-3)  . Drug use: No  . Sexual activity: Not Currently  Other Topics Concern  . Not on file  Social History Narrative   Lives with Molly Ross (her sister).  Widowed (2011).  Son lives in [place], granddaughter lives in [place] (with her mother--gets to see her when she volunteers at [employer] on Saturdays).   Also has step grandson.   She works at the [employer] in [place].   Decreased to 3d/week in 08/2017.    Family History  Problem Relation Age of Onset  . Stroke Mother   . Diabetes Mother   . Stroke Sister 102       brainstem stroke  . Diabetes Sister   . Seizures Sister   . Ovarian cancer Sister 57       metastatic to lung, liver  . Cancer Sister     Outpatient Encounter Medications as of 01/05/2018  Medication Sig Note  .  aspirin EC 81 MG tablet Take 81 mg by mouth daily.   Marland Kitchen atorvastatin (LIPITOR) 20 MG tablet TAKE 1 TABLET BY MOUTH EVERY DAY   . calcium carbonate (CALCIUM 600) 600 MG TABS tablet Take 600 mg by mouth daily with breakfast. 01/05/2018: In the morning  . Calcium-Magnesium-Vitamin D (CALCIUM 500 PO) Take 1 tablet by mouth daily. 01/05/2018: Takes at night  . fish oil-omega-3 fatty acids 1000 MG capsule Take 3 g by mouth daily.    . Fluticasone Furoate-Vilanterol (BREO ELLIPTA) 100-25 MCG/INH AEPB Inhale 1 puff into the lungs daily.   . Multiple Vitamins-Minerals (MULTIVITAMIN WITH MINERALS) tablet Take 1 tablet by mouth daily.   Marland Kitchen Respiratory Therapy Supplies (FLUTTER) DEVI Use as directed   . Albuterol Sulfate (PROAIR RESPICLICK) 496 (90 Base) MCG/ACT AEPB Inhale 2 puffs into the lungs every 6 (six) hours. Prn wheezing or shortness of breath (Patient not taking: Reported on 11/19/2017)   .  sodium chloride HYPERTONIC 3 % nebulizer solution Take by nebulization 2 (two) times daily. (Patient not taking: Reported on 11/19/2017)   . Tiotropium Bromide Monohydrate (SPIRIVA RESPIMAT) 2.5 MCG/ACT AERS Inhale 2 puffs into the lungs 2 (two) times daily. (Patient not taking: Reported on 11/19/2017) 01/05/2018: Admits to using this just prn  . VENTOLIN HFA 108 (90 Base) MCG/ACT inhaler INHALE 2 PUFFS INTO THE LUNGS EVERY 6 HOURS AS NEEDED FOR WHEEZING OR SHORTNESS OF BREATH. (Patient not taking: Reported on 11/19/2017)   . [DISCONTINUED] doxycycline (VIBRA-TABS) 100 MG tablet Take 1 tablet (100 mg total) by mouth 2 (two) times daily.   . [DISCONTINUED] levofloxacin (LEVAQUIN) 500 MG tablet Take 1 tablet (500 mg total) by mouth daily.   . [DISCONTINUED] predniSONE (DELTASONE) 10 MG tablet Take 4 tabs for 1 day, then 3 tabs for 1 day, 2 tabs for 1 day, then 1 tab for 1 day, then 0.5 mg X 1 day then stop.    No facility-administered encounter medications on file as of 01/05/2018.     No Known Allergies   ROS: The  patient denies anorexia, fever, headaches, vision changes, decreased hearing, ear pain, sore throat, breast concerns, chest pain, palpitations, dizziness, syncope, dyspnea on exertion, cough, swelling, nausea, vomiting, diarrhea, constipation, abdominal pain, melena, hematochezia, indigestion/heartburn, hematuria, incontinence (some stress incontinence), dysuria, vaginal bleeding, discharge, odor or itch, genital lesions, joint pains, numbness, tingling, weakness, tremor, suspicious skin lesions, depression, abnormal bleeding/bruising, or enlarged lymph nodes. Breathing is at baseline.  Still coughs some, sometimes productive of clear or discolored phlegm. Some burping after eating, better since she is more careful about what she eats. Denies dysphagia. Slight weight loss--eating better since cooking for her diabetic sister, doesn't eat between meals.    PHYSICAL EXAM:  BP 120/70   Pulse 64   Ht 5' 8.5" (1.74 m)   Wt 139 lb 12.8 oz (63.4 kg)   BMI 20.95 kg/m   Wt Readings from Last 3 Encounters:  01/05/18 139 lb 12.8 oz (63.4 kg)  11/19/17 139 lb 9.6 oz (63.3 kg)  10/31/17 142 lb 12.8 oz (64.8 kg)    General Appearance:  Alert, cooperative, no distress, appears stated age  Head:  Normocephalic, without obvious abnormality, atraumatic  Eyes:  PERRL, conjunctiva/corneas clear, EOM's intact, fundi benign  Ears:  Normal TM's and external ear canals  Nose: Nares normal, mucosa normal, no drainage or sinustenderness  Throat: Lips, mucosa, and tongue normal; teeth and gums normal  Neck: Supple, no lymphadenopathy; thyroid: no enlargement/tenderness/ nodules; no carotid bruit or JVD  Back:  Spine nontender, no curvature, ROM normal, no CVA tenderness  Lungs:  Clear to auscultation bilaterally; good air movement. No wheezes, orronchi, some crackles at right base; respirations unlabored.  Chest Wall:  No tenderness or deformity  Heart:  Regular rate and rhythm, S1  and S2 normal, no murmur, rub or gallop. Occasional skipped beat.  Breast Exam:  No tenderness, masses, or nipple discharge or inversion.No axillary lymphadenopathy  Abdomen:  Soft, non-tender, nondistended, normoactive bowel sounds, no masses, no hepatosplenomegaly  Genitalia:  Normal external genitalia without lesions, some atrophy noted. BUS and vagina normal; no cervical motion tenderness. No abnormal vaginal discharge. Uterus and adnexa not enlarged, nontender, no masses. Pap not performed  Rectal:  Not performed (had recent colonoscopy, no complaints)  Extremities: No clubbing, cyanosis or edema  Pulses: 2+ and symmetric all extremities  Skin: Skin color, texture, turgor normal, no lesions. She has many seborrheic keratoses throughout her body  many of which are large, largest being the one on her right lateral breast. There are also many scattered angiomas. 14 x 72m SK at 3 o'clock, with darker central portion; SK at 5 o'clock measures 1 x 1 cm, slightly darker at top.  Minimal change from last year, just slightly larger.  Left mid-shin --hyperkeratotic lesions--1 is 140m central one is 49m86mLymph nodes: Cervical, supraclavicular, and axillary nodes normal  Neurologic: CNII-XII intact, normal strength, sensation and gait; reflexes 2+ and symmetric throughout     Psych: Normal mood, affect, hygiene and grooming    ASSESSMENT/PLAN:  Annual physical exam - Plan: POCT Urinalysis DIP (Proadvantage Device), VITAMIN D 25 Hydroxy (Vit-D Deficiency, Fractures), TSH, Glucose, random  Medicare annual wellness visit, subsequent  Coronary artery disease involving native coronary artery of native heart without angina pectoris - stable, asymptomatic. Continue statin, ASA. Increase aerobic exercise  Medication monitoring encounter  Vitamin D deficiency - recheck level, may be on lower OTC D3 dose than when normal last year - Plan: VITAMIN D 25  Hydroxy (Vit-D Deficiency, Fractures)  COPD, moderate (HCC)  Bronchiectasis without complication (HCC) - stable, per pulmonary  Weight loss - discussed healthy diet--may need to eat more if increases exercise as recommended - Plan: TSH  Atherosclerosis of aorta (HCC) - continue statin, aspirin - Plan: atorvastatin (LIPITOR) 20 MG tablet  Actinic keratosis - L shin; to return for cryotherapy (may come sooner than her 6 month med check for this; declined seeing derm)  Return in early September for med check--will be due for fasting lipids, c-met, flu shot. Can return sooner for cryotherapy to lesions on left shin  MOST form was reviewed and updated, no changes. Full Code, Full Care Living will and healthcare power of attorney information given and discussed (again).  Discussed monthly self breast exams and yearly mammograms--encouraged to schedule f/u DEXA when she gets her mammo; at least 30 minutes of aerobic activity at least 5 days/week and weight-bearing exercise 2x/week; proper sunscreen use reviewed; healthy diet, including goals of calcium and vitamin D intake and alcohol recommendations (less than or equal to 1 drink/day) reviewed; regular seatbelt use; changing batteries in smoke detectors. Immunization recommendations discussed--Shingrix at some point (recent shingles, no rush). Continue yearly high dose flu shots. Colonoscopy was done end of November, per pt, given 3 year f/u recommendation due to multiple polyps, per patient. Will get results.    Medicare Attestation I have personally reviewed: The patient's medical and social history Their use of alcohol, tobacco or illicit drugs Their current medications and supplements The patient's functional ability including ADLs,fall risks, home safety risks, cognitive, and hearing and visual impairment Diet and physical activities Evidence for depression or mood disorders  The patient's weight, height and BMI have been recorded  in the chart.  I have made referrals, counseling, and provided education to the patient based on review of the above and I have provided the patient with a written personalized care plan for preventive services.

## 2018-01-05 ENCOUNTER — Encounter: Payer: Self-pay | Admitting: Family Medicine

## 2018-01-05 ENCOUNTER — Ambulatory Visit (INDEPENDENT_AMBULATORY_CARE_PROVIDER_SITE_OTHER): Payer: Medicare Other | Admitting: Family Medicine

## 2018-01-05 VITALS — BP 120/70 | HR 64 | Ht 68.5 in | Wt 139.8 lb

## 2018-01-05 DIAGNOSIS — I700 Atherosclerosis of aorta: Secondary | ICD-10-CM | POA: Diagnosis not present

## 2018-01-05 DIAGNOSIS — Z5181 Encounter for therapeutic drug level monitoring: Secondary | ICD-10-CM | POA: Diagnosis not present

## 2018-01-05 DIAGNOSIS — R634 Abnormal weight loss: Secondary | ICD-10-CM | POA: Diagnosis not present

## 2018-01-05 DIAGNOSIS — I2510 Atherosclerotic heart disease of native coronary artery without angina pectoris: Secondary | ICD-10-CM | POA: Diagnosis not present

## 2018-01-05 DIAGNOSIS — E559 Vitamin D deficiency, unspecified: Secondary | ICD-10-CM

## 2018-01-05 DIAGNOSIS — J449 Chronic obstructive pulmonary disease, unspecified: Secondary | ICD-10-CM | POA: Diagnosis not present

## 2018-01-05 DIAGNOSIS — J479 Bronchiectasis, uncomplicated: Secondary | ICD-10-CM | POA: Diagnosis not present

## 2018-01-05 DIAGNOSIS — Z0000 Encounter for general adult medical examination without abnormal findings: Secondary | ICD-10-CM

## 2018-01-05 DIAGNOSIS — L570 Actinic keratosis: Secondary | ICD-10-CM

## 2018-01-05 LAB — POCT URINALYSIS DIP (PROADVANTAGE DEVICE)
BILIRUBIN UA: NEGATIVE
Blood, UA: NEGATIVE
Glucose, UA: NEGATIVE mg/dL
Ketones, POC UA: NEGATIVE mg/dL
LEUKOCYTES UA: NEGATIVE
NITRITE UA: NEGATIVE
PH UA: 6.5 (ref 5.0–8.0)
PROTEIN UA: NEGATIVE mg/dL
Specific Gravity, Urine: 1.015
Urobilinogen, Ur: NEGATIVE

## 2018-01-05 MED ORDER — ATORVASTATIN CALCIUM 20 MG PO TABS: 20.0000 mg | ORAL_TABLET | Freq: Every day | ORAL | 1 refills | Status: DC

## 2018-01-05 NOTE — Patient Instructions (Addendum)
  HEALTH MAINTENANCE RECOMMENDATIONS:  It is recommended that you get at least 30 minutes of aerobic exercise at least 5 days/week (for weight loss, you may need as much as 60-90 minutes). This can be any activity that gets your heart rate up. This can be divided in 10-15 minute intervals if needed, but try and build up your endurance at least once a week.  Weight bearing exercise is also recommended twice weekly.  Eat a healthy diet with lots of vegetables, fruits and fiber.  "Colorful" foods have a lot of vitamins (ie green vegetables, tomatoes, red peppers, etc).  Limit sweet tea, regular sodas and alcoholic beverages, all of which has a lot of calories and sugar.  Up to 1 alcoholic drink daily may be beneficial for women (unless trying to lose weight, watch sugars).  Drink a lot of water.  Calcium recommendations are 1200-1500 mg daily (1500 mg for postmenopausal women or women without ovaries), and vitamin D 1000 IU daily.  This should be obtained from diet and/or supplements (vitamins), and calcium should not be taken all at once, but in divided doses.  Monthly self breast exams and yearly mammograms for women over the age of 78 is recommended.  Sunscreen of at least SPF 30 should be used on all sun-exposed parts of the skin when outside between the hours of 10 am and 4 pm (not just when at beach or pool, but even with exercise, golf, tennis, and yard work!)  Use a sunscreen that says "broad spectrum" so it covers both UVA and UVB rays, and make sure to reapply every 1-2 hours.  Remember to change the batteries in your smoke detectors when changing your clock times in the spring and fall.  Use your seat belt every time you are in a car, and please drive safely and not be distracted with cell phones and texting while driving.   Molly Ross , Thank you for taking time to come for your Medicare Wellness Visit. I appreciate your ongoing commitment to your health goals. Please review the following  plan we discussed and let me know if I can assist you in the future.   These are the goals we discussed: Goals    None      This is a list of the screening recommended for you and due dates:  Health Maintenance  Topic Date Due  . Tetanus Vaccine  09/04/2027  . Flu Shot  Completed  . DEXA scan (bone density measurement)  Completed  . Pneumonia vaccines  Completed   Please call to schedule your mammogram and bone density test (for the same day, ideally).  The mammogram is due in March.  Increase your cardio and add in weight-bearing exercise as we discussed.  I recommend getting the new shingles vaccine (Shingrix). You will need to check with your insurance to see if it is covered, and if covered by Medicare Part D, you need to get from the pharmacy rather than our office.  It is a series of 2 injections, spaced 2 months apart. There is no rush to get this, since you recently had shingles. Sometime in the next 3-6 months at the soonest.

## 2018-01-06 LAB — VITAMIN D 25 HYDROXY (VIT D DEFICIENCY, FRACTURES): Vit D, 25-Hydroxy: 52.2 ng/mL (ref 30.0–100.0)

## 2018-01-06 LAB — TSH: TSH: 1.23 u[IU]/mL (ref 0.450–4.500)

## 2018-01-06 LAB — GLUCOSE, RANDOM: Glucose: 83 mg/dL (ref 65–99)

## 2018-01-08 ENCOUNTER — Encounter: Payer: Self-pay | Admitting: Family Medicine

## 2018-01-28 ENCOUNTER — Other Ambulatory Visit: Payer: Self-pay | Admitting: Family Medicine

## 2018-01-28 DIAGNOSIS — Z1239 Encounter for other screening for malignant neoplasm of breast: Secondary | ICD-10-CM

## 2018-02-18 ENCOUNTER — Ambulatory Visit (INDEPENDENT_AMBULATORY_CARE_PROVIDER_SITE_OTHER): Payer: Medicare Other

## 2018-02-18 ENCOUNTER — Encounter: Payer: Self-pay | Admitting: Family Medicine

## 2018-02-18 DIAGNOSIS — Z79899 Other long term (current) drug therapy: Secondary | ICD-10-CM

## 2018-02-18 DIAGNOSIS — M85851 Other specified disorders of bone density and structure, right thigh: Secondary | ICD-10-CM

## 2018-02-18 DIAGNOSIS — M8580 Other specified disorders of bone density and structure, unspecified site: Secondary | ICD-10-CM

## 2018-02-18 DIAGNOSIS — R928 Other abnormal and inconclusive findings on diagnostic imaging of breast: Secondary | ICD-10-CM

## 2018-02-18 DIAGNOSIS — Z780 Asymptomatic menopausal state: Secondary | ICD-10-CM | POA: Diagnosis not present

## 2018-02-18 DIAGNOSIS — Z1239 Encounter for other screening for malignant neoplasm of breast: Secondary | ICD-10-CM

## 2018-02-18 DIAGNOSIS — Z1231 Encounter for screening mammogram for malignant neoplasm of breast: Secondary | ICD-10-CM | POA: Diagnosis not present

## 2018-02-18 DIAGNOSIS — M8589 Other specified disorders of bone density and structure, multiple sites: Secondary | ICD-10-CM | POA: Diagnosis not present

## 2018-02-18 HISTORY — DX: Other specified disorders of bone density and structure, unspecified site: M85.80

## 2018-02-19 ENCOUNTER — Other Ambulatory Visit: Payer: Self-pay | Admitting: Family Medicine

## 2018-02-19 DIAGNOSIS — R928 Other abnormal and inconclusive findings on diagnostic imaging of breast: Secondary | ICD-10-CM

## 2018-02-25 ENCOUNTER — Ambulatory Visit
Admission: RE | Admit: 2018-02-25 | Discharge: 2018-02-25 | Disposition: A | Discharge status: Home / Self Care | Payer: Medicare Other | Source: Ambulatory Visit | Attending: Family Medicine | Admitting: Family Medicine

## 2018-02-25 ENCOUNTER — Other Ambulatory Visit: Payer: Self-pay | Admitting: Family Medicine

## 2018-02-25 DIAGNOSIS — N6324 Unspecified lump in the left breast, lower inner quadrant: Secondary | ICD-10-CM | POA: Diagnosis not present

## 2018-02-25 DIAGNOSIS — R928 Other abnormal and inconclusive findings on diagnostic imaging of breast: Secondary | ICD-10-CM

## 2018-02-25 DIAGNOSIS — N6320 Unspecified lump in the left breast, unspecified quadrant: Secondary | ICD-10-CM

## 2018-03-03 ENCOUNTER — Ambulatory Visit
Admission: RE | Admit: 2018-03-03 | Discharge: 2018-03-03 | Disposition: A | Discharge status: Home / Self Care | Payer: Medicare Other | Source: Ambulatory Visit | Attending: Family Medicine | Admitting: Family Medicine

## 2018-03-03 ENCOUNTER — Other Ambulatory Visit: Payer: Self-pay | Admitting: Family Medicine

## 2018-03-03 DIAGNOSIS — N6320 Unspecified lump in the left breast, unspecified quadrant: Secondary | ICD-10-CM

## 2018-03-03 DIAGNOSIS — N6324 Unspecified lump in the left breast, lower inner quadrant: Secondary | ICD-10-CM | POA: Diagnosis not present

## 2018-03-03 DIAGNOSIS — Z170 Estrogen receptor positive status [ER+]: Secondary | ICD-10-CM | POA: Diagnosis not present

## 2018-03-03 DIAGNOSIS — C50312 Malignant neoplasm of lower-inner quadrant of left female breast: Secondary | ICD-10-CM | POA: Diagnosis not present

## 2018-03-04 ENCOUNTER — Encounter: Payer: Self-pay | Admitting: *Deleted

## 2018-03-04 ENCOUNTER — Telehealth: Payer: Self-pay | Admitting: Hematology and Oncology

## 2018-03-04 NOTE — Telephone Encounter (Signed)
Spoke with patient to confirm morning [facility] appointment for 3/27, packet emailed to patient

## 2018-03-06 ENCOUNTER — Other Ambulatory Visit: Payer: Self-pay | Admitting: *Deleted

## 2018-03-06 DIAGNOSIS — Z170 Estrogen receptor positive status [ER+]: Secondary | ICD-10-CM

## 2018-03-06 DIAGNOSIS — C50312 Malignant neoplasm of lower-inner quadrant of left female breast: Secondary | ICD-10-CM

## 2018-03-06 HISTORY — DX: Estrogen receptor positive status (ER+): Z17.0

## 2018-03-06 HISTORY — DX: Malignant neoplasm of lower-inner quadrant of left female breast: C50.312

## 2018-03-11 ENCOUNTER — Encounter: Payer: Self-pay | Admitting: *Deleted

## 2018-03-11 ENCOUNTER — Inpatient Hospital Stay: Payer: Medicare Other

## 2018-03-11 ENCOUNTER — Other Ambulatory Visit: Payer: Self-pay

## 2018-03-11 ENCOUNTER — Inpatient Hospital Stay: Payer: Medicare Other | Attending: Hematology and Oncology | Admitting: Hematology and Oncology

## 2018-03-11 ENCOUNTER — Encounter: Payer: Self-pay | Admitting: Physical Therapy

## 2018-03-11 ENCOUNTER — Ambulatory Visit
Admission: RE | Admit: 2018-03-11 | Discharge: 2018-03-11 | Disposition: A | Discharge status: Home / Self Care | Payer: Medicare Other | Source: Ambulatory Visit | Attending: Radiation Oncology | Admitting: Radiation Oncology

## 2018-03-11 ENCOUNTER — Other Ambulatory Visit: Payer: Self-pay | Admitting: General Surgery

## 2018-03-11 ENCOUNTER — Encounter: Payer: Self-pay | Admitting: Radiation Oncology

## 2018-03-11 ENCOUNTER — Ambulatory Visit: Payer: Medicare Other | Attending: General Surgery | Admitting: Physical Therapy

## 2018-03-11 ENCOUNTER — Encounter: Payer: Self-pay | Admitting: Hematology and Oncology

## 2018-03-11 DIAGNOSIS — C50312 Malignant neoplasm of lower-inner quadrant of left female breast: Secondary | ICD-10-CM

## 2018-03-11 DIAGNOSIS — Z170 Estrogen receptor positive status [ER+]: Principal | ICD-10-CM

## 2018-03-11 DIAGNOSIS — R293 Abnormal posture: Secondary | ICD-10-CM | POA: Insufficient documentation

## 2018-03-11 DIAGNOSIS — Z006 Encounter for examination for normal comparison and control in clinical research program: Secondary | ICD-10-CM

## 2018-03-11 DIAGNOSIS — Z87891 Personal history of nicotine dependence: Secondary | ICD-10-CM | POA: Diagnosis not present

## 2018-03-11 DIAGNOSIS — Z808 Family history of malignant neoplasm of other organs or systems: Secondary | ICD-10-CM | POA: Diagnosis not present

## 2018-03-11 DIAGNOSIS — Z8042 Family history of malignant neoplasm of prostate: Secondary | ICD-10-CM | POA: Diagnosis not present

## 2018-03-11 LAB — CMP (CANCER CENTER ONLY)
ALBUMIN: 3.9 g/dL (ref 3.5–5.0)
ALK PHOS: 96 U/L (ref 40–150)
ALT: 11 U/L (ref 0–55)
ANION GAP: 9 (ref 3–11)
AST: 18 U/L (ref 5–34)
BUN: 21 mg/dL (ref 7–26)
CO2: 28 mmol/L (ref 22–29)
Calcium: 10.4 mg/dL (ref 8.4–10.4)
Chloride: 104 mmol/L (ref 98–109)
Creatinine: 0.69 mg/dL (ref 0.60–1.10)
GFR, Est AFR Am: >60 mL/min (ref >60)
GFR, Estimated: >60 mL/min (ref >60)
GLUCOSE: 91 mg/dL (ref 70–140)
Potassium: 4.2 mmol/L (ref 3.5–5.1)
SODIUM: 141 mmol/L (ref 136–145)
Total Bilirubin: 1 mg/dL (ref 0.2–1.2)
Total Protein: 7.7 g/dL (ref 6.4–8.3)

## 2018-03-11 LAB — CBC WITH DIFFERENTIAL (CANCER CENTER ONLY)
Basophils Absolute: 0.1 10*3/uL (ref 0.0–0.1)
Basophils Relative: 1 %
Eosinophils Absolute: 0.1 10*3/uL (ref 0.0–0.5)
Eosinophils Relative: 1 %
HEMATOCRIT: 39.9 % (ref 34.8–46.6)
Hemoglobin: 12.8 g/dL (ref 11.6–15.9)
LYMPHS PCT: 22 %
Lymphs Abs: 1.8 10*3/uL (ref 0.9–3.3)
MCH: 29.4 pg (ref 25.1–34.0)
MCHC: 32.1 g/dL (ref 31.5–36.0)
MCV: 91.7 fL (ref 79.5–101.0)
MONO ABS: 0.9 10*3/uL (ref 0.1–0.9)
MONOS PCT: 11 %
NEUTROS ABS: 5.3 10*3/uL (ref 1.5–6.5)
Neutrophils Relative %: 65 %
Platelet Count: 311 10*3/uL (ref 145–400)
RBC: 4.35 MIL/uL (ref 3.70–5.45)
RDW: 13.6 % (ref 11.2–14.5)
WBC Count: 8.1 10*3/uL (ref 3.9–10.3)

## 2018-03-11 NOTE — Progress Notes (Signed)
Clinical Social Work [facility] Psychosocial Distress Screening [place]  Patient completed distress screening protocol and scored a 3 on the Psychosocial Distress Thermometer which indicates mild distress. Clinical Social Worker met with patient and patients family in [facility] to assess for distress and other psychosocial needs. Patient stated she was feeling overwhelmed but felt "better" after meeting with the treatment team and getting more information on her treatment plan. CSW and patient discussed common feeling and emotions when being diagnosed with cancer, and the importance of support during treatment. CSW informed patient of the support team and support services at [facility]. CSW provided contact information and encouraged patient to call with any questions or concerns.  ONCBCN DISTRESS SCREENING 03/11/2018  Screening Type Initial Screening  Distress experienced in past week (1-10) 3  Information Concerns Type Lack of info about diagnosis;Lack of info about treatment;Lack of info about complementary therapy choices;Lack of info about maintaining fitness     Johnnye Lana, MSW, LCSW, OSW-C Clinical Social Worker [facility] [phone]

## 2018-03-11 NOTE — Progress Notes (Signed)
Radiation Oncology         [phone] ________________________________  Initial outpatient Consultation  Name: Molly Ross MRN: [id number]  Date: 03/11/2018  DOB: Jun 16, 1941  AY:TKZSW, Eve, MD  Stark Klein, MD   REFERRING PHYSICIAN: Stark Klein, MD  DIAGNOSIS:    ICD-10-CM   1. 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 female who presented for a screening mammogram on 02/18/2018 with results showing: Further evaluation is suggested for possible mass in the left breast. She proceeded to left diagnostic mammogram with ultrasonography on 02/25/2018 with results showing: Suspicious mass in the 8 o'clock region of the left breast.   Left needle core biopsy, 8 o'clock on 03/03/2018 showed invasive mammary carcinoma with characteristics as described above in the diagnosis. Prognostic indicators significant for: estrogen receptor, 100% positive and progesterone receptor, 90% positive, both with strong staining intensity. Proliferation marker Ki67 at 5%. HER2 negative.  On review of systems, she reports sinus problems, exertional SOB worsened with walking up stairs, productive cough, change in stool habits, urinary frequency (per patient due to age), and moles that she notes are not abnormal in appearance. She wears glasses. She denies any other symptoms.   Menarche: 68-43 years old Age at first live birth: 77 years old GP: GxP1 LMP: 71 Contraceptive: Yes, last use in 1983 HRT: Yes, for approximately 6 years and d/c use in [place]: No  PAST MEDICAL HISTORY:  has a past medical history of Bronchiectasis ([place]), Colon polyp, Shingles (1/09 and 07/2010),  and Unspecified vitamin D deficiency (02/2011 and 2016).    PAST SURGICAL HISTORY: Past Surgical History:  Procedure Laterality Date  . CATARACT EXTRACTION, BILATERAL Bilateral 03/2017, 04/2017   [facility], Dr. Alanda Slim  . SHOULDER SURGERY  03/2011   for frozen shoulder, and cyst removal (Dr. Percell Miller)  . TONSILLECTOMY  child  . VIDEO BRONCHOSCOPY Bilateral 06/21/2016   Procedure: VIDEO BRONCHOSCOPY WITHOUT FLUORO;  Surgeon: Brand Males, MD;  Location: [facility];  Service: Cardiopulmonary;  Laterality: Bilateral;    FAMILY HISTORY: family history includes Cancer in her sister; Diabetes in her mother and sister; Lung cancer in her father; Ovarian cancer in her maternal aunt; Ovarian cancer (age of onset: 52) in her sister; Seizures in her sister; Stroke in her mother; Stroke (age of onset: 87) in her sister.  SOCIAL HISTORY:  reports that she quit smoking about 37 years ago. Her smoking use included cigarettes. She has a 48.00 pack-year smoking history. She has never used smokeless tobacco. She reports that she drinks alcohol. She reports that she does not use drugs.  ALLERGIES: Patient has no known allergies.  MEDICATIONS:  Current Outpatient Medications  Medication Sig Dispense Refill  . aspirin EC 81 MG tablet Take 81 mg by mouth daily.    Marland Kitchen atorvastatin (LIPITOR) 20 MG tablet Take 1 tablet (20 mg total) by mouth daily. 90 tablet 1  . Calcium-Magnesium-Vitamin D (CALCIUM 500 PO) Take 1 tablet by mouth daily.    . fish oil-omega-3 fatty acids 1000 MG capsule Take 3 g by mouth daily.     . Fluticasone Furoate-Vilanterol (BREO ELLIPTA) 100-25 MCG/INH AEPB Inhale 1 puff  into the lungs daily. 60 each 0  . Multiple Vitamins-Minerals (MULTIVITAMIN WITH MINERALS) tablet Take 1 tablet by mouth daily.    . Tiotropium Bromide Monohydrate (SPIRIVA RESPIMAT) 2.5 MCG/ACT AERS Inhale 2 puffs into the lungs 2 (two) times daily. 1 Inhaler 5  . VENTOLIN HFA 108 (90 Base) MCG/ACT inhaler INHALE 2 PUFFS  INTO THE LUNGS EVERY 6 HOURS AS NEEDED FOR WHEEZING OR SHORTNESS OF BREATH. 18 Inhaler 3   No current facility-administered medications for this encounter.     REVIEW OF SYSTEMS: A 10+ POINT REVIEW OF SYSTEMS WAS OBTAINED including neurology, dermatology, psychiatry, cardiac, respiratory, lymph, extremities, GI, GU, Musculoskeletal, constitutional, breasts, reproductive, HEENT.  All pertinent positives are noted in the HPI.  All others are negative.   PHYSICAL EXAM:  Vitals with BMI 03/11/2018  Height 5' 8.5"  Weight 140 lbs 2 oz  BMI 95.32  Systolic 023  Diastolic 73  Pulse 71  Respirations 17    General: Alert and oriented, in no acute distress HEENT: Head is normocephalic. Extraocular movements are intact. Oropharynx is clear. Neck: Neck is supple, no palpable cervical or supraclavicular lymphadenopathy. Heart: Regular in rate and rhythm with no murmurs, rubs, or gallops. Chest: Clear to auscultation bilaterally, with no rhonchi, wheezes, or rales. Abdomen: Soft, nontender, nondistended, with no rigidity or guarding. Extremities: No cyanosis or edema. Lymphatics: see Neck Exam Skin: Numerous seborrheic keratoses over the chest Musculoskeletal: symmetric strength and muscle tone throughout. Neurologic: Cranial nerves II through XII are grossly intact. No obvious focalities. Speech is fluent. Coordination is intact. Psychiatric: Judgment and insight are intact. Affect is appropriate. Breasts: in the LIQ adjacent to the areola of the left breast, she has a 1.5 cm mass with overlying bruising from biopsy. No palpable adenopathy in the bilateral axilla or in the right breast. No other palpable masses appreciated in the breasts or axillae.   ECOG = 0  0 - Asymptomatic (Fully active, able to carry on all predisease activities without restriction)  1 - Symptomatic but completely ambulatory (Restricted in physically strenuous activity but ambulatory and able to carry out work of a light  or sedentary nature. For example, light housework, office work)  2 - Symptomatic, <50% in bed during the day (Ambulatory and capable of all self care but unable to carry out any work activities. Up and about more than 50% of waking hours)  3 - Symptomatic, >50% in bed, but not bedbound (Capable of only limited self-care, confined to bed or chair 50% or more of waking hours)  4 - Bedbound (Completely disabled. Cannot carry on any self-care. Totally confined to bed or chair)  5 - Death   Eustace Pen MM, Creech RH, Tormey DC, et al. [phone]). "Toxicity and response criteria of the [facility]". [place] Oncol. 5 (6): 649-55   LABORATORY DATA:  Lab Results  Component Value Date   WBC 8.1 03/11/2018   HGB 12.8 05/16/2017   HCT 39.9 03/11/2018   MCV 91.7 03/11/2018   PLT 311 03/11/2018   CMP     Component Value Date/Time   NA 141 03/11/2018 0848   K 4.2 03/11/2018 0848   CL 104 03/11/2018 0848   CO2 28 03/11/2018 0848   GLUCOSE 91 03/11/2018 0848   BUN 21 03/11/2018 0848   CREATININE 0.69 03/11/2018 0848   CREATININE 0.56 (L) 09/01/2017 1307   CALCIUM 10.4 03/11/2018 0848   PROT 7.7 03/11/2018 0848   ALBUMIN 3.9 03/11/2018 0848   AST  18 03/11/2018 0848   ALT 11 03/11/2018 0848   ALKPHOS 96 03/11/2018 0848   BILITOT 1.0 03/11/2018 0848   GFRNONAA >60 03/11/2018 0848   GFRAA >60 03/11/2018 0848         RADIOGRAPHY: Dg Bone Density  Result Date: 02/18/2018 EXAM: DUAL X-RAY ABSORPTIOMETRY (DXA) FOR BONE MINERAL DENSITY IMPRESSION: Referring Physician:  EVE KNAPP PATIENT: Name: Rilea, Arutyunyan Patient ID: [id number] Birth Date: 1940/12/25 Height: 68.5 in. Sex: Female Measured: 02/18/2018 Weight: 140.8 lbs. Indications: Advanced Age, Caucasian, Estrogen Deficiency, Postmenopausal, Previous Smoker Fractures: Foot, Wrist Treatments: Calcium, HRT, Vitamin D ASSESSMENT: The BMD measured at Femur Neck Right is 0.781 g/cm2 with a T-score of -1.8. This patient is  considered osteopenic according to [facility]) criteria. Site Region Measured Date Measured Age WHO YA BMD Classification T-score AP Spine L1-L4 02/18/2018 [age] Osteopenia -1.2 1.050 g/cm2 DualFemur Neck Right 2/18/2018 76.2 years Osteopenia -1.8 0.781 g/cm2 World Health Organization [facility]) criteria for post-menopausal, Caucasian Women: Normal       T-score at or above -1 SD Osteopenia   T-score between -1 and -2.5 SD Osteoporosis T-score at or below -2.5 SD RECOMMENDATION: [place] recommends that FDA-approved medical therapies be considered in postmenopausal women and men age 14 or older with a: 1. Hip or vertebral (clinical or morphometric) fracture. 2. T-score of <-2.5 at the spine or hip. 3. Ten-year fracture probability by FRAX of 3% or greater for hip fracture or 20% or greater for major osteoporotic fracture. All treatments decisions require clinical judgment and consideration of individual patient factors, including patient preferences, co-morbidities, previous drug use, risk factors not captured in the FRAX model (e.g. falls, vitamin D deficiency, increased bone turnover, interval significant decline in bone density) and possible under - or over-estimation of fracture risk by FRAX. All patients should ensure an adequate intake of dietary calcium (1200 mg/d) and vitamin D (800 IU daily) unless contraindicated. FOLLOW-UP: People with diagnosed cases of osteoporosis or at high risk for fracture should have regular bone mineral density tests. For patients eligible for Medicare, routine testing is allowed once every 2 years. The testing frequency can be increased to one year for patients who have rapidly progressing disease, those who are receiving or discontinuing medical therapy to restore bone mass, or have additional risk factors. I have reviewed this report and agree with the above findings. [facility] Patient: Ned Card Referring Physician: Rita Ohara Birth Date: Jul 14, 1941 [age] Patient ID: [id number] Height: 68.5 in. Weight: 140.8 lbs. Measured: 02/18/2018 11:10:39 AM (16 SP 2) Sex: Female Ethnicity: White Analyzed: 02/18/2018 11:14:31 AM (16 SP 2) FRAX* 10-year Probability of Fracture Based on femoral neck BMD: DualFemur (Right) Major Osteoporotic Fracture: 11.8% Hip Fracture:                3.1% Population:                  [place] (Caucasian) Risk Factors:                None *FRAX is a Materials engineer of the [employer] for Metabolic Bone Disease, a World Pharmacologist (WHO) [facility]. ASSESSMENT: The probability of a major osteoporotic fracture is 11.8% within the next ten years. The probability of a hip fracture is 3.1% within the next ten years. Electronically Signed   By: Marijo Conception, M.D.   On: 02/18/2018 11:36   US Breast  [place] Axilla  Result Date: 02/25/2018 CLINICAL DATA:  Patient was called back from screening mammogram for a possible mass in the left breast. EXAM: DIGITAL DIAGNOSTIC LEFT MAMMOGRAM WITH TOMO ULTRASOUND LEFT BREAST COMPARISON:  Previous exam(s). ACR Breast Density Category b: There are scattered areas of fibroglandular density. FINDINGS: Additional imaging of the left breast was performed showing persistence of a 9 mm spiculated mass in the lower-inner quadrant of the left breast. There are no malignant type microcalcifications. On physical exam, I palpate discrete thickening in the left breast at 8 o'clock 1 cm from the nipple. Targeted ultrasound is performed, showing an irregular hypoechoic mass in the left breast at 8 o'clock 1 cm from the nipple measuring 1.2 x 1.2 x 0.9 cm. Sonographic evaluation the left axilla does not show any enlarged adenopathy. IMPRESSION: Suspicious mass in the 8 o'clock region of the left breast. RECOMMENDATION: Ultrasound-guided core biopsy of the mass in the 8 o'clock region of the left breast is recommended. The biopsy  will be scheduled at the patient's convenience. I have discussed the findings and recommendations with the patient. Results were also provided in writing at the conclusion of the visit. If applicable, a reminder letter will be sent to the patient regarding the next appointment. BI-RADS CATEGORY  5: Highly suggestive of malignancy. Electronically Signed   By: Lillia Mountain M.D.   On: 02/25/2018 14:48   Mm Diag Breast Tomo Uni Left  Result Date: 02/25/2018 CLINICAL DATA:  Patient was called back from screening mammogram for a possible mass in the left breast. EXAM: DIGITAL DIAGNOSTIC LEFT MAMMOGRAM WITH TOMO ULTRASOUND LEFT BREAST COMPARISON:  Previous exam(s). ACR Breast Density Category b: There are scattered areas of fibroglandular density. FINDINGS: Additional imaging of the left breast was performed showing persistence of a 9 mm spiculated mass in the lower-inner quadrant of the left breast. There are no malignant type microcalcifications. On physical exam, I palpate discrete thickening in the left breast at 8 o'clock 1 cm from the nipple. Targeted ultrasound is performed, showing an irregular hypoechoic mass in the left breast at 8 o'clock 1 cm from the nipple measuring 1.2 x 1.2 x 0.9 cm. Sonographic evaluation the left axilla does not show any enlarged adenopathy. IMPRESSION: Suspicious mass in the 8 o'clock region of the left breast. RECOMMENDATION: Ultrasound-guided core biopsy of the mass in the 8 o'clock region of the left breast is recommended. The biopsy will be scheduled at the patient's convenience. I have discussed the findings and recommendations with the patient. Results were also provided in writing at the conclusion of the visit. If applicable, a reminder letter will be sent to the patient regarding the next appointment. BI-RADS CATEGORY  5: Highly suggestive of malignancy. Electronically Signed   By: Lillia Mountain M.D.   On: 02/25/2018 14:48   Mm Screening Breast Tomo Bilateral  Result Date:  02/18/2018 CLINICAL DATA:  Screening. EXAM: DIGITAL SCREENING BILATERAL MAMMOGRAM WITH TOMO AND CAD COMPARISON:  Previous exam(s). ACR Breast Density Category c: The breast tissue is heterogeneously dense, which may obscure small masses. FINDINGS: In the left breast, a possible mass warrants further evaluation. In the right breast, no findings suspicious for malignancy. Images were processed with CAD. IMPRESSION: Further evaluation is suggested for possible mass in the left breast. RECOMMENDATION: Diagnostic mammogram and possibly ultrasound of the left breast. (Code:[id number]) The patient will be contacted regarding the findings, and additional imaging will be scheduled. BI-RADS CATEGORY  0: Incomplete. Need additional imaging evaluation  and/or prior mammograms for comparison. Electronically Signed   By: Lillia Mountain M.D.   On: 02/18/2018 12:53   Mm Clip Placement Left  Result Date: 03/03/2018 CLINICAL DATA:  Post ultrasound-guided biopsy of a suspicious mass in the left breast 8 o'clock 1 cm from the nipple. EXAM: DIAGNOSTIC LEFT MAMMOGRAM POST ULTRASOUND BIOPSY COMPARISON:  Previous exam(s). FINDINGS: Mammographic images were obtained following ultrasound guided biopsy of a suspicious mass in the left breast at 8 o'clock 1 cm from the nipple. A heart shaped biopsy marking clip appears appropriately positioned at the site of the biopsied mass in the left breast. IMPRESSION: Heart shaped biopsy marking clip at site of biopsied mass in the left breast at the 8 o'clock position. Final Assessment: Post Procedure Mammograms for Marker Placement Electronically Signed   By: Everlean Alstrom M.D.   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 This was found to be concordant by Dr. Everlean Alstrom. Pathology results were discussed with the  patient by telephone. The patient reported doing well after the biopsy with tenderness at the site. Post biopsy instructions and care were reviewed and questions were answered. The patient was encouraged to call [facility] for any additional concerns. The patient was referred to [facility] on March 11, 2018. Pathology results reported by Roselind Messier, RN on 03/04/2018. Electronically Signed   By: Everlean Alstrom M.D.   On: 03/04/2018 15:04   Result Date: 03/04/2018 CLINICAL DATA:  77 year old female with a suspicious mass in the left breast at 8 o'clock 1 cm from the nipple. EXAM: ULTRASOUND GUIDED LEFT BREAST CORE NEEDLE BIOPSY COMPARISON:  Previous exam(s). FINDINGS: I met with the patient and we discussed the procedure of ultrasound-guided biopsy, including benefits and alternatives. We discussed the high likelihood of a successful procedure. We discussed the risks of the procedure, including infection, bleeding, tissue injury, clip migration, and inadequate sampling. Informed written consent was given. The usual time-out protocol was performed immediately prior to the procedure. Lesion quadrant: Lower inner Using sterile technique and 1% Lidocaine as local anesthetic, under direct ultrasound visualization, a 12 gauge spring-loaded device was used to perform biopsy of the mass in the left breast at the 8 o'clock position using a medial to lateral approach. At the conclusion of the procedure a heart shaped tissue marker clip was deployed into the biopsy cavity. Follow up 2 view mammogram was performed and dictated separately. IMPRESSION: Ultrasound guided biopsy of the mass in the left breast at the 8 o'clock position. No apparent complications. Electronically Signed: By: Everlean Alstrom M.D. On: 03/03/2018 14:30      IMPRESSION/PLAN: Left breast cancer  It was a pleasure meeting the patient today. She  understands that she has a favorable prognosis. She is enthusiastic about pursing a lumpectomy. She will also undergo genetic counseling.  She is enthusiastic about taking an anti-estrogen pill adjuvantly. We discussed the randomized data of the modest role that radiation therapy would play in her disease. She understands that radiation therapy would not improve her life expectancy, but would reduce her risk of locorecurruce of about 8% over the next decade. After a thorough discussion, she is enthusiastic about taking the anti-estrogen pill alone and not pursing radiotherapy. If for  some reason her disease appears more high risk after surgery, I can see her back for another discussion, but otherwise it is not necessary for her to follow up with me.    __________________________________________   Eppie Gibson, MD   This document serves as a record of services personally performed by Eppie Gibson, MD. It was created on her behalf by Steva Colder, a trained medical scribe. The creation of this record is based on the scribe's personal observations and the provider's statements to them. This document has been checked and approved by the attending provider.

## 2018-03-11 NOTE — Therapy (Signed)
[place], [place], [address] Phone: [phone]   Fax:  [phone]  Physical Therapy Evaluation  Patient Details  Name: Molly Ross MRN: [id number] Date of Birth: [birth year] Referring Provider: Dr. Stark Klein   Encounter Date: 03/11/2018  PT End of Session - 03/11/18 1150    Visit Number  1    Number of Visits  2    Date for PT Re-Evaluation  05/06/18    PT Start Time  0920    PT Stop Time  0940 Also saw pt from 1100-1112 for a total of 32 minutes    PT Time Calculation (min)  20 min    Activity Tolerance  Patient tolerated treatment well    Behavior During Therapy  [facility] for tasks assessed/performed       Past Medical History:  Diagnosis Date  . Bronchiectasis ([place])   . Colon polyp   . Shingles 1/09 and 07/2010  . Unspecified vitamin D deficiency 02/2011 and 2016    Past Surgical History:  Procedure Laterality Date  . CATARACT EXTRACTION, BILATERAL Bilateral 03/2017, 04/2017   [facility], Dr. Alanda Slim  . SHOULDER SURGERY  03/2011   for frozen shoulder, and cyst removal (Dr. Percell Miller)  . TONSILLECTOMY  child  . VIDEO BRONCHOSCOPY Bilateral 06/21/2016   Procedure: VIDEO BRONCHOSCOPY WITHOUT FLUORO;  Surgeon: Brand Males, MD;  Location: [facility];  Service: Cardiopulmonary;  Laterality: Bilateral;    There were no vitals filed for this visit.   Subjective Assessment - 03/11/18 1144    Subjective  Patient reports she is here today ot be seen by her medical team for her newly diagnosed left breast cancer.    Patient is accompained by:  Family member    Pertinent History  Patient was diagnosed on 02/18/18 with left grade 2 invasive ductal carcinoma breast cancer. It measures 1.2 cm and is located in the lower inner quadrant. It is ER/PR positive and HER2 negative with a Ki67 of 5%. She had a previous left shoulder scope in 2012. She had rheumatic fever as a child (PT informed her surgeon of  this).    Patient Stated Goals  Reduce lymphedema risk and learn post op shoulder ROM HEP    Currently in Pain?  No/denies         [facility] PT Assessment - 03/11/18 0001      Assessment   Medical Diagnosis  Left breast cancer    Referring Provider  Dr. Stark Klein    Onset Date/Surgical Date  02/18/18    Hand Dominance  Right    Prior Therapy  none      Precautions   Precautions  Other (comment)    Precaution Comments  active cancer      Restrictions   Weight Bearing Restrictions  No      Balance Screen   Has the patient fallen in the past 6 months  Yes    How many times?  1 Tripped while carying things at work; not a balance    Has the patient had a decrease in activity level because of a fear of falling?   No    Is the patient reluctant to leave their home because of a fear of falling?   No      Home Environment   Living Environment  Private residence    Living Arrangements  Other relatives Sister    Available Help at Discharge  Family  Prior Function   Level of Independence  Independent    Vocation  Part time employment    Scientist, forensic associate at [employer]  She does not exercise      Cognition   Overall Cognitive Status  Within Functional Limits for tasks assessed      Posture/Postural Control   Posture/Postural Control  Postural limitations    Postural Limitations  Rounded Shoulders;Forward head      ROM / Strength   AROM / PROM / Strength  AROM;Strength      AROM   AROM Assessment Site  Shoulder;Cervical    Right/Left Shoulder  Right;Left    Right Shoulder Extension  60 Degrees    Right Shoulder Flexion  128 Degrees    Right Shoulder ABduction  146 Degrees    Right Shoulder Internal Rotation  77 Degrees    Right Shoulder External Rotation  83 Degrees    Left Shoulder Extension  55 Degrees    Left Shoulder Flexion  124 Degrees    Left Shoulder ABduction  140 Degrees    Left Shoulder Internal Rotation  60  Degrees    Left Shoulder External Rotation  85 Degrees    Cervical Flexion  WNL    Cervical Extension  WNL    Cervical - Right Side Bend  50% limited    Cervical - Left Side Bend  50% limited    Cervical - Right Rotation  WNL    Cervical - Left Rotation  50% limited      Strength   Overall Strength  Within functional limits for tasks performed        LYMPHEDEMA/ONCOLOGY QUESTIONNAIRE - 03/11/18 1149      Type   Cancer Type  Left breast cancer      Lymphedema Assessments   Lymphedema Assessments  Upper extremities      Right Upper Extremity Lymphedema   10 cm Proximal to Olecranon Process  24.7 cm    Olecranon Process  22 cm    10 cm Proximal to Ulnar Styloid Process  18.5 cm    Just Proximal to Ulnar Styloid Process  14.2 cm    Across Hand at [employer]  19.1 cm    At [place] of 2nd Digit  6.4 cm      Left Upper Extremity Lymphedema   10 cm Proximal to Olecranon Process  24.8 cm    Olecranon Process  22.1 cm    10 cm Proximal to Ulnar Styloid Process  18.5 cm    Just Proximal to Ulnar Styloid Process  14.5 cm    Across Hand at [employer]  18.8 cm    At [place] of 2nd Digit  6 cm           No data recorded  Objective measurements completed on examination: See above findings.     Patient was instructed today in a home exercise program today for post op shoulder range of motion. These included active assist shoulder flexion in sitting, scapular retraction, wall walking with shoulder abduction, and hands behind head external rotation.  She was encouraged to do these twice a day, holding 3 seconds and repeating 5 times when permitted by her physician.      PT Education - 03/11/18 1150    Education provided  Yes    Education Details  Lymphedema risk reduction and post op shoulder ROM HEP    Person(s) Educated  Patient;Child(ren)  Methods  Explanation;Demonstration;Handout    Comprehension  Returned demonstration;Verbalized understanding          PT  Long Term Goals - 03/11/18 1157      PT LONG TERM GOAL #1   Title  Patient will demonstrate she has regained shoulder function and ROM post opreatively back to baseline.    Time  [facility] Goals - 03/11/18 1157      Patient will be able to verbalize understanding of pertinent lymphedema risk reduction practices relevant to her diagnosis specifically related to skin care.   Time  1    Period  Days    Status  Achieved      Patient will be able to return demonstrate and/or verbalize understanding of the post-op home exercise program related to regaining shoulder range of motion.   Time  1    Period  Days    Status  Achieved      Patient will be able to verbalize understanding of the importance of attending the postoperative After Breast Cancer Class for further lymphedema risk reduction education and therapeutic exercise.   Time  1    Period  Days    Status  Achieved            Plan - 03/11/18 1151    Clinical Impression Statement  Patient was diagnosed on 02/18/18 with left grade 2 invasive ductal carcinoma breast cancer. It measures 1.2 cm and is located in the lower inner quadrant. It is ER/PR positive and HER2 negative with a Ki67 of 5%. She had a previous left shoulder scope in 2012. She had rheumatic fever as a child (PT informed her surgeon of this). Her multidisciplinary medical team met prior to her assessments to determine a recommended treatment plan. She is planning to have genetic testing, a left lumpectomy and sentinel node biopsy, possibly radiation, and anti-estrogen therapy.    History and Personal Factors relevant to plan of care:  Previous left shoulder surgery; lives with sister but her sister has caregiver as she is in a wheelchair    Clinical Presentation  Stable    Clinical Decision Making  Low    Rehab Potential  Excellent    Clinical Impairments Affecting Rehab Potential  None    PT Frequency  -- Eval and 1 f/u  visit    PT Treatment/Interventions  ADLs/Self Care Home Management;Therapeutic exercise;Patient/family education    PT Next Visit Plan  No f/u with pt 3-4 weeks post op to reassess and determine PT needs    PT Home Exercise Plan  Post op shoulder ROM HEP    Consulted and Agree with Plan of Care  Patient;Family member/caregiver    Family Member Consulted  Son       Patient will benefit from skilled therapeutic intervention in order to improve the following deficits and impairments:  Decreased knowledge of precautions, Impaired UE functional use, Decreased range of motion, Postural dysfunction, Pain  Visit Diagnosis: Malignant neoplasm of lower-inner quadrant of left breast in female, estrogen receptor positive ([place]) - Plan: PT plan of care cert/re-cert  Abnormal posture - Plan: PT plan of care cert/re-cert   Patient will follow up at outpatient cancer rehab 3-4 weeks following surgery.  If the patient requires physical therapy at that time, a specific plan will be dictated and sent to the referring physician for approval. The patient was educated today on  appropriate basic range of motion exercises to begin post operatively and the importance of attending the After Breast Cancer class following surgery.  Patient was educated today on lymphedema risk reduction practices as it pertains to recommendations that will benefit the patient immediately following surgery.  She verbalized good understanding.      Problem List Patient Active Problem List   Diagnosis Date Noted  . Malignant neoplasm of lower-inner quadrant of left breast in female, estrogen receptor positive ([place]) 03/06/2018  . Osteopenia 02/18/2018  . COPD, moderate ([place]) 11/06/2015  . CAD (coronary artery disease), native coronary artery   . Bronchiectasis with (acute) exacerbation ([place]) 06/09/2015  . History of smoking 25-50 pack years 06/09/2015  . Atherosclerosis of aorta ([place]) 02/02/2015  . Hiatal hernia 02/02/2015  .  Emphysema of lung ([place]) 02/02/2015  . Depressive disorder, not elsewhere classified 12/04/2011  . Vitamin D deficiency 02/14/2011    Annia Friendly, PT 03/11/18 11:59 AM  [place], [place], [address] Phone: [phone]   Fax:  [phone]  Name: LEIALOHA HANNA MRN: [id number] Date of Birth: March 30, 1941

## 2018-03-11 NOTE — Progress Notes (Signed)
Nutrition Assessment  Reason for Assessment:  Pt seen in Breast Clinic  ASSESSMENT:   77 year old female with new diagnosis of breast cancer.  Past medical history reviewed.    Patient reports normal appetite  Medications:  reviewed  Labs: reviewed  Anthropometrics:   Height: 68.5 inches Weight: 140 lb BMI: 20   NUTRITION DIAGNOSIS: Food and nutrition related knowledge deficit related to new diagnosis of breast cancer as evidenced by no prior need for nutrition related information.  INTERVENTION:   Discussed and provided packet of information regarding nutritional tips for breast cancer patients.  Questions answered.  Teachback method used.  Contact information provided and patient knows to contact me with questions/concerns.    MONITORING, EVALUATION, and GOAL: Pt will consume a healthy plant based diet to maintain lean body mass throughout treatment.   Molly Ross B. Zenia Resides, [place], [place] Registered Dietitian [phone] (pager)

## 2018-03-11 NOTE — Patient Instructions (Signed)

## 2018-03-12 NOTE — Progress Notes (Signed)
[facility] CONSULT NOTE  Patient Care Team: Rita Ohara, MD as PCP - General (Family Medicine) Stark Klein, MD as Consulting Physician (General Surgery) Nicholas Lose, MD as Consulting Physician (Hematology and Oncology) Eppie Gibson, MD as Attending Physician (Radiation Oncology)  CHIEF COMPLAINTS/PURPOSE OF CONSULTATION:  Newly diagnosed breast cancer  HISTORY OF PRESENTING ILLNESS:  Molly Ross 77 y.o. female is here because of recent diagnosis of left breast cancer.  Patient had a routine screening mammogram that detected a left breast mass at 8 o'clock position 1.2 cm in size in the lower inner quadrant.  Axilla was negative.  Biopsy of the mass revealed grade 2 invasive ductal carcinoma that was ER PR positive HER-2 negative with a Ki-67 of 5%.  She was presented this morning in the multidisciplinary tumor board and she is here today accompanied by her family to discuss the treatment plan.  I reviewed her records extensively and collaborated the history with the patient.  SUMMARY OF ONCOLOGIC HISTORY:   Malignant neoplasm of lower-inner quadrant of left breast in female, estrogen receptor positive ([place])   03/06/2018 Initial Diagnosis    Screening detected left breast mass at 8 o'clock position 1.2 cm in size lower inner quadrant axilla is negative biopsy revealed grade 2 invasive ductal carcinoma ER 100%, PR 90%, Ki-67 5%, HER-2 negative, T1 be N0 stage I a clinical stage       MEDICAL HISTORY:  Past Medical History:  Diagnosis Date  . Bronchiectasis ([place])   . Colon polyp   . Shingles 1/09 and 07/2010  . Unspecified vitamin D deficiency 02/2011 and 2016    SURGICAL HISTORY: Past Surgical History:  Procedure Laterality Date  . CATARACT EXTRACTION, BILATERAL Bilateral 03/2017, 04/2017   [facility] [place], Dr. Alanda Slim  . SHOULDER SURGERY  03/2011   for frozen shoulder, and cyst removal (Dr. Percell Miller)  . TONSILLECTOMY  child  . VIDEO BRONCHOSCOPY Bilateral 06/21/2016    Procedure: VIDEO BRONCHOSCOPY WITHOUT FLUORO;  Surgeon: Brand Males, MD;  Location: [facility];  Service: Cardiopulmonary;  Laterality: Bilateral;    SOCIAL HISTORY: Social History   Socioeconomic History  . Marital status: Widowed    Spouse name: Not on file  . Number of children: Not on file  . Years of education: Not on file  . Highest education level: Not on file  Occupational History  . Occupation: [employer]  Social Needs  . Financial resource strain: Not on file  . Food insecurity:    Worry: Not on file    Inability: Not on file  . Transportation needs:    Medical: Not on file    Non-medical: Not on file  Tobacco Use  . Smoking status: Former Smoker    Packs/day: 2.00    Years: 24.00    Pack years: 48.00    Types: Cigarettes    Last attempt to quit: 06/28/1980    Years since quitting: 37.7  . Smokeless tobacco: Never Used  . Tobacco comment: smoked 1-2 PPD x 24 years;  Substance and Sexual Activity  . Alcohol use: Yes    Alcohol/week: 0.0 oz    Comment: 1 glass of wine once a week (0-3)  . Drug use: No  . Sexual activity: Not Currently  Lifestyle  . Physical activity:    Days per week: Not on file    Minutes per session: Not on file  . Stress: Not on file  Relationships  . Social connections:    Talks on phone:  Not on file    Gets together: Not on file    Attends religious service: Not on file    Active member of club or organization: Not on file    Attends meetings of clubs or organizations: Not on file    Relationship status: Not on file  . Intimate partner violence:    Fear of current or ex partner: Not on file    Emotionally abused: Not on file    Physically abused: Not on file    Forced sexual activity: Not on file  Other Topics Concern  . Not on file  Social History Narrative   Lives with [name] (her sister).  Widowed (2011).  Son lives in [place], granddaughter lives in [place] (with her mother--gets to see her when  she volunteers at [employer] on Saturdays).   Also has step grandson.   She works at the [employer] in [place].   Decreased to 3d/week in 08/2017.    FAMILY HISTORY: Family History  Problem Relation Age of Onset  . Stroke Mother   . Diabetes Mother   . Stroke Sister 45       brainstem stroke  . Diabetes Sister   . Seizures Sister   . Lung cancer Father   . Ovarian cancer Sister 63       metastatic to lung, liver  . Cancer Sister   . Ovarian cancer Maternal Aunt   . Breast cancer Neg Hx     ALLERGIES:  has No Known Allergies.  MEDICATIONS:  Current Outpatient Medications  Medication Sig Dispense Refill  . aspirin EC 81 MG tablet Take 81 mg by mouth daily.    Marland Kitchen atorvastatin (LIPITOR) 20 MG tablet Take 1 tablet (20 mg total) by mouth daily. 90 tablet 1  . Calcium-Magnesium-Vitamin D (CALCIUM 500 PO) Take 1 tablet by mouth daily.    . fish oil-omega-3 fatty acids 1000 MG capsule Take 3 g by mouth daily.     . Fluticasone Furoate-Vilanterol (BREO ELLIPTA) 100-25 MCG/INH AEPB Inhale 1 puff into the lungs daily. 60 each 0  . Multiple Vitamins-Minerals (MULTIVITAMIN WITH MINERALS) tablet Take 1 tablet by mouth daily.    . Tiotropium Bromide Monohydrate (SPIRIVA RESPIMAT) 2.5 MCG/ACT AERS Inhale 2 puffs into the lungs 2 (two) times daily. 1 Inhaler 5  . VENTOLIN HFA 108 (90 Base) MCG/ACT inhaler INHALE 2 PUFFS INTO THE LUNGS EVERY 6 HOURS AS NEEDED FOR WHEEZING OR SHORTNESS OF BREATH. 18 Inhaler 3   No current facility-administered medications for this visit.     REVIEW OF SYSTEMS:   Constitutional: Denies fevers, chills or abnormal night sweats Eyes: Denies blurriness of vision, double vision or watery eyes Ears, nose, mouth, throat, and face: Denies mucositis or sore throat Respiratory: Denies cough, dyspnea or wheezes Cardiovascular: Denies palpitation, chest discomfort or lower extremity swelling Gastrointestinal:  Denies nausea, heartburn or change in bowel  habits Skin: Denies abnormal skin rashes Lymphatics: Denies new lymphadenopathy or easy bruising Neurological:Denies numbness, tingling or new weaknesses Behavioral/Psych: Mood is stable, no new changes  Breast:  Denies any palpable lumps or discharge All other systems were reviewed with the patient and are negative.  PHYSICAL EXAMINATION: ECOG PERFORMANCE STATUS: 1 - Symptomatic but completely ambulatory  Vitals:   03/11/18 0907  BP: (!) 143/73  Pulse: 71  Resp: 17  Temp: 97.7 F (36.5 C)  SpO2: 100%   Filed Weights   03/11/18 0907  Weight: 140 lb 1.6 oz (63.5 kg)  GENERAL:alert, no distress and comfortable SKIN: skin color, texture, turgor are normal, no rashes or significant lesions EYES: normal, conjunctiva are pink and non-injected, sclera clear OROPHARYNX:no exudate, no erythema and lips, buccal mucosa, and tongue normal  NECK: supple, thyroid normal size, non-tender, without nodularity LYMPH:  no palpable lymphadenopathy in the cervical, axillary or inguinal LUNGS: clear to auscultation and percussion with normal breathing effort HEART: regular rate & rhythm and no murmurs and no lower extremity edema ABDOMEN:abdomen soft, non-tender and normal bowel sounds Musculoskeletal:no cyanosis of digits and no clubbing  PSYCH: alert & oriented x 3 with fluent speech NEURO: no focal motor/sensory deficits BREAST: No palpable nodules in breast. No palpable axillary or supraclavicular lymphadenopathy (exam performed in the presence of a chaperone)   LABORATORY DATA:  I have reviewed the data as listed Lab Results  Component Value Date   WBC 8.1 03/11/2018   HGB 12.8 05/16/2017   HCT 39.9 03/11/2018   MCV 91.7 03/11/2018   PLT 311 03/11/2018   Lab Results  Component Value Date   NA 141 03/11/2018   K 4.2 03/11/2018   CL 104 03/11/2018   CO2 28 03/11/2018    RADIOGRAPHIC STUDIES: I have personally reviewed the radiological reports and agreed with the findings in  the report.  ASSESSMENT AND PLAN:  Malignant neoplasm of lower-inner quadrant of left breast in female, estrogen receptor positive ([place]) 03/06/2018:Screening detected left breast mass at 8 o'clock position 1.2 cm in size lower inner quadrant axilla is negative biopsy revealed grade 2 invasive ductal carcinoma ER 100%, PR 90%, Ki-67 5%, HER-2 negative, T1 be N0 stage I a clinical stage  Pathology and radiology counseling: Discussed with the patient, the details of pathology including the type of breast cancer,the clinical staging, the significance of ER, PR and HER-2/neu receptors and the implications for treatment. After reviewing the pathology in detail, we proceeded to discuss the different treatment options between surgery, radiation, chemotherapy, antiestrogen therapies.  Recommendation: 1.  Breast conserving surgery with sentinel lymph node biopsy  2. radiation oncology discussed the pros and cons of radiation and determined that she does not get much benefit from radiation therapy 3 followed by antiestrogen therapy with aromatase inhibitors letrozole 2.5 mg daily times 5 years  Patient will need genetics consult because her sister had ovarian cancer.  Return to clinic after surgery to discuss the final pathology report.    All questions were answered. The patient knows to call the clinic with any problems, questions or concerns.    Harriette Ohara, MD 03/12/18

## 2018-03-12 NOTE — Assessment & Plan Note (Signed)
03/06/2018:Screening detected left breast mass at 8 o'clock position 1.2 cm in size lower inner quadrant axilla is negative biopsy revealed grade 2 invasive ductal carcinoma ER 100%, PR 90%, Ki-67 5%, HER-2 negative, T1 be N0 stage I a clinical stage  Pathology and radiology counseling: Discussed with the patient, the details of pathology including the type of breast cancer,the clinical staging, the significance of ER, PR and HER-2/neu receptors and the implications for treatment. After reviewing the pathology in detail, we proceeded to discuss the different treatment options between surgery, radiation, chemotherapy, antiestrogen therapies.  Recommendation: 1.  Breast conserving surgery with sentinel lymph node biopsy  2. radiation oncology discussed the pros and cons of radiation and determined that she does not get much benefit from radiation therapy 3 followed by antiestrogen therapy with aromatase inhibitors letrozole 2.5 mg daily times 5 years  Patient will need genetics consult because her sister had ovarian cancer.  Return to clinic after surgery to discuss the final pathology report.

## 2018-03-13 ENCOUNTER — Other Ambulatory Visit: Payer: Self-pay | Admitting: General Surgery

## 2018-03-13 ENCOUNTER — Inpatient Hospital Stay (HOSPITAL_BASED_OUTPATIENT_CLINIC_OR_DEPARTMENT_OTHER): Payer: Medicare Other | Admitting: Genetics

## 2018-03-13 ENCOUNTER — Other Ambulatory Visit: Payer: Self-pay

## 2018-03-13 ENCOUNTER — Inpatient Hospital Stay: Payer: Medicare Other

## 2018-03-13 ENCOUNTER — Encounter: Payer: Self-pay | Admitting: Genetics

## 2018-03-13 DIAGNOSIS — Z801 Family history of malignant neoplasm of trachea, bronchus and lung: Secondary | ICD-10-CM | POA: Diagnosis not present

## 2018-03-13 DIAGNOSIS — Z170 Estrogen receptor positive status [ER+]: Principal | ICD-10-CM

## 2018-03-13 DIAGNOSIS — C50312 Malignant neoplasm of lower-inner quadrant of left female breast: Secondary | ICD-10-CM

## 2018-03-13 DIAGNOSIS — Z8041 Family history of malignant neoplasm of ovary: Secondary | ICD-10-CM | POA: Insufficient documentation

## 2018-03-13 DIAGNOSIS — Z8042 Family history of malignant neoplasm of prostate: Secondary | ICD-10-CM | POA: Insufficient documentation

## 2018-03-13 DIAGNOSIS — Z006 Encounter for examination for normal comparison and control in clinical research program: Secondary | ICD-10-CM

## 2018-03-13 LAB — RESEARCH LABS

## 2018-03-13 NOTE — Progress Notes (Signed)
REFERRING PROVIDER: Nicholas Lose, MD [address] [place], [place] [address]  PRIMARY PROVIDER:  Rita Ohara, MD  PRIMARY REASON FOR VISIT:  1. Malignant neoplasm of lower-inner quadrant of left breast in female, estrogen receptor positive ([place])   2. Family history of ovarian cancer   3. Family history of lung cancer   4. Family history of prostate cancer     HISTORY OF PRESENT ILLNESS:   Ms. Caspers, a 77 y.o. female, was seen for a [place] cancer genetics consultation at the request of Dr. Lindi Adie due to a personal and family history of cancer.  Ms. Stankus presents to clinic today to discuss the possibility of a hereditary predisposition to cancer, genetic testing, and to further clarify her future cancer risks, as well as potential cancer risks for family members.   On 03/06/2018, at the age of 71, Ms. 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  She is planning to have breast conserving surgery with sentinel lymph node biopsy followed by antiestrogen therapy.    CANCER HISTORY:    Malignant neoplasm of lower-inner quadrant of left breast in female, estrogen receptor positive ([place])   03/06/2018 Initial Diagnosis    Screening detected left breast mass at 8 o'clock position 1.2 cm in size lower inner quadrant axilla is negative biopsy revealed grade 2 invasive ductal carcinoma ER 100%, PR 90%, Ki-67 5%, HER-2 negative, T1 be N0 stage I a clinical stage        HORMONAL RISK FACTORS:  Menarche was at age 26-14.  First live birth at age 14.  Ovaries intact: yes.  Hysterectomy: no.  Menopausal status: postmenopausal.  HRT use: 6 years. Colonoscopy: yes; Nov 2018- 7 tubular adenomas were identified.  Told to come back in 2 years. Prior to this most recent colonoscopy she went every 5 years.  Mammogram within the last year: yes.   Past Medical History:  Diagnosis Date  . Bronchiectasis ([place])   . Colon polyp   .  Family history of lung cancer   . Family history of ovarian cancer   . Family history of prostate cancer   . Shingles 1/09 and 07/2010  . Unspecified vitamin D deficiency 02/2011 and 2016    Past Surgical History:  Procedure Laterality Date  . CATARACT EXTRACTION, BILATERAL Bilateral 03/2017, 04/2017   [facility], Dr. Alanda Slim  . SHOULDER SURGERY  03/2011   for frozen shoulder, and cyst removal (Dr. Percell Miller)  . TONSILLECTOMY  child  . VIDEO BRONCHOSCOPY Bilateral 06/21/2016   Procedure: VIDEO BRONCHOSCOPY WITHOUT FLUORO;  Surgeon: Brand Males, MD;  Location: [facility];  Service: Cardiopulmonary;  Laterality: Bilateral;    Social History   Socioeconomic History  . Marital status: Widowed    Spouse name: Not on file  . Number of children: Not on file  . Years of education: Not on file  . Highest education level: Not on file  Occupational History  . Occupation: [employer]  Social Needs  . Financial resource strain: Not on file  . Food insecurity:    Worry: Not on file    Inability: Not on file  . Transportation needs:    Medical: Not on file    Non-medical: Not on file  Tobacco Use  . Smoking status: Former Smoker    Packs/day: 2.00    Years: 24.00    Pack years: 48.00    Types: Cigarettes    Last attempt to quit: 06/28/1980  Years since quitting: 37.7  . Smokeless tobacco: Never Used  . Tobacco comment: smoked 1-2 PPD x 24 years;  Substance and Sexual Activity  . Alcohol use: Yes    Alcohol/week: 0.0 oz    Comment: 1 glass of wine once a week (0-3)  . Drug use: No  . Sexual activity: Not Currently  Lifestyle  . Physical activity:    Days per week: Not on file    Minutes per session: Not on file  . Stress: Not on file  Relationships  . Social connections:    Talks on phone: Not on file    Gets together: Not on file    Attends religious service: Not on file    Active member of club or organization: Not on file    Attends meetings of clubs or  organizations: Not on file    Relationship status: Not on file  Other Topics Concern  . Not on file  Social History Narrative   Lives with [name] (her sister).  Widowed (2011).  Son lives in [place], granddaughter lives in [place] (with her mother--gets to see her when she volunteers at [employer] on Saturdays).   Also has step grandson.   She works at the [employer] in [place].   Decreased to 3d/week in 08/2017.     FAMILY HISTORY:  We obtained a detailed, 4-generation family history.  Significant diagnoses are listed below: Family History  Problem Relation Age of Onset  . Stroke Mother   . Diabetes Mother   . Stroke Sister 71       brainstem stroke  . Diabetes Sister   . Seizures Sister   . Lung cancer Father 54  . Ovarian cancer Sister 51       metastatic to lung, liver  . Cancer Sister   . Ovarian cancer Maternal Aunt 95  . Heart disease Maternal Grandmother   . Prostate cancer Cousin   . Breast cancer Neg Hx    Ms. Lemma has a 57 year-old son with no history of cancer.  She has an 71 year-old granddaughter.  Ms. Andes has 3 sisters.  One was diagnosed with ovarian cancer at 64.  She reports she had genetic testing done, but does not know the result. She reports her sister is not doing very well with her cancer and she does not feel she can ask her about getting these results/getting permission for [place] to look up these results.  This sister has 3 sons and a daughter.  One of the sons had cancer in his 30's/40's  Ms. Urizar has 2 other sisters ages 24 and 46 with no history of cancer.  They do not have children.   Ms. Cadenhead' father died at 13 and had lung cancer.  Ms. Ammar has 3 paternal uncles who are deceased, 1 paternal aunt deceased, and 1 paternal aunt 110 years of age.  No history of cancer in these relatives.  No cancer in any of her paternal cousins.  Ms. Bramble' paternal grandfather died in mid-life due to an accident. Her paternal grandmother died in her  7's with no history of cancer.   Ms. Smigel' mother died at 42 and had a history of cervical cancer.  Ms. Colquhoun had 1 maternal aunt who died of ovarian cancer at 56.  This aunt had 3 sons and 1 daughter. One of the sons developed prostate cancer and is now in his 9's.  Ms. Aguiniga also has 1 maternal uncle with no  history of cancer. Ms. Haughton' maternal grandfather died in his 38's with no history of cancer.  Ms. Whidby' maternal grandmother died of heart disease- age unk.   Patient's maternal ancestors are of Northern European descent, and paternal ancestors are of Northern European descent. There is no reported Ashkenazi Jewish ancestry. There is no known consanguinity.  GENETIC COUNSELING ASSESSMENT: MACLAINE AHOLA is a 77 y.o. female with a personal and family history which is somewhat suggestive of a Hereditary Cancer Predisposition Syndrome. We, therefore, discussed and recommended the following at today's visit.   DISCUSSION: We reviewed the characteristics, features and inheritance patterns of hereditary cancer syndromes. We also discussed genetic testing, including the appropriate family members to test, the process of testing, insurance coverage and turn-around-time for results. We discussed the implications of a negative, positive and/or variant of uncertain significant result. We recommended Ms. Polasek pursue genetic testing for the Common Hereditary Cancers  gene panel. The Common Hereditary Cancer Panel offered by [facility] includes sequencing and/or deletion duplication testing of the following 47 genes: APC, ATM, AXIN2, BARD1, BMPR1A, BRCA1, BRCA2, BRIP1, CDH1, CDKN2A (p14ARF), CDKN2A (p16INK4a), CKD4, CHEK2, CTNNA1, DICER1, EPCAM (Deletion/duplication testing only), GREM1 (promoter region deletion/duplication testing only), KIT, MEN1, MLH1, MSH2, MSH3, MSH6, MUTYH, NBN, NF1, NHTL1, PALB2, PDGFRA, PMS2, POLD1, POLE, PTEN, RAD50, RAD51C, RAD51D, SDHB, SDHC, SDHD, SMAD4, SMARCA4. STK11, TP53, TSC1,  TSC2, and VHL.  The following genes were evaluated for sequence changes only: SDHA and HOXB13 c.251G>A variant only.  Ms. Presswood expressed that the results of this testing will not affect her upcoming breast surgical decision.  Therefore no STAT panel is needed.   We discussed that only 5-10% of cancers are associated with a Hereditary cancer predisposition syndrome.  One of the most common hereditary cancer syndromes that increases breast cancer risk is called Hereditary Breast and Ovarian Cancer (HBOC) syndrome.  This syndrome is caused by mutations in the BRCA1 and BRCA2 genes.  This syndrome increases an individual's lifetime risk to develop breast, ovarian, pancreatic, and other types of cancer.  There are also many other cancer predisposition syndromes caused by mutations in several other genes.  We discussed that if she is found to have a mutation in one of these genes, it may impact surgical decisions, and alter future medical management recommendations such as increased cancer screenings and consideration of risk reducing surgeries.  A positive result could also have implications for the patient's family members.  A Negative result would mean we were unable to identify a hereditary component to her cancer, but does not rule out the possibility of a hereditary basis for her cancer.  There could be mutations that are undetectable by current technology, or in genes not yet tested or identified to increase cancer risk.    We discussed the potential to find a Variant of Uncertain Significance or VUS.  These are variants that have not yet been identified as pathogenic or benign, and it is unknown if this variant is associated with increased cancer risk or if this is a normal finding.  Most VUS's are reclassified to benign or likely benign.   It should not be used to make medical management decisions. With time, we suspect the lab will determine the significance of any VUS's identified if any.   Based  on Ms. Somoza personal and family history of cancer, she meets medical criteria for genetic testing. Despite that she meets criteria, she may still have an out of pocket cost. We discussed that if her out  of pocket cost for testing is over $100, the laboratory will call and confirm whether she wants to proceed with testing.  If the out of pocket cost of testing is less than $100 she will be billed by the genetic testing laboratory.   PLAN: After considering the risks, benefits, and limitations, Ms. Canche  provided informed consent to pursue genetic testing and the blood sample was sent to [facility] for analysis of the Common Hereditary Cancers Panel. Results should be available within approximately 2-3 weeks' time, at which point they will be disclosed by telephone to Ms. Napolitano, as will any additional recommendations warranted by these results. Ms. Willis will receive a summary of her genetic counseling visit and a copy of her results once available. This information will also be available in Epic. We encouraged Ms. Catano to remain in contact with cancer genetics annually so that we can continuously update the family history and inform her of any changes in cancer genetics and testing that may be of benefit for her family. Ms. Rao questions were answered to her satisfaction today. Our contact information was provided should additional questions or concerns arise.  Based on Ms. Tietje family history, we recommended her siblings and maternal relatives, have genetic counseling and testing. Ms. Mahn will let us know if we can be of any assistance in coordinating genetic counseling and/or testing for this family member.   Lastly, we encouraged Ms. Glaus to remain in contact with cancer genetics annually so that we can continuously update the family history and inform her of any changes in cancer genetics and testing that may be of benefit for this family.   Ms.  Batchelder questions were answered to  her satisfaction today. Our contact information was provided should additional questions or concerns arise. Thank you for the referral and allowing [place] to share in the care of your patient.   Tana Felts, MS, [facility] Certified Genetic Counselor [email or web address] phone: [phone]  The patient was seen for a total of 30 minutes in face-to-face genetic counseling. This patient was discussed with Drs. Magrinat, Lindi Adie and/or Burr Medico who agrees with the above.

## 2018-03-16 ENCOUNTER — Telehealth: Payer: Self-pay | Admitting: *Deleted

## 2018-03-16 ENCOUNTER — Encounter (HOSPITAL_BASED_OUTPATIENT_CLINIC_OR_DEPARTMENT_OTHER): Payer: Self-pay | Admitting: *Deleted

## 2018-03-16 ENCOUNTER — Other Ambulatory Visit: Payer: Self-pay

## 2018-03-16 ENCOUNTER — Telehealth: Payer: Self-pay | Admitting: Internal Medicine

## 2018-03-16 NOTE — Telephone Encounter (Signed)
Left vm concerning BMDC from 3.27.19. Contact information provided for questions or needs.

## 2018-03-16 NOTE — Telephone Encounter (Signed)
Called pt letting her know we needed her to come in for an OV since we hadn't seen her since 05/16/17.  Pt expressed understanding. ROV scheduled for pt with TP tomorrow, 03/17/18 at 9:15.  Stated to pt we could give her samples at that visit.  Nothing further needed at this time.

## 2018-03-17 ENCOUNTER — Encounter: Payer: Self-pay | Admitting: Adult Health

## 2018-03-17 ENCOUNTER — Ambulatory Visit (INDEPENDENT_AMBULATORY_CARE_PROVIDER_SITE_OTHER)
Admission: RE | Admit: 2018-03-17 | Discharge: 2018-03-17 | Disposition: A | Discharge status: Home / Self Care | Payer: Medicare Other | Source: Ambulatory Visit | Attending: Adult Health | Admitting: Adult Health

## 2018-03-17 ENCOUNTER — Telehealth: Payer: Self-pay | Admitting: Hematology and Oncology

## 2018-03-17 ENCOUNTER — Ambulatory Visit: Payer: Medicare Other | Admitting: Adult Health

## 2018-03-17 ENCOUNTER — Ambulatory Visit
Admission: RE | Admit: 2018-03-17 | Discharge: 2018-03-17 | Disposition: A | Discharge status: Home / Self Care | Payer: Medicare Other | Source: Ambulatory Visit | Attending: General Surgery | Admitting: General Surgery

## 2018-03-17 DIAGNOSIS — J471 Bronchiectasis with (acute) exacerbation: Secondary | ICD-10-CM | POA: Diagnosis not present

## 2018-03-17 DIAGNOSIS — C50312 Malignant neoplasm of lower-inner quadrant of left female breast: Secondary | ICD-10-CM

## 2018-03-17 DIAGNOSIS — J449 Chronic obstructive pulmonary disease, unspecified: Secondary | ICD-10-CM

## 2018-03-17 DIAGNOSIS — Z170 Estrogen receptor positive status [ER+]: Principal | ICD-10-CM

## 2018-03-17 DIAGNOSIS — A312 Disseminated mycobacterium avium-intracellulare complex (DMAC): Secondary | ICD-10-CM | POA: Diagnosis not present

## 2018-03-17 MED ORDER — FLUTICASONE FUROATE-VILANTEROL 100-25 MCG/INH IN AEPB: 1.0000 | INHALATION_SPRAY | Freq: Every day | RESPIRATORY_TRACT | 5 refills | Status: DC

## 2018-03-17 MED ORDER — TIOTROPIUM BROMIDE MONOHYDRATE 2.5 MCG/ACT IN AERS: 2.0000 | INHALATION_SPRAY | Freq: Two times a day (BID) | RESPIRATORY_TRACT | 5 refills | Status: DC

## 2018-03-17 MED ORDER — FLUTICASONE FUROATE-VILANTEROL 100-25 MCG/INH IN AEPB: 1.0000 | INHALATION_SPRAY | Freq: Every day | RESPIRATORY_TRACT | 0 refills | Status: DC

## 2018-03-17 NOTE — Telephone Encounter (Signed)
Mailed patient calendar of upcoming April appointments per 4/1 sch message

## 2018-03-17 NOTE — Assessment & Plan Note (Signed)
Controlled w/out flare   Plan  Patient Instructions  Continue on BREO 1 puff daily , rinse after use  Chest xray today .  Follow up with Dr. Chase Caller in 1 year and As needed

## 2018-03-17 NOTE — Pre-Procedure Instructions (Signed)
Patient instructed on CHG bath. Also given pre surgery Ensure with instructions to complete by 0500 DOS

## 2018-03-17 NOTE — Assessment & Plan Note (Signed)
Continue on BREO . Appears well controlled  May remain off [place] cxr today .   Plan  Patient Instructions  Continue on BREO 1 puff daily , rinse after use  Chest xray today .  Follow up with Dr. Chase Caller in 1 year and As needed

## 2018-03-17 NOTE — Patient Instructions (Signed)
Continue on BREO 1 puff daily , rinse after use  Chest xray today .  Follow up with Dr. Chase Caller in 1 year and As needed

## 2018-03-17 NOTE — Progress Notes (Signed)
LMOMTCB x 1 

## 2018-03-17 NOTE — Progress Notes (Signed)
'@Patient'  ID: Molly Ross, female    DOB: February 01, 1941, 77 y.o.   MRN: [id number]  Chief Complaint  Patient presents with  . Follow-up    Bronchiectasis     Referring provider: Rita Ohara, MD  HPI: 77 year old female former smoker followed for COPD and Bronchiectasis   Significant tests/ events reviewed  10/30/2015) baseline and well. Post broncho-dilator FEV1 1.36 L/51% and a ratio 60, total lung capacity 5.2 L/90%, DLCO 16/51%   03/17/2018 Follow up : Bronchiectasis /COPD  Returns for a follow-up for bronchiectasis/COPD .  Says that she has been doing well over the last year.  She had no increased cough or congestion.  No recent antibiotic use.  She denies any shortness of breath, hemoptysis, weight loss, edema.  She does have an occasional cough that is minimally productive.  She is very active and independent.  She remains on BREO daily .,  Does depend on samples.  Has not taken Spiriva in >1 year. Has not felt that she needed this.  Recently diagnosed left breast cancer. Plans for surgery later this week.   No Known Allergies  Immunization History  Administered Date(s) Administered  . Influenza, High Dose Seasonal PF 01/26/2014, 10/19/2014, 08/30/2015, 08/08/2016, 08/05/2017  . Pneumococcal Conjugate-13 12/25/2015  . Pneumococcal Polysaccharide-23 12/30/2016  . Tdap 09/03/2017    Past Medical History:  Diagnosis Date  . Bronchiectasis ([place])   . Cancer ([place])   . Colon polyp   . Dyspnea   . Family history of lung cancer   . Family history of ovarian cancer   . Family history of prostate cancer   . GERD (gastroesophageal reflux disease)   . Shingles 1/09 and 07/2010  . Unspecified vitamin D deficiency 02/2011 and 2016    Tobacco History: Social History   Tobacco Use  Smoking Status Former Smoker  . Packs/day: 2.00  . Years: 24.00  . Pack years: 48.00  . Types: Cigarettes  . Last attempt to quit: 06/28/1980  . Years since quitting: 37.7  Smokeless  Tobacco Never Used  Tobacco Comment   smoked 1-2 PPD x 24 years;   Counseling given: Not Answered Comment: smoked 1-2 PPD x 24 years;   Outpatient Encounter Medications as of 03/17/2018  Medication Sig  . aspirin EC 81 MG tablet Take 81 mg by mouth daily.  Marland Kitchen atorvastatin (LIPITOR) 20 MG tablet Take 1 tablet (20 mg total) by mouth daily.  . Calcium-Magnesium-Vitamin D (CALCIUM 500 PO) Take 1 tablet by mouth daily.  . fish oil-omega-3 fatty acids 1000 MG capsule Take 3 g by mouth daily.   . Fluticasone Furoate-Vilanterol (BREO ELLIPTA) 100-25 MCG/INH AEPB Inhale 1 puff into the lungs daily.  . Multiple Vitamins-Minerals (MULTIVITAMIN WITH MINERALS) tablet Take 1 tablet by mouth daily.  . Tiotropium Bromide Monohydrate (SPIRIVA RESPIMAT) 2.5 MCG/ACT AERS Inhale 2 puffs into the lungs 2 (two) times daily.  . VENTOLIN HFA 108 (90 Base) MCG/ACT inhaler INHALE 2 PUFFS INTO THE LUNGS EVERY 6 HOURS AS NEEDED FOR WHEEZING OR SHORTNESS OF BREATH.  . [DISCONTINUED] Tiotropium Bromide Monohydrate (SPIRIVA RESPIMAT) 2.5 MCG/ACT AERS Inhale 2 puffs into the lungs 2 (two) times daily.   No facility-administered encounter medications on file as of 03/17/2018.      Review of Systems  Constitutional:   No  weight loss, night sweats,  Fevers, chills, fatigue, or  lassitude.  HEENT:   No headaches,  Difficulty swallowing,  Tooth/dental problems, or  Sore throat,  No sneezing, itching, ear ache, nasal congestion, post nasal drip,   CV:  No chest pain,  Orthopnea, PND, swelling in lower extremities, anasarca, dizziness, palpitations, syncope.   GI  No heartburn, indigestion, abdominal pain, nausea, vomiting, diarrhea, change in bowel habits, loss of appetite, bloody stools.   Resp: No shortness of breath with exertion or at rest.  No excess mucus, no productive cough,    No coughing up of blood.  No change in color of mucus.  No wheezing.  No chest wall deformity  Skin: no rash or  lesions.  GU: no dysuria, change in color of urine, no urgency or frequency.  No flank pain, no hematuria   MS:  No joint pain or swelling.  No decreased range of motion.  No back pain.    Physical Exam  BP 120/68 (BP Location: Left Arm, Cuff Size: Normal)   Pulse 77   Ht '5\' 9"'  (1.753 m)   Wt 141 lb (64 kg)   SpO2 96%   BMI 20.82 kg/m   GEN: A/Ox3; pleasant , NAD, well nourished    HEENT:  [place]/AT,  EACs-clear, TMs-wnl, NOSE-clear, THROAT-clear, no lesions, no postnasal drip or exudate noted.   NECK:  Supple w/ fair ROM; no JVD; normal carotid impulses w/o bruits; no thyromegaly or nodules palpated; no lymphadenopathy.    RESP few trace rhonchi  no accessory muscle use, no dullness to percussion  Ross:  RRR, no m/r/g, no peripheral edema, pulses intact, no cyanosis or clubbing.  GI:   Soft & nt; nml bowel sounds; no organomegaly or masses detected.   Musco: Warm bil, no deformities or joint swelling noted.   Neuro: alert, no focal deficits noted.    Skin: Warm, no lesions or rashes    Lab Results:  CBC    Component Value Date/Time   WBC 8.1 03/11/2018 0848   WBC 18.7 Repeated and verified X2. (HH) 05/16/2017 1149   RBC 4.35 03/11/2018 0848   HGB 12.8 05/16/2017 1149   HCT 39.9 03/11/2018 0848   PLT 311 03/11/2018 0848   MCV 91.7 03/11/2018 0848   MCH 29.4 03/11/2018 0848   MCHC 32.1 03/11/2018 0848   RDW 13.6 03/11/2018 0848   LYMPHSABS 1.8 03/11/2018 0848   MONOABS 0.9 03/11/2018 0848   EOSABS 0.1 03/11/2018 0848   BASOSABS 0.1 03/11/2018 0848    BMET    Component Value Date/Time   NA 141 03/11/2018 0848   K 4.2 03/11/2018 0848   CL 104 03/11/2018 0848   CO2 28 03/11/2018 0848   GLUCOSE 91 03/11/2018 0848   BUN 21 03/11/2018 0848   CREATININE 0.69 03/11/2018 0848   CREATININE 0.56 (L) 09/01/2017 1307   CALCIUM 10.4 03/11/2018 0848   GFRNONAA >60 03/11/2018 0848   GFRAA >60 03/11/2018 0848    BNP    Component Value Date/Time   BNP 20.3  07/28/2013 1647    ProBNP No results found for: PROBNP  Imaging: Dg Bone Density  Result Date: 02/18/2018 EXAM: DUAL X-RAY ABSORPTIOMETRY (DXA) FOR BONE MINERAL DENSITY IMPRESSION: Referring Physician:  EVE KNAPP PATIENT: Name: Nydia, Ytuarte Patient ID: [id number] Birth Date: [birth year] Height: 68.5 in. Sex: Female Measured: 02/18/2018 Weight: 140.8 lbs. Indications: Advanced Age, Caucasian, Estrogen Deficiency, Postmenopausal, Previous Smoker Fractures: Foot, Wrist Treatments: Calcium, HRT, Vitamin D ASSESSMENT: The BMD measured at Femur Neck Right is 0.781 g/cm2 with a T-score of -1.8. This patient is considered osteopenic according to [facility]) criteria. Site Region Measured  Date Measured Age WHO YA BMD Classification T-score AP Spine L1-L4 02/18/2018 [age] Osteopenia -1.2 1.050 g/cm2 DualFemur Neck Right 2/18/2018 [age] Osteopenia -1.8 0.781 g/cm2 World Health Organization [facility]) criteria for post-menopausal, Caucasian Women: Normal       T-score at or above -1 SD Osteopenia   T-score between -1 and -2.5 SD Osteoporosis T-score at or below -2.5 SD RECOMMENDATION: [place] recommends that FDA-approved medical therapies be considered in postmenopausal women and men age 40 or older with a: 1. Hip or vertebral (clinical or morphometric) fracture. 2. T-score of <-2.5 at the spine or hip. 3. Ten-year fracture probability by FRAX of 3% or greater for hip fracture or 20% or greater for major osteoporotic fracture. All treatments decisions require clinical judgment and consideration of individual patient factors, including patient preferences, co-morbidities, previous drug use, risk factors not captured in the FRAX model (e.g. falls, vitamin D deficiency, increased bone turnover, interval significant decline in bone density) and possible under - or over-estimation of fracture risk by FRAX. All patients should ensure an adequate intake of dietary calcium (1200 mg/d)  and vitamin D (800 IU daily) unless contraindicated. FOLLOW-UP: People with diagnosed cases of osteoporosis or at high risk for fracture should have regular bone mineral density tests. For patients eligible for Medicare, routine testing is allowed once every 2 years. The testing frequency can be increased to one year for patients who have rapidly progressing disease, those who are receiving or discontinuing medical therapy to restore bone mass, or have additional risk factors. I have reviewed this report and agree with the above findings. [facility] Patient: Molly Ross Referring Physician: Rita Ohara Birth Date: 09-Aug-1941 [age] Patient ID: [id number] Height: 68.5 in. Weight: 140.8 lbs. Measured: 02/18/2018 11:10:39 AM (16 SP 2) Sex: Female Ethnicity: White Analyzed: 02/18/2018 11:14:31 AM (16 SP 2) FRAX* 10-year Probability of Fracture Based on femoral neck BMD: DualFemur (Right) Major Osteoporotic Fracture: 11.8% Hip Fracture:                3.1% Population:                  [place] (Caucasian) Risk Factors:                None *FRAX is a Materials engineer of the [employer] for Metabolic Bone Disease, a World Pharmacologist (WHO) [facility]. ASSESSMENT: The probability of a major osteoporotic fracture is 11.8% within the next ten years. The probability of a hip fracture is 3.1% within the next ten years. Electronically Signed   By: Marijo Conception, M.D.   On: 02/18/2018 11:36   US Breast Ltd Uni Left Inc Axilla  Result Date: 02/25/2018 CLINICAL DATA:  Patient was called back from screening mammogram for a possible mass in the left breast. EXAM: DIGITAL DIAGNOSTIC LEFT MAMMOGRAM WITH TOMO ULTRASOUND LEFT BREAST COMPARISON:  Previous exam(s). ACR Breast Density Category b: There are scattered areas of fibroglandular density. FINDINGS: Additional imaging of the left breast was performed showing persistence of a 9 mm spiculated mass in the  lower-inner quadrant of the left breast. There are no malignant type microcalcifications. On physical exam, I palpate discrete thickening in the left breast at 8 o'clock 1 cm from the nipple. Targeted ultrasound is performed, showing an irregular hypoechoic mass in the left breast at 8 o'clock 1 cm from the nipple measuring 1.2 x 1.2 x 0.9 cm. Sonographic evaluation  the left axilla does not show any enlarged adenopathy. IMPRESSION: Suspicious mass in the 8 o'clock region of the left breast. RECOMMENDATION: Ultrasound-guided core biopsy of the mass in the 8 o'clock region of the left breast is recommended. The biopsy will be scheduled at the patient's convenience. I have discussed the findings and recommendations with the patient. Results were also provided in writing at the conclusion of the visit. If applicable, a reminder letter will be sent to the patient regarding the next appointment. BI-RADS CATEGORY  5: Highly suggestive of malignancy. Electronically Signed   By: Lillia Mountain M.D.   On: 02/25/2018 14:48   Mm Diag Breast Tomo Uni Left  Result Date: 02/25/2018 CLINICAL DATA:  Patient was called back from screening mammogram for a possible mass in the left breast. EXAM: DIGITAL DIAGNOSTIC LEFT MAMMOGRAM WITH TOMO ULTRASOUND LEFT BREAST COMPARISON:  Previous exam(s). ACR Breast Density Category b: There are scattered areas of fibroglandular density. FINDINGS: Additional imaging of the left breast was performed showing persistence of a 9 mm spiculated mass in the lower-inner quadrant of the left breast. There are no malignant type microcalcifications. On physical exam, I palpate discrete thickening in the left breast at 8 o'clock 1 cm from the nipple. Targeted ultrasound is performed, showing an irregular hypoechoic mass in the left breast at 8 o'clock 1 cm from the nipple measuring 1.2 x 1.2 x 0.9 cm. Sonographic evaluation the left axilla does not show any enlarged adenopathy. IMPRESSION: Suspicious mass in  the 8 o'clock region of the left breast. RECOMMENDATION: Ultrasound-guided core biopsy of the mass in the 8 o'clock region of the left breast is recommended. The biopsy will be scheduled at the patient's convenience. I have discussed the findings and recommendations with the patient. Results were also provided in writing at the conclusion of the visit. If applicable, a reminder letter will be sent to the patient regarding the next appointment. BI-RADS CATEGORY  5: Highly suggestive of malignancy. Electronically Signed   By: Lillia Mountain M.D.   On: 02/25/2018 14:48   Mm Screening Breast Tomo Bilateral  Result Date: 02/18/2018 CLINICAL DATA:  Screening. EXAM: DIGITAL SCREENING BILATERAL MAMMOGRAM WITH TOMO AND CAD COMPARISON:  Previous exam(s). ACR Breast Density Category c: The breast tissue is heterogeneously dense, which may obscure small masses. FINDINGS: In the left breast, a possible mass warrants further evaluation. In the right breast, no findings suspicious for malignancy. Images were processed with CAD. IMPRESSION: Further evaluation is suggested for possible mass in the left breast. RECOMMENDATION: Diagnostic mammogram and possibly ultrasound of the left breast. (Code:[id number]-L-76M) The patient will be contacted regarding the findings, and additional imaging will be scheduled. BI-RADS CATEGORY  0: Incomplete. Need additional imaging evaluation and/or prior mammograms for comparison. Electronically Signed   By: Lillia Mountain M.D.   On: 02/18/2018 12:53   Mm Clip Placement Left  Result Date: 03/03/2018 CLINICAL DATA:  Post ultrasound-guided biopsy of a suspicious mass in the left breast 8 o'clock 1 cm from the nipple. EXAM: DIAGNOSTIC LEFT MAMMOGRAM POST ULTRASOUND BIOPSY COMPARISON:  Previous exam(s). FINDINGS: Mammographic images were obtained following ultrasound guided biopsy of a suspicious mass in the left breast at 8 o'clock 1 cm from the nipple. A heart shaped biopsy marking clip appears appropriately  positioned at the site of the biopsied mass in the left breast. IMPRESSION: Heart shaped biopsy marking clip at site of biopsied mass in the left breast at the 8 o'clock position. Final Assessment: Post Procedure Mammograms for Marker  Placement Electronically Signed   By: Everlean Alstrom M.D.   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 This was found to be concordant by Dr. Everlean Alstrom. Pathology results were discussed with the patient by telephone. The patient reported doing well after the biopsy with tenderness at the site. Post biopsy instructions and care were reviewed and questions were answered. The patient was encouraged to call [facility] for any additional concerns. The patient was referred to [facility] on March 11, 2018. Pathology results reported by Roselind Messier, RN on 03/04/2018. Electronically Signed   By: Everlean Alstrom M.D.   On: 03/04/2018 15:04   Result Date: 03/04/2018 CLINICAL DATA:  77 year old female with a suspicious mass in the left breast at 8 o'clock 1 cm from the nipple. EXAM: ULTRASOUND GUIDED LEFT BREAST CORE NEEDLE BIOPSY COMPARISON:  Previous exam(s). FINDINGS: I met with the patient and we discussed the procedure of ultrasound-guided biopsy, including benefits and alternatives. We discussed the high likelihood of a successful procedure. We discussed the risks of the procedure, including infection, bleeding, tissue injury, clip migration, and inadequate sampling. Informed written consent was given. The usual time-out protocol was performed immediately prior to the procedure. Lesion quadrant: Lower inner Using sterile technique and 1% Lidocaine as local anesthetic, under direct ultrasound  visualization, a 12 gauge spring-loaded device was used to perform biopsy of the mass in the left breast at the 8 o'clock position using a medial to lateral approach. At the conclusion of the procedure a heart shaped tissue marker clip was deployed into the biopsy cavity. Follow up 2 view mammogram was performed and dictated separately. IMPRESSION: Ultrasound guided biopsy of the mass in the left breast at the 8 o'clock position. No apparent complications. Electronically Signed: By: Everlean Alstrom M.D. On: 03/03/2018 14:30     Assessment & Plan:   COPD, moderate ([place]) Continue on BREO . Appears well controlled  May remain off [place] cxr today .   Plan  Patient Instructions  Continue on BREO 1 puff daily , rinse after use  Chest xray today .  Follow up with Dr. Chase Caller in 1 year and As needed        Bronchiectasis with (acute) exacerbation ([place]) Controlled w/out flare   Plan  Patient Instructions  Continue on BREO 1 puff daily , rinse after use  Chest xray today .  Follow up with Dr. Chase Caller in 1 year and As needed           Rexene Edison, NP 03/17/2018

## 2018-03-18 ENCOUNTER — Telehealth: Payer: Self-pay | Admitting: Internal Medicine

## 2018-03-18 ENCOUNTER — Telehealth: Payer: Self-pay | Admitting: Pharmacy Technician

## 2018-03-18 NOTE — Telephone Encounter (Signed)
Called and spoke with patient. Gave results per Rexene Edison, NP. Patient verbalized understanding. Nothing further is needed at this time.     Notes recorded by Rinaldo Ratel, CMA on 03/17/2018 at 3:18 PM EDT LMOM TCB x1. ------  Notes recorded by Melvenia Needles, NP on 03/17/2018 at 11:09 AM EDT Stable chronic bronchiectasis  Cont w/ ov recs

## 2018-03-18 NOTE — H&P (Signed)
Molly Ross Documented: 03/11/2018 12:11 PM Location: [facility] Patient #: [id number] DOB: 25-Jan-1941 Undefined / Language: Cleophus Molt / Race: White Female   History of Present Illness Molly Klein MD; 03/11/2018 12:19 PM) The patient is a 77 year old female who presents with breast cancer. Pt is a 77 yo F with a new diagnosis of left breast cancer. She had a screening detected left breast mass. She underwent dx imaging which showed a 1.2 cm mass at 8 o'clock and negative axilla. Core needle biopsy was performed and path showed invasive mammary carcinoma, ductal phenotype, grade 2. This was ER/PR positive, Her -2 negative, and Ki 67 was 5%. She denies prior breast issues or callbacks. she has no personal cancer history, but has a maternal aunt with ovarian cancer dx age 77 and a sister wtih ovarian cancer dx age 36. She denies breast complaints priro to this. She is a former smoker and drinks wine rarely.   Menarche was age 16-14. Menopause was in her 16s. She used hormonal contraception in the past. she is a G1P1 with first child age 48. She is up to date with colonoscopy, bone density study, and pap smear.    Dx mammogram/us 02/25/2018 ACR Breast Density Category b: There are scattered areas of fibroglandular density.  FINDINGS: Additional imaging of the left breast was performed showing persistence of a 9 mm spiculated mass in the lower-inner quadrant of the left breast. There are no malignant type microcalcifications.  On physical exam, I palpate discrete thickening in the left breast at 8 o'clock 1 cm from the nipple.  Targeted ultrasound is performed, showing an irregular hypoechoic mass in the left breast at 8 o'clock 1 cm from the nipple measuring 1.2 x 1.2 x 0.9 cm. Sonographic evaluation the left axilla does not show any enlarged adenopathy.  IMPRESSION: Suspicious mass in the 8 o'clock region of the left breast. BIRADS 5  pathology  03/03/2018 Breast, left, needle core biopsy, 8 o'clock - INVASIVE MAMMARY CARCINOMA, ductal phenotype. ER/PR positive. Her 2 negative. Ki 67 5%.   Labs 03/11/2018 CBC, CMET normal   Past Surgical History Molly Klein, MD; 03/11/2018 12:19 PM) Breast Biopsy  Bilateral. Cataract Surgery  Bilateral. Colon Polyp Removal - Colonoscopy  Oral Surgery  Shoulder Surgery  Left. Tonsillectomy   Diagnostic Studies History Molly Klein, MD; 03/11/2018 12:19 PM) Colonoscopy  within last year Mammogram  within last year Pap Smear  >5 years ago  Medication History Molly Klein, MD; 03/11/2018 12:19 PM) Medications Reconciled  Social History Molly Klein, MD; 03/11/2018 12:19 PM) Alcohol use  Occasional alcohol use. Caffeine use  Coffee. No drug use  Tobacco use  Former smoker.  Family History Molly Klein, MD; 03/11/2018 12:19 PM) Arthritis  Father. Cerebrovascular Accident  Mother. Cervical Cancer  Mother. Diabetes Mellitus  Mother, Sister. Heart Disease  Mother. Hypertension  Mother. Ovarian Cancer  Sister. Respiratory Condition  Father. Seizure disorder  Sister. Thyroid problems  Mother.  Pregnancy / Birth History Molly Klein, MD; 03/11/2018 12:19 PM) Age at menarche  61 years. Age of menopause  <45 Contraceptive History  Oral contraceptives. Gravida  2 Irregular periods  Maternal age  58-30 Para  23  Other Problems Molly Klein, MD; 03/11/2018 12:19 PM) Arthritis  Breast Cancer  Chest pain  Chronic Obstructive Lung Disease  Diverticulosis  Heart murmur  Hypercholesterolemia  Lump In Breast     Review of Systems Molly Klein MD; 03/11/2018 12:19 PM) General Not Present- Appetite Loss, Chills, Fatigue, Fever,  Night Sweats, Weight Gain and Weight Loss. Skin Present- Dryness. Not Present- Change in Wart/Mole, Hives, Jaundice, New Lesions, Non-Healing Wounds, Rash and Ulcer. HEENT Present- Wears glasses/contact lenses. Not  Present- Earache, Hearing Loss, Hoarseness, Nose Bleed, Oral Ulcers, Ringing in the Ears, Seasonal Allergies, Sinus Pain, Sore Throat, Visual Disturbances and Yellow Eyes. Respiratory Present- Chronic Cough, Difficulty Breathing and Snoring. Not Present- Bloody sputum and Wheezing. Breast Present- Breast Pain. Not Present- Breast Mass, Nipple Discharge and Skin Changes. Cardiovascular Not Present- Chest Pain, Difficulty Breathing Lying Down, Leg Cramps, Palpitations, Rapid Heart Rate, Shortness of Breath and Swelling of Extremities. Gastrointestinal Present- Abdominal Pain, Bloating and Constipation. Not Present- Bloody Stool, Change in Bowel Habits, Chronic diarrhea, Difficulty Swallowing, Excessive gas, Gets full quickly at meals, Hemorrhoids, Indigestion, Nausea, Rectal Pain and Vomiting. Female Genitourinary Present- Frequency and Urgency. Not Present- Nocturia, Painful Urination and Pelvic Pain. Musculoskeletal Not Present- Back Pain, Joint Pain, Joint Stiffness, Muscle Pain, Muscle Weakness and Swelling of Extremities. Neurological Not Present- Decreased Memory, Fainting, Headaches, Numbness, Seizures, Tingling, Tremor, Trouble walking and Weakness. Psychiatric Not Present- Anxiety, Bipolar, Change in Sleep Pattern, Depression, Fearful and Frequent crying. Endocrine Not Present- Cold Intolerance, Excessive Hunger, Hair Changes, Heat Intolerance, Hot flashes and New Diabetes. Hematology Present- Blood Thinners. Not Present- Easy Bruising, Excessive bleeding, Gland problems, HIV and Persistent Infections.  Vitals Molly Klein MD; 03/11/2018 12:13 PM) 03/11/2018 12:12 PM Weight: 140.1 lb Height: 68in Body Surface Area: 1.76 m Body Mass Index: 21.3 kg/m  Temp.: 97.73F  Pulse: 71 (Regular)  Resp.: 17 (Unlabored)  BP: 143/73 (Sitting, Left Arm, Standard)       Physical Exam Molly Klein MD; 03/11/2018 12:21 PM) General Mental Status-Alert. General Appearance-Consistent  with stated age. Hydration-Well hydrated. Voice-Normal.  Head and Neck Head-normocephalic, atraumatic with no lesions or palpable masses. Trachea-midline. Thyroid Gland Characteristics - normal size and consistency.  Eye Eyeball - Bilateral-Extraocular movements intact. Sclera/Conjunctiva - Bilateral-No scleral icterus.  Chest and Lung Exam Chest and lung exam reveals -quiet, even and easy respiratory effort with no use of accessory muscles and on auscultation, normal breath sounds, no adventitious sounds and normal vocal resonance. Inspection Chest Wall - Normal. Back - normal.  Breast Note: Breasts are symmetric bilaterally. She has numerous seborrheic keratoses. they are mildly ptotic. She has no palpable masses, nipple retraction, nipple discharge, skin dimpling, or LAD. The left breast has some faint bruising on the inner lower breast. This portion is slightly tender. She has heterogeneously dense breast tissue.   Cardiovascular Cardiovascular examination reveals -normal heart sounds, regular rate and rhythm with no murmurs and normal pedal pulses bilaterally.  Abdomen Inspection Inspection of the abdomen reveals - No Hernias. Palpation/Percussion Palpation and Percussion of the abdomen reveal - Soft, Non Tender, No Rebound tenderness, No Rigidity (guarding) and No hepatosplenomegaly. Auscultation Auscultation of the abdomen reveals - Bowel sounds normal.  Neurologic Neurologic evaluation reveals -alert and oriented x 3 with no impairment of recent or remote memory. Mental Status-Normal.  Musculoskeletal Global Assessment -Note: no gross deformities.  Normal Exam - Left-Upper Extremity Strength Normal and Lower Extremity Strength Normal. Normal Exam - Right-Upper Extremity Strength Normal and Lower Extremity Strength Normal.  Lymphatic Head & Neck  General Head & Neck Lymphatics: Bilateral - Description - Normal. Axillary  General  Axillary Region: Bilateral - Description - Normal. Tenderness - Non Tender. Femoral & Inguinal  Generalized Femoral & Inguinal Lymphatics: Bilateral - Description - No Generalized lymphadenopathy.    Assessment & Plan Molly Klein MD; 03/11/2018  12:25 PM) MALIGNANT NEOPLASM OF LOWER-INNER QUADRANT OF LEFT BREAST IN FEMALE, ESTROGEN RECEPTOR POSITIVE (C50.312) Impression: Pt has a new diagnosis of left breast cancer, cT1cN0. We discussed her in multidisciplinary breast conference and clinic. Concensus recommendations are for breast conservation treatment and genetic testing.  I would plan left seed localized lumpectomy and sentinel lymph node biopsy. This would be followed by external beam radiation and antiestrogen treatment.  The surgical procedure was described to the patient. I discussed the incision type and location and that we would need radiology involved on with a wire or seed marker and/or sentinel node.  The risks and benefits of the procedure were described to the patient and she wishes to proceed.  We discussed the risks bleeding, infection, damage to other structures, need for further procedures/surgeries. We discussed the risk of seroma. The patient was advised if the area in the breast in cancer, we may need to go back to surgery for additional tissue to obtain negative margins or for a lymph node biopsy. The patient was advised that these are the most common complications, but that others can occur as well. They were advised against taking aspirin or other anti-inflammatory agents/blood thinners the week before surgery.  She would like to pursue this as soon as possible. Current Plans Referred to Genetic Counseling, for evaluation and follow up (Medical Genetics). Routine. Schedule for Surgery Pt Education - [facility] Breast Cancer Information Given - [employer] "Breast Journey" Package FAMILY HISTORY OF OVARIAN CANCER (Z80.41) Impression: see above.    Signed by Molly Klein, MD  (03/11/2018 12:25 PM)

## 2018-03-19 ENCOUNTER — Ambulatory Visit (HOSPITAL_BASED_OUTPATIENT_CLINIC_OR_DEPARTMENT_OTHER): Payer: Medicare Other | Admitting: Anesthesiology

## 2018-03-19 ENCOUNTER — Encounter (HOSPITAL_BASED_OUTPATIENT_CLINIC_OR_DEPARTMENT_OTHER)
Admission: RE | Disposition: A | Discharge status: Home / Self Care | Payer: Self-pay | Source: Ambulatory Visit | Attending: General Surgery

## 2018-03-19 ENCOUNTER — Other Ambulatory Visit: Payer: Self-pay

## 2018-03-19 ENCOUNTER — Encounter (HOSPITAL_BASED_OUTPATIENT_CLINIC_OR_DEPARTMENT_OTHER): Payer: Self-pay | Admitting: *Deleted

## 2018-03-19 ENCOUNTER — Ambulatory Visit (HOSPITAL_BASED_OUTPATIENT_CLINIC_OR_DEPARTMENT_OTHER)
Admission: RE | Admit: 2018-03-19 | Discharge: 2018-03-19 | Disposition: A | Discharge status: Home / Self Care | Payer: Medicare Other | Source: Ambulatory Visit | Attending: General Surgery | Admitting: General Surgery

## 2018-03-19 ENCOUNTER — Ambulatory Visit
Admission: RE | Admit: 2018-03-19 | Discharge: 2018-03-19 | Disposition: A | Discharge status: Home / Self Care | Payer: Medicare Other | Source: Ambulatory Visit | Attending: General Surgery | Admitting: General Surgery

## 2018-03-19 ENCOUNTER — Ambulatory Visit (HOSPITAL_COMMUNITY)
Admission: RE | Admit: 2018-03-19 | Discharge: 2018-03-19 | Disposition: A | Discharge status: Home / Self Care | Payer: Medicare Other | Source: Ambulatory Visit | Attending: General Surgery | Admitting: General Surgery

## 2018-03-19 DIAGNOSIS — K449 Diaphragmatic hernia without obstruction or gangrene: Secondary | ICD-10-CM | POA: Insufficient documentation

## 2018-03-19 DIAGNOSIS — Z836 Family history of other diseases of the respiratory system: Secondary | ICD-10-CM | POA: Diagnosis not present

## 2018-03-19 DIAGNOSIS — C773 Secondary and unspecified malignant neoplasm of axilla and upper limb lymph nodes: Secondary | ICD-10-CM | POA: Insufficient documentation

## 2018-03-19 DIAGNOSIS — J449 Chronic obstructive pulmonary disease, unspecified: Secondary | ICD-10-CM | POA: Diagnosis not present

## 2018-03-19 DIAGNOSIS — C50312 Malignant neoplasm of lower-inner quadrant of left female breast: Secondary | ICD-10-CM | POA: Insufficient documentation

## 2018-03-19 DIAGNOSIS — Z808 Family history of malignant neoplasm of other organs or systems: Secondary | ICD-10-CM | POA: Insufficient documentation

## 2018-03-19 DIAGNOSIS — Z170 Estrogen receptor positive status [ER+]: Secondary | ICD-10-CM | POA: Insufficient documentation

## 2018-03-19 DIAGNOSIS — K219 Gastro-esophageal reflux disease without esophagitis: Secondary | ICD-10-CM | POA: Diagnosis not present

## 2018-03-19 DIAGNOSIS — Z820 Family history of epilepsy and other diseases of the nervous system: Secondary | ICD-10-CM | POA: Diagnosis not present

## 2018-03-19 DIAGNOSIS — Z8261 Family history of arthritis: Secondary | ICD-10-CM | POA: Insufficient documentation

## 2018-03-19 DIAGNOSIS — Z8041 Family history of malignant neoplasm of ovary: Secondary | ICD-10-CM | POA: Insufficient documentation

## 2018-03-19 DIAGNOSIS — I739 Peripheral vascular disease, unspecified: Secondary | ICD-10-CM | POA: Diagnosis not present

## 2018-03-19 DIAGNOSIS — Z833 Family history of diabetes mellitus: Secondary | ICD-10-CM | POA: Insufficient documentation

## 2018-03-19 DIAGNOSIS — G8918 Other acute postprocedural pain: Secondary | ICD-10-CM | POA: Diagnosis not present

## 2018-03-19 DIAGNOSIS — Z8349 Family history of other endocrine, nutritional and metabolic diseases: Secondary | ICD-10-CM | POA: Diagnosis not present

## 2018-03-19 DIAGNOSIS — Z87891 Personal history of nicotine dependence: Secondary | ICD-10-CM | POA: Diagnosis not present

## 2018-03-19 DIAGNOSIS — Z823 Family history of stroke: Secondary | ICD-10-CM | POA: Diagnosis not present

## 2018-03-19 DIAGNOSIS — C50912 Malignant neoplasm of unspecified site of left female breast: Secondary | ICD-10-CM | POA: Diagnosis not present

## 2018-03-19 DIAGNOSIS — Z8249 Family history of ischemic heart disease and other diseases of the circulatory system: Secondary | ICD-10-CM | POA: Insufficient documentation

## 2018-03-19 DIAGNOSIS — I2510 Atherosclerotic heart disease of native coronary artery without angina pectoris: Secondary | ICD-10-CM | POA: Diagnosis not present

## 2018-03-19 HISTORY — DX: Dyspnea, unspecified: R06.00

## 2018-03-19 HISTORY — DX: Gastro-esophageal reflux disease without esophagitis: K21.9

## 2018-03-19 HISTORY — PX: BREAST LUMPECTOMY WITH RADIOACTIVE SEED AND SENTINEL LYMPH NODE BIOPSY: SHX6550

## 2018-03-19 HISTORY — DX: Malignant (primary) neoplasm, unspecified: C80.1

## 2018-03-19 SURGERY — BREAST LUMPECTOMY WITH RADIOACTIVE SEED AND SENTINEL LYMPH NODE BIOPSY
Anesthesia: General | Site: Breast | Laterality: Left

## 2018-03-19 MED ORDER — LIDOCAINE HCL (CARDIAC) 20 MG/ML IV SOLN
INTRAVENOUS | Status: AC
  Filled 2018-03-19: qty 5

## 2018-03-19 MED ORDER — CEFAZOLIN SODIUM-DEXTROSE 2-4 GM/100ML-% IV SOLN
INTRAVENOUS | Status: AC
  Filled 2018-03-19: qty 100

## 2018-03-19 MED ORDER — LIDOCAINE-EPINEPHRINE (PF) 1 %-1:200000 IJ SOLN
INTRAMUSCULAR | Status: DC | PRN
  Administered 2018-03-19: 30 mL via INTRAMUSCULAR

## 2018-03-19 MED ORDER — GABAPENTIN 300 MG PO CAPS
300.0000 mg | ORAL_CAPSULE | ORAL | Status: AC
  Administered 2018-03-19: 300 mg via ORAL

## 2018-03-19 MED ORDER — LIDOCAINE HCL (CARDIAC) 20 MG/ML IV SOLN
INTRAVENOUS | Status: DC | PRN
  Administered 2018-03-19: 75 mg via INTRAVENOUS

## 2018-03-19 MED ORDER — GABAPENTIN 300 MG PO CAPS
ORAL_CAPSULE | ORAL | Status: AC
  Filled 2018-03-19: qty 1

## 2018-03-19 MED ORDER — ACETAMINOPHEN 500 MG PO TABS
1000.0000 mg | ORAL_TABLET | ORAL | Status: AC
  Administered 2018-03-19: 1000 mg via ORAL

## 2018-03-19 MED ORDER — ONDANSETRON HCL 4 MG/2ML IJ SOLN
INTRAMUSCULAR | Status: AC
  Filled 2018-03-19: qty 2

## 2018-03-19 MED ORDER — ONDANSETRON HCL 4 MG/2ML IJ SOLN
4.0000 mg | Freq: Once | INTRAMUSCULAR | Status: DC | PRN
Start: 2018-03-19 — End: 2018-03-19

## 2018-03-19 MED ORDER — PROPOFOL 500 MG/50ML IV EMUL
INTRAVENOUS | Status: AC
  Filled 2018-03-19: qty 50

## 2018-03-19 MED ORDER — ACETAMINOPHEN 500 MG PO TABS
ORAL_TABLET | ORAL | Status: AC
  Filled 2018-03-19: qty 2

## 2018-03-19 MED ORDER — DEXAMETHASONE SODIUM PHOSPHATE 10 MG/ML IJ SOLN
INTRAMUSCULAR | Status: AC
Start: 2018-03-19 — End: ?
  Filled 2018-03-19: qty 1

## 2018-03-19 MED ORDER — FENTANYL CITRATE (PF) 100 MCG/2ML IJ SOLN
INTRAMUSCULAR | Status: AC
  Filled 2018-03-19: qty 2

## 2018-03-19 MED ORDER — DEXAMETHASONE SODIUM PHOSPHATE 4 MG/ML IJ SOLN
INTRAMUSCULAR | Status: DC | PRN
  Administered 2018-03-19: 10 mg via INTRAVENOUS

## 2018-03-19 MED ORDER — CHLORHEXIDINE GLUCONATE CLOTH 2 % EX PADS: 6.0000 | MEDICATED_PAD | Freq: Once | CUTANEOUS | Status: DC

## 2018-03-19 MED ORDER — BUPIVACAINE-EPINEPHRINE (PF) 0.5% -1:200000 IJ SOLN
INTRAMUSCULAR | Status: DC | PRN
  Administered 2018-03-19: 30 mL via PERINEURAL

## 2018-03-19 MED ORDER — CEFAZOLIN SODIUM-DEXTROSE 2-4 GM/100ML-% IV SOLN
2.0000 g | INTRAVENOUS | Status: AC
  Administered 2018-03-19: 2 g via INTRAVENOUS

## 2018-03-19 MED ORDER — FENTANYL CITRATE (PF) 100 MCG/2ML IJ SOLN
50.0000 ug | INTRAMUSCULAR | Status: AC | PRN
  Administered 2018-03-19 (×2): 25 ug via INTRAVENOUS
  Administered 2018-03-19: 50 ug via INTRAVENOUS
  Administered 2018-03-19: 25 ug via INTRAVENOUS

## 2018-03-19 MED ORDER — PHENYLEPHRINE 40 MCG/ML (10ML) SYRINGE FOR IV PUSH (FOR BLOOD PRESSURE SUPPORT)
PREFILLED_SYRINGE | INTRAVENOUS | Status: AC
  Filled 2018-03-19: qty 10

## 2018-03-19 MED ORDER — PROPOFOL 10 MG/ML IV BOLUS
INTRAVENOUS | Status: DC | PRN
  Administered 2018-03-19: 150 mg via INTRAVENOUS

## 2018-03-19 MED ORDER — TECHNETIUM TC 99M SULFUR COLLOID FILTERED
1.0000 | Freq: Once | INTRAVENOUS | Status: AC | PRN
  Administered 2018-03-19: 1 via INTRADERMAL

## 2018-03-19 MED ORDER — MIDAZOLAM HCL 2 MG/2ML IJ SOLN
INTRAMUSCULAR | Status: AC
  Filled 2018-03-19: qty 2

## 2018-03-19 MED ORDER — ONDANSETRON HCL 4 MG/2ML IJ SOLN
INTRAMUSCULAR | Status: DC | PRN
  Administered 2018-03-19: 4 mg via INTRAVENOUS

## 2018-03-19 MED ORDER — BUPIVACAINE-EPINEPHRINE (PF) 0.25% -1:200000 IJ SOLN
INTRAMUSCULAR | Status: AC
  Filled 2018-03-19: qty 30

## 2018-03-19 MED ORDER — OXYCODONE HCL 5 MG PO TABS: 5.0000 mg | ORAL_TABLET | Freq: Four times a day (QID) | ORAL | 0 refills | Status: DC | PRN

## 2018-03-19 MED ORDER — LIDOCAINE HCL (PF) 1 % IJ SOLN
INTRAMUSCULAR | Status: AC
  Filled 2018-03-19: qty 30

## 2018-03-19 MED ORDER — SCOPOLAMINE 1 MG/3DAYS TD PT72: 1.0000 | MEDICATED_PATCH | Freq: Once | TRANSDERMAL | Status: DC | PRN

## 2018-03-19 MED ORDER — SUCCINYLCHOLINE CHLORIDE 200 MG/10ML IV SOSY
PREFILLED_SYRINGE | INTRAVENOUS | Status: AC
  Filled 2018-03-19: qty 10

## 2018-03-19 MED ORDER — EPHEDRINE 5 MG/ML INJ
INTRAVENOUS | Status: AC
  Filled 2018-03-19: qty 10

## 2018-03-19 MED ORDER — LACTATED RINGERS IV SOLN
INTRAVENOUS | Status: DC
  Administered 2018-03-19 (×2): via INTRAVENOUS

## 2018-03-19 MED ORDER — FENTANYL CITRATE (PF) 100 MCG/2ML IJ SOLN
25.0000 ug | INTRAMUSCULAR | Status: DC | PRN
  Administered 2018-03-19 (×3): 25 ug via INTRAVENOUS

## 2018-03-19 MED ORDER — MIDAZOLAM HCL 2 MG/2ML IJ SOLN: 1.0000 mg | INTRAMUSCULAR | Status: DC | PRN

## 2018-03-19 SURGICAL SUPPLY — 50 items
ADH SKN CLS APL DERMABOND .7 (GAUZE/BANDAGES/DRESSINGS) ×1
BINDER BREAST MEDIUM (GAUZE/BANDAGES/DRESSINGS) ×3 IMPLANT
BLADE SURG 10 STRL SS (BLADE) ×3 IMPLANT
BLADE SURG 15 STRL LF DISP TIS (BLADE) ×1 IMPLANT
BLADE SURG 15 STRL SS (BLADE) ×3
BNDG COHESIVE 4X5 TAN STRL (GAUZE/BANDAGES/DRESSINGS) ×3 IMPLANT
CANISTER SUCT 1200ML W/VALVE (MISCELLANEOUS) ×3 IMPLANT
CHLORAPREP W/TINT 26ML (MISCELLANEOUS) ×3 IMPLANT
CLIP VESOCCLUDE LG 6/CT (CLIP) ×3 IMPLANT
CLIP VESOCCLUDE MED 6/CT (CLIP) ×6 IMPLANT
CLOSURE WOUND 1/2 X4 (GAUZE/BANDAGES/DRESSINGS) ×1
COVER MAYO STAND STRL (DRAPES) ×3 IMPLANT
COVER PROBE W GEL 5X96 (DRAPES) ×3 IMPLANT
DERMABOND ADVANCED (GAUZE/BANDAGES/DRESSINGS) ×2
DERMABOND ADVANCED .7 DNX12 (GAUZE/BANDAGES/DRESSINGS) ×1 IMPLANT
DEVICE DUBIN W/COMP PLATE 8390 (MISCELLANEOUS) ×3 IMPLANT
DRAPE UTILITY XL STRL (DRAPES) ×3 IMPLANT
DRSG PAD ABDOMINAL 8X10 ST (GAUZE/BANDAGES/DRESSINGS) ×3 IMPLANT
ELECT COATED BLADE 2.86 ST (ELECTRODE) ×3 IMPLANT
ELECT REM PT RETURN 9FT ADLT (ELECTROSURGICAL) ×3
ELECTRODE REM PT RTRN 9FT ADLT (ELECTROSURGICAL) ×1 IMPLANT
GAUZE SPONGE 4X4 12PLY STRL LF (GAUZE/BANDAGES/DRESSINGS) ×3 IMPLANT
GLOVE BIO SURGEON STRL SZ 6 (GLOVE) ×3 IMPLANT
GLOVE BIOGEL PI IND STRL 6.5 (GLOVE) ×1 IMPLANT
GLOVE BIOGEL PI INDICATOR 6.5 (GLOVE) ×2
GOWN STRL REUS W/ TWL LRG LVL3 (GOWN DISPOSABLE) ×1 IMPLANT
GOWN STRL REUS W/TWL 2XL LVL3 (GOWN DISPOSABLE) ×3 IMPLANT
GOWN STRL REUS W/TWL LRG LVL3 (GOWN DISPOSABLE) ×3
KIT MARKER MARGIN INK (KITS) ×3 IMPLANT
LIGHT WAVEGUIDE WIDE FLAT (MISCELLANEOUS) ×3 IMPLANT
NEEDLE HYPO 25X1 1.5 SAFETY (NEEDLE) ×3 IMPLANT
NS IRRIG 1000ML POUR BTL (IV SOLUTION) ×3 IMPLANT
PACK BASIN DAY SURGERY FS (CUSTOM PROCEDURE TRAY) ×3 IMPLANT
PACK UNIVERSAL I (CUSTOM PROCEDURE TRAY) ×3 IMPLANT
PENCIL BUTTON HOLSTER BLD 10FT (ELECTRODE) ×3 IMPLANT
SLEEVE SCD COMPRESS KNEE MED (MISCELLANEOUS) ×3 IMPLANT
SPONGE LAP 18X18 RF (DISPOSABLE) ×6 IMPLANT
STOCKINETTE IMPERVIOUS LG (DRAPES) ×3 IMPLANT
STRIP CLOSURE SKIN 1/2X4 (GAUZE/BANDAGES/DRESSINGS) ×2 IMPLANT
SUT MNCRL AB 4-0 PS2 18 (SUTURE) ×3 IMPLANT
SUT VIC AB 2-0 SH 27 (SUTURE) ×2
SUT VIC AB 2-0 SH 27XBRD (SUTURE) ×1 IMPLANT
SUT VIC AB 3-0 SH 27 (SUTURE) ×2
SUT VIC AB 3-0 SH 27X BRD (SUTURE) ×1 IMPLANT
SUT VICRYL 3-0 CR8 SH (SUTURE) ×3 IMPLANT
SYR CONTROL 10ML LL (SYRINGE) ×3 IMPLANT
TOWEL OR 17X24 6PK STRL BLUE (TOWEL DISPOSABLE) ×3 IMPLANT
TUBE CONNECTING 20'X1/4 (TUBING) ×1
TUBE CONNECTING 20X1/4 (TUBING) ×2 IMPLANT
YANKAUER SUCT BULB TIP NO VENT (SUCTIONS) ×3 IMPLANT

## 2018-03-19 NOTE — Progress Notes (Signed)
Assisted Dr. Turk with left, ultrasound guided, pectoralis block. Side rails up, monitors on throughout procedure. See vital signs in flow sheet. Tolerated Procedure well. 

## 2018-03-19 NOTE — Anesthesia Procedure Notes (Signed)
Performed by: Madilynn Montante D, CRNA       

## 2018-03-19 NOTE — Anesthesia Postprocedure Evaluation (Signed)
Anesthesia Post Note  Patient: Molly Ross  Procedure(s) Performed: BREAST LUMPECTOMY WITH RADIOACTIVE SEED AND SENTINEL LYMPH NODE BIOPSY (Left Breast)     Patient location during evaluation: PACU Anesthesia Type: General Level of consciousness: awake and alert Pain management: pain level controlled Vital Signs Assessment: post-procedure vital signs reviewed and stable Respiratory status: spontaneous breathing, nonlabored ventilation and respiratory function stable Cardiovascular status: blood pressure returned to baseline and stable Postop Assessment: no apparent nausea or vomiting Anesthetic complications: no    Last Vitals:  Vitals:   03/19/18 1215 03/19/18 1226  BP: 121/62 138/64  Pulse: 77 77  Resp: 20 18  Temp:  36.7 C  SpO2: 91% 95%    Last Pain:  Vitals:   03/19/18 1226  TempSrc:   PainSc: 2                  Catalina Gravel

## 2018-03-19 NOTE — Transfer of Care (Signed)
Immediate Anesthesia Transfer of Care Note  Patient: Molly Ross  Procedure(s) Performed: BREAST LUMPECTOMY WITH RADIOACTIVE SEED AND SENTINEL LYMPH NODE BIOPSY (Left Breast)  Patient Location: PACU  Anesthesia Type:General  Level of Consciousness: awake, alert , oriented and drowsy  Airway & Oxygen Therapy: Patient Spontanous Breathing and Patient connected to face mask oxygen  Post-op Assessment: Report given to RN and Post -op Vital signs reviewed and stable  Post vital signs: Reviewed and stable  Last Vitals:  Vitals Value Taken Time  BP 140/61 03/19/2018 11:02 AM  Temp    Pulse 89 03/19/2018 11:04 AM  Resp 16 03/19/2018 11:04 AM  SpO2 100 % 03/19/2018 11:04 AM  Vitals shown include unvalidated device data.  Last Pain:  Vitals:   03/19/18 0735  TempSrc: Oral  PainSc: 0-No pain         Complications: No apparent anesthesia complications

## 2018-03-19 NOTE — Anesthesia Procedure Notes (Signed)
Anesthesia Regional Block: Pectoralis block   Pre-Anesthetic Checklist: ,, timeout performed, Correct Patient, Correct Site, Correct Laterality, Correct Procedure, Correct Position, site marked, Risks and benefits discussed,  Surgical consent,  Pre-op evaluation,  At surgeon's request and post-op pain management  Laterality: Left  Prep: chloraprep       Needles:  Injection technique: Single-shot  Needle Type: Echogenic Needle     Needle Length: 9cm  Needle Gauge: 21     Additional Needles:   Procedures:,,,, ultrasound used (permanent image in chart),,,,  Narrative:  Start time: 03/19/2018 8:20 AM End time: 03/19/2018 8:25 AM Injection made incrementally with aspirations every 5 mL.  Performed by: Personally  Anesthesiologist: Catalina Gravel, MD  Additional Notes: No pain on injection. No increased resistance to injection. Injection made in 5cc increments.  Good needle visualization.  Patient tolerated procedure well.

## 2018-03-19 NOTE — Anesthesia Procedure Notes (Signed)
Procedure Name: LMA Insertion Date/Time: 03/19/2018 9:33 AM Performed by: Willa Frater, CRNA Pre-anesthesia Checklist: Patient identified, Emergency Drugs available, Suction available and Patient being monitored Patient Re-evaluated:Patient Re-evaluated prior to induction Oxygen Delivery Method: Circle system utilized Preoxygenation: Pre-oxygenation with 100% oxygen Induction Type: IV induction Ventilation: Mask ventilation without difficulty LMA: LMA inserted LMA Size: 4.0 Number of attempts: 2 Airway Equipment and Method: Bite block Placement Confirmation: positive ETCO2 Tube secured with: Tape Dental Injury: Teeth and Oropharynx as per pre-operative assessment

## 2018-03-19 NOTE — Discharge Instructions (Addendum)
[facility] °Office Phone Number [phone] ° °BREAST BIOPSY/ PARTIAL MASTECTOMY: POST OP INSTRUCTIONS ° °Always review your discharge instruction sheet given to you by the facility where your surgery was performed. ° °IF YOU HAVE DISABILITY OR FAMILY LEAVE FORMS, YOU MUST BRING THEM TO THE OFFICE FOR PROCESSING.  DO NOT GIVE THEM TO YOUR DOCTOR. ° °1. A prescription for pain medication may be given to you upon discharge.  Take your pain medication as prescribed, if needed.  If narcotic pain medicine is not needed, then you may take acetaminophen (Tylenol) or ibuprofen (Advil) as needed. °2. Take your usually prescribed medications unless otherwise directed °3. If you need a refill on your pain medication, please contact your pharmacy.  They will contact our office to request authorization.  Prescriptions will not be filled after 5pm or on week-ends. °4. You should eat very light the first 24 hours after surgery, such as soup, crackers, pudding, etc.  Resume your normal diet the day after surgery. °5. Most patients will experience some swelling and bruising in the breast.  Ice packs and a good support bra will help.  Swelling and bruising can take several days to resolve.  °6. It is common to experience some constipation if taking pain medication after surgery.  Increasing fluid intake and taking a stool softener will usually help or prevent this problem from occurring.  A mild laxative (Milk of Magnesia or Miralax) should be taken according to package directions if there are no bowel movements after 48 hours. °7. Unless discharge instructions indicate otherwise, you may remove your bandages 48 hours after surgery, and you may shower at that time.  You may have steri-strips (small skin tapes) in place directly over the incision.  These strips should be left on the skin for 7-10 days.   Any sutures or staples will be removed at the office during your follow-up visit. °8. ACTIVITIES:  You may resume  regular daily activities (gradually increasing) beginning the next day.  Wearing a good support bra or sports bra (or the breast binder) minimizes pain and swelling.  You may have sexual intercourse when it is comfortable. °a. You may drive when you no longer are taking prescription pain medication, you can comfortably wear a seatbelt, and you can safely maneuver your car and apply brakes. °b. RETURN TO WORK:  __________1 week_______________ °9. You should see your doctor in the office for a follow-up appointment approximately two weeks after your surgery.  Your doctor’s nurse will typically make your follow-up appointment when she calls you with your pathology report.  Expect your pathology report 2-3 business days after your surgery.  You may call to check if you do not hear from us after three days. ° ° °WHEN TO CALL YOUR DOCTOR: °1. Fever over 101.0 °2. Nausea and/or vomiting. °3. Extreme swelling or bruising. °4. Continued bleeding from incision. °5. Increased pain, redness, or drainage from the incision. ° °The clinic staff is available to answer your questions during regular business hours.  Please don’t hesitate to call and ask to speak to one of the nurses for clinical concerns.  If you have a medical emergency, go to the nearest emergency room or call 911.  A surgeon from [facility] is always on call at the hospital. ° °For further questions, please visit [email or web address]  ° ° °Post Anesthesia Home Care Instructions ° °Activity: °Get plenty of rest for the remainder of the day. A responsible individual must stay with you for 24   hours following the procedure.  °For the next 24 hours, DO NOT: °-Drive a car °-Operate machinery °-Drink alcoholic beverages °-Take any medication unless instructed by your physician °-Make any legal decisions or sign important papers. ° °Meals: °Start with liquid foods such as gelatin or soup. Progress to regular foods as tolerated. Avoid greasy, spicy,  heavy foods. If nausea and/or vomiting occur, drink only clear liquids until the nausea and/or vomiting subsides. Call your physician if vomiting continues. ° °Special Instructions/Symptoms: °Your throat may feel dry or sore from the anesthesia or the breathing tube placed in your throat during surgery. If this causes discomfort, gargle with warm salt water. The discomfort should disappear within 24 hours. ° °If you had a scopolamine patch placed behind your ear for the management of post- operative nausea and/or vomiting: ° °1. The medication in the patch is effective for 72 hours, after which it should be removed.  Wrap patch in a tissue and discard in the trash. Wash hands thoroughly with soap and water. °2. You may remove the patch earlier than 72 hours if you experience unpleasant side effects which may include dry mouth, dizziness or visual disturbances. °3. Avoid touching the patch. Wash your hands with soap and water after contact with the patch. °  ° °

## 2018-03-19 NOTE — Op Note (Signed)
Left Breast Radioactive seed localized lumpectomy and sentinel lymph node biopsy  Indications: This patient presents with history of left breast cancer, cT1cN0, grade 2, +/+/-, LIQ  Pre-operative Diagnosis: Left breast cancer  Post-operative Diagnosis: Same  Surgeon: Stark Klein   Anesthesia: General endotracheal anesthesia  ASA Class: 2  Procedure Details  The patient was seen in the Holding Room. The risks, benefits, complications, treatment options, and expected outcomes were discussed with the patient. The possibilities of bleeding, infection, the need for additional procedures, failure to diagnose a condition, and creating a complication requiring transfusion or operation were discussed with the patient. The patient concurred with the proposed plan, giving informed consent.  The site of surgery properly noted/marked. The patient was taken to Operating Room # 8, identified, and the procedure verified as Left Breast Seed localized lumpectomy with sentinel lymph node biopsy. A Time Out was held and the above information confirmed.  The left arm, breast, and chest were prepped and draped in standard fashion. The lumpectomy was performed by creating an inferior circumareolar incision near the previously placed radioactive seed.  Dissection was carried down to around the point of maximum signal intensity. The cautery was used to perform the dissection.  Hemostasis was achieved with cautery. The edges of the cavity were marked with large clips, with one each medial, lateral, inferior and superior, and two clips posteriorly.   The specimen was inked with the margin marker paint kit.    Specimen radiography confirmed inclusion of the mammographic lesion, the clip, and the seed.  The background signal in the breast was zero.  The wound was irrigated and closed with 3-0 vicryl in layers and 4-0 monocryl subcuticular suture.    Using a hand-held gamma probe, left axillary sentinel nodes were identified  transcutaneously.  An oblique incision was created below the axillary hairline.  Dissection was carried through the clavipectoral fascia.  Three deep level 2 axillary sentinel nodes were removed.  Counts per second were 590, 110, and 230.    The background count was 10 cps.  The wound was irrigated.  Hemostasis was achieved with cautery.  The axillary incision was closed with a 3-0 vicryl deep dermal interrupted sutures and a 4-0 monocryl subcuticular closure.    Sterile dressings were applied. At the end of the operation, all sponge, instrument, and needle counts were correct.  Findings: grossly clear surgical margins and no adenopathy.  Posterior margin is pectoralis and anterior margin is skin.    Estimated Blood Loss:  min         Specimens: left breast lumpectomy and three deep left axillary sentinel lymph nodes.             Complications:  None; patient tolerated the procedure well.         Disposition: PACU - hemodynamically stable.         Condition: stable

## 2018-03-19 NOTE — Interval H&P Note (Signed)
History and Physical Interval Note:  03/19/2018 9:09 AM  Molly Ross  has presented today for surgery, with the diagnosis of LEFT BREAST CANCER  The various methods of treatment have been discussed with the patient and family. After consideration of risks, benefits and other options for treatment, the patient has consented to  Procedure(s): BREAST LUMPECTOMY WITH RADIOACTIVE SEED AND SENTINEL LYMPH NODE BIOPSY (Left) as a surgical intervention .  The patient's history has been reviewed, patient examined, no change in status, stable for surgery.  I have reviewed the patient's chart and labs.  Questions were answered to the patient's satisfaction.     Stark Klein

## 2018-03-19 NOTE — Progress Notes (Signed)
Nuc med inj performed by nuc med staff. Pt tol well with addditional fentanyl. VSS. Will call family to bedside and update/provide emotional support

## 2018-03-19 NOTE — Anesthesia Preprocedure Evaluation (Addendum)
Anesthesia Evaluation  Patient identified by MRN, date of birth, ID band Patient awake    Reviewed: Allergy & Precautions, NPO status , Patient's Chart, lab work & pertinent test results  Airway Mallampati: II  TM Distance: <3 FB Neck ROM: Full    Dental  (+) Teeth Intact, Dental Advisory Given   Pulmonary shortness of breath, COPD,  COPD inhaler, former smoker,    Pulmonary exam normal breath sounds clear to auscultation       Cardiovascular + Peripheral Vascular Disease  Normal cardiovascular exam Rhythm:Regular Rate:Normal     Neuro/Psych PSYCHIATRIC DISORDERS Depression negative neurological ROS     GI/Hepatic Neg liver ROS, hiatal hernia, GERD  ,  Endo/Other  negative endocrine ROS  Renal/GU negative Renal ROS     Musculoskeletal negative musculoskeletal ROS (+)   Abdominal   Peds  Hematology negative hematology ROS (+)   Anesthesia Other Findings Day of surgery medications reviewed with the patient.  LEFT BREAST CANCER  Reproductive/Obstetrics                           Anesthesia Physical Anesthesia Plan  ASA: II  Anesthesia Plan: General   Post-op Pain Management:  Regional for Post-op pain   Induction: Intravenous  PONV Risk Score and Plan: 3 and Dexamethasone, Ondansetron and Midazolam  Airway Management Planned: LMA  Additional Equipment:   Intra-op Plan:   Post-operative Plan: Extubation in OR  Informed Consent: I have reviewed the patients History and Physical, chart, labs and discussed the procedure including the risks, benefits and alternatives for the proposed anesthesia with the patient or authorized representative who has indicated his/her understanding and acceptance.   Dental advisory given  Plan Discussed with: CRNA  Anesthesia Plan Comments: (PEC plus GA with LMA)       Anesthesia Quick Evaluation

## 2018-03-20 ENCOUNTER — Encounter (HOSPITAL_BASED_OUTPATIENT_CLINIC_OR_DEPARTMENT_OTHER): Payer: Self-pay | Admitting: General Surgery

## 2018-03-20 NOTE — Telephone Encounter (Signed)
Note in error.

## 2018-03-25 ENCOUNTER — Telehealth: Payer: Self-pay | Admitting: General Surgery

## 2018-03-25 ENCOUNTER — Encounter: Payer: Self-pay | Admitting: General Surgery

## 2018-03-25 NOTE — Telephone Encounter (Signed)
Discussed pathology with patient.  Minimal soreness, not taking pain meds.

## 2018-03-25 NOTE — Progress Notes (Signed)
Pt will need follow up appt around 2 weeks from surgical date.  I discussed path with them.

## 2018-03-27 ENCOUNTER — Encounter: Payer: Self-pay | Admitting: Genetics

## 2018-03-27 ENCOUNTER — Telehealth: Payer: Self-pay | Admitting: Genetics

## 2018-03-27 ENCOUNTER — Ambulatory Visit: Payer: Self-pay | Admitting: Genetics

## 2018-03-27 DIAGNOSIS — Z1379 Encounter for other screening for genetic and chromosomal anomalies: Secondary | ICD-10-CM | POA: Insufficient documentation

## 2018-03-27 DIAGNOSIS — Z8042 Family history of malignant neoplasm of prostate: Secondary | ICD-10-CM

## 2018-03-27 DIAGNOSIS — Z8041 Family history of malignant neoplasm of ovary: Secondary | ICD-10-CM

## 2018-03-27 DIAGNOSIS — Z170 Estrogen receptor positive status [ER+]: Secondary | ICD-10-CM

## 2018-03-27 DIAGNOSIS — C50312 Malignant neoplasm of lower-inner quadrant of left female breast: Secondary | ICD-10-CM

## 2018-03-27 DIAGNOSIS — Z801 Family history of malignant neoplasm of trachea, bronchus and lung: Secondary | ICD-10-CM

## 2018-03-27 HISTORY — DX: Encounter for other screening for genetic and chromosomal anomalies: Z13.79

## 2018-03-27 NOTE — Telephone Encounter (Signed)
Revealed negative genetic testing.  Revealed that a VUS in [facility] was identified.   This normal result indicates that it is unlikely Molly Ross's cancer is due to a hereditary cause.   However, genetic testing is not perfect, and cannot definitively rule out a hereditary cause.  It will be important for her to keep in contact with genetics to learn if any additional testing may be needed in the future.   Recommended her siblings and all maternal relatives also have genetic testing.

## 2018-03-27 NOTE — Progress Notes (Signed)
HPI: [name] was previously seen in the [place] clinic on 03/13/2018 due to a personal and family history of cancer and concerns regarding a hereditary predisposition to cancer. Please refer to our prior cancer genetics clinic note for more information regarding [name]'s medical, social and family histories, and our assessment and recommendations, at the time. [name] recent genetic test results were disclosed to her, as well as recommendations warranted by these results. These results and recommendations are discussed in more detail below.   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%, PR 90%, Ki-67 5%, HER-2 negative, T1 be N0 stage I a clinical stage      03/21/2018 Genetic Testing    The Common Hereditary Cancer Panel offered by [facility] includes sequencing and/or deletion duplication testing of the following 47 genes: APC, ATM, AXIN2, BARD1, BMPR1A, BRCA1, BRCA2, BRIP1, CDH1, CDKN2A (p14ARF), CDKN2A (p16INK4a), CKD4, CHEK2, CTNNA1, DICER1, EPCAM (Deletion/duplication testing only), GREM1 (promoter region deletion/duplication testing only), KIT, MEN1, MLH1, MSH2, MSH3, MSH6, MUTYH, NBN, NF1, NHTL1, PALB2, PDGFRA, PMS2, POLD1, POLE, PTEN, RAD50, RAD51C, RAD51D, SDHB, SDHC, SDHD, SMAD4, SMARCA4. STK11, TP53, TSC1, TSC2, and VHL.  The following genes were evaluated for sequence changes only: SDHA and HOXB13 c.251G>A variant only.  Results: No pathogenic variants identified.   A variant of uncertain significance in the gene SMARCA4 was identified c.5011C>T ([id number]).  The date of this test report is 03/21/2018        FAMILY HISTORY:  We obtained a detailed, 4-generation family history.   Significant diagnoses are listed below: Family History  Problem Relation Age of Onset  . Stroke Mother   . Diabetes Mother   . Stroke Sister 55       brainstem stroke  . Diabetes Sister   . Seizures Sister   . Lung cancer Father 53  . Ovarian cancer Sister 43       metastatic to lung, liver  . Cancer Sister   . Ovarian cancer Maternal Aunt 95  . Heart disease Maternal Grandmother   . Prostate cancer Cousin   . Breast cancer Neg Hx     [name] has a 56 year-old son with no history of cancer.  She has an 97 year-old granddaughter.  [name] has 3 sisters.  One was diagnosed with ovarian cancer at 51.  She reports she had genetic testing done, but does not know the result. She reports her sister is not doing very well with her cancer and she does not feel she can ask her about getting these results/getting permission for [place] to look up these results.  This sister has 3 sons and a daughter.  One of the sons had cancer in his 33's/40's  [name] has 2 other sisters ages 68 and 36 with no history of cancer.  They do not have children.   [name]' father died at 59 and had lung cancer.  [name] has 3 paternal uncles who are deceased, 1 paternal aunt deceased, and 1 paternal aunt 60 years of age.  No history of cancer in these relatives.  No cancer in any of her paternal cousins.  [name]' paternal grandfather died in mid-life due to an accident. Her  paternal grandmother died in her 52's with no history of cancer.   [name]' mother died at 67 and had a history of cervical cancer.  [name] had 1 maternal aunt who died of ovarian cancer at 23.  This aunt had 3 sons and 1 daughter. One of the sons developed prostate cancer and is now in his 54's.  [name] also has 1 maternal uncle with no history of cancer. [name]' maternal grandfather died in his 43's with no history of cancer.  [name]' maternal grandmother died of heart disease- age unk.   Patient's maternal ancestors are of  [place] European descent, and paternal ancestors are of [place] European descent. There is no reported Ashkenazi Jewish ancestry. There is no known consanguinity.  GENETIC TEST RESULTS: Genetic testing performed through [facility]'s Common Hereditary Cancers Panel reported out on 03/21/2018 showed no pathogenic mutations. The Common Hereditary Cancer Panel offered by [facility] includes sequencing and/or deletion duplication testing of the following 47 genes: APC, ATM, AXIN2, BARD1, BMPR1A, BRCA1, BRCA2, BRIP1, CDH1, CDKN2A (p14ARF), CDKN2A (p16INK4a), CKD4, CHEK2, CTNNA1, DICER1, EPCAM (Deletion/duplication testing only), GREM1 (promoter region deletion/duplication testing only), KIT, MEN1, MLH1, MSH2, MSH3, MSH6, MUTYH, NBN, NF1, NHTL1, PALB2, PDGFRA, PMS2, POLD1, POLE, PTEN, RAD50, RAD51C, RAD51D, SDHB, SDHC, SDHD, SMAD4, SMARCA4. STK11, TP53, TSC1, TSC2, and VHL.  The following genes were evaluated for sequence changes only: SDHA and HOXB13 c.251G>A variant only.  A variant of uncertain significance (VUS) in a gene called SMARCA4 was also noted. c.5011C>T [phone])  The test report will be scanned into EPIC and will be located under the Molecular Pathology section of the Results Review tab.A portion of the result report is included below for reference.     We discussed with [name] that because current genetic testing is not perfect, it is possible there may be a gene mutation in one of these genes that current testing cannot detect, but that chance is small. We also discussed, that there could be another gene that has not yet been discovered, or that we have not yet tested, that is responsible for the cancer diagnoses in the family. It is also possible there is a hereditary cause for the cancer in the family that [name] did not inherit and therefore was not identified in her testing.  Therefore, it is important to remain in touch with cancer genetics in the future so that we can continue to  offer [name] the most up to date genetic testing.   Regarding the VUS in SMARCA4: At this time, it is unknown if this variant is associated with increased cancer risk or if this is a normal finding, but most variants such as this get reclassified to being inconsequential. It should not be used to make medical management decisions. With time, we suspect the lab will determine the significance of this variant, if any. If we do learn more about it, we will try to contact [name] to discuss it further. However, it is important to stay in touch with [place] periodically and keep the address and phone number up to date.  ADDITIONAL GENETIC TESTING: We discussed with [name] that there are other genes that are associated with increased cancer risk that can be analyzed. The laboratories that offer this testing look at these additional genes via a hereditary cancer gene panel. Should [name] wish to pursue additional genetic testing, we are happy to discuss and coordinate this testing, at any time.    CANCER SCREENING RECOMMENDATIONS:  This negative result means that we were unable to identify a hereditary cause for her personal and family history of cancer at this time.  While reassuring, this does not definitively rule out a hereditary basis for her cancer. It is still possible that there could be genetic mutations that are undetectable by current technology, or genetic mutations in genes that have not been tested or identified to increase cancer risk.  It is also possible there is a hereditary cause for the cancer in her family that [name] did not inherit and therefore was not identified in her genetic testing.    [name] was advised to continue following the cancer screening guidelines provided by her primary healthcare providers. Other factors such as her personal and family history may still affect her cancer risk.    RECOMMENDATIONS FOR FAMILY MEMBERS: Women in this family might be at some  increased risk of developing cancer, over the general population risk, simply due to the family history of cancer. We recommended women in this family have a yearly mammogram beginning at age 74, or 67 years younger than the earliest onset of cancer, an annual clinical breast exam, and perform monthly breast self-exams. Women in this family should also have a gynecological exam as recommended by their primary provider. All family members should have a colonoscopy by age 22( or as directed by their physicians).  All family members should inform their physicians about the family history of cancer so their doctors can make the most appropriate screening recommendations for them.   Based on [name] family history, we recommended her siblings and maternal relatives, have genetic counseling and testing. [name] will let us know if we can be of any assistance in coordinating genetic counseling and/or testing for *these family members.   FOLLOW-UP: Lastly, we discussed with [name] that cancer genetics is a rapidly advancing field and it is possible that new genetic tests will be appropriate for her and/or her family members in the future. We encouraged her to remain in contact with cancer genetics on an annual basis so we can update her personal and family histories and let her know of advances in cancer genetics that may benefit this family.   Our contact number was provided. [name] questions were answered to her satisfaction, and she knows she is welcome to call us at anytime with additional questions or concerns.   Molly Luz, MS, [facility] Certified Genetic Counselor [email or web address]

## 2018-03-31 NOTE — Progress Notes (Signed)
Patient Care Team: Rita Ohara, MD as PCP - General (Family Medicine) Stark Klein, MD as Consulting Physician (General Surgery) Nicholas Lose, MD as Consulting Physician (Hematology and Oncology) Eppie Gibson, MD as Attending Physician (Radiation Oncology)  DIAGNOSIS:  Encounter Diagnosis  Name Primary?  . 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%, PR 90%, Ki-67 5%, HER-2 negative, T1 be N0 stage I a clinical stage      03/19/2018 Surgery    Left Lumpectomy: IDC Grade 1, 1.3 cm, 1/3 LN Positive, with LVI, T1cN1 ER 100%, PR 90%, Ki-67 5%, HER-2 negative Stage 1B      03/21/2018 Genetic Testing    The Common Hereditary Cancer Panel offered by [facility] includes sequencing and/or deletion duplication testing of the following 47 genes: APC, ATM, AXIN2, BARD1, BMPR1A, BRCA1, BRCA2, BRIP1, CDH1, CDKN2A (p14ARF), CDKN2A (p16INK4a), CKD4, CHEK2, CTNNA1, DICER1, EPCAM (Deletion/duplication testing only), GREM1 (promoter region deletion/duplication testing only), KIT, MEN1, MLH1, MSH2, MSH3, MSH6, MUTYH, NBN, NF1, NHTL1, PALB2, PDGFRA, PMS2, POLD1, POLE, PTEN, RAD50, RAD51C, RAD51D, SDHB, SDHC, SDHD, SMAD4, SMARCA4. STK11, TP53, TSC1, TSC2, and VHL.  The following genes were evaluated for sequence changes only: SDHA and HOXB13 c.251G>A variant only.  Results: No pathogenic variants identified.   A variant of uncertain significance in the gene SMARCA4 was identified c.5011C>T ([id number]).  The date of this test report is 03/21/2018       CHIEF COMPLIANT: Follow-up after recent left lumpectomy    INTERVAL HISTORY: Molly Ross is a 77 year old with above-mentioned history of left breast cancer treated with left lumpectomy.  She is here today to discuss the final pathology report.  She is recovering very well from surgery.  Complains of mild pain and discomfort.  REVIEW OF SYSTEMS:   Constitutional: Denies fevers, chills or abnormal weight loss Eyes: Denies blurriness of vision Ears, nose, mouth, throat, and face: Denies mucositis or sore throat Respiratory: Denies cough, dyspnea or wheezes Cardiovascular: Denies palpitation, chest discomfort Gastrointestinal:  Denies nausea, heartburn or change in bowel habits Skin: Denies abnormal skin rashes Lymphatics: Denies new lymphadenopathy or easy bruising Neurological:Denies numbness, tingling or new weaknesses Behavioral/Psych: Mood is stable, no new changes  Extremities: No lower extremity edema Breast:  denies any pain or lumps or nodules in either breasts All other systems were reviewed with the patient and are negative.  I have reviewed the past medical history, past surgical history, social history and family history with the patient and they are unchanged from previous note.  ALLERGIES:  has No Known Allergies.  MEDICATIONS:  Current Outpatient Medications  Medication Sig Dispense Refill  . Ascorbic Acid (VITAMIN C) 100 MG tablet Take 1 tablet (100 mg total) by mouth daily.    Marland Kitchen aspirin EC 81 MG tablet Take 81 mg by mouth daily.    Marland Kitchen atorvastatin (LIPITOR) 20 MG tablet Take 1 tablet (20 mg total) by mouth daily. 90 tablet 1  . Calcium-Magnesium-Vitamin D (CALCIUM 500 PO) Take 1 tablet by mouth daily.    . fish oil-omega-3 fatty acids 1000 MG capsule Take 3 g by mouth daily.     Marland Kitchen  fluticasone furoate-vilanterol (BREO ELLIPTA) 100-25 MCG/INH AEPB Inhale 1 puff into the lungs daily. 1 each 5  . Multiple Vitamins-Minerals (MULTIVITAMIN WITH MINERALS) tablet Take 1 tablet by mouth daily.     No current facility-administered  medications for this visit.     PHYSICAL EXAMINATION: ECOG PERFORMANCE STATUS: 1 - Symptomatic but completely ambulatory  Vitals:   04/01/18 1151  BP: (!) 128/57  Pulse: 72  Resp: 17  Temp: 98.4 F (36.9 C)  SpO2: 98%   Filed Weights   04/01/18 1151  Weight: 145 lb 6.4 oz (66 kg)    GENERAL:alert, no distress and comfortable SKIN: skin color, texture, turgor are normal, no rashes or significant lesions EYES: normal, Conjunctiva are pink and non-injected, sclera clear OROPHARYNX:no exudate, no erythema and lips, buccal mucosa, and tongue normal  NECK: supple, thyroid normal size, non-tender, without nodularity LYMPH:  no palpable lymphadenopathy in the cervical, axillary or inguinal LUNGS: clear to auscultation and percussion with normal breathing effort HEART: regular rate & rhythm and no murmurs and no lower extremity edema ABDOMEN:abdomen soft, non-tender and normal bowel sounds MUSCULOSKELETAL:no cyanosis of digits and no clubbing  NEURO: alert & oriented x 3 with fluent speech, no focal motor/sensory deficits EXTREMITIES: No lower extremity edema  LABORATORY DATA:  I have reviewed the data as listed CMP Latest Ref Rng & Units 03/11/2018 01/05/2018 09/01/2017  Glucose 70 - 140 mg/dL 91 83 87  BUN 7 - 26 mg/dL 21 - 12  Creatinine 0.60 - 1.10 mg/dL 0.69 - 0.56(L)  Sodium 136 - 145 mmol/L 141 - 142  Potassium 3.5 - 5.1 mmol/L 4.2 - 4.9  Chloride 98 - 109 mmol/L 104 - 104  CO2 22 - 29 mmol/L 28 - 30  Calcium 8.4 - 10.4 mg/dL 10.4 - 9.8  Total Protein 6.4 - 8.3 g/dL 7.7 - 7.1  Total Bilirubin 0.2 - 1.2 mg/dL 1.0 - 0.9  Alkaline Phos 40 - 150 U/L 96 - -  AST 5 - 34 U/L 18 - 18  ALT 0 - 55 U/L 11 - 10    Lab Results  Component Value Date   WBC 8.1 03/11/2018   HGB 12.8 05/16/2017   HCT 39.9 03/11/2018   MCV 91.7 03/11/2018   PLT 311 03/11/2018   NEUTROABS 5.3 03/11/2018    ASSESSMENT & PLAN:  Malignant neoplasm of lower-inner quadrant of left breast in female,  estrogen receptor positive ([place]) 03/19/18: Left Lumpectomy: IDC Grade 1, 1.3 cm, 1/3 LN Positive, with LVI, T1cN1 ER 100%, PR 90%, Ki-67 5%, HER-2 negative Stage 1B  Pathology counseling: I discussed the final pathology report of the patient provided  a copy of this report. I discussed the margins as well as lymph node surgeries. We also discussed the final staging along with previously performed ER/PR and HER-2/neu testing.  Recommendation:  1.  Because of lymph node involvement, I will request Dr. Isidore Moos to see the patient back to discuss the pros and cons of adjuvant radiation. 2. antiestrogen therapy with aromatase inhibitors letrozole 2.5 mg daily times 5 years  Return to clinic at the end of radiation to see me back    Orders Placed This Encounter  Procedures  . Ambulatory referral to Radiation Oncology    Referral Priority:   Routine    Referral Type:   Consultation    Referral Reason:   Specialty Services Required    Referred to Provider:   Eppie Gibson, MD    Requested Specialty:   Radiation Oncology  Number of Visits Requested:   1   The patient has a good understanding of the overall plan. she agrees with it. she will call with any problems that may develop before the next visit here.   Molly Ohara, MD 04/01/18

## 2018-03-31 NOTE — Assessment & Plan Note (Signed)
03/19/18: Left Lumpectomy: IDC Grade 1, 1.3 cm, 1/3 LN Positive, with LVI, T1cN1 ER 100%, PR 90%, Ki-67 5%, HER-2 negative Stage 1B  Pathology counseling: I discussed the final pathology report of the patient provided  a copy of this report. I discussed the margins as well as lymph node surgeries. We also discussed the final staging along with previously performed ER/PR and HER-2/neu testing.  Plan: antiestrogen therapy with aromatase inhibitors letrozole 2.5 mg daily times 5 years  Radiation oncology discussed the pros and cons of radiation and determined that she does not get much benefit from radiation therapy    RTC in 3 months for Tox check and SCP visit

## 2018-04-01 ENCOUNTER — Inpatient Hospital Stay: Payer: Medicare Other | Attending: Hematology and Oncology | Admitting: Hematology and Oncology

## 2018-04-01 DIAGNOSIS — Z170 Estrogen receptor positive status [ER+]: Secondary | ICD-10-CM

## 2018-04-01 DIAGNOSIS — C773 Secondary and unspecified malignant neoplasm of axilla and upper limb lymph nodes: Secondary | ICD-10-CM | POA: Diagnosis not present

## 2018-04-01 DIAGNOSIS — C50312 Malignant neoplasm of lower-inner quadrant of left female breast: Secondary | ICD-10-CM | POA: Diagnosis not present

## 2018-04-01 MED ORDER — VITAMIN C 100 MG PO TABS: 100.0000 mg | ORAL_TABLET | Freq: Every day | ORAL | Status: AC

## 2018-04-02 ENCOUNTER — Encounter: Payer: Self-pay | Admitting: Radiation Oncology

## 2018-04-02 NOTE — Progress Notes (Addendum)
Location of Breast Cancer: Left Breast  Histology per Pathology Report:  03/03/18 Diagnosis Breast, left, needle core biopsy, 8 o'clock - INVASIVE MAMMARY CARCINOMA.  Receptor Status: ER(100%), PR (90%), Her2-neu (NEG), Ki-(5%)  03/19/18 Diagnosis 1. Breast, lumpectomy, Left - INVASIVE DUCTAL CARCINOMA, GRADE I/III, SPANNING 1.3 CM. - DUCTAL CARCINOMA IN SITU, LOW GRADE. - LYMPHOVASCULAR INVASION IS IDENTIFIED. - THE SURGICAL RESECTION MARGINS ARE NEGATIVE FOR CARCINOMA. - SEE ONCOLOGY TABLE BELOW. 2. Lymph node, sentinel, biopsy, Left axillary - METASTATIC CARCINOMA IN 1 OF 1 LYMPH NODE (1/1). - SEE COMMENT. 3. Lymph node, sentinel, biopsy, Left axillary - THERE IS NO EVIDENCE OF CARCINOMA IN 1 OF 1 LYMPH NODE (0/1). - SEE COMMENT. 4. Lymph node, sentinel, biopsy, Left axillary - THERE IS NO EVIDENCE OF CARCINOMA IN 1 OF 1 LYMPH NODE (0/1).  Did patient present with symptoms or was this found on screening mammography?: It was discovered on a screening mammogram.   Past/Anticipated interventions by surgeon, if any: 03/19/18 Dr. Barry Dienes Left Breast Radioactive seed localized lumpectomy and sentinel lymph node biopsy   Past/Anticipated interventions by medical oncology, if any: 04/02/18 Dr. Lindi Adie Recommendation:  1.  Because of lymph node involvement, I will request Dr. Isidore Moos to see the patient back to discuss the pros and cons of adjuvant radiation. 2. antiestrogen therapy with aromatase inhibitors letrozole 2.5 mg daily times 5 years  Return to clinic at the end of radiation to see me back   Lymphedema issues, if any:  She denies. She has good arm mobility.   Pain issues, if any:  She reports soreness to her surgery site.   SAFETY ISSUES:  Prior radiation? No  Pacemaker/ICD? No  Possible current pregnancy? No  Is the patient on methotrexate? No  Current Complaints / other details:   She has a beach trip planned for 05/19/18- 05/22/18 (Tuesday through Friday).   BP  (!) 136/54   Pulse (!) 58   Temp 97.9 F (36.6 C)   Resp 17   Ht _0  (1.753 m)   Wt 145 lb 12.8 oz (66.1 kg)   SpO2 98% Comment: room air  BMI 21.53 kg/m    Wt Readings from Last 3 Encounters:  04/10/18 145 lb 12.8 oz (66.1 kg)  04/01/18 145 lb 6.4 oz (66 kg)  03/19/18 141 lb (64 kg)      Molly Ross, [name], RN 04/02/2018,7:46 AM

## 2018-04-10 ENCOUNTER — Ambulatory Visit
Admission: RE | Admit: 2018-04-10 | Discharge: 2018-04-10 | Disposition: A | Discharge status: Home / Self Care | Payer: Medicare Other | Source: Ambulatory Visit | Attending: Radiation Oncology | Admitting: Radiation Oncology

## 2018-04-10 ENCOUNTER — Telehealth: Payer: Self-pay | Admitting: *Deleted

## 2018-04-10 ENCOUNTER — Other Ambulatory Visit: Payer: Self-pay

## 2018-04-10 ENCOUNTER — Encounter: Payer: Self-pay | Admitting: Radiation Oncology

## 2018-04-10 VITALS — BP 136/54 | HR 58 | Temp 97.9°F | Resp 17 | Ht 69.0 in | Wt 145.8 lb

## 2018-04-10 DIAGNOSIS — C773 Secondary and unspecified malignant neoplasm of axilla and upper limb lymph nodes: Secondary | ICD-10-CM | POA: Diagnosis not present

## 2018-04-10 DIAGNOSIS — Z7982 Long term (current) use of aspirin: Secondary | ICD-10-CM | POA: Diagnosis not present

## 2018-04-10 DIAGNOSIS — Z79899 Other long term (current) drug therapy: Secondary | ICD-10-CM | POA: Diagnosis not present

## 2018-04-10 DIAGNOSIS — Z170 Estrogen receptor positive status [ER+]: Secondary | ICD-10-CM | POA: Insufficient documentation

## 2018-04-10 DIAGNOSIS — Z9889 Other specified postprocedural states: Secondary | ICD-10-CM | POA: Diagnosis not present

## 2018-04-10 DIAGNOSIS — C50312 Malignant neoplasm of lower-inner quadrant of left female breast: Secondary | ICD-10-CM

## 2018-04-10 DIAGNOSIS — R918 Other nonspecific abnormal finding of lung field: Secondary | ICD-10-CM | POA: Insufficient documentation

## 2018-04-10 NOTE — Telephone Encounter (Signed)
  Oncology Nurse Navigator Documentation  Navigator Location: [facility] (04/10/18 1000)   )        Surgery Date: 03/19/18 (04/10/18 1000)           Treatment Initiated Date: 03/19/18 (04/10/18 1000) Patient Visit Type: RadOnc (04/10/18 1000) Treatment Phase: Post-Tx Follow-up (04/10/18 1000)                  Acuity: Level 1 (04/10/18 1000)

## 2018-04-10 NOTE — Progress Notes (Signed)
Radiation Oncology         [phone] ________________________________  Name: Molly Ross MRN: [id number]  Date: 04/10/2018  DOB: [birth year]  Follow-Up Visit Note  Outpatient  CC: Molly Ohara, MD  Molly Lose, MD  Diagnosis:      ICD-10-CM   1. Malignant neoplasm of lower-inner quadrant of left breast in female, estrogen receptor positive ([place]) C50.312    Z17.0    Malignant neoplasm of lower-inner quadrant of left breast in female, estrogen receptor positive ([place]). Left Lumpectomy: IDC Grade 1, 1.3 cm, 1/3 LN Positive, with LVI, pT1c N1 cM0 ER 100%, PR 90%, Ki-67 5%, HER-2 negative   Cancer Staging Malignant neoplasm of lower-inner quadrant of left breast in female, estrogen receptor positive ([place]) Staging form: Breast, AJCC 8th Edition - Clinical stage from 03/11/2018: Stage IA (cT1c, cN0, cM0, G2, ER+, PR+, HER2-) - Unsigned - Pathologic: Stage IA (pT1c, pN1a, cM0, G1, ER+, PR+, HER2-) - Signed by Molly Gibson, MD on 04/10/2018   CHIEF COMPLAINT: Here to discuss management of left breast cancer  Narrative:  The patient returns today for follow-up.  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   On 03/19/18 patient underwent left lumpectomy and sentinel node biopsy which demonstrated, IDC Grade 1, 1.3cm, Margins negative by >84m, 1/3 LN positive with LVI, T1c N1 ER 100%, PR 90%, Ki-67 5%, HER-2 negative stage 1B.  Due to the lymph node involvement, Molly Ross kindly referred Molly Ross discuss radiotherapy. She anticipates antiestrogen therapy eventually. She denies any lymphedema issues and expressed having good arm mobility.           ALLERGIES:  has No Known Allergies.  Meds: Current Outpatient Medications  Medication Sig Dispense  Refill  . Ascorbic Acid (VITAMIN C) 100 MG tablet Take 1 tablet (100 mg total) by mouth daily.    .Marland Kitchenaspirin EC 81 MG tablet Take 81 mg by mouth daily.    .Marland Kitchenatorvastatin (LIPITOR) 20 MG tablet Take 1 tablet (20 mg total) by mouth daily. 90 tablet 1  . Calcium-Magnesium-Vitamin D (CALCIUM 500 PO) Take 1 tablet by mouth daily.    . fish oil-omega-3 fatty acids 1000 MG capsule Take 3 g by mouth daily.     . fluticasone furoate-vilanterol (BREO ELLIPTA) 100-25 MCG/INH AEPB Inhale 1 puff into the lungs daily. 1 each 5  . Multiple Vitamins-Minerals (MULTIVITAMIN WITH MINERALS) tablet Take 1 tablet by mouth daily.     No current facility-administered medications for this encounter.     Physical Findings:  height is _0  (1.753 m) and weight is 145 lb 12.8 oz (66.1 kg). Her temperature is 97.9 F (36.6 C). Her blood pressure is 136/54 (abnormal) and her pulse is 58 (abnormal). Her respiration is 17 and oxygen saturation is 98%. .    Skin reveals: chest with numerous seborrhagicus keratosis over her breast.  Breast exam: left breast and axially scars are healing well. No significant swelling was noted. EXT: good ROM in shoulder Gen: NAD, well appearing Psych: affect is appropriate   Lab Findings: Lab Results  Component Value Date   WBC 8.1 03/11/2018   HGB 12.8 03/11/2018   HCT 39.9 03/11/2018   MCV 91.7 03/11/2018   PLT 311 03/11/2018    _1 @  Radiographic Findings: Dg Chest 2  View  Result Date: 03/17/2018 CLINICAL DATA:  77 year old female with a history of preoperative test for lumpectomy. EXAM: CHEST - 2 VIEW COMPARISON:  Chest x-ray 05/16/2017, CT 04/15/2016 FINDINGS: Cardiomediastinal silhouette unchanged in size and contour. No evidence of new interlobular septal thickening. No pneumothorax or pleural effusion. Similar pattern of reticulonodular opacity throughout the lungs, with evidence of bronchiectasis and peribronchial thickening. No new confluent airspace disease.  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 Electronically Signed   By: Corrie Mckusick D.O.   On: 03/17/2018 10:18   Nm Sentinel Node Inj-no Rpt (breast)  Result Date: 03/19/2018 Sulfur colloid was injected by the nuclear medicine technologist for melanoma sentinel node.   Mm Breast Surgical Specimen  Result Date: 03/19/2018 CLINICAL DATA:  Status post left lumpectomy for breast cancer. EXAM: SPECIMEN RADIOGRAPH OF THE LEFT BREAST COMPARISON:  Previous exam(s). FINDINGS: Status post excision of the left breast. The radioactive seed and heart shaped biopsy marker clip are present, completely intact, and were marked for pathology. IMPRESSION: Specimen radiograph of the left breast. Electronically Signed   By: Abelardo Diesel M.D.   On: 03/19/2018 10:13   Mm Lt Radioactive Seed Loc Mammo Guide  Result Date: 03/17/2018 CLINICAL DATA:  Patient with LEFT breast cancer scheduled for breast conservation surgery requiring preoperative radioactive seed localization. EXAM: MAMMOGRAPHIC GUIDED RADIOACTIVE SEED LOCALIZATION OF THE LEFT BREAST COMPARISON:  Previous exam(s). FINDINGS: Patient presents for radioactive seed localization prior to breast conservation surgery. I met with the patient and we discussed the procedure of seed localization including benefits and alternatives. We discussed the high likelihood of a successful procedure. We discussed the risks of the procedure including infection, bleeding, tissue injury and further surgery. We discussed the low dose of radioactivity involved in the procedure. Informed, written consent was given. The usual time-out protocol was performed immediately prior to the procedure. Using mammographic guidance, sterile technique, 1% lidocaine and an I-125 radioactive seed, the heart shaped clip within the lower inner quadrant of the LEFT breast was localized using a  medial approach. The follow-up mammogram images confirm the seed in the expected location and were marked for Dr. Barry Dienes. Follow-up survey of the patient confirms presence of the radioactive seed. Order number of I-125 seed:  [id number]. Total activity:  2.637 millicuries reference Date: 03/03/2018 The patient tolerated the procedure well and was released from the [facility]. She was given instructions regarding seed removal. IMPRESSION: Radioactive seed localization left breast. No apparent complications. Electronically Signed   By: Franki Cabot M.D.   On: 03/17/2018 14:55    Impression/Plan: Left breast cancer, LN clinically negative, pathologically +  We discussed adjuvant radiotherapy today.  I recommend high tangential radiotherapy to the left breast and axilla over 4 weeks in order to reduce risk of locoregional recurrence by 2/3.  The risks, benefits and side effects of this treatment were discussed in detail.  She understands that radiotherapy is associated with skin irritation and fatigue in the acute setting. Late effects can include cosmetic changes and rare injury to internal organs.   She is enthusiastic about proceeding with treatment. A consent form has been  signed and placed in her chart.  A total of 3 medically necessary complex treatment devices will be fabricated and supervised by me: 2 fields with MLCs for custom blocks to protect heart, and lungs;  and, a Vac-lok. MORE COMPLEX DEVICES MAY BE MADE IN DOSIMETRY FOR FIELD  IN FIELD BEAMS FOR DOSE HOMOGENEITY.  I have requested : 3D Simulation which is medically necessary to give adequate dose to at risk tissues while sparing lungs and heart.  I have requested a DVH of the following structures: lungs, heart, left lumpectomy cavity.    The patient will receive 42.56 Gy in 16 fractions to the left breast/axilla with 2 fields.  This will be followed by a boost.  Anticipate CT simulation on Monday 04/13/18 and starting treatment on May 2nd,  to allow her to complete treatment before patient's planned beach treatment on June 4th.  I spent 25 minutes face to face with the patient and more than 50% of that time was spent in counseling and/or coordination of care.   _____________________________________   Molly Gibson, MD  This document serves as a record of services personally performed by Molly Gibson MD. It was created on her behalf by Delton Coombes, a trained medical scribe. The creation of this record is based on the scribe's personal observations and the provider's statements to them.

## 2018-04-13 ENCOUNTER — Ambulatory Visit
Admission: RE | Admit: 2018-04-13 | Discharge: 2018-04-13 | Disposition: A | Discharge status: Home / Self Care | Payer: Medicare Other | Source: Ambulatory Visit | Attending: Radiation Oncology | Admitting: Radiation Oncology

## 2018-04-13 DIAGNOSIS — Z170 Estrogen receptor positive status [ER+]: Secondary | ICD-10-CM | POA: Insufficient documentation

## 2018-04-13 DIAGNOSIS — C50312 Malignant neoplasm of lower-inner quadrant of left female breast: Secondary | ICD-10-CM | POA: Diagnosis not present

## 2018-04-13 DIAGNOSIS — Z510 Encounter for antineoplastic radiation therapy: Secondary | ICD-10-CM | POA: Insufficient documentation

## 2018-04-13 NOTE — Progress Notes (Signed)
  Radiation Oncology         [phone] ________________________________  Name: SHIELA BRUNS MRN: [id number]  Date: 04/13/2018  DOB: 15-Jul-1941  SIMULATION AND TREATMENT PLANNING NOTE    Outpatient  DIAGNOSIS:     ICD-10-CM   1. Malignant neoplasm of lower-inner quadrant of left breast in female, estrogen receptor positive ([place]) C50.312    Z17.0     NARRATIVE:  The patient was brought to the [facility].  Identity was confirmed.  All relevant records and images related to the planned course of therapy were reviewed.  The patient freely provided informed written consent to proceed with treatment after reviewing the details related to the planned course of therapy. The consent form was witnessed and verified by the simulation staff.    Then, the patient was set-up in a stable reproducible supine position for radiation therapy with her ipsilateral arm over her head, and her upper body secured in a custom-made Vac-lok device.  CT images were obtained.  Surface markings were placed.  The CT images were loaded into the planning software.    TREATMENT PLANNING NOTE: Treatment planning then occurred.  The radiation prescription was entered and confirmed.     A total of 3 medically necessary complex treatment devices were fabricated and supervised by me: 2 fields with MLCs for custom blocks to protect heart, and lungs;  and, a Vac-lok. MORE COMPLEX DEVICES MAY BE MADE IN DOSIMETRY FOR FIELD IN FIELD BEAMS FOR DOSE HOMOGENEITY.  I have requested : 3D Simulation which is medically necessary to give adequate dose to at risk tissues while sparing lungs and heart.  I have requested a DVH of the following structures: lungs, heart, lumpectomy cavity.    The patient will receive 42.56 Gy in 16 fractions to the left breast and axilla with 2 high tangential fields.  This will be followed by a boost.  Optical Surface Tracking Plan:  Since intensity modulated radiotherapy (IMRT) and 3D  conformal radiation treatment methods are predicated on accurate and precise positioning for treatment, intrafraction motion monitoring is medically necessary to ensure accurate and safe treatment delivery. The ability to quantify intrafraction motion without excessive ionizing radiation dose can only be performed with optical surface tracking. Accordingly, surface imaging offers the opportunity to obtain 3D measurements of patient position throughout IMRT and 3D treatments without excessive radiation exposure. I am ordering optical surface tracking for this patient's upcoming course of radiotherapy.  ________________________________   Reference:  Ursula Alert, J, et al. Surface imaging-based analysis of intrafraction motion for breast radiotherapy patients.Journal of [place], n. 6, nov. 2014. ISSN [id number].  Available at: <http://www.jacmp.org/index.php/jacmp/article/view/4957>.    -----------------------------------  Eppie Gibson, MD

## 2018-04-14 DIAGNOSIS — C50312 Malignant neoplasm of lower-inner quadrant of left female breast: Secondary | ICD-10-CM | POA: Diagnosis not present

## 2018-04-14 DIAGNOSIS — Z510 Encounter for antineoplastic radiation therapy: Secondary | ICD-10-CM | POA: Diagnosis not present

## 2018-04-14 DIAGNOSIS — Z170 Estrogen receptor positive status [ER+]: Secondary | ICD-10-CM | POA: Diagnosis not present

## 2018-04-15 ENCOUNTER — Telehealth: Payer: Self-pay | Admitting: Hematology and Oncology

## 2018-04-15 DIAGNOSIS — H26493 Other secondary cataract, bilateral: Secondary | ICD-10-CM | POA: Diagnosis not present

## 2018-04-15 NOTE — Telephone Encounter (Signed)
Mailed patient calendar of upcoming may appointments per 4/29 sch message.

## 2018-04-16 ENCOUNTER — Ambulatory Visit
Admission: RE | Admit: 2018-04-16 | Discharge: 2018-04-16 | Disposition: A | Discharge status: Home / Self Care | Payer: Medicare Other | Source: Ambulatory Visit | Attending: Radiation Oncology | Admitting: Radiation Oncology

## 2018-04-16 DIAGNOSIS — Z510 Encounter for antineoplastic radiation therapy: Secondary | ICD-10-CM | POA: Insufficient documentation

## 2018-04-16 DIAGNOSIS — C50312 Malignant neoplasm of lower-inner quadrant of left female breast: Secondary | ICD-10-CM | POA: Diagnosis not present

## 2018-04-16 DIAGNOSIS — Z170 Estrogen receptor positive status [ER+]: Secondary | ICD-10-CM | POA: Diagnosis not present

## 2018-04-17 ENCOUNTER — Ambulatory Visit
Admission: RE | Admit: 2018-04-17 | Discharge: 2018-04-17 | Disposition: A | Discharge status: Home / Self Care | Payer: Medicare Other | Source: Ambulatory Visit | Attending: Radiation Oncology | Admitting: Radiation Oncology

## 2018-04-17 DIAGNOSIS — Z170 Estrogen receptor positive status [ER+]: Secondary | ICD-10-CM | POA: Diagnosis not present

## 2018-04-17 DIAGNOSIS — C50312 Malignant neoplasm of lower-inner quadrant of left female breast: Secondary | ICD-10-CM | POA: Diagnosis not present

## 2018-04-17 DIAGNOSIS — H26493 Other secondary cataract, bilateral: Secondary | ICD-10-CM | POA: Diagnosis not present

## 2018-04-17 DIAGNOSIS — Z510 Encounter for antineoplastic radiation therapy: Secondary | ICD-10-CM | POA: Diagnosis not present

## 2018-04-20 ENCOUNTER — Ambulatory Visit
Admission: RE | Admit: 2018-04-20 | Discharge: 2018-04-20 | Disposition: A | Discharge status: Home / Self Care | Payer: Medicare Other | Source: Ambulatory Visit | Attending: Radiation Oncology | Admitting: Radiation Oncology

## 2018-04-20 DIAGNOSIS — C50312 Malignant neoplasm of lower-inner quadrant of left female breast: Secondary | ICD-10-CM

## 2018-04-20 DIAGNOSIS — Z510 Encounter for antineoplastic radiation therapy: Secondary | ICD-10-CM | POA: Diagnosis not present

## 2018-04-20 DIAGNOSIS — Z170 Estrogen receptor positive status [ER+]: Principal | ICD-10-CM

## 2018-04-20 MED ORDER — ALRA NON-METALLIC DEODORANT (RAD-ONC)
1.0000 "application " | Freq: Once | TOPICAL | Status: AC
  Administered 2018-04-20: 1 via TOPICAL

## 2018-04-20 MED ORDER — RADIAPLEXRX EX GEL
Freq: Once | CUTANEOUS | Status: AC
  Administered 2018-04-20: 15:00:00 via TOPICAL

## 2018-04-20 NOTE — Progress Notes (Signed)
Pt here for patient teaching.  Pt given Radiation and You booklet, skin care instructions, Alra deodorant and Radiaplex gel.  Reviewed areas of pertinence such as fatigue, hair loss, skin changes, breast tenderness and breast swelling . Pt able to give teach back of to pat skin and use unscented/gentle soap,apply Radiaplex bid, avoid applying anything to skin within 4 hours of treatment, avoid wearing an under wire bra and to use an electric razor if they must shave. Pt verbalizes understanding of information given and will contact nursing with any questions or concerns.    Cori Razor, RN

## 2018-04-21 ENCOUNTER — Ambulatory Visit
Admission: RE | Admit: 2018-04-21 | Discharge: 2018-04-21 | Disposition: A | Discharge status: Home / Self Care | Payer: Medicare Other | Source: Ambulatory Visit | Attending: Radiation Oncology | Admitting: Radiation Oncology

## 2018-04-21 DIAGNOSIS — Z170 Estrogen receptor positive status [ER+]: Secondary | ICD-10-CM | POA: Diagnosis not present

## 2018-04-21 DIAGNOSIS — C50312 Malignant neoplasm of lower-inner quadrant of left female breast: Secondary | ICD-10-CM | POA: Diagnosis not present

## 2018-04-21 DIAGNOSIS — Z510 Encounter for antineoplastic radiation therapy: Secondary | ICD-10-CM | POA: Diagnosis not present

## 2018-04-22 ENCOUNTER — Ambulatory Visit
Admission: RE | Admit: 2018-04-22 | Discharge: 2018-04-22 | Disposition: A | Discharge status: Home / Self Care | Payer: Medicare Other | Source: Ambulatory Visit | Attending: Radiation Oncology | Admitting: Radiation Oncology

## 2018-04-22 DIAGNOSIS — Z510 Encounter for antineoplastic radiation therapy: Secondary | ICD-10-CM | POA: Diagnosis not present

## 2018-04-22 DIAGNOSIS — Z170 Estrogen receptor positive status [ER+]: Secondary | ICD-10-CM | POA: Diagnosis not present

## 2018-04-22 DIAGNOSIS — C50312 Malignant neoplasm of lower-inner quadrant of left female breast: Secondary | ICD-10-CM | POA: Diagnosis not present

## 2018-04-23 ENCOUNTER — Ambulatory Visit: Payer: Medicare Other | Attending: General Surgery | Admitting: Physical Therapy

## 2018-04-23 ENCOUNTER — Ambulatory Visit
Admission: RE | Admit: 2018-04-23 | Discharge: 2018-04-23 | Disposition: A | Discharge status: Home / Self Care | Payer: Medicare Other | Source: Ambulatory Visit | Attending: Radiation Oncology | Admitting: Radiation Oncology

## 2018-04-23 ENCOUNTER — Encounter: Payer: Self-pay | Admitting: Physical Therapy

## 2018-04-23 ENCOUNTER — Other Ambulatory Visit: Payer: Self-pay

## 2018-04-23 DIAGNOSIS — Z510 Encounter for antineoplastic radiation therapy: Secondary | ICD-10-CM | POA: Diagnosis not present

## 2018-04-23 DIAGNOSIS — C50312 Malignant neoplasm of lower-inner quadrant of left female breast: Secondary | ICD-10-CM | POA: Diagnosis not present

## 2018-04-23 DIAGNOSIS — Z170 Estrogen receptor positive status [ER+]: Secondary | ICD-10-CM | POA: Diagnosis not present

## 2018-04-23 DIAGNOSIS — R293 Abnormal posture: Secondary | ICD-10-CM | POA: Diagnosis not present

## 2018-04-23 DIAGNOSIS — Z483 Aftercare following surgery for neoplasm: Secondary | ICD-10-CM | POA: Insufficient documentation

## 2018-04-23 NOTE — Therapy (Signed)
[place], [place], [address] Phone: [phone]   Fax:  [phone]  Physical Therapy Treatment  Patient Details  Name: Molly Ross MRN: [id number] Date of Birth: 1941-08-09 Referring Provider: Dr. Stark Klein   Encounter Date: 04/23/2018  PT End of Session - 04/23/18 0836    Visit Number  2    Number of Visits  2    PT Start Time  0800    PT Stop Time  0840    PT Time Calculation (min)  40 min    Activity Tolerance  Patient tolerated treatment well    Behavior During Therapy  [facility] for tasks assessed/performed       Past Medical History:  Diagnosis Date  . Bronchiectasis ([place])   . Cancer ([place])   . Colon polyp   . Dyspnea   . Family history of lung cancer   . Family history of ovarian cancer   . Family history of prostate cancer   . GERD (gastroesophageal reflux disease)   . Shingles 1/09 and 07/2010  . Unspecified vitamin D deficiency 02/2011 and 2016    Past Surgical History:  Procedure Laterality Date  . BREAST LUMPECTOMY WITH RADIOACTIVE SEED AND SENTINEL LYMPH NODE BIOPSY Left 03/19/2018   Procedure: BREAST LUMPECTOMY WITH RADIOACTIVE SEED AND SENTINEL LYMPH NODE BIOPSY;  Surgeon: Stark Klein, MD;  Location: [facility];  Service: General;  Laterality: Left;  . CATARACT EXTRACTION, BILATERAL Bilateral 03/2017, 04/2017   [facility], Dr. Alanda Slim  . SHOULDER SURGERY  03/2011   for frozen shoulder, and cyst removal (Dr. Percell Miller)  . TONSILLECTOMY  child  . VIDEO BRONCHOSCOPY Bilateral 06/21/2016   Procedure: VIDEO BRONCHOSCOPY WITHOUT FLUORO;  Surgeon: Brand Males, MD;  Location: [facility];  Service: Cardiopulmonary;  Laterality: Bilateral;    There were no vitals filed for this visit.  Subjective Assessment - 04/23/18 0806    Subjective  Patient underwent a left lumpectomy and sentinel node biopsy (1/3 positive) on 03/19/18. She does not need chemo but is undergoing  left breast and axillary radiation and will undergo anti-estrogen therapy.    Pertinent History  Patient was diagnosed on 02/18/18 with left grade 2 invasive ductal carcinoma breast cancer. It measured 1.2 cm and is located in the lower inner quadrant. It was ER/PR positive and HER2 negative with a Ki67 of 5%. She had a previous left shoulder scope in 2012. Patient underwent a left lumpectomy and sentinel node biopsy (1/3 positive) on 03/19/18.     Patient Stated Goals  Get rid of my arm nerve pain    Currently in Pain?  Yes    Pain Score  4     Pain Location  Axilla    Pain Orientation  Left    Pain Descriptors / Indicators  -- Nerve type pain    Pain Type  Surgical pain    Pain Onset  1 to 4 weeks ago    Pain Frequency  Intermittent    Aggravating Factors   Reaching overhead    Pain Relieving Factors  Rest    Multiple Pain Sites  No         [facility] PT Assessment - 04/23/18 0001      Assessment   Medical Diagnosis  s/p left lumpectomy and SLNB    Referring Provider  Dr. Stark Klein    Onset Date/Surgical Date  03/19/18    Hand Dominance  Right    Prior Therapy  Baseline assessment      Precautions   Precautions  Other (comment)    Precaution Comments  recent breast cancer surgery; left arm lymphedema risk      Restrictions   Weight Bearing Restrictions  No      Prior Function   Level of Independence  Independent    Vocation  Part time employment    Scientist, forensic associate at [employer]  Not exercising      Cognition   Overall Cognitive Status  Within Functional Limits for tasks assessed      Posture/Postural Control   Posture/Postural Control  Postural limitations    Postural Limitations  Rounded Shoulders;Forward head      ROM / Strength   AROM / PROM / Strength  AROM      AROM   Overall AROM Comments  No neural tension noted in left UE compared to right UE    AROM Assessment Site  Shoulder    Right/Left Shoulder  Left    Left  Shoulder Extension  58 Degrees    Left Shoulder Flexion  130 Degrees    Left Shoulder ABduction  147 Degrees    Left Shoulder Internal Rotation  77 Degrees    Left Shoulder External Rotation  88 Degrees      Palpation   Palpation comment  Incisions appear to be well healed with some tightness present around axillary incision with scar tissue present. Otherwise healing well.        LYMPHEDEMA/ONCOLOGY QUESTIONNAIRE - 04/23/18 0819      Type   Cancer Type  Left breast cancer      Surgeries   Lumpectomy Date  03/19/18    Sentinel Lymph Node Biopsy Date  03/19/18    Number Lymph Nodes Removed  3      Treatment   Active Chemotherapy Treatment  No    Past Chemotherapy Treatment  No    Active Radiation Treatment  Yes    Date  04/16/18    Body Site  left breast and axilla    Past Radiation Treatment  No    Current Hormone Treatment  No    Past Hormone Therapy  No      What other symptoms do you have   Are you Having Heaviness or Tightness  No    Are you having Pain  Yes    Are you having pitting edema  No    Is it Hard or Difficult finding clothes that fit  No    Do you have infections  No    Is there Decreased scar mobility  No    Stemmer Sign  No      Right Upper Extremity Lymphedema   10 cm Proximal to Olecranon Process  25 cm    Olecranon Process  21.9 cm    10 cm Proximal to Ulnar Styloid Process  18.4 cm    Just Proximal to Ulnar Styloid Process  13.8 cm    Across Hand at [employer]  19.3 cm    At [place] of 2nd Digit  6.3 cm      Left Upper Extremity Lymphedema   10 cm Proximal to Olecranon Process  24.8 cm    Olecranon Process  22.9 cm    10 cm Proximal to Ulnar Styloid Process  18.3 cm    Just Proximal to Ulnar Styloid Process  14.1 cm    Across Hand at [employer]  Space  18.7 cm    At [place] of 2nd Digit  6 cm        Quick Dash - 04/23/18 0001    Open a tight or new jar  No difficulty    Do heavy household chores (wash walls, wash floors)  No difficulty     Carry a shopping bag or briefcase  No difficulty    Wash your back  No difficulty    Use a knife to cut food  No difficulty    Recreational activities in which you take some force or impact through your arm, shoulder, or hand (golf, hammering, tennis)  No difficulty    During the past week, to what extent has your arm, shoulder or hand problem interfered with your normal social activities with family, friends, neighbors, or groups?  Not at all    During the past week, to what extent has your arm, shoulder or hand problem limited your work or other regular daily activities  Not at all    Arm, shoulder, or hand pain.  Mild    Tingling (pins and needles) in your arm, shoulder, or hand  None    Difficulty Sleeping  No difficulty    DASH Score  2.27 %                     PT Education - 04/23/18 0835    Education provided  Yes    Education Details  Educated pt on scar massage and lymphedema risk reduction    Person(s) Educated  Patient    Methods  Explanation;Demonstration    Comprehension  Verbalized understanding          PT Long Term Goals - 04/23/18 0841      PT LONG TERM GOAL #1   Title  Patient will demonstrate she has regained shoulder function and ROM post opreatively back to baseline.    Time  8    Period  Weeks    Status  Achieved      Breast Clinic Goals - 03/11/18 1157      Patient will be able to verbalize understanding of pertinent lymphedema risk reduction practices relevant to her diagnosis specifically related to skin care.   Time  1    Period  Days    Status  Achieved      Patient will be able to return demonstrate and/or verbalize understanding of the post-op home exercise program related to regaining shoulder range of motion.   Time  1    Period  Days    Status  Achieved      Patient will be able to verbalize understanding of the importance of attending the postoperative After Breast Cancer Class for further lymphedema risk reduction  education and therapeutic exercise.   Time  1    Period  Days    Status  Achieved           Plan - 04/23/18 0839    Clinical Impression Statement  Patient doing very well s/p left lumpectomy and SLNB. She is currently undergoing radiation to her left breast and axilla as 1 axillary node was positive. She would benefit from doing self scar massage in the axillary area and beginning a walking program, but she was educated on those things today and feels comfortable pursuing those on her own. No need for PT at this time but will attend the ABC class on 05/04/18    Rehab Potential  Excellent  PT Treatment/Interventions  ADLs/Self Care Home Management;Therapeutic exercise;Patient/family education    PT Next Visit Plan  D/C - goals met    PT Home Exercise Plan  Post op shoulder ROM HEP    Consulted and Agree with Plan of Care  Patient       Patient will benefit from skilled therapeutic intervention in order to improve the following deficits and impairments:  Decreased knowledge of precautions, Impaired UE functional use, Decreased range of motion, Postural dysfunction, Pain  Visit Diagnosis: Malignant neoplasm of lower-inner quadrant of left breast in female, estrogen receptor positive ([facility])  Abnormal posture  Aftercare following surgery for neoplasm     Problem List Patient Active Problem List   Diagnosis Date Noted  . Genetic testing 03/27/2018  . Family history of ovarian cancer   . Family history of lung cancer   . Family history of prostate cancer   . Malignant neoplasm of lower-inner quadrant of left breast in female, estrogen receptor positive ([place]) 03/06/2018  . Osteopenia 02/18/2018  . COPD, moderate ([place]) 11/06/2015  . CAD (coronary artery disease), native coronary artery   . Bronchiectasis with (acute) exacerbation ([place]) 06/09/2015  . History of smoking 25-50 pack years 06/09/2015  . Atherosclerosis of aorta ([place]) 02/02/2015  . Hiatal hernia 02/02/2015  .  Emphysema of lung ([place]) 02/02/2015  . Depressive disorder, not elsewhere classified 12/04/2011  . Vitamin D deficiency 02/14/2011    Annia Friendly, PT 04/23/18 8:42 AM  [place], [place], [address] Phone: [phone]   Fax:  [phone]  Name: Molly Ross MRN: [id number] Date of Birth: [birth year]  PHYSICAL THERAPY DISCHARGE SUMMARY  Visits from Start of Care: 2  Current functional level related to goals / functional outcomes: Goals met   Remaining deficits: Some scar tightness in axilla   Education / Equipment: Lymphedema risk reduction Plan: Patient agrees to discharge.  Patient goals were met. Patient is being discharged due to meeting the stated rehab goals.  ?????     Annia Friendly, [place] 04/23/18 8:43 AM

## 2018-04-24 ENCOUNTER — Ambulatory Visit
Admission: RE | Admit: 2018-04-24 | Discharge: 2018-04-24 | Disposition: A | Discharge status: Home / Self Care | Payer: Medicare Other | Source: Ambulatory Visit | Attending: Radiation Oncology | Admitting: Radiation Oncology

## 2018-04-24 DIAGNOSIS — Z170 Estrogen receptor positive status [ER+]: Secondary | ICD-10-CM | POA: Diagnosis not present

## 2018-04-24 DIAGNOSIS — Z510 Encounter for antineoplastic radiation therapy: Secondary | ICD-10-CM | POA: Diagnosis not present

## 2018-04-24 DIAGNOSIS — C50312 Malignant neoplasm of lower-inner quadrant of left female breast: Secondary | ICD-10-CM | POA: Diagnosis not present

## 2018-04-27 ENCOUNTER — Ambulatory Visit
Admission: RE | Admit: 2018-04-27 | Discharge: 2018-04-27 | Disposition: A | Discharge status: Home / Self Care | Payer: Medicare Other | Source: Ambulatory Visit | Attending: Radiation Oncology | Admitting: Radiation Oncology

## 2018-04-27 DIAGNOSIS — Z510 Encounter for antineoplastic radiation therapy: Secondary | ICD-10-CM | POA: Diagnosis not present

## 2018-04-27 DIAGNOSIS — C50312 Malignant neoplasm of lower-inner quadrant of left female breast: Secondary | ICD-10-CM | POA: Diagnosis not present

## 2018-04-27 DIAGNOSIS — Z170 Estrogen receptor positive status [ER+]: Secondary | ICD-10-CM | POA: Diagnosis not present

## 2018-04-28 ENCOUNTER — Ambulatory Visit
Admission: RE | Admit: 2018-04-28 | Discharge: 2018-04-28 | Disposition: A | Discharge status: Home / Self Care | Payer: Medicare Other | Source: Ambulatory Visit | Attending: Radiation Oncology | Admitting: Radiation Oncology

## 2018-04-28 DIAGNOSIS — Z510 Encounter for antineoplastic radiation therapy: Secondary | ICD-10-CM | POA: Diagnosis not present

## 2018-04-28 DIAGNOSIS — Z170 Estrogen receptor positive status [ER+]: Secondary | ICD-10-CM | POA: Diagnosis not present

## 2018-04-28 DIAGNOSIS — C50312 Malignant neoplasm of lower-inner quadrant of left female breast: Secondary | ICD-10-CM | POA: Diagnosis not present

## 2018-04-29 ENCOUNTER — Ambulatory Visit
Admission: RE | Admit: 2018-04-29 | Discharge: 2018-04-29 | Disposition: A | Discharge status: Home / Self Care | Payer: Medicare Other | Source: Ambulatory Visit | Attending: Radiation Oncology | Admitting: Radiation Oncology

## 2018-04-29 DIAGNOSIS — H26493 Other secondary cataract, bilateral: Secondary | ICD-10-CM | POA: Diagnosis not present

## 2018-04-29 DIAGNOSIS — Z9849 Cataract extraction status, unspecified eye: Secondary | ICD-10-CM | POA: Diagnosis not present

## 2018-04-29 DIAGNOSIS — Z170 Estrogen receptor positive status [ER+]: Secondary | ICD-10-CM | POA: Diagnosis not present

## 2018-04-29 DIAGNOSIS — Z510 Encounter for antineoplastic radiation therapy: Secondary | ICD-10-CM | POA: Diagnosis not present

## 2018-04-29 DIAGNOSIS — C50312 Malignant neoplasm of lower-inner quadrant of left female breast: Secondary | ICD-10-CM | POA: Diagnosis not present

## 2018-04-30 ENCOUNTER — Ambulatory Visit
Admission: RE | Admit: 2018-04-30 | Discharge: 2018-04-30 | Disposition: A | Discharge status: Home / Self Care | Payer: Medicare Other | Source: Ambulatory Visit | Attending: Radiation Oncology | Admitting: Radiation Oncology

## 2018-04-30 DIAGNOSIS — C50312 Malignant neoplasm of lower-inner quadrant of left female breast: Secondary | ICD-10-CM | POA: Diagnosis not present

## 2018-04-30 DIAGNOSIS — Z170 Estrogen receptor positive status [ER+]: Secondary | ICD-10-CM | POA: Diagnosis not present

## 2018-04-30 DIAGNOSIS — Z510 Encounter for antineoplastic radiation therapy: Secondary | ICD-10-CM | POA: Diagnosis not present

## 2018-05-01 ENCOUNTER — Ambulatory Visit
Admission: RE | Admit: 2018-05-01 | Discharge: 2018-05-01 | Disposition: A | Discharge status: Home / Self Care | Payer: Medicare Other | Source: Ambulatory Visit | Attending: Radiation Oncology | Admitting: Radiation Oncology

## 2018-05-01 DIAGNOSIS — C50312 Malignant neoplasm of lower-inner quadrant of left female breast: Secondary | ICD-10-CM | POA: Diagnosis not present

## 2018-05-01 DIAGNOSIS — Z510 Encounter for antineoplastic radiation therapy: Secondary | ICD-10-CM | POA: Diagnosis not present

## 2018-05-01 DIAGNOSIS — Z170 Estrogen receptor positive status [ER+]: Secondary | ICD-10-CM | POA: Diagnosis not present

## 2018-05-04 ENCOUNTER — Ambulatory Visit
Admission: RE | Admit: 2018-05-04 | Discharge: 2018-05-04 | Disposition: A | Discharge status: Home / Self Care | Payer: Medicare Other | Source: Ambulatory Visit | Attending: Radiation Oncology | Admitting: Radiation Oncology

## 2018-05-04 DIAGNOSIS — Z170 Estrogen receptor positive status [ER+]: Secondary | ICD-10-CM | POA: Diagnosis not present

## 2018-05-04 DIAGNOSIS — Z510 Encounter for antineoplastic radiation therapy: Secondary | ICD-10-CM | POA: Diagnosis not present

## 2018-05-04 DIAGNOSIS — C50312 Malignant neoplasm of lower-inner quadrant of left female breast: Secondary | ICD-10-CM | POA: Diagnosis not present

## 2018-05-05 ENCOUNTER — Ambulatory Visit
Admission: RE | Admit: 2018-05-05 | Discharge: 2018-05-05 | Disposition: A | Discharge status: Home / Self Care | Payer: Medicare Other | Source: Ambulatory Visit | Attending: Radiation Oncology | Admitting: Radiation Oncology

## 2018-05-05 DIAGNOSIS — Z510 Encounter for antineoplastic radiation therapy: Secondary | ICD-10-CM | POA: Diagnosis not present

## 2018-05-05 DIAGNOSIS — Z170 Estrogen receptor positive status [ER+]: Secondary | ICD-10-CM | POA: Diagnosis not present

## 2018-05-05 DIAGNOSIS — C50312 Malignant neoplasm of lower-inner quadrant of left female breast: Secondary | ICD-10-CM | POA: Diagnosis not present

## 2018-05-06 ENCOUNTER — Ambulatory Visit
Admission: RE | Admit: 2018-05-06 | Discharge: 2018-05-06 | Disposition: A | Discharge status: Home / Self Care | Payer: Medicare Other | Source: Ambulatory Visit | Attending: Radiation Oncology | Admitting: Radiation Oncology

## 2018-05-06 DIAGNOSIS — Z170 Estrogen receptor positive status [ER+]: Secondary | ICD-10-CM | POA: Diagnosis not present

## 2018-05-06 DIAGNOSIS — C50312 Malignant neoplasm of lower-inner quadrant of left female breast: Secondary | ICD-10-CM | POA: Diagnosis not present

## 2018-05-06 DIAGNOSIS — Z510 Encounter for antineoplastic radiation therapy: Secondary | ICD-10-CM | POA: Diagnosis not present

## 2018-05-07 ENCOUNTER — Ambulatory Visit
Admission: RE | Admit: 2018-05-07 | Discharge: 2018-05-07 | Disposition: A | Discharge status: Home / Self Care | Payer: Medicare Other | Source: Ambulatory Visit | Attending: Radiation Oncology | Admitting: Radiation Oncology

## 2018-05-07 DIAGNOSIS — Z510 Encounter for antineoplastic radiation therapy: Secondary | ICD-10-CM | POA: Diagnosis not present

## 2018-05-07 DIAGNOSIS — Z170 Estrogen receptor positive status [ER+]: Secondary | ICD-10-CM | POA: Diagnosis not present

## 2018-05-07 DIAGNOSIS — C50312 Malignant neoplasm of lower-inner quadrant of left female breast: Secondary | ICD-10-CM | POA: Diagnosis not present

## 2018-05-08 ENCOUNTER — Ambulatory Visit
Admission: RE | Admit: 2018-05-08 | Discharge: 2018-05-08 | Disposition: A | Discharge status: Home / Self Care | Payer: Medicare Other | Source: Ambulatory Visit | Attending: Radiation Oncology | Admitting: Radiation Oncology

## 2018-05-08 DIAGNOSIS — C50312 Malignant neoplasm of lower-inner quadrant of left female breast: Secondary | ICD-10-CM | POA: Diagnosis not present

## 2018-05-08 DIAGNOSIS — Z170 Estrogen receptor positive status [ER+]: Secondary | ICD-10-CM | POA: Diagnosis not present

## 2018-05-08 DIAGNOSIS — Z510 Encounter for antineoplastic radiation therapy: Secondary | ICD-10-CM | POA: Diagnosis not present

## 2018-05-12 ENCOUNTER — Ambulatory Visit
Admission: RE | Admit: 2018-05-12 | Discharge: 2018-05-12 | Disposition: A | Discharge status: Home / Self Care | Payer: Medicare Other | Source: Ambulatory Visit | Attending: Radiation Oncology | Admitting: Radiation Oncology

## 2018-05-12 DIAGNOSIS — C50312 Malignant neoplasm of lower-inner quadrant of left female breast: Secondary | ICD-10-CM

## 2018-05-12 DIAGNOSIS — Z170 Estrogen receptor positive status [ER+]: Principal | ICD-10-CM

## 2018-05-12 DIAGNOSIS — Z510 Encounter for antineoplastic radiation therapy: Secondary | ICD-10-CM | POA: Diagnosis not present

## 2018-05-12 MED ORDER — RADIAPLEXRX EX GEL
Freq: Once | CUTANEOUS | Status: AC
  Administered 2018-05-12: 10:00:00 via TOPICAL

## 2018-05-13 ENCOUNTER — Ambulatory Visit
Admission: RE | Admit: 2018-05-13 | Discharge: 2018-05-13 | Disposition: A | Discharge status: Home / Self Care | Payer: Medicare Other | Source: Ambulatory Visit | Attending: Radiation Oncology | Admitting: Radiation Oncology

## 2018-05-13 DIAGNOSIS — Z170 Estrogen receptor positive status [ER+]: Secondary | ICD-10-CM | POA: Diagnosis not present

## 2018-05-13 DIAGNOSIS — C50312 Malignant neoplasm of lower-inner quadrant of left female breast: Secondary | ICD-10-CM | POA: Diagnosis not present

## 2018-05-13 DIAGNOSIS — Z510 Encounter for antineoplastic radiation therapy: Secondary | ICD-10-CM | POA: Diagnosis not present

## 2018-05-14 ENCOUNTER — Encounter: Payer: Self-pay | Admitting: Radiation Oncology

## 2018-05-14 ENCOUNTER — Ambulatory Visit
Admission: RE | Admit: 2018-05-14 | Discharge: 2018-05-14 | Disposition: A | Discharge status: Home / Self Care | Payer: Medicare Other | Source: Ambulatory Visit | Attending: Radiation Oncology | Admitting: Radiation Oncology

## 2018-05-14 ENCOUNTER — Telehealth: Payer: Self-pay | Admitting: Hematology and Oncology

## 2018-05-14 ENCOUNTER — Inpatient Hospital Stay: Payer: Medicare Other | Attending: Hematology and Oncology | Admitting: Hematology and Oncology

## 2018-05-14 DIAGNOSIS — C50312 Malignant neoplasm of lower-inner quadrant of left female breast: Secondary | ICD-10-CM

## 2018-05-14 DIAGNOSIS — Z170 Estrogen receptor positive status [ER+]: Secondary | ICD-10-CM | POA: Diagnosis not present

## 2018-05-14 DIAGNOSIS — Z510 Encounter for antineoplastic radiation therapy: Secondary | ICD-10-CM | POA: Diagnosis not present

## 2018-05-14 MED ORDER — LETROZOLE 2.5 MG PO TABS: 2.5000 mg | ORAL_TABLET | Freq: Every day | ORAL | 3 refills | Status: DC

## 2018-05-14 NOTE — Telephone Encounter (Signed)
Gave patient AVs and calendar of upcoming august appointments.  °

## 2018-05-14 NOTE — Progress Notes (Signed)
Patient Care Team: Rita Ohara, MD as PCP - General (Family Medicine) Stark Klein, MD as Consulting Physician (General Surgery) Nicholas Lose, MD as Consulting Physician (Hematology and Oncology) Eppie Gibson, MD as Attending Physician (Radiation Oncology)  DIAGNOSIS:  Encounter Diagnosis  Name Primary?  . Malignant neoplasm of lower-inner quadrant of left breast in female, estrogen receptor positive ([place])     SUMMARY OF ONCOLOGIC HISTORY:   Malignant neoplasm of lower-inner quadrant of left breast in female, estrogen receptor positive ([place])   03/06/2018 Initial Diagnosis    Screening detected left breast mass at 8 o'clock position 1.2 cm in size lower inner quadrant axilla is negative biopsy revealed grade 2 invasive ductal carcinoma ER 100%, PR 90%, Ki-67 5%, HER-2 negative, T1 be N0 stage I a clinical stage      03/19/2018 Surgery    Left Lumpectomy: IDC Grade 1, 1.3 cm, 1/3 LN Positive, with LVI, T1cN1 ER 100%, PR 90%, Ki-67 5%, HER-2 negative Stage 1B      03/21/2018 Genetic Testing    The Common Hereditary Cancer Panel offered by [facility] includes sequencing and/or deletion duplication testing of the following 47 genes: APC, ATM, AXIN2, BARD1, BMPR1A, BRCA1, BRCA2, BRIP1, CDH1, CDKN2A (p14ARF), CDKN2A (p16INK4a), CKD4, CHEK2, CTNNA1, DICER1, EPCAM (Deletion/duplication testing only), GREM1 (promoter region deletion/duplication testing only), KIT, MEN1, MLH1, MSH2, MSH3, MSH6, MUTYH, NBN, NF1, NHTL1, PALB2, PDGFRA, PMS2, POLD1, POLE, PTEN, RAD50, RAD51C, RAD51D, SDHB, SDHC, SDHD, SMAD4, SMARCA4. STK11, TP53, TSC1, TSC2, and VHL.  The following genes were evaluated for sequence changes only: SDHA and HOXB13 c.251G>A variant only.  Results: No pathogenic variants identified.   A variant of uncertain significance in the gene SMARCA4 was identified c.5011C>T (V.[id number]).  The date of this test report is 03/21/2018      04/10/2018 Cancer Staging    Staging form: Breast, AJCC 8th  Edition - Pathologic: Stage IA (pT1c, pN1a, cM0, G1, ER+, PR+, HER2-) - Signed by Eppie Gibson, MD on 04/10/2018      04/16/2018 - 05/14/2018 Radiation Therapy    Adjuvant radiation therapy       CHIEF COMPLIANT: Follow-up at the end of radiation  INTERVAL HISTORY: Molly Ross is a 65-year with above-mentioned history of left breast cancer who just completed adjuvant radiation therapy.  She is here today to discuss adjuvant antiestrogen therapy options.  She has radiation dermatitis but otherwise tolerated radiation overall fairly well.  REVIEW OF SYSTEMS:   Constitutional: Denies fevers, chills or abnormal weight loss Eyes: Denies blurriness of vision Ears, nose, mouth, throat, and face: Denies mucositis or sore throat Respiratory: Denies cough, dyspnea or wheezes Cardiovascular: Denies palpitation, chest discomfort Gastrointestinal:  Denies nausea, heartburn or change in bowel habits Skin: Denies abnormal skin rashes Lymphatics: Denies new lymphadenopathy or easy bruising Neurological:Denies numbness, tingling or new weaknesses Behavioral/Psych: Mood is stable, no new changes  Extremities: No lower extremity edema Breast: Radiation dermatitis All other systems were reviewed with the patient and are negative.  I have reviewed the past medical history, past surgical history, social history and family history with the patient and they are unchanged from previous note.  ALLERGIES:  has No Known Allergies.  MEDICATIONS:  Current Outpatient Medications  Medication Sig Dispense Refill  . Ascorbic Acid (VITAMIN C) 100 MG tablet Take 1 tablet (100 mg total) by mouth daily.    Marland Kitchen aspirin EC 81 MG tablet Take 81 mg by mouth daily.    Marland Kitchen atorvastatin (LIPITOR) 20 MG tablet Take 1 tablet (20  mg total) by mouth daily. 90 tablet 1  . Calcium-Magnesium-Vitamin D (CALCIUM 500 PO) Take 1 tablet by mouth daily.    . fish oil-omega-3 fatty acids 1000 MG capsule Take 3 g by mouth daily.     .  fluticasone furoate-vilanterol (BREO ELLIPTA) 100-25 MCG/INH AEPB Inhale 1 puff into the lungs daily. 1 each 5  . letrozole (FEMARA) 2.5 MG tablet Take 1 tablet (2.5 mg total) by mouth daily. 90 tablet 3  . Multiple Vitamins-Minerals (MULTIVITAMIN WITH MINERALS) tablet Take 1 tablet by mouth daily.     No current facility-administered medications for this visit.     PHYSICAL EXAMINATION: ECOG PERFORMANCE STATUS: 1 - Symptomatic but completely ambulatory  Vitals:   05/14/18 0847  BP: 123/66  Pulse: 72  Resp: 18  Temp: 97.8 F (36.6 C)  SpO2: 97%   Filed Weights   05/14/18 0847  Weight: 143 lb 4.8 oz (65 kg)    GENERAL:alert, no distress and comfortable SKIN: skin color, texture, turgor are normal, no rashes or significant lesions EYES: normal, Conjunctiva are pink and non-injected, sclera clear OROPHARYNX:no exudate, no erythema and lips, buccal mucosa, and tongue normal  NECK: supple, thyroid normal size, non-tender, without nodularity LYMPH:  no palpable lymphadenopathy in the cervical, axillary or inguinal LUNGS: clear to auscultation and percussion with normal breathing effort HEART: regular rate & rhythm and no murmurs and no lower extremity edema ABDOMEN:abdomen soft, non-tender and normal bowel sounds MUSCULOSKELETAL:no cyanosis of digits and no clubbing  NEURO: alert & oriented x 3 with fluent speech, no focal motor/sensory deficits EXTREMITIES: No lower extremity edema  LABORATORY DATA:  I have reviewed the data as listed CMP Latest Ref Rng & Units 03/11/2018 01/05/2018 09/01/2017  Glucose 70 - 140 mg/dL 91 83 87  BUN 7 - 26 mg/dL 21 - 12  Creatinine 0.60 - 1.10 mg/dL 0.69 - 0.56(L)  Sodium 136 - 145 mmol/L 141 - 142  Potassium 3.5 - 5.1 mmol/L 4.2 - 4.9  Chloride 98 - 109 mmol/L 104 - 104  CO2 22 - 29 mmol/L 28 - 30  Calcium 8.4 - 10.4 mg/dL 10.4 - 9.8  Total Protein 6.4 - 8.3 g/dL 7.7 - 7.1  Total Bilirubin 0.2 - 1.2 mg/dL 1.0 - 0.9  Alkaline Phos 40 - 150 U/L  96 - -  AST 5 - 34 U/L 18 - 18  ALT 0 - 55 U/L 11 - 10    Lab Results  Component Value Date   WBC 8.1 03/11/2018   HGB 12.8 03/11/2018   HCT 39.9 03/11/2018   MCV 91.7 03/11/2018   PLT 311 03/11/2018   NEUTROABS 5.3 03/11/2018    ASSESSMENT & PLAN:  Malignant neoplasm of lower-inner quadrant of left breast in female, estrogen receptor positive ([place]) 03/19/18: Left Lumpectomy: IDC Grade 1, 1.3 cm, 1/3 LN Positive, with LVI, T1cN1 ER 100%, PR 90%, Ki-67 5%, HER-2 negative Stage 1B Adjuvant radiation therapy 04/16/2018 to 05/14/2018  Treatment plan: letrozole 2.5 mg daily times 5-7 years Letrozole counseling:We discussed the risks and benefits of anti-estrogen therapy with aromatase inhibitors. These include but not limited to insomnia, hot flashes, mood changes, vaginal dryness, bone density loss, and weight gain. We strongly believe that the benefits far outweigh the risks. Patient understands these risks and consented to starting treatment. Planned treatment duration is 5-7 years.  Return to clinic in 3 months for survivorship care plan visit   No orders of the defined types were placed in this encounter.  The patient has a good understanding of the overall plan. she agrees with it. she will call with any problems that may develop before the next visit here.   Harriette Ohara, MD 05/14/18

## 2018-05-14 NOTE — Assessment & Plan Note (Signed)
03/19/18: Left Lumpectomy: IDC Grade 1, 1.3 cm, 1/3 LN Positive, with LVI, T1cN1 ER 100%, PR 90%, Ki-67 5%, HER-2 negative Stage 1B Adjuvant radiation therapy 04/16/2018 to 05/14/2018  Treatment plan: letrozole 2.5 mg daily times 5-7 years Letrozole counseling:We discussed the risks and benefits of anti-estrogen therapy with aromatase inhibitors. These include but not limited to insomnia, hot flashes, mood changes, vaginal dryness, bone density loss, and weight gain. We strongly believe that the benefits far outweigh the risks. Patient understands these risks and consented to starting treatment. Planned treatment duration is 5-7 years.  Return to clinic in 3 months for survivorship care plan visit

## 2018-05-15 NOTE — Progress Notes (Signed)
  Radiation Oncology         [phone] ________________________________  Name: Molly Ross MRN: [id number]  Date: 05/14/2018  DOB: May 08, 1941  End of Treatment Note  Diagnosis:   Malignant neoplasm of lower-inner quadrant of left breast in female, estrogen receptor positive ([place]). Left Lumpectomy: IDC Grade 1, 1.3 cm, 1/3 LN Positive, with LVI, pT1c N1 cM0 ER 100%, PR 90%, Ki-67 5%, HER-2 negative   Cancer Staging Malignant neoplasm of lower-inner quadrant of left breast in female, estrogen receptor positive ([place]) Staging form: Breast, AJCC 8th Edition - Clinical stage from 03/11/2018: Stage IA (cT1c, cN0, cM0, G2, ER+, PR+, HER2-) - Unsigned - Pathologic: Stage IA (pT1c, pN1a, cM0, G1, ER+, PR+, HER2-) - Signed by Eppie Gibson, MD on 04/10/2018   Indication for treatment:  Curative       Radiation treatment dates:   04/16/18 - 05/14/18  Site/dose:   42.56 Gy directed to the Left Breast and axilla in 16 fractions followed by a boost of 8 Gy in 4 fractions.  Beams/energy: 3D high tangents // 10X, 6X  Photon boost / 10 and 6X  Narrative: [name] tolerated radiation treatment relatively well. She reported having faitgue and denied having pain. She has some soreness on her left breast. Her left breast also has some redness. She is using Radiaplex to help with the redness.  Plan: The patient has completed radiation treatment. The patient will return to radiation oncology clinic for routine followup in one month. I advised them to call or return sooner if they have any questions or concerns related to their recovery or treatment.  -----------------------------------  Eppie Gibson, MD  This document serves as a record of services personally performed by Eppie Gibson MD. It was created on her behalf by Delton Coombes, a trained medical scribe. The creation of this record is based on the scribe's personal observations and the provider's statements to them.

## 2018-06-10 ENCOUNTER — Encounter: Payer: Self-pay | Admitting: Radiation Oncology

## 2018-06-10 NOTE — Progress Notes (Signed)
Molly Ross presents for follow up of radiation completed 05/14/18 to her Left Breast. She reports soreness to her left breast and a "hard" area at her incision site. She has mild hyperpigmentation present. She had stopped using her radiaplex but will now complete the tube she has at home. She will then switch to a vitamin E containing lotion after it is completed. She saw Dr. Lindi Adie on 05/14/18 and began Letrozole 2.5 mg daily. She has an appointment with Survivorship on 08/11/18.   BP (!) 123/55   Pulse 74   Temp 98 F (36.7 C)   Resp 18   Ht 5\' 9"  (1.753 m)   Wt 144 lb (65.3 kg)   SpO2 98% Comment: room air  BMI 21.27 kg/m    Wt Readings from Last 3 Encounters:  06/16/18 144 lb (65.3 kg)  05/14/18 143 lb 4.8 oz (65 kg)  04/10/18 145 lb 12.8 oz (66.1 kg)

## 2018-06-16 ENCOUNTER — Encounter: Payer: Self-pay | Admitting: Radiation Oncology

## 2018-06-16 ENCOUNTER — Other Ambulatory Visit: Payer: Self-pay

## 2018-06-16 ENCOUNTER — Ambulatory Visit
Admission: RE | Admit: 2018-06-16 | Discharge: 2018-06-16 | Disposition: A | Discharge status: Home / Self Care | Payer: Medicare Other | Source: Ambulatory Visit | Attending: Radiation Oncology | Admitting: Radiation Oncology

## 2018-06-16 VITALS — BP 123/55 | HR 74 | Temp 98.0°F | Resp 18 | Ht 69.0 in | Wt 144.0 lb

## 2018-06-16 DIAGNOSIS — C50312 Malignant neoplasm of lower-inner quadrant of left female breast: Secondary | ICD-10-CM | POA: Diagnosis not present

## 2018-06-16 DIAGNOSIS — Z79899 Other long term (current) drug therapy: Secondary | ICD-10-CM | POA: Insufficient documentation

## 2018-06-16 DIAGNOSIS — Z8041 Family history of malignant neoplasm of ovary: Secondary | ICD-10-CM | POA: Insufficient documentation

## 2018-06-16 DIAGNOSIS — Z7982 Long term (current) use of aspirin: Secondary | ICD-10-CM | POA: Insufficient documentation

## 2018-06-16 DIAGNOSIS — Z08 Encounter for follow-up examination after completed treatment for malignant neoplasm: Secondary | ICD-10-CM | POA: Diagnosis not present

## 2018-06-16 DIAGNOSIS — Z79811 Long term (current) use of aromatase inhibitors: Secondary | ICD-10-CM | POA: Diagnosis not present

## 2018-06-16 DIAGNOSIS — Z170 Estrogen receptor positive status [ER+]: Secondary | ICD-10-CM | POA: Diagnosis not present

## 2018-06-16 HISTORY — DX: Personal history of irradiation: Z92.3

## 2018-06-16 NOTE — Progress Notes (Signed)
Radiation Oncology         [phone] ________________________________  Name: Molly Ross MRN: [id number]  Date: 06/16/2018  DOB: 1941-04-23  Follow-Up Visit Note  Outpatient  CC: Rita Ohara, MD  Nicholas Lose, MD  Diagnosis and Prior Radiotherapy:    ICD-10-CM   1. Malignant neoplasm of lower-inner quadrant of left breast in female, estrogen receptor positive ([place]) C50.312    Z17.0      04/16/2018 - 05/14/2018 to Left Breast  CHIEF COMPLAINT: Here for follow-up and surveillance of left breast cancer  Narrative:  The patient returns today for routine follow-up. She is doing well overall, however, she is a little tearful at this time as her sister recently passed away from ovarian cancer. She declines a Education officer, museum for assistance at this time. She saw Dr. Lindi Adie on 05/14/18 and was instructed to begin Letrozole 2.5 mg daily. She has an appointment with Survivorship on 08/11/18.                             ALLERGIES:  has No Known Allergies.  Meds: Current Outpatient Medications  Medication Sig Dispense Refill  . Ascorbic Acid (VITAMIN C) 100 MG tablet Take 1 tablet (100 mg total) by mouth daily.    Marland Kitchen aspirin EC 81 MG tablet Take 81 mg by mouth daily.    Marland Kitchen atorvastatin (LIPITOR) 20 MG tablet Take 1 tablet (20 mg total) by mouth daily. 90 tablet 1  . Calcium-Magnesium-Vitamin D (CALCIUM 500 PO) Take 1 tablet by mouth daily.    . fish oil-omega-3 fatty acids 1000 MG capsule Take 3 g by mouth daily.     . fluticasone furoate-vilanterol (BREO ELLIPTA) 100-25 MCG/INH AEPB Inhale 1 puff into the lungs daily. 1 each 5  . letrozole (FEMARA) 2.5 MG tablet Take 1 tablet (2.5 mg total) by mouth daily. 90 tablet 3  . Multiple Vitamins-Minerals (MULTIVITAMIN WITH MINERALS) tablet Take 1 tablet by mouth daily.     No current facility-administered medications for this encounter.     Physical Findings: The patient is in no acute distress. Patient is alert and oriented.  height is 5\' 9"   (1.753 m) and weight is 144 lb (65.3 kg). Her temperature is 98 F (36.7 C). Her blood pressure is 123/55 (abnormal) and her pulse is 74. Her respiration is 18 and oxygen saturation is 98%. .    Satisfactory skin healing in radiotherapy fields. Left breast healing well with minimal hyperpigmentation.    Lab Findings: Lab Results  Component Value Date   WBC 8.1 03/11/2018   HGB 12.8 03/11/2018   HCT 39.9 03/11/2018   MCV 91.7 03/11/2018   PLT 311 03/11/2018    Radiographic Findings: No results found.  Impression/Plan: Healing well from radiotherapy to the breast tissue.  Continue skin care with topical Vitamin E Oil and / or lotion for at least 2 more months for further healing.  I encouraged her to continue with yearly mammography as appropriate (for intact breast tissue) and followup with medical oncology. I will see her back on an as-needed basis. I have encouraged her to call if she has any issues or concerns in the future. I wished her the very best.   I spent 15 minutes face to face with the patient and more than 50% of that time was spent in counseling and/or coordination of care. _____________________________________   Eppie Gibson, MD  This document serves as a record of  services personally performed by Eppie Gibson, MD. It was created on her behalf by Margit Banda, a trained medical scribe. The creation of this record is based on the scribe's personal observations and the provider's statements to them. This document has been checked and approved by the attending provider.

## 2018-07-11 ENCOUNTER — Encounter: Payer: Self-pay | Admitting: Pulmonary Disease

## 2018-07-11 ENCOUNTER — Telehealth: Payer: Self-pay | Admitting: Pulmonary Disease

## 2018-07-11 DIAGNOSIS — J471 Bronchiectasis with (acute) exacerbation: Secondary | ICD-10-CM

## 2018-07-11 DIAGNOSIS — J449 Chronic obstructive pulmonary disease, unspecified: Secondary | ICD-10-CM

## 2018-07-11 MED ORDER — PREDNISONE 10 MG PO TABS: 20.0000 mg | ORAL_TABLET | Freq: Two times a day (BID) | ORAL | 0 refills | Status: AC

## 2018-07-11 MED ORDER — LEVOFLOXACIN 750 MG PO TABS: 750.0000 mg | ORAL_TABLET | Freq: Every day | ORAL | 0 refills | Status: AC

## 2018-07-11 NOTE — Telephone Encounter (Signed)
Patient had called the answering service for feeling tightness in her chest and worsening shortness of breath  Last exacerbation was about March  Recent treatment for breast cancer  Cough with shortness of breath, chest tightness  She was reassured Called in antibiotics and steroids-Levaquin 750 p.o. daily for 5 days and prednisone 20 p.o. twice daily for 5 days

## 2018-07-22 DIAGNOSIS — C50312 Malignant neoplasm of lower-inner quadrant of left female breast: Secondary | ICD-10-CM | POA: Diagnosis not present

## 2018-07-22 DIAGNOSIS — Z170 Estrogen receptor positive status [ER+]: Secondary | ICD-10-CM | POA: Diagnosis not present

## 2018-07-27 ENCOUNTER — Ambulatory Visit (INDEPENDENT_AMBULATORY_CARE_PROVIDER_SITE_OTHER)
Admission: RE | Admit: 2018-07-27 | Discharge: 2018-07-27 | Disposition: A | Discharge status: Home / Self Care | Payer: Medicare Other | Source: Ambulatory Visit | Attending: Pulmonary Disease | Admitting: Pulmonary Disease

## 2018-07-27 ENCOUNTER — Encounter: Payer: Self-pay | Admitting: Pulmonary Disease

## 2018-07-27 ENCOUNTER — Ambulatory Visit: Payer: Medicare Other | Admitting: Pulmonary Disease

## 2018-07-27 VITALS — BP 138/78 | HR 93 | Ht 69.0 in | Wt 145.0 lb

## 2018-07-27 DIAGNOSIS — R05 Cough: Secondary | ICD-10-CM | POA: Diagnosis not present

## 2018-07-27 DIAGNOSIS — J471 Bronchiectasis with (acute) exacerbation: Secondary | ICD-10-CM | POA: Diagnosis not present

## 2018-07-27 MED ORDER — LEVOFLOXACIN 750 MG PO TABS: 750.0000 mg | ORAL_TABLET | ORAL | 0 refills | Status: DC

## 2018-07-27 NOTE — Progress Notes (Signed)
[facility]  Chief Complaint  Patient presents with  . Follow-up    patient is complaining of chest tightness and coughing    Constitutional: BP 138/78 (BP Location: Left Arm, Cuff Size: Normal)   Pulse 93   Ht 5\' 9"  (1.753 m)   Wt 145 lb (65.8 kg)   SpO2 94%   BMI 21.41 kg/m   History of Present Illness: Molly Ross is a 77 y.o. female former smoker with COPD and bronchiectasis.  She is followed by Dr. Chase Caller.  Developed more cough and right sided chest pain about 2 days ago.  Called office end of July and was tx with prednisone and levaquin.  Helped but symptoms returned.  Felt feverish yesterday.  More short of breath.  Has some sinus congestion and scratchy throat.  Bringing up green sputum.  Not having gland swelling, hemoptysis, skin rash, abdominal pain, or leg swelling.  No sick or environmental exposures.  Uses breo.  Hasn't been using albuterol, mucinex, or flutter valve much.   Comprehensive Respiratory Exam:  Appearance - well kempt  ENMT - nasal mucosa moist, turbinates clear, midline nasal septum, no dental lesions, no gingival bleeding, no oral exudates, no tonsillar hypertrophy Neck - no masses, trachea midline, no thyromegaly, no elevation in JVP Respiratory - coarse breaths sounds b/l, normal respiratory excursion, faint b/l crackles Rt > Lt, no dullness to percussion CV - s1s2 regular rate and rhythm, no murmurs, no peripheral edema, radial pulses symmetric GI - soft, non tender, no masses Lymph - no adenopathy noted in neck and axillary areas MSK - normal muscle strength and tone, normal gait Ext - no cyanosis, clubbing, or joint inflammation noted Skin - no rashes, lesions, or ulcers Neuro - oriented to person, place, and time Psych - normal mood and affect   Assessment/Plan:  COPD with bronchiectasis with an acute exacerbation. - chest xray today - will arrange for sample of sputum for culture and  AFB - will give additional course of levaquin - don't think she needs additional prednisone - continue breo - advised her she can try using albuterol, mucinex, and flutter valve on more regular basis when she has more symptoms   Patient Instructions  Will arrange for samples of your sputum for gram stain and culture, and for AFB  Chest xray today  Levaquin 750 mg every other day for 10 days  Follow up in 1 month with Dr. Chase Caller or Nurse Practitioner    Chesley Mires, MD [place] 07/27/2018, 12:51 PM  Flow Sheet  Pulmonary tests: PFT 10/30/15 >> FEV1 1.36 (51%), FEV1% 60, TLC 5.24 (90%), DLCO 51% Serology 02/12/16 >> anti CCP, RF, Aspergillus panel, ANA, anti DS DNA, Scl 70, SSA/SSB, ENA, SM all negative A1AT 02/12/16 >> 170 MM IgE 02/12/16 >> 6 CT chest 04/15/16 >> apical scarring, diffuse bronchiectasis, 8 mm nodule LLL (reviewed by me)  Past Medical History: She  has a past medical history of Bronchiectasis ([place]), Cancer ([place]), Colon polyp, Dyspnea, Family history of lung cancer, Family history of ovarian cancer, Family history of prostate cancer, GERD (gastroesophageal reflux disease), History of radiation therapy (04/16/18- 05/14/18), Shingles (1/09 and 07/2010), and Unspecified vitamin D deficiency (02/2011 and 2016).  Past Surgical History: She  has a past surgical history that includes Shoulder surgery (03/2011); Tonsillectomy (child); Video bronchoscopy (Bilateral, 06/21/2016); Cataract extraction, bilateral (Bilateral, 03/2017, 04/2017); and Breast lumpectomy with radioactive seed and sentinel lymph node biopsy (Left, 03/19/2018).  Family History:  Her family history includes Cancer in her sister; Diabetes in her mother and sister; Heart disease in her maternal grandmother; Lung cancer (age of onset: 66) in her father; Ovarian cancer (age of onset: 19) in her sister; Ovarian cancer (age of onset: 52) in her maternal aunt; Prostate cancer in her cousin; Seizures in  her sister; Stroke in her mother; Stroke (age of onset: 28) in her sister.  Social History: She  reports that she quit smoking about 38 years ago. Her smoking use included cigarettes. She has a 48.00 pack-year smoking history. She has never used smokeless tobacco. She reports that she drinks alcohol. She reports that she does not use drugs.  Medications: Allergies as of 07/27/2018   No Known Allergies     Medication List        Accurate as of 07/27/18 12:51 PM. Always use your most recent med list.          aspirin EC 81 MG tablet Take 81 mg by mouth daily.   atorvastatin 20 MG tablet Commonly known as:  LIPITOR Take 1 tablet (20 mg total) by mouth daily.   CALCIUM 500 PO Take 1 tablet by mouth daily.   fish oil-omega-3 fatty acids 1000 MG capsule Take 3 g by mouth daily.   fluticasone furoate-vilanterol 100-25 MCG/INH Aepb Commonly known as:  BREO ELLIPTA Inhale 1 puff into the lungs daily.   letrozole 2.5 MG tablet Commonly known as:  FEMARA Take 1 tablet (2.5 mg total) by mouth daily.   levofloxacin 750 MG tablet Commonly known as:  LEVAQUIN Take 1 tablet (750 mg total) by mouth every other day.   multivitamin with minerals tablet Take 1 tablet by mouth daily.   vitamin C 100 MG tablet Take 1 tablet (100 mg total) by mouth daily.

## 2018-07-27 NOTE — Patient Instructions (Signed)
Will arrange for samples of your sputum for gram stain and culture, and for AFB  Chest xray today  Levaquin 750 mg every other day for 10 days  Follow up in 1 month with Dr. Chase Caller or Nurse Practitioner

## 2018-07-29 ENCOUNTER — Telehealth: Payer: Self-pay | Admitting: Pulmonary Disease

## 2018-07-29 NOTE — Telephone Encounter (Signed)
ATC pt, no answer. Left message for pt to call back.  

## 2018-07-29 NOTE — Telephone Encounter (Signed)
ATC, busy signal, tried twice no VM. X1

## 2018-07-29 NOTE — Telephone Encounter (Signed)
Pt returning call to get cxr results she can be reached @ [phone].Molly Ross

## 2018-07-29 NOTE — Telephone Encounter (Signed)
Pt is calling back [phone]

## 2018-07-29 NOTE — Telephone Encounter (Signed)
Dg Chest 2 View  Result Date: 07/27/2018 CLINICAL DATA:  Cough.  Known bronchiectasis. EXAM: CHEST - 2 VIEW COMPARISON:  January 17, 2018 FINDINGS: The heart size and mediastinal contours are within normal limits. Chronic bronchiectatic changes of bilateral lungs are unchanged compared prior exam. No focal pneumonia, pulmonary edema or pleural effusion is noted. Surgical clips are projected over the left mid and lower chest. The visualized skeletal structures are stable. IMPRESSION: Chronic bronchiectatic changes of bilateral lungs are stable compared prior exam. No focal pneumonia. Electronically Signed   By: Abelardo Diesel M.D.   On: 07/27/2018 14:08     Please let her know that CXR showed chronic changes from know history of bronchiectasis.  No signs of pneumonia.  No change to current tx plan.

## 2018-07-29 NOTE — Telephone Encounter (Signed)
Pt is returning call. Cb is [phone].

## 2018-07-30 NOTE — Telephone Encounter (Signed)
Patient returned phone call; pt contact # [phone]

## 2018-07-30 NOTE — Telephone Encounter (Signed)
Called and spoke with patient regarding results.  Informed the patient of results and recommendations today. Pt verbalized understanding and denied any questions or concerns at this time.  Nothing further needed.  

## 2018-08-05 ENCOUNTER — Telehealth: Payer: Self-pay

## 2018-08-05 NOTE — Telephone Encounter (Signed)
LVM for pt reminding of SCP visit with NP on 8/27 at 2 pm and left center number if pt has questions.

## 2018-08-11 ENCOUNTER — Inpatient Hospital Stay: Payer: Medicare Other | Attending: Hematology and Oncology | Admitting: Adult Health

## 2018-08-12 ENCOUNTER — Encounter: Payer: Self-pay | Admitting: Adult Health

## 2018-08-17 NOTE — Progress Notes (Signed)
Chief Complaint  Patient presents with  . Hyperlipidemia    fasting med check. States that she is just so tired, wonders if her B12 needs to be checked.    Reporting feeling fatigued for the last several weeks. She thinks it could be related to her lungs and the heat. Hasn't been exercising at all.  Denies numbness/tingling, memory concerns or other B12 symptoms. No changes to hair/skin/bowels/weight. Admits to not probably drinking enough water, urine is very yellow.  No dysuria, odor.  Denies depression.  Feels overwhelmed sometimes in caring for her sister [name] (has help weekdays, not on weekends; sometimes her other sister "peanut" helps).  Harder when she isn't feeling good.  Emphysema and bronchiectasis: Under the care of Dr. Halford Chessman.  She had flare up in the end of July, and mid-August, treated with levaquin (and prednisone in July, but not on most recent visit). She hadn't been using albuterol or flutter valve prior. "It comes and goes, I can't over-do it".  She has been using the flutter valve regularly now, at least once daily. She continues on [place].  Doesn't need albuterol. Mucus is clear in the morning, but can be discolored later in the day after a big coughing spell, unchanged.  Left breast cancer.  She is s/p lumpectomy and completed radiation. She is under the care of Dr. Lindi Adie. She last saw him the end of May; letrozole 2.5 mg daily was started, recommended to use for 5-7 years. She canceled her appointment with the survivorship clinic last week.  Vitamin D deficiency: s/p 12 weeks of rx replacement in February 2016. Last vitamin D level was 52 in 12/2017, and she continues to take MVI and Ca+D (plain Ca in the morning, Ca+D in the evening,her Vit D is 1000 IU. She continues on the same supplement regimen.  Osteopenia:  T-1.8 at R femoral neck on DEXA 02/2018.  She hasn't been getting weight-bearing exercise.  CAD: She was noted to have coronary calcification in 2 vessels on CT  scan in 2016.She was referred to Dr. Wynonia Lawman and had a myocardial perfusion scan. She exercised 5 minutes 30 seconds into Bruce stage II with no EKG changes. She had no evidence of ischemia with an ejection fraction of 76%. She has not had any recurrent chest pain since then. She has been compliant with taking aspirin and statin, and denies side effects.  She is doing well on 45m atorvastatin (had some issues with discomfort in leg on the initial 489mdose).   Denies any exertional symptoms. Doesn't follow regularly with Dr. TiWynonia LawmanShe follows a lowfat, low cholesterol diet. She is fasting for lipids today. Lab Results  Component Value Date   CHOL 158 09/01/2017   HDL 95 09/01/2017   LDLCALC 51 09/01/2017   TRIG 42 09/01/2017   CHOLHDL 1.7 09/01/2017   PMH, PSH, SH reviewed  Outpatient Encounter Medications as of 08/19/2018  Medication Sig  . Ascorbic Acid (VITAMIN C) 100 MG tablet Take 1 tablet (100 mg total) by mouth daily.  . Marland Kitchenspirin EC 81 MG tablet Take 81 mg by mouth daily.  . Marland Kitchentorvastatin (LIPITOR) 20 MG tablet Take 1 tablet (20 mg total) by mouth daily.  . Calcium-Magnesium-Vitamin D (CALCIUM 500 PO) Take 1 tablet by mouth daily.  . fish oil-omega-3 fatty acids 1000 MG capsule Take 3 g by mouth daily.   . fluticasone furoate-vilanterol (BREO ELLIPTA) 100-25 MCG/INH AEPB Inhale 1 puff into the lungs daily.  . Marland Kitchenetrozole (FEMARA) 2.5 MG tablet Take  1 tablet (2.5 mg total) by mouth daily.  . Multiple Vitamins-Minerals (MULTIVITAMIN WITH MINERALS) tablet Take 1 tablet by mouth daily.  . [DISCONTINUED] levofloxacin (LEVAQUIN) 750 MG tablet Take 1 tablet (750 mg total) by mouth every other day.   No facility-administered encounter medications on file as of 08/19/2018.    No Known Allergies  ROS: no fever, chills.  +cough per HPI, and fatigue.  No chest pain, DOE, nausea, vomiting, bowel changes, hair/skin/nail changes.  Denies depression, see HPI.  No bleeding, bruising, rash, dysuria or  other problems.   PHYSICAL EXAM:  BP 112/70   Pulse 72   Ht _0  (1.753 m)   Wt 143 lb (64.9 kg)   BMI 21.12 kg/m   Wt Readings from Last 3 Encounters:  08/19/18 143 lb (64.9 kg)  07/27/18 145 lb (65.8 kg)  06/16/18 144 lb (65.3 kg)   Well appearing, thin female, with some throat-clearing but not coughing during visit.  She became mildly tearful in describing how she sometimes feels overwhelmed in caring for her sister, especially when not feeling well.  She exhibited full range of affect, smiling and laughing also during the visit.  Normal eye contact, speech, hygiene and grooming HEENT: conjunctiva and sclera are clear, EOMI, OP clear, moist mucus membranes. Neck: no lymphadenopathy, thyromegaly or carotid bruit Heart: regular rate and rhythm, no murmur Lungs: Coarse BS and some ronchi on the right, clear sounds on the left. Good air movement. No rales Abdomen: soft, nontender, no organomegaly or mass Back: no spinal or CVA tenderness Extremities: no edema, 2+ pulses Psych: as stated above Neuro: alert and oriented, cranial nerves intact, normal gait.   ASSESSMENT/PLAN:   Bronchiectasis with (acute) exacerbation (HCC) - somewhat improved.  f/u with pulm as scheduled. Cont use of flutter valve, Breo  Atherosclerosis of aorta (HCC) - cont aspirin, statin - Plan: Lipid panel  Coronary artery disease involving native coronary artery of native heart without angina pectoris - asymptomatic; continue aspirin and statin - Plan: Lipid panel  Malignant neoplasm of lower-inner quadrant of left breast in female, estrogen receptor positive ([place]) - contrinue letrozole, reviewed potential side effects. Sounds like fatigue has been more recent, not with onset of med  Osteopenia of neck of right femur - discussed Ca, D, and encouraged to get regular weight-bearing exercise  Medication monitoring encounter - Plan: Comprehensive metabolic panel, CBC with Differential/Platelet  Fatigue,  unspecified type - Ddx reviewed in detail. Encouraged proper hydration, exercise. Check labs - Plan: Comprehensive metabolic panel, CBC with Differential/Platelet   CAD/hyperlipidemia-  Due for fasting lipids, c-met.  Check CBC due to fatigue Normal thyroid in January, no other symptoms.    Please make sure to drink plenty of water, especially in this heat and humidity.  Urine should be a very light yellow.    We discussed trying to get some regular daily exercise, even if in 10-15 minute increments (when your lungs allow); try and get the weight-bearing exercise as we discussed at least twice a week to help prevent further bone loss.

## 2018-08-19 ENCOUNTER — Encounter: Payer: Self-pay | Admitting: Family Medicine

## 2018-08-19 ENCOUNTER — Ambulatory Visit (INDEPENDENT_AMBULATORY_CARE_PROVIDER_SITE_OTHER): Payer: Medicare Other | Admitting: Family Medicine

## 2018-08-19 VITALS — BP 112/70 | HR 72 | Ht 69.0 in | Wt 143.0 lb

## 2018-08-19 DIAGNOSIS — M85851 Other specified disorders of bone density and structure, right thigh: Secondary | ICD-10-CM | POA: Diagnosis not present

## 2018-08-19 DIAGNOSIS — J471 Bronchiectasis with (acute) exacerbation: Secondary | ICD-10-CM

## 2018-08-19 DIAGNOSIS — Z5181 Encounter for therapeutic drug level monitoring: Secondary | ICD-10-CM | POA: Diagnosis not present

## 2018-08-19 DIAGNOSIS — C50312 Malignant neoplasm of lower-inner quadrant of left female breast: Secondary | ICD-10-CM

## 2018-08-19 DIAGNOSIS — I2510 Atherosclerotic heart disease of native coronary artery without angina pectoris: Secondary | ICD-10-CM | POA: Diagnosis not present

## 2018-08-19 DIAGNOSIS — I700 Atherosclerosis of aorta: Secondary | ICD-10-CM | POA: Diagnosis not present

## 2018-08-19 DIAGNOSIS — Z170 Estrogen receptor positive status [ER+]: Secondary | ICD-10-CM

## 2018-08-19 DIAGNOSIS — R5383 Other fatigue: Secondary | ICD-10-CM | POA: Diagnosis not present

## 2018-08-19 LAB — CBC WITH DIFFERENTIAL/PLATELET
BASOS ABS: 0.1 10*3/uL (ref 0.0–0.2)
Basos: 1 %
EOS (ABSOLUTE): 0.1 10*3/uL (ref 0.0–0.4)
EOS: 2 %
HEMATOCRIT: 37.1 % (ref 34.0–46.6)
HEMOGLOBIN: 12.3 g/dL (ref 11.1–15.9)
Immature Grans (Abs): 0 10*3/uL (ref 0.0–0.1)
Immature Granulocytes: 0 %
LYMPHS ABS: 1.3 10*3/uL (ref 0.7–3.1)
Lymphs: 15 %
MCH: 28.8 pg (ref 26.6–33.0)
MCHC: 33.2 g/dL (ref 31.5–35.7)
MCV: 87 fL (ref 79–97)
MONOCYTES: 9 %
Monocytes Absolute: 0.8 10*3/uL (ref 0.1–0.9)
NEUTROS ABS: 6.5 10*3/uL (ref 1.4–7.0)
Neutrophils: 73 %
Platelets: 311 10*3/uL (ref 150–450)
RBC: 4.27 x10E6/uL (ref 3.77–5.28)
RDW: 12 % — ABNORMAL LOW (ref 12.3–15.4)
WBC: 8.8 10*3/uL (ref 3.4–10.8)

## 2018-08-19 LAB — COMPREHENSIVE METABOLIC PANEL
ALBUMIN: 4.3 g/dL (ref 3.5–4.8)
ALT: 10 IU/L (ref 0–32)
AST: 19 IU/L (ref 0–40)
Albumin/Globulin Ratio: 1.4 (ref 1.2–2.2)
Alkaline Phosphatase: 84 IU/L (ref 39–117)
BILIRUBIN TOTAL: 0.8 mg/dL (ref 0.0–1.2)
BUN/Creatinine Ratio: 37 — ABNORMAL HIGH (ref 12–28)
BUN: 22 mg/dL (ref 8–27)
CHLORIDE: 101 mmol/L (ref 96–106)
CO2: 27 mmol/L (ref 20–29)
Calcium: 9.8 mg/dL (ref 8.7–10.3)
Creatinine, Ser: 0.59 mg/dL (ref 0.57–1.00)
GFR calc non Af Amer: 89 mL/min/{1.73_m2} (ref >59)
GFR, EST AFRICAN AMERICAN: 103 mL/min/{1.73_m2} (ref >59)
GLUCOSE: 79 mg/dL (ref 65–99)
Globulin, Total: 3 g/dL (ref 1.5–4.5)
Potassium: 5.5 mmol/L — ABNORMAL HIGH (ref 3.5–5.2)
Sodium: 142 mmol/L (ref 134–144)
TOTAL PROTEIN: 7.3 g/dL (ref 6.0–8.5)

## 2018-08-19 LAB — LIPID PANEL
Chol/HDL Ratio: 2 ratio (ref 0.0–4.4)
Cholesterol, Total: 147 mg/dL (ref 100–199)
HDL: 72 mg/dL (ref >39)
LDL Calculated: 66 mg/dL (ref 0–99)
TRIGLYCERIDES: 45 mg/dL (ref 0–149)
VLDL Cholesterol Cal: 9 mg/dL (ref 5–40)

## 2018-08-19 NOTE — Patient Instructions (Signed)
   Please make sure to drink plenty of water, especially in this heat and humidity.  Urine should be a very light yellow.    We discussed trying to get some regular daily exercise, even if in 10-15 minute increments (when your lungs allow); try and get the weight-bearing exercise as we discussed at least twice a week to help prevent further bone loss.  Call your pharmacy when refills are needed.

## 2018-09-02 ENCOUNTER — Ambulatory Visit: Payer: Medicare Other | Admitting: Internal Medicine

## 2018-09-02 ENCOUNTER — Encounter: Payer: Self-pay | Admitting: Internal Medicine

## 2018-09-02 VITALS — BP 122/60 | HR 74 | Ht 69.0 in | Wt 143.6 lb

## 2018-09-02 DIAGNOSIS — Z23 Encounter for immunization: Secondary | ICD-10-CM

## 2018-09-02 DIAGNOSIS — R059 Cough, unspecified: Secondary | ICD-10-CM

## 2018-09-02 DIAGNOSIS — J479 Bronchiectasis, uncomplicated: Secondary | ICD-10-CM | POA: Diagnosis not present

## 2018-09-02 DIAGNOSIS — R05 Cough: Secondary | ICD-10-CM | POA: Diagnosis not present

## 2018-09-02 NOTE — Patient Instructions (Signed)
Problem List Items Addressed This Visit    None    Visit Diagnoses    Bronchiectasis without complication ([place])    -  Primary      - stable disease  Plan - Start Mucinex to take daily and if that is too much intake and as needed  - Take samples - Definitely do flutter valve daily - Continue Brio daily - High dose flu shot today - Albuterol as needed - CT chest without contrast in 6 months - Spirometry and DLCO in 6 months  Follow-up - With Dr. Chase Caller in 6 months but after CT chest and spirometry

## 2018-09-02 NOTE — Progress Notes (Signed)
IOV 06/09/2015  Chief Complaint  Patient presents with  . Pulmonary Consult    Pt referred by Dr. Tomi Bamberger for abnormal CT.    77 year old female somewhat of a poor historian. History is gained from repeatedly asking the same questions in many different ways and also review of Dr Rita Ohara notes in the EMR   She reports that she is a 40 pack smoker having quit 34 years ago. In the 1970s and 1980s she had recurrent bouts of pneumonia which left her with bronchiectasis. At baseline she's had some chronic cough of unclear severity and shortness of breath with exertion of unclear severity but probably mild. Unclear if she has associated sputum. Unclear what aggravating and relieving factors where or are. Then in for a 2016 she developed over several weeks worsening cough with green sputum and some fever. Apparently was diagnosed with pneumonia. This then resulted in a CT scan of the chest and her 2016 which I personally reviewed and shows bilateral bronchiectasis particularly on the right middle lobe. She also had a nodule. Antibody treatment she improved. Then in April 2016 was started on Brio which inhaler therapy help her cough and shortness of breath significantly. Currently these are not much much of an issue for her. She has a good quality of life with the Brio. The Brio is expensive. She had follow-up CT scan of the chest 04/26/2015 that shows resolution of the nodule but persistence of the bronchiectasis. In fact she seems to have chronic bronchiectasis dating back on several chest x-rays although it back to 2009. She had test that she's had bronchiectasis since the 1980s. Currently no sputum production not much of a cough or shortness of breath. She denies having had bronchoscopy for the same.  She is not interested in much testing  Noted spirometry pre-breo March 2016  - fev 1.14L/45%, R 56 - severe obstructive lung disease And post Breo in June 05 2015  - Fev 1.32L/52%, R 57 ->  significantly improved but still in moderate category   OV 11/06/2015  Chief Complaint  Patient presents with  . Follow-up    Pt here after PFT. Pt states she respiratory infection in October and was on 2 rounds of abx and is now back to her baseline. Pt c/o mild non prod cough. Pt denies CP/tightness.    Previous heavy smoker with right middle lobe chronic bronchiectasis with findings suggestive of MAI and moderate/severe obstructive lung disease with findings of emphysema on CT chest - February and May 2016  - Last seen in June 2016. At that time he started Brio. Overall she has been doing well. However in October 2016 she had a respiratory exacerbation. She was treated with 2 rounds of anabiotic's and prednisone course and then she fell baseline. She is followed up with a pulmonary function test  t 10/30/2015) baseline and well. 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 Currently she is feeling well. Review of vaccine shows she is up-to-date with flu shot. She thinks she has had the pneumonia vaccine with her primary care physician but she's not sure and she is going to check with her.  No new issues.  OV 02/12/2016  Chief Complaint  Patient presents with  . Follow-up    Pt reports having some increased breathing issues since last OV- treated a few times with abx/pred for infections. Most recent  x 1 week ago- Zpak w/Pred taper. Pt notes some green mucus but cough is much improved.   Heavy smoker with right middle lobe chronic bronchiectasis and some lingular bronchiectasis and scattered infiltrates in the lower lobe based on CT February and May 2016. May 2016 with improvement   - This is a routine follow-up. Last seen November 2016. She is compliant with her inhalers. In the interim approximately on Valentine's Day 2017 had another flareup and he called in Z-Pak and prednisone. This has  helped. Cough is significantly worse but it has improved although not at baseline. She slowly getting better. This will be her third exacerbation. Review of the chart shows an visualization of the CT chest which I did myself and showed her shows findings described above. Chart review also shows that do not had a complete bronchiectasis hematological and etiological workup. She is open to having bronchoscopy   OV 04/23/2016  Chief Complaint  Patient presents with  . Follow-up    Pt here after CT chest. Pt states her breathing has improved since last OV. Pt states she is still coughing in the morning but now there is less mucus production.      Heavy smoker with right middle lobe chronic bronchiectasis and some lingular bronchiectasis and scattered infiltrates in the lower lobe based on CT February and May 2016. PFT Nov 2016 -> fev 1.36L/51%, R 60. DLCO 16/51%   In  Nov 2016 I added spiriva and now on triple inhaler Rx- says with this symptoms she is significantly better. Still with cough. Some spuitum +. Labs for etiology reviewed done feb 2017 -> IgG, IgA, IgM - normal. Allpha 1AT - normal. Autoimmune - ANA, CCP, dsDNA, RF, ENA, SSA, SSB, scl-70, RNP - negative.    OV 07/01/2016  Chief Complaint  Patient presents with  . Follow-up    Follow up bronchoscopy.  pt c/o R sided back pain X3 days, nonprod cough.      Idiopathic bronchiectasis. Here to review results. Underwent bronchoscopy with lavage 06/21/2016 that showed acute neutrophils 91%. Culture was negative. AFB smear is negative fungal smear was negative. AFB culture is pending. Bronchoscopy showed a lot of mucus. She says that when she lies on she has significant cough and mucus. She feels that after bronchoscopy she improved because of the removal of mucus but shortly after this for the last few days she's having right infrascapular pleuritic and off chest tightness that is consistent with previous exacerbation and she feels she needs  an antibiotic and prednisone course. She is compliant with Spiriva and Brio at this point. Overall even at baseline she admits to copious white mucus.   OV 09/16/2016  Chief Complaint  Patient presents with  . Follow-up    Pt states that she does have some lung congestion, little cough and wheezing with SOB. Pt states that she is trying to learn when to "rest" and not overdo herself. Needs refills Breo and Spiriva Resp.    Follow-up idiopathic bronchiectasis. Last visit mid July 2017. After that called in 09/04/2016 with symptoms of bronchiectasis exacerbation. Called in Z-Pak and prednisone. She is back to baseline. At this point in time she tells me that she does not have daily mucus other than mild amounts. She does admit that she is noncompliant with physician with chest physical therapy. She recollects that one time she position herself in an inversion table for back therapy and this cleared of the mucus and she felt much better. She admits  that she needs to do a better job with positional therapy. She is up-to-date with flu shot. She continues on Spiriva and Brio triple inhaler therapy. There are no new issues.   OV 04/24/2017  Chief Complaint  Patient presents with  . Acute Visit    Pt c/o hoarseness, prod cough with green mucus, chest tightness x 2 days. Pt denies f/c/s and increase in SOB.   Acute visit for this patient with idiopathic bronchiectasis.  December 2017 she saw Dr. Baltazar Apo with an acute exacerbation. After that she went back to baseline. She does not use chest physical therapy but she uses inhaler therapy only. Now for the last 2 days she's having increased cough with chest tightness and green mucous. Baseline is clear yellow mucus. Low-grade fever present in the office. No wheezing or increased shortness of breath. Significant hoarseness of voice present and some postnasal drip present. She feels she is in bronchiectasis exacerbation. There are no other issues. It is  noted that in July 2017 bronchoscopy did not reveal any organisms on lavage    03/17/2018 Follow up : Bronchiectasis /COPD  Returns for a follow-up for bronchiectasis/COPD .  Says that she has been doing well over the last year.  She had no increased cough or congestion.  No recent antibiotic use.  She denies any shortness of breath, hemoptysis, weight loss, edema.  She does have an occasional cough that is minimally productive.  She is very active and independent.  She remains on BREO daily .,  Does depend on samples.  Has not taken Spiriva in >1 year. Has not felt that she needed this.  Recently diagnosed left breast cancer. Plans for surgery later this week.    OV 09/02/2018  Subjective:  Patient ID: Molly Ross, female , DOB: 1941-01-01 , age 56 y.o. , MRN: [id number] , ADDRESS: [id number] [id number]   09/02/2018 -   Chief Complaint  Patient presents with  . Follow-up    Pt states she has been doing good since last visit except she does have complaints of fatigue and is coughing with occ green to yellow mucus. States cough is worse in the mornings.   77 year old female former smoker followed for COPD and Bronchiectasis. 10/30/2015) baseline and well. Post broncho-dilator FEV1 1.36 L/51% and a ratio 60, total lung capacity 5.2 L/90%, DLCO 16/51%. Last CT 2016 and May 2017 and CXR aug 2019 with chronic changes   HPI Molly Ross 77 y.o. - female with bronchiectasis maintained on Brio. I personally have not seen her since May 2018. Since then she has seen my colleagues for flareupsand gotten anabiotic's of prednisone. Most recently in August 2019. Chest x-ray that time showed just chronic changes. Since that time overall she's feeling well. She is stable. She has sputum production every few days. She uses a flutter valve as needed only. Sometimes of sputum is thick. But overall stable no change in color. Does not feel she is in a flareup.She continuesl and the  fluas needed. She is not on Mucinex. She wants to have the flu shot today.he does been a few years since she had pulmonary function test.   She takes care of her sister in a wheelchair. Another sister died from ovarian cancer.   ROS - per HPI     has a past medical history of Bronchiectasis ([place]), Cancer ([place]), Colon polyp, Dyspnea, Family history of lung cancer, Family history of ovarian cancer, Family history of prostate  cancer, GERD (gastroesophageal reflux disease), History of radiation therapy (04/16/18- 05/14/18), Shingles (1/09 and 07/2010), and Unspecified vitamin D deficiency (02/2011 and 2016).   reports that she quit smoking about 38 years ago. Her smoking use included cigarettes. She has a 48.00 pack-year smoking history. She has never used smokeless tobacco.  Past Surgical History:  Procedure Laterality Date  . BREAST LUMPECTOMY WITH RADIOACTIVE SEED AND SENTINEL LYMPH NODE BIOPSY Left 03/19/2018   Procedure: BREAST LUMPECTOMY WITH RADIOACTIVE SEED AND SENTINEL LYMPH NODE BIOPSY;  Surgeon: Stark Klein, MD;  Location: [facility];  Service: General;  Laterality: Left;  . CATARACT EXTRACTION, BILATERAL Bilateral 03/2017, 04/2017   [facility], Dr. Alanda Slim  . SHOULDER SURGERY  03/2011   for frozen shoulder, and cyst removal (Dr. Percell Miller)  . TONSILLECTOMY  child  . VIDEO BRONCHOSCOPY Bilateral 06/21/2016   Procedure: VIDEO BRONCHOSCOPY WITHOUT FLUORO;  Surgeon: Brand Males, MD;  Location: [facility];  Service: Cardiopulmonary;  Laterality: Bilateral;    No Known Allergies  Immunization History  Administered Date(s) Administered  . Influenza, High Dose Seasonal PF 01/26/2014, 10/19/2014, 08/30/2015, 08/08/2016, 08/05/2017  . Pneumococcal Conjugate-13 12/25/2015  . Pneumococcal Polysaccharide-23 12/30/2016  . Tdap 09/03/2017    Family History  Problem Relation Age of Onset  . Stroke Mother   . Diabetes Mother   . Stroke Sister 71       brainstem stroke  .  Diabetes Sister   . Seizures Sister   . Lung cancer Father 72  . Ovarian cancer Sister 60       metastatic to lung, liver  . Cancer Sister   . Ovarian cancer Maternal Aunt 95  . Heart disease Maternal Grandmother   . Prostate cancer Cousin   . Breast cancer Neg Hx      Current Outpatient Medications:  .  Ascorbic Acid (VITAMIN C) 100 MG tablet, Take 1 tablet (100 mg total) by mouth daily., Disp: , Rfl:  .  aspirin EC 81 MG tablet, Take 81 mg by mouth daily., Disp: , Rfl:  .  atorvastatin (LIPITOR) 20 MG tablet, Take 1 tablet (20 mg total) by mouth daily., Disp: 90 tablet, Rfl: 1 .  Calcium-Magnesium-Vitamin D (CALCIUM 500 PO), Take 1 tablet by mouth daily., Disp: , Rfl:  .  fish oil-omega-3 fatty acids 1000 MG capsule, Take 3 g by mouth daily. , Disp: , Rfl:  .  fluticasone furoate-vilanterol (BREO ELLIPTA) 100-25 MCG/INH AEPB, Inhale 1 puff into the lungs daily., Disp: 1 each, Rfl: 5 .  letrozole (FEMARA) 2.5 MG tablet, Take 1 tablet (2.5 mg total) by mouth daily., Disp: 90 tablet, Rfl: 3 .  Multiple Vitamins-Minerals (MULTIVITAMIN WITH MINERALS) tablet, Take 1 tablet by mouth daily., Disp: , Rfl:       Objective:   Vitals:   09/02/18 1012  BP: 122/60  Pulse: 74  SpO2: 95%  Weight: 143 lb 9.6 oz (65.1 kg)  Height: 5\' 9"  (1.753 m)    Estimated body mass index is 21.21 kg/m as calculated from the following:   Height as of this encounter: 5\' 9"  (1.753 m).   Weight as of this encounter: 143 lb 9.6 oz (65.1 kg).  @WEIGHTCHANGE @  [employer]   09/02/18 1012  Weight: 143 lb 9.6 oz (65.1 kg)     Physical Exam  General Appearance:    Alert, cooperative, no distress, appears stated age - yes , sitting on - chair  Head:    Normocephalic, without obvious abnormality, atraumatic  Eyes:  PERRL, conjunctiva/corneas clear,  Ears:    Normal TM's and external ear canals, both ears  Nose:   Nares normal, septum midline, mucosa normal, no drainage    or sinus tenderness.  OXYGEN ON  - rooma  . Patient is @ room air   Throat:   Lips, mucosa, and tongue normal; teeth and gums normal. Cyanosis on lips - no  Neck:   Supple, symmetrical, trachea midline, no adenopathy;    thyroid:  no enlargement/tenderness/nodules; no carotid   bruit or JVD  Back:     Symmetric, no curvature, ROM normal, no CVA tenderness  Lungs:     Distress - no , Wheeze no, Barrell Chest - no, Purse lip breathing - no, Crackles - no   Chest Wall:    No tenderness or deformity. Scars in chest no   Heart:    Regular rate and rhythm, S1 and S2 normal, no rub   or gallop, Murmur - no  Breast Exam:    NOT DONE  Abdomen:     Soft, non-tender, bowel sounds active all four quadrants,    no masses, no organomegaly  Genitalia:   NOT DONE  Rectal:   NOT DONE  Extremities:   Extremities normal, atraumatic, Clubbing - no, Edema - no  Pulses:   2+ and symmetric all extremities  Skin:   Stigmata of Connective Tissue Disease - no  Lymph nodes:   Cervical, supraclavicular, and axillary nodes normal  Psychiatric:  Neurologic:   pleasant CNII-XII intact, normal strength, sensation  throughout           Assessment:     No diagnosis found.     Plan:        Patient Instructions   Problem List Items Addressed This Visit    None    Visit Diagnoses    Bronchiectasis without complication ([place])    -  Primary      - stable disease  Plan - Start Mucinex to take daily and if that is too much intake and as needed  - Take samples - Definitely do flutter valve daily - Continue Brio daily - High dose flu shot today - Albuterol as needed - CT chest without contrast in 6 months - Spirometry and DLCO in 6 months  Follow-up - With Dr. Chase Caller in 6 months but after CT chest and spirometry            SIGNATURE    Dr. Brand Males, M.D., F.C.C.P,  Pulmonary and Critical Care Medicine Staff Physician, [place] Director - Interstitial Lung Disease  Program    Pulmonary [facility] at [place], [place], [address]  Pager: [phone], If no answer or between  15:00h - 7:00h: call [phone] Telephone: [phone]  10:30 AM 09/02/2018

## 2018-09-02 NOTE — Addendum Note (Signed)
Addended by: Lorretta Harp on: 09/02/2018 10:45 AM   Modules accepted: Orders

## 2018-09-26 ENCOUNTER — Other Ambulatory Visit: Payer: Self-pay | Admitting: Pulmonary Disease

## 2018-09-26 MED ORDER — LEVOFLOXACIN 750 MG PO TABS: 750.0000 mg | ORAL_TABLET | Freq: Every day | ORAL | 0 refills | Status: DC

## 2018-09-26 NOTE — Progress Notes (Signed)
Patient called in via answering service.  Complaining of pain in her left lung characteristic of a flare of her bronchiectasis. She states that she has been coughing more in last week.  Denies significant increase in her dyspnea.  She has not had any fever or chills.  I have phoned in a prescription for levofloxacin 750 mg daily for 7 days to treat a flareup of bronchiectasis.  I have advised the patient to take over-the-counter pain medication.  She was advised to contact the office on Monday if her symptoms did not abate and to come to the emergency department if her dyspnea were to worsen significantly or the pain not be controlled with over-the-counter medications.  I advised her to continue her usual bronchodilators.

## 2018-09-30 ENCOUNTER — Ambulatory Visit: Payer: Medicare Other | Admitting: Primary Care

## 2018-09-30 ENCOUNTER — Ambulatory Visit (INDEPENDENT_AMBULATORY_CARE_PROVIDER_SITE_OTHER)
Admission: RE | Admit: 2018-09-30 | Discharge: 2018-09-30 | Disposition: A | Discharge status: Home / Self Care | Payer: Medicare Other | Source: Ambulatory Visit | Attending: Primary Care | Admitting: Primary Care

## 2018-09-30 ENCOUNTER — Telehealth: Payer: Self-pay | Admitting: Internal Medicine

## 2018-09-30 ENCOUNTER — Other Ambulatory Visit (INDEPENDENT_AMBULATORY_CARE_PROVIDER_SITE_OTHER): Payer: Medicare Other

## 2018-09-30 ENCOUNTER — Encounter: Payer: Self-pay | Admitting: Primary Care

## 2018-09-30 VITALS — BP 110/64 | HR 82 | Temp 97.0°F | Ht 69.0 in | Wt 144.0 lb

## 2018-09-30 DIAGNOSIS — R0781 Pleurodynia: Secondary | ICD-10-CM

## 2018-09-30 DIAGNOSIS — R05 Cough: Secondary | ICD-10-CM | POA: Diagnosis not present

## 2018-09-30 DIAGNOSIS — N61 Mastitis without abscess: Secondary | ICD-10-CM | POA: Diagnosis not present

## 2018-09-30 DIAGNOSIS — J471 Bronchiectasis with (acute) exacerbation: Secondary | ICD-10-CM | POA: Diagnosis not present

## 2018-09-30 LAB — CBC WITH DIFFERENTIAL/PLATELET
Basophils Absolute: 0 10*3/uL (ref 0.0–0.1)
Basophils Relative: 0.5 % (ref 0.0–3.0)
EOS ABS: 0.1 10*3/uL (ref 0.0–0.7)
EOS PCT: 0.8 % (ref 0.0–5.0)
HCT: 37.7 % (ref 36.0–46.0)
Hemoglobin: 12.3 g/dL (ref 12.0–15.0)
LYMPHS ABS: 1.2 10*3/uL (ref 0.7–4.0)
Lymphocytes Relative: 12.1 % (ref 12.0–46.0)
MCHC: 32.5 g/dL (ref 30.0–36.0)
MCV: 88.4 fl (ref 78.0–100.0)
MONO ABS: 0.8 10*3/uL (ref 0.1–1.0)
Monocytes Relative: 7.9 % (ref 3.0–12.0)
NEUTROS ABS: 8 10*3/uL — AB (ref 1.4–7.7)
Neutrophils Relative %: 78.7 % — ABNORMAL HIGH (ref 43.0–77.0)
PLATELETS: 317 10*3/uL (ref 150.0–400.0)
RBC: 4.26 Mil/uL (ref 3.87–5.11)
RDW: 14 % (ref 11.5–15.5)
WBC: 10.1 10*3/uL (ref 4.0–10.5)

## 2018-09-30 LAB — BASIC METABOLIC PANEL
BUN: 17 mg/dL (ref 6–23)
CHLORIDE: 102 meq/L (ref 96–112)
CO2: 33 mEq/L — ABNORMAL HIGH (ref 19–32)
CREATININE: 0.57 mg/dL (ref 0.40–1.20)
Calcium: 10.4 mg/dL (ref 8.4–10.5)
GFR: 109.35 mL/min (ref >60.00)
GLUCOSE: 111 mg/dL — AB (ref 70–99)
POTASSIUM: 3.8 meq/L (ref 3.5–5.1)
Sodium: 141 mEq/L (ref 135–145)

## 2018-09-30 MED ORDER — TRAMADOL HCL 50 MG PO TABS: 50.0000 mg | ORAL_TABLET | Freq: Four times a day (QID) | ORAL | 0 refills | Status: DC | PRN

## 2018-09-30 NOTE — Telephone Encounter (Signed)
Called and spoke with pt who states she is not feeling well. Pt states Saturday, 10/12 she woke up with excruciating pain in left lung. Doc on call ordered an abx to help with that.  Pt states she has two more doses of abx left but states she is now having pain in her right lung as well.  Pt denies any fever.  Pt is wanting to know if something else can be prescribed due to the pain now being in the other lung. States she feels like she is having a bronchiectasis flare.  Beth, please advise on this for pt. Thanks!

## 2018-09-30 NOTE — Progress Notes (Addendum)
@Patient  ID: Ned Card, female    DOB: 10-Dec-1941, 77 y.o.   MRN: [id number]  Chief Complaint  Patient presents with  . Acute Visit    Pain right lung esoecially with cough, cough with green mucus, SOB with exertion, chest pain tightness and wheezing    Referring provider: Rita Ohara, MD  HPI: 77 year old female, former smoker. PMH bronchiectasis, COPD, left breast cancer. Patient of Dr. Chase Caller. Maintained on Breo 100.   10/01/2018 Patient presents today with acute complaints of cough with green mucus and wheezing. Associated pleuritic pain. Thinks she may be having flare of her bronchiectasis. MD called in course of Levaquin, she has 2 doses remaining. Left breast with red area, warm to touch. Right upper lung pain with deep breath  No fever or recent travel.   No Known Allergies  Immunization History  Administered Date(s) Administered  . Influenza, High Dose Seasonal PF 01/26/2014, 10/19/2014, 08/30/2015, 08/08/2016, 08/05/2017, 09/02/2018  . Pneumococcal Conjugate-13 12/25/2015  . Pneumococcal Polysaccharide-23 12/30/2016  . Tdap 09/03/2017    Past Medical History:  Diagnosis Date  . Bronchiectasis ([place])   . Cancer ([place])   . Colon polyp   . Dyspnea   . Family history of lung cancer   . Family history of ovarian cancer   . Family history of prostate cancer   . GERD (gastroesophageal reflux disease)   . History of radiation therapy 04/16/18- 05/14/18   42.56 Gy directed to the Left Breast in 16 fractions followed by a boost of 8 Gy in 4 fractions  . Shingles 1/09 and 07/2010  . Unspecified vitamin D deficiency 02/2011 and 2016    Tobacco History: Social History   Tobacco Use  Smoking Status Former Smoker  . Packs/day: 2.00  . Years: 24.00  . Pack years: 48.00  . Types: Cigarettes  . Last attempt to quit: 06/28/1980  . Years since quitting: 38.2  Smokeless Tobacco Never Used  Tobacco Comment   smoked 1-2 PPD x 24 years;   Counseling given: Not  Answered Comment: smoked 1-2 PPD x 24 years;   Outpatient Medications Prior to Visit  Medication Sig Dispense Refill  . Ascorbic Acid (VITAMIN C) 100 MG tablet Take 1 tablet (100 mg total) by mouth daily.    Marland Kitchen aspirin EC 81 MG tablet Take 81 mg by mouth daily.    Marland Kitchen atorvastatin (LIPITOR) 20 MG tablet Take 1 tablet (20 mg total) by mouth daily. 90 tablet 1  . Calcium-Magnesium-Vitamin D (CALCIUM 500 PO) Take 1 tablet by mouth daily.    . fish oil-omega-3 fatty acids 1000 MG capsule Take 3 g by mouth daily.     . fluticasone furoate-vilanterol (BREO ELLIPTA) 100-25 MCG/INH AEPB Inhale 1 puff into the lungs daily. 1 each 5  . letrozole (FEMARA) 2.5 MG tablet Take 1 tablet (2.5 mg total) by mouth daily. 90 tablet 3  . Multiple Vitamins-Minerals (MULTIVITAMIN WITH MINERALS) tablet Take 1 tablet by mouth daily.    Marland Kitchen levofloxacin (LEVAQUIN) 750 MG tablet Take 1 tablet (750 mg total) by mouth daily. 7 tablet 0   No facility-administered medications prior to visit.     Review of Systems  Review of Systems  Respiratory: Positive for cough and wheezing.        Right upper lung pain with deep breath   Cardiovascular: Negative.    Physical Exam  BP 110/64 (BP Location: Right Arm, Cuff Size: Normal)   Pulse 82   Temp (!) 97 F (  36.1 C)   Ht 5\' 9"  (1.753 m)   Wt 144 lb (65.3 kg)   SpO2 97%   BMI 21.27 kg/m  Physical Exam  Constitutional: She is oriented to person, place, and time. She appears well-developed and well-nourished. No distress.  HENT:  Head: Normocephalic and atraumatic.  Eyes: Pupils are equal, round, and reactive to light. EOM are normal.  Neck: Normal range of motion. Neck supple.  Cardiovascular: Normal rate and regular rhythm.  Pulmonary/Chest: Effort normal. No respiratory distress.  Scattered rhonchi  Musculoskeletal: Normal range of motion.  Neurological: She is alert and oriented to person, place, and time.  Skin: Skin is warm and dry.  Left breast warm to  touch, mild-mod erythema . No drainage  Psychiatric: She has a normal mood and affect. Her behavior is normal. Judgment and thought content normal.     Lab Results:  CBC    Component Value Date/Time   WBC 10.1 09/30/2018 1657   RBC 4.26 09/30/2018 1657   HGB 12.3 09/30/2018 1657   HGB 12.3 08/19/2018 1142   HCT 37.7 09/30/2018 1657   HCT 37.1 08/19/2018 1142   PLT 317.0 09/30/2018 1657   PLT 311 08/19/2018 1142   MCV 88.4 09/30/2018 1657   MCV 87 08/19/2018 1142   MCH 28.8 08/19/2018 1142   MCH 29.4 03/11/2018 0848   MCHC 32.5 09/30/2018 1657   RDW 14.0 09/30/2018 1657   RDW 12.0 (L) 08/19/2018 1142   LYMPHSABS 1.2 09/30/2018 1657   LYMPHSABS 1.3 08/19/2018 1142   MONOABS 0.8 09/30/2018 1657   EOSABS 0.1 09/30/2018 1657   EOSABS 0.1 08/19/2018 1142   BASOSABS 0.0 09/30/2018 1657   BASOSABS 0.1 08/19/2018 1142    BMET    Component Value Date/Time   NA 141 09/30/2018 1657   NA 142 08/19/2018 1142   K 3.8 09/30/2018 1657   CL 102 09/30/2018 1657   CO2 33 (H) 09/30/2018 1657   GLUCOSE 111 (H) 09/30/2018 1657   BUN 17 09/30/2018 1657   BUN 22 08/19/2018 1142   CREATININE 0.57 09/30/2018 1657   CREATININE 0.69 03/11/2018 0848   CREATININE 0.56 (L) 09/01/2017 1307   CALCIUM 10.4 09/30/2018 1657   GFRNONAA 89 08/19/2018 1142   GFRNONAA >60 03/11/2018 0848   GFRAA 103 08/19/2018 1142   GFRAA >60 03/11/2018 0848    BNP    Component Value Date/Time   BNP 20.3 07/28/2013 1647    ProBNP No results found for: PROBNP  Imaging: Dg Chest 2 View  Result Date: 10/01/2018 CLINICAL DATA:  Cough, left lung pain for 4 days. EXAM: CHEST - 2 VIEW COMPARISON:  07/27/2018 FINDINGS: There is hyperinflation of the lungs compatible with COPD. Chronic reticulonodular interstitial prominence throughout the lungs and bronchiectasis. No acute confluent airspace opacity or effusion. No acute bony abnormality. IMPRESSION: COPD/chronic bronchiectatic changes.  No acute findings.  Electronically Signed   By: Rolm Baptise M.D.   On: 10/01/2018 09:03     Assessment & Plan:   Cellulitis of left breast - Left breast with diffuse mild-moderate erythema and warm to touch. No drainage  - Tx doxycyline course  - Follow up with PCP/ radiation-oncology   Bronchiectasis with (acute) exacerbation (HCC) - Cough with green mucus - No improvement with Levaquin - Change to doxycyline and start prednisone taper - CXR showed COPD/chronic bronchiectatic changes. No acute findings.   Pleuritic pain - Right upper lobe pain with deep breath - Ddimer mildly elevated at 0.6, likely related to  age, inflammation and cough  - CXR negative for pna  - Low suspicion for PE, no recent travel and HR/O2 level normal  - Recommend tramadol prn pain      Martyn Ehrich, NP 10/01/2018

## 2018-09-30 NOTE — Telephone Encounter (Signed)
Called and spoke with pt letting her know that she needed to come in for an appt.  Pt stated she was supposed to be going back to work tomorrow. I stated to pt we had an opening this afternoon at 4:30. Pt stated to go ahead and schedule her at that time and she would head straight to the office.  I have scheduled the appt and I have skyped both Beth and Braulio Conte to let them know.  Nothing further needed.

## 2018-09-30 NOTE — Telephone Encounter (Signed)
She should be seen in office, likely with needs labs and CXR. If she can take ibuprofen would advise that with food and heat compress to side.

## 2018-09-30 NOTE — Patient Instructions (Addendum)
CXR and labs today Will call tomorrow with results   Tramadol for pain - take 1 tab every 6 hours as needed  ED if symptoms worsen

## 2018-10-01 ENCOUNTER — Encounter: Payer: Self-pay | Admitting: Adult Health

## 2018-10-01 ENCOUNTER — Telehealth: Payer: Self-pay

## 2018-10-01 ENCOUNTER — Telehealth: Payer: Self-pay | Admitting: Adult Health

## 2018-10-01 ENCOUNTER — Encounter: Payer: Self-pay | Admitting: Primary Care

## 2018-10-01 ENCOUNTER — Inpatient Hospital Stay: Payer: Medicare Other | Attending: Hematology and Oncology | Admitting: Adult Health

## 2018-10-01 VITALS — BP 148/56 | HR 85 | Temp 97.7°F | Resp 18 | Ht 69.0 in | Wt 145.4 lb

## 2018-10-01 DIAGNOSIS — R21 Rash and other nonspecific skin eruption: Secondary | ICD-10-CM

## 2018-10-01 DIAGNOSIS — N61 Mastitis without abscess: Secondary | ICD-10-CM | POA: Insufficient documentation

## 2018-10-01 DIAGNOSIS — R0781 Pleurodynia: Secondary | ICD-10-CM

## 2018-10-01 DIAGNOSIS — Z79811 Long term (current) use of aromatase inhibitors: Secondary | ICD-10-CM

## 2018-10-01 DIAGNOSIS — C50312 Malignant neoplasm of lower-inner quadrant of left female breast: Secondary | ICD-10-CM | POA: Insufficient documentation

## 2018-10-01 DIAGNOSIS — Z170 Estrogen receptor positive status [ER+]: Secondary | ICD-10-CM | POA: Insufficient documentation

## 2018-10-01 HISTORY — DX: Pleurodynia: R07.81

## 2018-10-01 HISTORY — DX: Mastitis without abscess: N61.0

## 2018-10-01 LAB — D-DIMER, QUANTITATIVE: D-Dimer, Quant: 0.63 mcg/mL FEU — ABNORMAL HIGH (ref <0.50)

## 2018-10-01 MED ORDER — PREDNISONE 10 MG PO TABS: ORAL_TABLET | ORAL | 0 refills | Status: DC

## 2018-10-01 MED ORDER — DOXYCYCLINE HYCLATE 100 MG PO TABS: 100.0000 mg | ORAL_TABLET | Freq: Two times a day (BID) | ORAL | 0 refills | Status: DC

## 2018-10-01 NOTE — Telephone Encounter (Signed)
-----   Message from Gardenia Phlegm, NP sent at 10/01/2018  8:00 AM EDT ----- Regarding: RE: new left breast inflammation Hey.  I will have [name] reach out and schedule this patient for today or first thing tomorrow.  Thanks,  Mendel Ryder ----- Message ----- From: Eppie Gibson, MD Sent: 09/30/2018   6:01 PM EDT To: Ernst Spell, RN, # Subject: RE: new left breast inflammation               Anderson Malta, she should be seen to rule out mastitis, radiation recall, recurrence, etc.  Ria Comment or Loleta Dicker- can you see her?  Per my previous note,  She saw Dr. Lindi Adie on 05/14/18 and was instructed to begin Letrozole 2.5 mg daily. She has an appointment with Survivorship on 08/11/18." But she no showed and hasn't been in med onc since...  If you can see her, and the rash appears well demarcated to mimic typical radiation fields, please take a photo and I can tell you if it is a RARE case of radiation recall  Anderson Malta, if they cannot see her, then please arrange symptom management clinic. And ask for a photo of her breast as above. thx   ----- Message ----- From: Ernst Spell, RN Sent: 09/30/2018   3:30 PM EDT To: Eppie Gibson, MD Subject: new left breast inflammation                   Molly Ross called. She is concerned about new inflammation to her Left Breast. She tells me that the breast is warm to touch and slightly swollen as well. She is concerned because she has a current flare-up of her bronchiectasis and is taking antibiotics on the same side. I advised her to contact her PCP about her concerns, but also told her that I would let you know as well (at her request).  She completed radiation to her left breast 05/14/18.

## 2018-10-01 NOTE — Assessment & Plan Note (Signed)
-   Right upper lobe pain with deep breath - Ddimer mildly elevated at 0.6, likely related to age, inflammation and cough  - CXR negative for pna  - Low suspicion for PE, no recent travel and HR/O2 level normal  - Recommend tramadol prn pain

## 2018-10-01 NOTE — Assessment & Plan Note (Addendum)
-   Left breast with diffuse mild-moderate erythema and warm to touch. No drainage  - Tx doxycyline course  - Follow up with PCP/ radiation-oncology

## 2018-10-01 NOTE — Progress Notes (Signed)
Breast  

## 2018-10-01 NOTE — Telephone Encounter (Signed)
LVM for pt to call back so we can schedule an appt to see [facility] today or tomorrow.

## 2018-10-01 NOTE — Progress Notes (Signed)
[facility] Follow up:    Molly Ross, [place] [address]   DIAGNOSIS: Cancer Staging Malignant neoplasm of lower-inner quadrant of left breast in female, estrogen receptor positive ([place]) Staging form: Breast, AJCC 8th Edition - Clinical stage from 03/11/2018: Stage IA (cT1c, cN0, cM0, G2, ER+, PR+, HER2-) - Unsigned - Pathologic: Stage IA (pT1c, pN1a, cM0, G1, ER+, PR+, HER2-) - Signed by Eppie Gibson, MD on 04/10/2018   SUMMARY OF ONCOLOGIC HISTORY:   Malignant neoplasm of lower-inner quadrant of left breast in female, estrogen receptor positive ([place])   03/06/2018 Initial Diagnosis    Screening detected left breast mass at 8 o'clock position 1.2 cm in size lower inner quadrant axilla is negative biopsy revealed grade 2 invasive ductal carcinoma ER 100%, PR 90%, Ki-67 5%, HER-2 negative, T1 be N0 stage I a clinical stage    03/19/2018 Surgery    Left Lumpectomy: IDC Grade 1, 1.3 cm, 1/3 LN Positive, with LVI, T1cN1 ER 100%, PR 90%, Ki-67 5%, HER-2 negative Stage 1B    03/21/2018 Genetic Testing    The Common Hereditary Cancer Panel offered by [facility] includes sequencing and/or deletion duplication testing of the following 47 genes: APC, ATM, AXIN2, BARD1, BMPR1A, BRCA1, BRCA2, BRIP1, CDH1, CDKN2A (p14ARF), CDKN2A (p16INK4a), CKD4, CHEK2, CTNNA1, DICER1, EPCAM (Deletion/duplication testing only), GREM1 (promoter region deletion/duplication testing only), KIT, MEN1, MLH1, MSH2, MSH3, MSH6, MUTYH, NBN, NF1, NHTL1, PALB2, PDGFRA, PMS2, POLD1, POLE, PTEN, RAD50, RAD51C, RAD51D, SDHB, SDHC, SDHD, SMAD4, SMARCA4. STK11, TP53, TSC1, TSC2, and VHL.  The following genes were evaluated for sequence changes only: SDHA and HOXB13 c.251G>A variant only.  Results: No pathogenic variants identified.   A variant of uncertain significance in the gene SMARCA4 was identified c.5011C>T ([id number]).  The date of this test report is 03/21/2018    04/10/2018 Cancer  Staging    Staging form: Breast, AJCC 8th Edition - Pathologic: Stage IA (pT1c, pN1a, cM0, G1, ER+, PR+, HER2-) - Signed by Eppie Gibson, MD on 04/10/2018    04/16/2018 - 05/14/2018 Radiation Therapy    Adjuvant radiation therapy    05/2018 -  Anti-estrogen oral therapy    Letrozole daily     CURRENT THERAPY: Letrozole  INTERVAL HISTORY: Molly Ross 77 y.o. female returns for urgent evaluation of a left breast erythema and warmth that started on Saturday.  She has h/o recurrent episodes of bronchitis and regularly sees pulmonology.  She saw Dr. Golden Pop PA yesterday and was started on Doxycycline and Prednisone taper to cover both the bronchitis and the presumed left breast cellulitis.  [name] states the erythema is slightly improved today.  She notes that her breast is slightly swollen and tender.  She denies any fevers, chills, shortness of breath, or any other concerns today.  A detailed Ros was otherwise non contributory.     Patient Active Problem List   Diagnosis Date Noted  . Cellulitis of left breast 10/01/2018  . Pleuritic pain 10/01/2018  . Genetic testing 03/27/2018  . Family history of ovarian cancer   . Family history of lung cancer   . Family history of prostate cancer   . Malignant neoplasm of lower-inner quadrant of left breast in female, estrogen receptor positive ([place]) 03/06/2018  . Osteopenia 02/18/2018  . COPD, moderate ([place]) 11/06/2015  . CAD (coronary artery disease), native coronary artery   . Bronchiectasis with (acute) exacerbation ([place]) 06/09/2015  . History of smoking 25-50 pack years 06/09/2015  . Atherosclerosis of aorta (  [place]) 02/02/2015  . Hiatal hernia 02/02/2015  . Emphysema of lung ([place]) 02/02/2015  . Depressive disorder, not elsewhere classified 12/04/2011  . Vitamin D deficiency 02/14/2011    has No Known Allergies.  MEDICAL HISTORY: Past Medical History:  Diagnosis Date  . Bronchiectasis ([place])   . Cancer ([place])   . Colon polyp   .  Dyspnea   . Family history of lung cancer   . Family history of ovarian cancer   . Family history of prostate cancer   . GERD (gastroesophageal reflux disease)   . History of radiation therapy 04/16/18- 05/14/18   42.56 Gy directed to the Left Breast in 16 fractions followed by a boost of 8 Gy in 4 fractions  . Shingles 1/09 and 07/2010  . Unspecified vitamin D deficiency 02/2011 and 2016    SURGICAL HISTORY: Past Surgical History:  Procedure Laterality Date  . BREAST LUMPECTOMY WITH RADIOACTIVE SEED AND SENTINEL LYMPH NODE BIOPSY Left 03/19/2018   Procedure: BREAST LUMPECTOMY WITH RADIOACTIVE SEED AND SENTINEL LYMPH NODE BIOPSY;  Surgeon: Stark Klein, MD;  Location: [facility];  Service: General;  Laterality: Left;  . CATARACT EXTRACTION, BILATERAL Bilateral 03/2017, 04/2017   [facility], Dr. Alanda Slim  . SHOULDER SURGERY  03/2011   for frozen shoulder, and cyst removal (Dr. Percell Miller)  . TONSILLECTOMY  child  . VIDEO BRONCHOSCOPY Bilateral 06/21/2016   Procedure: VIDEO BRONCHOSCOPY WITHOUT FLUORO;  Surgeon: Brand Males, MD;  Location: [facility];  Service: Cardiopulmonary;  Laterality: Bilateral;    SOCIAL HISTORY: Social History   Socioeconomic History  . Marital status: Widowed    Spouse name: Not on file  . Number of children: Not on file  . Years of education: Not on file  . Highest education level: Not on file  Occupational History  . Occupation: [employer]  Social Needs  . Financial resource strain: Not on file  . Food insecurity:    Worry: Not on file    Inability: Not on file  . Transportation needs:    Medical: Not on file    Non-medical: Not on file  Tobacco Use  . Smoking status: Former Smoker    Packs/day: 2.00    Years: 24.00    Pack years: 48.00    Types: Cigarettes    Last attempt to quit: 06/28/1980    Years since quitting: 38.2  . Smokeless tobacco: Never Used  . Tobacco comment: smoked 1-2 PPD x 24 years;  Substance and Sexual  Activity  . Alcohol use: Yes    Alcohol/week: 0.0 standard drinks    Comment: 1 glass of wine once a week (0-3)  . Drug use: No  . Sexual activity: Not Currently  Lifestyle  . Physical activity:    Days per week: Not on file    Minutes per session: Not on file  . Stress: Not on file  Relationships  . Social connections:    Talks on phone: Not on file    Gets together: Not on file    Attends religious service: Not on file    Active member of club or organization: Not on file    Attends meetings of clubs or organizations: Not on file    Relationship status: Not on file  . Intimate partner violence:    Fear of current or ex partner: Not on file    Emotionally abused: Not on file    Physically abused: Not on file    Forced sexual activity: Not on  file  Other Topics Concern  . Not on file  Social History Narrative   Lives with [name] (her sister).  Widowed (2011).  Son lives in [place], granddaughter lives in [place] (with her mother--gets to see her when she volunteers at [employer] on Saturdays).   Also has step grandson.   She works at the [employer] in [place].   Decreased to 3d/week in 08/2017.    FAMILY HISTORY: Family History  Problem Relation Age of Onset  . Stroke Mother   . Diabetes Mother   . Stroke Sister 18       brainstem stroke  . Diabetes Sister   . Seizures Sister   . Lung cancer Father 69  . Ovarian cancer Sister 61       metastatic to lung, liver  . Cancer Sister   . Ovarian cancer Maternal Aunt 95  . Heart disease Maternal Grandmother   . Prostate cancer Cousin   . Breast cancer Neg Hx     Review of Systems  Constitutional: Negative for appetite change, chills, fatigue, fever and unexpected weight change.  HENT:   Negative for hearing loss, lump/mass and trouble swallowing.   Eyes: Negative for eye problems and icterus.  Respiratory: Negative for chest tightness, cough and shortness of breath.   Cardiovascular: Negative for  chest pain, leg swelling and palpitations.  Gastrointestinal: Negative for abdominal distention, abdominal pain, constipation, diarrhea, nausea and vomiting.  Endocrine: Negative for hot flashes.  Skin: Positive for rash (see interval history). Negative for itching.  Neurological: Negative for dizziness, extremity weakness, headaches and numbness.  Hematological: Negative for adenopathy. Does not bruise/bleed easily.  Psychiatric/Behavioral: Negative for depression. The patient is not nervous/anxious.       PHYSICAL EXAMINATION  ECOG PERFORMANCE STATUS: 1 - Symptomatic but completely ambulatory  Vitals:   10/01/18 1412  BP: (!) 148/56  Pulse: 85  Resp: 18  Temp: 97.7 F (36.5 C)  SpO2: 100%    Physical Exam  Constitutional: She is oriented to person, place, and time. She appears well-developed and well-nourished.  HENT:  Head: Normocephalic and atraumatic.  Mouth/Throat: Oropharynx is clear and moist. No oropharyngeal exudate.  Eyes: Pupils are equal, round, and reactive to light. No scleral icterus.  Neck: Neck supple.  Cardiovascular: Normal rate, regular rhythm and normal heart sounds.  Pulmonary/Chest: Effort normal and breath sounds normal.  Left breast with erythema, slight swelling, and minimal warmth  Abdominal: Soft. Bowel sounds are normal. She exhibits no distension. There is no tenderness.  Musculoskeletal: She exhibits no edema.  Lymphadenopathy:    She has no cervical adenopathy.  Neurological: She is alert and oriented to person, place, and time.  Skin: Skin is warm and dry.  Psychiatric: She has a normal mood and affect.    LABORATORY DATA:  CBC    Component Value Date/Time   WBC 10.1 09/30/2018 1657   RBC 4.26 09/30/2018 1657   HGB 12.3 09/30/2018 1657   HGB 12.3 08/19/2018 1142   HCT 37.7 09/30/2018 1657   HCT 37.1 08/19/2018 1142   PLT 317.0 09/30/2018 1657   PLT 311 08/19/2018 1142   MCV 88.4 09/30/2018 1657   MCV 87 08/19/2018 1142   MCH  28.8 08/19/2018 1142   MCH 29.4 03/11/2018 0848   MCHC 32.5 09/30/2018 1657   RDW 14.0 09/30/2018 1657   RDW 12.0 (L) 08/19/2018 1142   LYMPHSABS 1.2 09/30/2018 1657   LYMPHSABS 1.3 08/19/2018 1142   MONOABS 0.8 09/30/2018 1657  EOSABS 0.1 09/30/2018 1657   EOSABS 0.1 08/19/2018 1142   BASOSABS 0.0 09/30/2018 1657   BASOSABS 0.1 08/19/2018 1142    CMP     Component Value Date/Time   NA 141 09/30/2018 1657   NA 142 08/19/2018 1142   K 3.8 09/30/2018 1657   CL 102 09/30/2018 1657   CO2 33 (H) 09/30/2018 1657   GLUCOSE 111 (H) 09/30/2018 1657   BUN 17 09/30/2018 1657   BUN 22 08/19/2018 1142   CREATININE 0.57 09/30/2018 1657   CREATININE 0.69 03/11/2018 0848   CREATININE 0.56 (L) 09/01/2017 1307   CALCIUM 10.4 09/30/2018 1657   PROT 7.3 08/19/2018 1142   ALBUMIN 4.3 08/19/2018 1142   AST 19 08/19/2018 1142   AST 18 03/11/2018 0848   ALT 10 08/19/2018 1142   ALT 11 03/11/2018 0848   ALKPHOS 84 08/19/2018 1142   BILITOT 0.8 08/19/2018 1142   BILITOT 1.0 03/11/2018 0848   GFRNONAA 89 08/19/2018 1142   GFRNONAA >60 03/11/2018 0848   GFRAA 103 08/19/2018 1142   GFRAA >60 03/11/2018 0848       PENDING LABS:   RADIOGRAPHIC STUDIES:  Dg Chest 2 View  Result Date: 10/01/2018 CLINICAL DATA:  Cough, left lung pain for 4 days. EXAM: CHEST - 2 VIEW COMPARISON:  07/27/2018 FINDINGS: There is hyperinflation of the lungs compatible with COPD. Chronic reticulonodular interstitial prominence throughout the lungs and bronchiectasis. No acute confluent airspace opacity or effusion. No acute bony abnormality. IMPRESSION: COPD/chronic bronchiectatic changes.  No acute findings. Electronically Signed   By: Rolm Baptise M.D.   On: 10/01/2018 09:03          ASSESSMENT and THERAPY PLAN:   Malignant neoplasm of lower-inner quadrant of left breast in female, estrogen receptor positive ([place]) 03/19/18: Left Lumpectomy: IDC Grade 1, 1.3 cm, 1/3 LN Positive, with LVI, T1cN1 ER 100%, PR  90%, Ki-67 5%, HER-2 negative Stage 1B Adjuvant radiation therapy 04/16/2018 to 05/14/2018  Treatment plan: letrozole 2.5 mg daily times 5-7 years  Ornella is doing well.  She is tolerating the letrozole well.  She did miss her SCP visit and I noted she needed to reschedule this today.   The pictures above don't give her skin change justice.  She has a faint erythematous rash over her left breast and into left chest wall, this does appear to be similar to radiation fields.  The skin is slightly darker in hue than the rest of the skin.  She was prescribed Doxycyline by her pulmonologist in addition to prednisone taper.  She will take this.  I will forward my note to Dr. Isidore Moos for review and any necessary recommendations.    Jilda will return in 4-8 weeks for SCP visit.           All questions were answered. The patient knows to call the clinic with any problems, questions or concerns. We can certainly see the patient much sooner if necessary.  A total of (20) minutes of face-to-face time was spent with this patient with greater than 50% of that time in counseling and care-coordination.  This note was electronically signed. Scot Dock, NP 10/01/2018

## 2018-10-01 NOTE — Progress Notes (Signed)
Left breast [phone]

## 2018-10-01 NOTE — Assessment & Plan Note (Signed)
03/19/18: Left Lumpectomy: IDC Grade 1, 1.3 cm, 1/3 LN Positive, with LVI, T1cN1 ER 100%, PR 90%, Ki-67 5%, HER-2 negative Stage 1B Adjuvant radiation therapy 04/16/2018 to 05/14/2018  Treatment plan: letrozole 2.5 mg daily times 5-7 years  Molly Ross is doing well.  She is tolerating the letrozole well.  She did miss her SCP visit and I noted she needed to reschedule this today.   The pictures above don't give her skin change justice.  She has a faint erythematous rash over her left breast and into left chest wall, this does appear to be similar to radiation fields.  The skin is slightly darker in hue than the rest of the skin.  She was prescribed Doxycyline by her pulmonologist in addition to prednisone taper.  She will take this.  I will forward my note to Dr. Isidore Moos for review and any necessary recommendations.    Molly Ross will return in 4-8 weeks for SCP visit.

## 2018-10-01 NOTE — Telephone Encounter (Signed)
Spoke with pt and relayed CXR and labs were good.  Informed her to pick up scripts of doxy and presnisone at pharmacy and do warm compresses to area.  She already had a call out to her PCP about possible cellulitis.  Nothing further needed at this time.

## 2018-10-01 NOTE — Assessment & Plan Note (Signed)
-   Cough with green mucus - No improvement with Levaquin - Change to doxycyline and start prednisone taper - CXR showed COPD/chronic bronchiectatic changes. No acute findings.

## 2018-10-01 NOTE — Telephone Encounter (Signed)
Gave avs and calendar ° °

## 2018-10-07 ENCOUNTER — Ambulatory Visit: Payer: Medicare Other | Admitting: Nurse Practitioner

## 2018-10-07 ENCOUNTER — Telehealth: Payer: Self-pay | Admitting: Nurse Practitioner

## 2018-10-07 ENCOUNTER — Encounter: Payer: Self-pay | Admitting: Nurse Practitioner

## 2018-10-07 ENCOUNTER — Telehealth: Payer: Self-pay | Admitting: Internal Medicine

## 2018-10-07 DIAGNOSIS — J471 Bronchiectasis with (acute) exacerbation: Secondary | ICD-10-CM | POA: Diagnosis not present

## 2018-10-07 MED ORDER — DOXYCYCLINE HYCLATE 100 MG PO TABS: 100.0000 mg | ORAL_TABLET | Freq: Two times a day (BID) | ORAL | 0 refills | Status: AC

## 2018-10-07 NOTE — Patient Instructions (Addendum)
Will extend doxycycline for 3 days to make 10 day course Will give mucinex samples Continue to use flutter valve Cellulitis of left breast resolving Follow up with Dr. Chase Caller in 2 weeks or at first available appointment or sooner if needed

## 2018-10-07 NOTE — Telephone Encounter (Signed)
Called and spoke with patient, scheduled her for an appointment. Patient is still having symptoms of pain. Nothing further needed.

## 2018-10-07 NOTE — Progress Notes (Signed)
@Patient  ID: Molly Ross, female    DOB: 08/01/1941, 77 y.o.   MRN: [id number]  Chief Complaint  Patient presents with  . Follow-up    Referring provider: Rita Ohara, MD  HPI 77 year old female, former smoker. PMH bronchiectasis, COPD, left breast cancer. Patient of Dr. Chase Caller. Maintained on Breo 100.   Tests: CXR 09/30/18 - COPD/chronic bronchiectatic changes.  No acute findings.  PFT Results Latest Ref Rng & Units 10/30/2015  FVC-Pre L 2.13  FVC-Predicted Pre % 61  FVC-Post L 2.26  FVC-Predicted Post % 64  Pre FEV1/FVC % % 60  Post FEV1/FCV % % 60  FEV1-Pre L 1.27  FEV1-Predicted Pre % 48  DLCO UNC% % 51  DLCO COR %Predicted % 83  TLC L 5.24  TLC % Predicted % 90  RV % Predicted % 118    OV 10/07/18 - follow up bronchiectasis/cellulitis left breast Patient presents today for a follow up of left breast cellulitis and bronchiectasis. She was seen by [facility] on 09/30/18 and was found to have have left breast cellulitis. She was treated with 7 days of doxycyline. Patient states that she feels much improved. Cellulitis is almost completely resolved. She is still experiencing some deep lung pain that she has when having a flare with bronchiectasis. States that it moves from side to side. Denies any fever, chest pain, or edema.     No Known Allergies  Immunization History  Administered Date(s) Administered  . Influenza, High Dose Seasonal PF 01/26/2014, 10/19/2014, 08/30/2015, 08/08/2016, 08/05/2017, 09/02/2018  . Pneumococcal Conjugate-13 12/25/2015  . Pneumococcal Polysaccharide-23 12/30/2016  . Tdap 09/03/2017    Past Medical History:  Diagnosis Date  . Bronchiectasis ([place])   . Cancer ([place])   . Colon polyp   . Dyspnea   . Family history of lung cancer   . Family history of ovarian cancer   . Family history of prostate cancer   . GERD (gastroesophageal reflux disease)   . History of radiation therapy 04/16/18- 05/14/18   42.56 Gy directed to the Left Breast  in 16 fractions followed by a boost of 8 Gy in 4 fractions  . Shingles 1/09 and 07/2010  . Unspecified vitamin D deficiency 02/2011 and 2016    Tobacco History: Social History   Tobacco Use  Smoking Status Former Smoker  . Packs/day: 2.00  . Years: 24.00  . Pack years: 48.00  . Types: Cigarettes  . Last attempt to quit: 06/28/1980  . Years since quitting: 38.3  Smokeless Tobacco Never Used  Tobacco Comment   smoked 1-2 PPD x 24 years;   Counseling given: Yes Comment: smoked 1-2 PPD x 24 years;   Outpatient Encounter Medications as of 10/07/2018  Medication Sig  . Ascorbic Acid (VITAMIN C) 100 MG tablet Take 1 tablet (100 mg total) by mouth daily.  Marland Kitchen aspirin EC 81 MG tablet Take 81 mg by mouth daily.  Marland Kitchen atorvastatin (LIPITOR) 20 MG tablet Take 1 tablet (20 mg total) by mouth daily.  . Calcium-Magnesium-Vitamin D (CALCIUM 500 PO) Take 1 tablet by mouth daily.  Marland Kitchen doxycycline (VIBRA-TABS) 100 MG tablet Take 1 tablet (100 mg total) by mouth 2 (two) times daily.  . fish oil-omega-3 fatty acids 1000 MG capsule Take 3 g by mouth daily.   . fluticasone furoate-vilanterol (BREO ELLIPTA) 100-25 MCG/INH AEPB Inhale 1 puff into the lungs daily.  Marland Kitchen letrozole (FEMARA) 2.5 MG tablet Take 1 tablet (2.5 mg total) by mouth daily.  . Multiple Vitamins-Minerals (  MULTIVITAMIN WITH MINERALS) tablet Take 1 tablet by mouth daily.  . traMADol (ULTRAM) 50 MG tablet Take 1 tablet (50 mg total) by mouth every 6 (six) hours as needed.  . doxycycline (VIBRA-TABS) 100 MG tablet Take 1 tablet (100 mg total) by mouth 2 (two) times daily for 3 days.  . [DISCONTINUED] predniSONE (DELTASONE) 10 MG tablet Take 2 tabs x 5 days (Patient not taking: Reported on 10/07/2018)   No facility-administered encounter medications on file as of 10/07/2018.      Review of Systems  Review of Systems  Constitutional: Negative.  Negative for chills and fever.  HENT: Negative.   Respiratory: Negative for cough and wheezing.     Cardiovascular: Negative.  Negative for chest pain, palpitations and leg swelling.  Gastrointestinal: Negative.   Skin: Negative for color change and rash.  Allergic/Immunologic: Negative.   Neurological: Negative.   Psychiatric/Behavioral: Negative.        Physical Exam  BP 126/74 (BP Location: Right Arm, Patient Position: Sitting, Cuff Size: Normal)   Pulse 82   Ht 5\' 9"  (1.753 m)   Wt 142 lb 9.6 oz (64.7 kg)   SpO2 96%   BMI 21.06 kg/m   Wt Readings from Last 5 Encounters:  10/07/18 142 lb 9.6 oz (64.7 kg)  10/01/18 145 lb 6.4 oz (66 kg)  09/30/18 144 lb (65.3 kg)  09/02/18 143 lb 9.6 oz (65.1 kg)  08/19/18 143 lb (64.9 kg)     Physical Exam  Constitutional: She is oriented to person, place, and time. She appears well-developed and well-nourished. No distress.  Cardiovascular: Normal rate and regular rhythm.  Pulmonary/Chest: Effort normal and breath sounds normal. No respiratory distress. She has no wheezes.  Musculoskeletal: She exhibits no edema.  Neurological: She is alert and oriented to person, place, and time.  Skin:  No redness to breast noted  Psychiatric: She has a normal mood and affect.  Nursing note and vitals reviewed.   Imaging: Dg Chest 2 View  Result Date: 10/01/2018 CLINICAL DATA:  Cough, left lung pain for 4 days. EXAM: CHEST - 2 VIEW COMPARISON:  07/27/2018 FINDINGS: There is hyperinflation of the lungs compatible with COPD. Chronic reticulonodular interstitial prominence throughout the lungs and bronchiectasis. No acute confluent airspace opacity or effusion. No acute bony abnormality. IMPRESSION: COPD/chronic bronchiectatic changes.  No acute findings. Electronically Signed   By: Rolm Baptise M.D.   On: 10/01/2018 09:03     Assessment & Plan:   Bronchiectasis with (acute) exacerbation (HCC) Slow to resolve  Patient Instructions  Will extend doxycycline for 3 days to make 10 day course Will give mucinex samples Continue to use flutter  valve Cellulitis of left breast resolving Follow up with Dr. Chase Caller in 2 weeks or at first available appointment or sooner if needed       Fenton Foy, NP 10/07/2018

## 2018-10-07 NOTE — Assessment & Plan Note (Signed)
Slow to resolve  Patient Instructions  Will extend doxycycline for 3 days to make 10 day course Will give mucinex samples Continue to use flutter valve Cellulitis of left breast resolving Follow up with Dr. Chase Caller in 2 weeks or at first available appointment or sooner if needed

## 2018-10-08 NOTE — Telephone Encounter (Signed)
error 

## 2018-10-09 ENCOUNTER — Telehealth: Payer: Self-pay | Admitting: Nurse Practitioner

## 2018-10-09 MED ORDER — LEVOFLOXACIN 500 MG PO TABS: 500.0000 mg | ORAL_TABLET | Freq: Every day | ORAL | 0 refills | Status: DC

## 2018-10-09 NOTE — Telephone Encounter (Signed)
Spoke with pt, she states the doxycycline is not working and she is still having pain in the left lung. She states the doxycycline took care of the cellulitis but not the lung. She is having pain with every breath and thinks she needs Levaquin instead. She request that Dr. Chase Caller reviews her chart to respond to this message. MR please advise.   [facility] [address]  Instructions      Return in about 2 weeks (around 10/21/2018) for follow up Dr. Chase Caller.  Will extend doxycycline for 3 days to make 10 day course Will give mucinex samples Continue to use flutter valve Cellulitis of left breast resolving Follow up with Dr. Chase Caller in 2 weeks or at first available appointment or sooner if needed       After Visit Summary (Printed 10/07/2018)

## 2018-10-09 NOTE — Telephone Encounter (Signed)
Ok to do  take levaquin 500mg  once daily  X 6 days but if she has any tendon pain she should stop abx and call us     SIGNATURE    Dr. Brand Males, M.D., F.C.C.P,  Pulmonary and Critical Care Medicine Staff Physician, [place] Director - Interstitial Lung Disease  Program  Pulmonary [facility] [place], [place], [address]  Pager: [phone], If no answer or between  15:00h - 7:00h: call [phone] Telephone: [phone]  10:43 AM 10/09/2018

## 2018-10-25 ENCOUNTER — Other Ambulatory Visit: Payer: Self-pay | Admitting: Family Medicine

## 2018-10-25 DIAGNOSIS — I700 Atherosclerosis of aorta: Secondary | ICD-10-CM

## 2018-11-03 ENCOUNTER — Encounter: Payer: Self-pay | Admitting: Adult Health

## 2018-11-03 ENCOUNTER — Inpatient Hospital Stay: Payer: Medicare Other | Attending: Hematology and Oncology | Admitting: Adult Health

## 2018-11-03 ENCOUNTER — Telehealth: Payer: Self-pay | Admitting: Hematology and Oncology

## 2018-11-03 VITALS — BP 134/64 | HR 81 | Temp 98.3°F | Resp 18 | Ht 69.0 in | Wt 142.8 lb

## 2018-11-03 DIAGNOSIS — Z87891 Personal history of nicotine dependence: Secondary | ICD-10-CM | POA: Insufficient documentation

## 2018-11-03 DIAGNOSIS — C50312 Malignant neoplasm of lower-inner quadrant of left female breast: Secondary | ICD-10-CM | POA: Insufficient documentation

## 2018-11-03 DIAGNOSIS — Z801 Family history of malignant neoplasm of trachea, bronchus and lung: Secondary | ICD-10-CM | POA: Insufficient documentation

## 2018-11-03 DIAGNOSIS — Z79811 Long term (current) use of aromatase inhibitors: Secondary | ICD-10-CM | POA: Diagnosis not present

## 2018-11-03 DIAGNOSIS — Z8041 Family history of malignant neoplasm of ovary: Secondary | ICD-10-CM | POA: Insufficient documentation

## 2018-11-03 DIAGNOSIS — Z170 Estrogen receptor positive status [ER+]: Secondary | ICD-10-CM | POA: Diagnosis not present

## 2018-11-03 NOTE — Telephone Encounter (Signed)
Gave avs and calendar patient requested Tuesday

## 2018-11-03 NOTE — Progress Notes (Signed)
CLINIC:  Survivorship   REASON FOR VISIT:  Routine follow-up post-treatment for a recent history of breast cancer.  BRIEF ONCOLOGIC HISTORY:    Malignant neoplasm of lower-inner quadrant of left breast in female, estrogen receptor positive ([place])   03/06/2018 Initial Diagnosis    Screening detected left breast mass at 8 o'clock position 1.2 cm in size lower inner quadrant axilla is negative biopsy revealed grade 2 invasive ductal carcinoma ER 100%, PR 90%, Ki-67 5%, HER-2 negative, T1 be N0 stage I a clinical stage    03/19/2018 Surgery    Left Lumpectomy: IDC Grade 1, 1.3 cm, 1/3 LN Positive, with LVI, T1cN1 ER 100%, PR 90%, Ki-67 5%, HER-2 negative Stage 1B    03/21/2018 Genetic Testing    The Common Hereditary Cancer Panel offered by [facility] includes sequencing and/or deletion duplication testing of the following 47 genes: APC, ATM, AXIN2, BARD1, BMPR1A, BRCA1, BRCA2, BRIP1, CDH1, CDKN2A (p14ARF), CDKN2A (p16INK4a), CKD4, CHEK2, CTNNA1, DICER1, EPCAM (Deletion/duplication testing only), GREM1 (promoter region deletion/duplication testing only), KIT, MEN1, MLH1, MSH2, MSH3, MSH6, MUTYH, NBN, NF1, NHTL1, PALB2, PDGFRA, PMS2, POLD1, POLE, PTEN, RAD50, RAD51C, RAD51D, SDHB, SDHC, SDHD, SMAD4, SMARCA4. STK11, TP53, TSC1, TSC2, and VHL.  The following genes were evaluated for sequence changes only: SDHA and HOXB13 c.251G>A variant only.  Results: No pathogenic variants identified.   A variant of uncertain significance in the gene SMARCA4 was identified c.5011C>T ([id number]).  The date of this test report is 03/21/2018    04/10/2018 Cancer Staging    Staging form: Breast, AJCC 8th Edition - Pathologic: Stage IA (pT1c, pN1a, cM0, G1, ER+, PR+, HER2-) - Signed by Eppie Gibson, MD on 04/10/2018    04/16/2018 - 05/14/2018 Radiation Therapy    Adjuvant radiation therapy    05/2018 -  Anti-estrogen oral therapy    Letrozole daily     INTERVAL HISTORY:  Ms. Zellman presents to the [facility]  today for our initial meeting to review her survivorship care plan detailing her treatment course for breast cancer, as well as monitoring long-term side effects of that treatment, education regarding health maintenance, screening, and overall wellness and health promotion.     Overall, Ms. Fodge reports feeling quite well.  She is taking Letrozole daily and has no issues with hot flashes, arthralgias, or vaginal dryness.  She is tolerating it well.      REVIEW OF SYSTEMS:  Review of Systems  Constitutional: Negative for appetite change, chills, fatigue, fever and unexpected weight change.  HENT:   Negative for hearing loss, lump/mass and trouble swallowing.   Eyes: Negative for eye problems and icterus.  Respiratory: Negative for chest tightness, cough and shortness of breath.   Cardiovascular: Negative for chest pain, leg swelling and palpitations.  Gastrointestinal: Negative for abdominal distention, abdominal pain, constipation, diarrhea, nausea and vomiting.  Endocrine: Negative for hot flashes.  Genitourinary: Negative for difficulty urinating.   Musculoskeletal: Negative for arthralgias.  Skin: Negative for itching and rash.  Neurological: Negative for dizziness, extremity weakness and numbness.  Hematological: Negative for adenopathy. Does not bruise/bleed easily.  Psychiatric/Behavioral: Negative for depression. The patient is not nervous/anxious.   Breast: Denies any new nodularity, masses, tenderness, nipple changes, or nipple discharge.      ONCOLOGY TREATMENT TEAM:  1. Surgeon:  Dr. Barry Dienes at [facility] 2. Medical Oncologist: Dr. Lindi Adie  3. Radiation Oncologist: Dr. Isidore Moos    PAST MEDICAL/SURGICAL HISTORY:  Past Medical History:  Diagnosis Date  . Bronchiectasis ([place])   .  Cancer ([place])   . Colon polyp   . Dyspnea   . Family history of lung cancer   . Family history of ovarian cancer   . Family history of prostate cancer   . GERD (gastroesophageal  reflux disease)   . History of radiation therapy 04/16/18- 05/14/18   42.56 Gy directed to the Left Breast in 16 fractions followed by a boost of 8 Gy in 4 fractions  . Shingles 1/09 and 07/2010  . Unspecified vitamin D deficiency 02/2011 and 2016   Past Surgical History:  Procedure Laterality Date  . BREAST LUMPECTOMY WITH RADIOACTIVE SEED AND SENTINEL LYMPH NODE BIOPSY Left 03/19/2018   Procedure: BREAST LUMPECTOMY WITH RADIOACTIVE SEED AND SENTINEL LYMPH NODE BIOPSY;  Surgeon: Stark Klein, MD;  Location: [facility];  Service: General;  Laterality: Left;  . CATARACT EXTRACTION, BILATERAL Bilateral 03/2017, 04/2017   [facility], Dr. Alanda Slim  . SHOULDER SURGERY  03/2011   for frozen shoulder, and cyst removal (Dr. Percell Miller)  . TONSILLECTOMY  child  . VIDEO BRONCHOSCOPY Bilateral 06/21/2016   Procedure: VIDEO BRONCHOSCOPY WITHOUT FLUORO;  Surgeon: Brand Males, MD;  Location: [facility];  Service: Cardiopulmonary;  Laterality: Bilateral;     ALLERGIES:  No Known Allergies   CURRENT MEDICATIONS:  Outpatient Encounter Medications as of 11/03/2018  Medication Sig  . Ascorbic Acid (VITAMIN C) 100 MG tablet Take 1 tablet (100 mg total) by mouth daily.  Marland Kitchen aspirin EC 81 MG tablet Take 81 mg by mouth daily.  Marland Kitchen atorvastatin (LIPITOR) 20 MG tablet TAKE 1 TABLET BY MOUTH EVERY DAY  . Calcium-Magnesium-Vitamin D (CALCIUM 500 PO) Take 1 tablet by mouth daily.  . fish oil-omega-3 fatty acids 1000 MG capsule Take 3 g by mouth daily.   . fluticasone furoate-vilanterol (BREO ELLIPTA) 100-25 MCG/INH AEPB Inhale 1 puff into the lungs daily.  Marland Kitchen letrozole (FEMARA) 2.5 MG tablet Take 1 tablet (2.5 mg total) by mouth daily.  . Multiple Vitamins-Minerals (MULTIVITAMIN WITH MINERALS) tablet Take 1 tablet by mouth daily.  . [DISCONTINUED] doxycycline (VIBRA-TABS) 100 MG tablet Take 1 tablet (100 mg total) by mouth 2 (two) times daily. (Patient not taking: Reported on 11/03/2018)  . [DISCONTINUED]  levofloxacin (LEVAQUIN) 500 MG tablet Take 1 tablet (500 mg total) by mouth daily. (Patient not taking: Reported on 11/03/2018)  . [DISCONTINUED] traMADol (ULTRAM) 50 MG tablet Take 1 tablet (50 mg total) by mouth every 6 (six) hours as needed. (Patient not taking: Reported on 11/03/2018)   No facility-administered encounter medications on file as of 11/03/2018.      ONCOLOGIC FAMILY HISTORY:  Family History  Problem Relation Age of Onset  . Stroke Mother   . Diabetes Mother   . Stroke Sister 55       brainstem stroke  . Diabetes Sister   . Seizures Sister   . Lung cancer Father 76  . Ovarian cancer Sister 60       metastatic to lung, liver  . Cancer Sister   . Ovarian cancer Maternal Aunt 95  . Heart disease Maternal Grandmother   . Prostate cancer Cousin   . Breast cancer Neg Hx      GENETIC COUNSELING/TESTING: See above  SOCIAL HISTORY:  Social History   Socioeconomic History  . Marital status: Widowed    Spouse name: Not on file  . Number of children: Not on file  . Years of education: Not on file  . Highest education level: Not on file  Occupational History  .  Occupation: [employer]  Social Needs  . Financial resource strain: Not on file  . Food insecurity:    Worry: Not on file    Inability: Not on file  . Transportation needs:    Medical: Not on file    Non-medical: Not on file  Tobacco Use  . Smoking status: Former Smoker    Packs/day: 2.00    Years: 24.00    Pack years: 48.00    Types: Cigarettes    Last attempt to quit: 06/28/1980    Years since quitting: 38.3  . Smokeless tobacco: Never Used  . Tobacco comment: smoked 1-2 PPD x 24 years;  Substance and Sexual Activity  . Alcohol use: Yes    Alcohol/week: 0.0 standard drinks    Comment: 1 glass of wine once a week (0-3)  . Drug use: No  . Sexual activity: Not Currently  Lifestyle  . Physical activity:    Days per week: Not on file    Minutes per session: Not on file  . Stress:  Not on file  Relationships  . Social connections:    Talks on phone: Not on file    Gets together: Not on file    Attends religious service: Not on file    Active member of club or organization: Not on file    Attends meetings of clubs or organizations: Not on file    Relationship status: Not on file  . Intimate partner violence:    Fear of current or ex partner: Not on file    Emotionally abused: Not on file    Physically abused: Not on file    Forced sexual activity: Not on file  Other Topics Concern  . Not on file  Social History Narrative   Lives with [name] (her sister).  Widowed (2011).  Son lives in [place], granddaughter lives in [place] (with her mother--gets to see her when she volunteers at [employer] on Saturdays).   Also has step grandson.   She works at the [employer] in [place].   Decreased to 3d/week in 08/2017.  '   PHYSICAL EXAMINATION:  Vital Signs:   Vitals:   11/03/18 0952  BP: 134/64  Pulse: 81  Resp: 18  Temp: 98.3 F (36.8 C)  SpO2: 96%   Filed Weights   11/03/18 0952  Weight: 142 lb 12.8 oz (64.8 kg)   General: Well-nourished, well-appearing female in no acute distress.  She is unaccompanied today.   HEENT: Head is normocephalic.  Pupils equal and reactive to light. Conjunctivae clear without exudate.  Sclerae anicteric. Oral mucosa is pink, moist.  Oropharynx is pink without lesions or erythema.  Lymph: No cervical, supraclavicular, or infraclavicular lymphadenopathy noted on palpation.  Cardiovascular: Regular rate and rhythm.Marland Kitchen Respiratory: Clear to auscultation bilaterally. Chest expansion symmetric; breathing non-labored.  Breasts: left breast s/p lumpectomy and recent radiation, mild amount of scar tissue tissue noted, improved per patient, right breast benign GI: Abdomen soft and round; non-tender, non-distended. Bowel sounds normoactive.  GU: Deferred.  Neuro: No focal deficits. Steady gait.  Psych: Mood and affect  normal and appropriate for situation.  Extremities: No edema. MSK: No focal spinal tenderness to palpation.  Full range of motion in bilateral upper extremities Skin: Warm and dry.  LABORATORY DATA:  None for this visit.  DIAGNOSTIC IMAGING:  None for this visit.      ASSESSMENT AND PLAN:  Ms.. Bergeron is a pleasant 77 y.o. female with Stage IA left breast invasive  ductal carcinoma, ER+/PR+/HER2-, diagnosed in 02/2018, treated with lumpectomy, adjuvant radiation therapy, and anti-estrogen therapy with Letrozole beginning in 05/2018.  She presents to the Survivorship Clinic for our initial meeting and routine follow-up post-completion of treatment for breast cancer.    1. Stage IA left breast cancer:  Ms. Mcnew is continuing to recover from definitive treatment for breast cancer. She will follow-up with her medical oncologist, Dr. Lindi Adie in March or April after her mammogram. Today, a comprehensive survivorship care plan and treatment summary was reviewed with the patient today detailing her breast cancer diagnosis, treatment course, potential late/long-term effects of treatment, appropriate follow-up care with recommendations for the future, and patient education resources.  A copy of this summary, along with a letter will be sent to the patient's primary care provider via mail/fax/In Basket message after today's visit.    2. Bone health:  Given Ms. Ellenberger age/history of breast cancer and her current treatment regimen including anti-estrogen therapy with Letrozole, she is at risk for bone demineralization.  Her last DEXA scan was 02/18/2018 and showed osteopenia with a t score of -1.8 in the left femur.  She will be due for repeat in 02/2020.  In the meantime, she was encouraged to increase her consumption of foods rich in calcium, as well as increase her weight-bearing activities.  She was given education on specific activities to promote bone health.  3. Cancer screening:  Due to Ms. Goynes's history  and her age, she should receive screening for skin cancers, colon cancer, and gynecologic cancers.  The information and recommendations are listed on the patient's comprehensive care plan/treatment summary and were reviewed in detail with the patient.    4. Health maintenance and wellness promotion: Ms. Loja was encouraged to consume 5-7 servings of fruits and vegetables per day. We reviewed the "Nutrition Rainbow" handout, as well as the handout "Take Control of Your Health and Reduce Your Cancer Risk" from the [place].  She was also encouraged to engage in moderate to vigorous exercise for 30 minutes per day most days of the week. We discussed the [employer] fitness program, which is designed for cancer survivors to help them become more physically fit after cancer treatments.  She was instructed to limit her alcohol consumption and continue to abstain from tobacco use.     5. Support services/counseling: It is not uncommon for this period of the patient's cancer care trajectory to be one of many emotions and stressors.  We discussed an opportunity for her to participate in the next session of [facility] ("Finding Your New Normal") support group series designed for patients after they have completed treatment.   Ms. Voisin was encouraged to take advantage of our many other support services programs, support groups, and/or counseling in coping with her new life as a cancer survivor after completing anti-cancer treatment.  She was offered support today through active listening and expressive supportive counseling.  She was given information regarding our available services and encouraged to contact me with any questions or for help enrolling in any of our support group/programs.    Dispo:   -Return to cancer center in 02/2019 for f/u with Dr. Lindi Adie -Mammogram due in 02/2019 -Bone density 02/2020 -She is welcome to return back to the Survivorship Clinic at any time; no additional follow-up  needed at this time.  -Consider referral back to survivorship as a long-term survivor for continued surveillance  A total of (30) minutes of face-to-face time was spent with this patient  with greater than 50% of that time in counseling and care-coordination.   Gardenia Phlegm, NP Survivorship Program [facility] [phone]   Note: PRIMARY CARE PROVIDER Rita Ohara, [place] [phone]

## 2018-11-10 ENCOUNTER — Telehealth: Payer: Self-pay | Admitting: Internal Medicine

## 2018-11-10 MED ORDER — CEPHALEXIN 500 MG PO CAPS: 500.0000 mg | ORAL_CAPSULE | Freq: Three times a day (TID) | ORAL | 0 refills | Status: DC

## 2018-11-10 MED ORDER — PREDNISONE 10 MG PO TABS: ORAL_TABLET | ORAL | 0 refills | Status: DC

## 2018-11-10 NOTE — Telephone Encounter (Signed)
No Known Allergies   Plan  Take prednisone 40 mg daily x 2 days, then 20mg  daily x 2 days, then 10mg  daily x 2 days, then 5mg  daily x 2 days and stop   cephalexin 500mg  three times daily x  5 days

## 2018-11-10 NOTE — Telephone Encounter (Signed)
Patient is returning call, CB is [phone].

## 2018-11-10 NOTE — Telephone Encounter (Signed)
Spoke with pt. She is aware of MR's recommendations. Rxs have been sent in. Nothing further was needed. 

## 2018-11-10 NOTE — Telephone Encounter (Signed)
Left message for patient to call back  

## 2018-11-10 NOTE — Telephone Encounter (Signed)
Spoke with patient. She stated that she woke up this morning with a sore throat and productive cough. She is coughing up small amounts of green phlegm. Denies any fever. Increased SOB. She wants to have something called in for her.   She wishes to use [facility] on [address].   MR, please advise. Thanks!

## 2018-11-19 ENCOUNTER — Encounter: Payer: Self-pay | Admitting: Internal Medicine

## 2018-11-19 ENCOUNTER — Ambulatory Visit: Payer: Medicare Other | Admitting: Internal Medicine

## 2018-11-19 VITALS — BP 128/62 | HR 93 | Temp 98.3°F | Ht 69.0 in | Wt 140.6 lb

## 2018-11-19 DIAGNOSIS — J471 Bronchiectasis with (acute) exacerbation: Secondary | ICD-10-CM

## 2018-11-19 DIAGNOSIS — J441 Chronic obstructive pulmonary disease with (acute) exacerbation: Secondary | ICD-10-CM | POA: Diagnosis not present

## 2018-11-19 DIAGNOSIS — J449 Chronic obstructive pulmonary disease, unspecified: Secondary | ICD-10-CM

## 2018-11-19 MED ORDER — IPRATROPIUM-ALBUTEROL 0.5-2.5 (3) MG/3ML IN SOLN: 3.0000 mL | RESPIRATORY_TRACT | 2 refills | Status: DC | PRN

## 2018-11-19 MED ORDER — TIOTROPIUM BROMIDE MONOHYDRATE 1.25 MCG/ACT IN AERS: 2.0000 | INHALATION_SPRAY | Freq: Every day | RESPIRATORY_TRACT | 0 refills | Status: DC

## 2018-11-19 MED ORDER — SODIUM CHLORIDE 3 % IN NEBU: INHALATION_SOLUTION | RESPIRATORY_TRACT | 2 refills | Status: DC | PRN

## 2018-11-19 MED ORDER — METHYLPREDNISOLONE ACETATE 80 MG/ML IJ SUSP
80.0000 mg | Freq: Once | INTRAMUSCULAR | Status: AC
  Administered 2018-11-19: 80 mg via INTRAMUSCULAR

## 2018-11-19 MED ORDER — AZITHROMYCIN 250 MG PO TABS: ORAL_TABLET | ORAL | 0 refills | Status: DC

## 2018-11-19 MED ORDER — LEVALBUTEROL HCL 0.63 MG/3ML IN NEBU
0.6300 mg | INHALATION_SOLUTION | Freq: Once | RESPIRATORY_TRACT | Status: AC
  Administered 2018-11-19: 0.63 mg via RESPIRATORY_TRACT

## 2018-11-19 MED ORDER — PREDNISONE 10 MG PO TABS: ORAL_TABLET | ORAL | 0 refills | Status: DC

## 2018-11-19 NOTE — Addendum Note (Signed)
Addended by: Karmen Stabs on: 11/19/2018 04:39 PM   Modules accepted: Orders

## 2018-11-19 NOTE — Patient Instructions (Addendum)
ICD-10-CM   1. Bronchiectasis with (acute) exacerbation ([place]) J47.1   2. COPD, moderate ([place]) J44.9   3. COPD, frequent exacerbations ([place]) J44.1    #Acute exacerbation Do xopenex neb in office Do IM depot medrol 80mg  x 1 in office Give sputum gram stain and culture for bacteria Give sputum AFB smear and culture Please take Take prednisone 40mg  once daily x 3 days, then 30mg  once daily x 3 days, then 20mg  once daily x 3 days, then prednisone 10mg  once daily  x 3 days and stop Z pak Stop fish oil  Chronic management  - continue breo daily - start spiriva respimat daily - continue mucinex - start 3% saline neb 29mL once daily scheduled - use duoneb 1-2 times daily as needed - start smart vest daily  - future will consider daliresp - CT scan chest without contrast in 4 weeks  Followup - 4 weeks or sooner if needed; CAT score at followup - if worse go to ER

## 2018-11-19 NOTE — Addendum Note (Signed)
Addended by: Jannette Spanner on: 11/19/2018 05:03 PM   Modules accepted: Orders

## 2018-11-19 NOTE — Progress Notes (Signed)
IOV 06/09/2015  Chief Complaint  Patient presents with  . Pulmonary Consult    Pt referred by Dr. Tomi Bamberger for abnormal CT.    77 year old female somewhat of a poor historian. History is gained from repeatedly asking the same questions in many different ways and also review of Dr Rita Ohara notes in the EMR   She reports that she is a 40 pack smoker having quit 34 years ago. In the 1970s and 1980s she had recurrent bouts of pneumonia which left her with bronchiectasis. At baseline she's had some chronic cough of unclear severity and shortness of breath with exertion of unclear severity but probably mild. Unclear if she has associated sputum. Unclear what aggravating and relieving factors where or are. Then in for a 2016 she developed over several weeks worsening cough with green sputum and some fever. Apparently was diagnosed with pneumonia. This then resulted in a CT scan of the chest and her 2016 which I personally reviewed and shows bilateral bronchiectasis particularly on the right middle lobe. She also had a nodule. Antibody treatment she improved. Then in April 2016 was started on Brio which inhaler therapy help her cough and shortness of breath significantly. Currently these are not much much of an issue for her. She has a good quality of life with the Brio. The Brio is expensive. She had follow-up CT scan of the chest 04/26/2015 that shows resolution of the nodule but persistence of the bronchiectasis. In fact she seems to have chronic bronchiectasis dating back on several chest x-rays although it back to 2009. She had test that she's had bronchiectasis since the 1980s. Currently no sputum production not much of a cough or shortness of breath. She denies having had bronchoscopy for the same.  She is not interested in much testing  Noted spirometry pre-breo March 2016  - fev 1.14L/45%, R 56 - severe obstructive lung disease And post Breo in June 05 2015  - Fev 1.32L/52%, R 57 ->  significantly improved but still in moderate category   OV 11/06/2015  Chief Complaint  Patient presents with  . Follow-up    Pt here after PFT. Pt states she respiratory infection in October and was on 2 rounds of abx and is now back to her baseline. Pt c/o mild non prod cough. Pt denies CP/tightness.    Previous heavy smoker with right middle lobe chronic bronchiectasis with findings suggestive of MAI and moderate/severe obstructive lung disease with findings of emphysema on CT chest - February and May 2016  - Last seen in June 2016. At that time he started Brio. Overall she has been doing well. However in October 2016 she had a respiratory exacerbation. She was treated with 2 rounds of anabiotic's and prednisone course and then she fell baseline. She is followed up with a pulmonary function test  t 10/30/2015) baseline and well. 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 Currently she is feeling well. Review of vaccine shows she is up-to-date with flu shot. She thinks she has had the pneumonia vaccine with her primary care physician but she's not sure and she is going to check with her.  No new issues.  OV 02/12/2016  Chief Complaint  Patient presents with  . Follow-up    Pt reports having some increased breathing issues since last OV- treated a few times with abx/pred for infections. Most recent x 1  week ago- Zpak w/Pred taper. Pt notes some green mucus but cough is much improved.   Heavy smoker with right middle lobe chronic bronchiectasis and some lingular bronchiectasis and scattered infiltrates in the lower lobe based on CT February and May 2016. May 2016 with improvement   - This is a routine follow-up. Last seen November 2016. She is compliant with her inhalers. In the interim approximately on Valentine's Day 2017 had another flareup and he called in Z-Pak and prednisone. This has  helped. Cough is significantly worse but it has improved although not at baseline. She slowly getting better. This will be her third exacerbation. Review of the chart shows an visualization of the CT chest which I did myself and showed her shows findings described above. Chart review also shows that do not had a complete bronchiectasis hematological and etiological workup. She is open to having bronchoscopy   OV 04/23/2016  Chief Complaint  Patient presents with  . Follow-up    Pt here after CT chest. Pt states her breathing has improved since last OV. Pt states she is still coughing in the morning but now there is less mucus production.      Heavy smoker with right middle lobe chronic bronchiectasis and some lingular bronchiectasis and scattered infiltrates in the lower lobe based on CT February and May 2016. PFT Nov 2016 -> fev 1.36L/51%, R 60. DLCO 16/51%   In  Nov 2016 I added spiriva and now on triple inhaler Rx- says with this symptoms she is significantly better. Still with cough. Some spuitum +. Labs for etiology reviewed done feb 2017 -> IgG, IgA, IgM - normal. Allpha 1AT - normal. Autoimmune - ANA, CCP, dsDNA, RF, ENA, SSA, SSB, scl-70, RNP - negative.    OV 07/01/2016  Chief Complaint  Patient presents with  . Follow-up    Follow up bronchoscopy.  pt c/o R sided back pain X3 days, nonprod cough.      Idiopathic bronchiectasis. Here to review results. Underwent bronchoscopy with lavage 06/21/2016 that showed acute neutrophils 91%. Culture was negative. AFB smear is negative fungal smear was negative. AFB culture is pending. Bronchoscopy showed a lot of mucus. She says that when she lies on she has significant cough and mucus. She feels that after bronchoscopy she improved because of the removal of mucus but shortly after this for the last few days she's having right infrascapular pleuritic and off chest tightness that is consistent with previous exacerbation and she feels she needs  an antibiotic and prednisone course. She is compliant with Spiriva and Brio at this point. Overall even at baseline she admits to copious white mucus.   OV 09/16/2016  Chief Complaint  Patient presents with  . Follow-up    Pt states that she does have some lung congestion, little cough and wheezing with SOB. Pt states that she is trying to learn when to "rest" and not overdo herself. Needs refills Breo and Spiriva Resp.    Follow-up idiopathic bronchiectasis. Last visit mid July 2017. After that called in 09/04/2016 with symptoms of bronchiectasis exacerbation. Called in Z-Pak and prednisone. She is back to baseline. At this point in time she tells me that she does not have daily mucus other than mild amounts. She does admit that she is noncompliant with physician with chest physical therapy. She recollects that one time she position herself in an inversion table for back therapy and this cleared of the mucus and she felt much better. She admits that she  needs to do a better job with positional therapy. She is up-to-date with flu shot. She continues on Spiriva and Brio triple inhaler therapy. There are no new issues.   OV 04/24/2017  Chief Complaint  Patient presents with  . Acute Visit    Pt c/o hoarseness, prod cough with green mucus, chest tightness x 2 days. Pt denies f/c/s and increase in SOB.   Acute visit for this patient with idiopathic bronchiectasis.  December 2017 she saw Dr. Baltazar Apo with an acute exacerbation. After that she went back to baseline. She does not use chest physical therapy but she uses inhaler therapy only. Now for the last 2 days she's having increased cough with chest tightness and green mucous. Baseline is clear yellow mucus. Low-grade fever present in the office. No wheezing or increased shortness of breath. Significant hoarseness of voice present and some postnasal drip present. She feels she is in bronchiectasis exacerbation. There are no other issues. It is  noted that in July 2017 bronchoscopy did not reveal any organisms on lavage    03/17/2018 Follow up : Bronchiectasis /COPD  Returns for a follow-up for bronchiectasis/COPD .  Says that she has been doing well over the last year.  She had no increased cough or congestion.  No recent antibiotic use.  She denies any shortness of breath, hemoptysis, weight loss, edema.  She does have an occasional cough that is minimally productive.  She is very active and independent.  She remains on BREO daily .,  Does depend on samples.  Has not taken Spiriva in >1 year. Has not felt that she needed this.  Recently diagnosed left breast cancer. Plans for surgery later this week.    OV 09/02/2018  Subjective:  Patient ID: Molly Ross, female , DOB: [birth year] , age 39 y.o. , MRN: [id number] , ADDRESS: [place] [id number]   09/02/2018 -   Chief Complaint  Patient presents with  . Follow-up    Pt states she has been doing good since last visit except she does have complaints of fatigue and is coughing with occ green to yellow mucus. States cough is worse in the mornings.   77 year old female former smoker followed for COPD and Bronchiectasis. 10/30/2015) baseline and well. Post broncho-dilator FEV1 1.36 L/51% and a ratio 60, total lung capacity 5.2 L/90%, DLCO 16/51%. Last CT 2016 and May 2017 and CXR aug 2019 with chronic changes   HPI Molly Ross 77 y.o. - female with bronchiectasis maintained on Brio. I personally have not seen her since May 2018. Since then she has seen my colleagues for flareupsand gotten anabiotic's of prednisone. Most recently in August 2019. Chest x-ray that time showed just chronic changes. Since that time overall she's feeling well. She is stable. She has sputum production every few days. She uses a flutter valve as needed only. Sometimes of sputum is thick. But overall stable no change in color. Does not feel she is in a flareup.She continuesl and the  fluas needed. She is not on Mucinex. She wants to have the flu shot today.he does been a few years since she had pulmonary function test.   She takes care of her sister in a wheelchair. Another sister died from ovarian cancer.   ROS - per HPI   OV 11/19/2018  Subjective:  Patient ID: Molly Ross, female , DOB: 08-28-1941 , age 3 y.o. , MRN: [id number] , ADDRESS: [place] [id number]  11/19/2018 -   Chief Complaint  Patient presents with  . Follow-up    f/u bronchiectasis, still coughing up green mucus,      HPI Molly Ross 77 y.o. -follow bronchiectasis associated with COPD colonized with Haemophilus influenza as determined by bronchoscopy in June 2018.  Maintained on Breo and flutter valve  Last seen mid September 2019.  Since then she has had 2 exacerbations treated over the phone.  The first 1 before Halloween 2019 and the second 1 before Thanksgiving 2019.  She states that with the second 1 just before Thanksgiving 2019 she really has not recovered.  She says she feels extremely fatigued and miserable.  In fact her cough is significantly worse than baseline.  She is bringing up copious amount of green sputum daily.  She is also having chest tightness and wheezing.  Also short of breath.  There is no hemoptysis or fever or chills or orthopnea proximal nocturnal dyspnea.  Overall she feels miserable. Continues with breo and flutter valve COPD CAT score  She is not on saline nebulizer treatment or vibratory vest or Roflumilast or nebulizer or inhaled anticholinergic.   Patient has has daily productive cough lasting greater than 24 months requiring antibiotic therapy and frequent exacerbations.  Flutter valve has been tried, but has not effectively mobilized secretions.  Considered and ruled out manual CPT as no consistent skilled caregiver available to perform therapy.  CT scan performed on 04/15/2016 confirms bronchiectasis.  Meets indication for vest  therapy    CAT COPD Symptom & Quality of Life Score ([place]) 0 is no burden. 5 is highest burden 11/19/2018   Never Cough -> Cough all the time 5  No phlegm in chest -> Chest is full of phlegm 4  No chest tightness -> Chest feels very tight 3  No dyspnea for 1 flight stairs/hill -> Very dyspneic for 1 flight of stairs 5  No limitations for ADL at home -> Very limited with ADL at home 2  Confident leaving home -> Not at all confident leaving home 0  Sleep soundly -> Do not sleep soundly because of lung condition 0  Lots of Energy -> No energy at all 5  TOTAL Score (max 40)  24      ROS - per HPI     has a past medical history of Bronchiectasis ([place]), Cancer ([place]), Colon polyp, Dyspnea, Family history of lung cancer, Family history of ovarian cancer, Family history of prostate cancer, GERD (gastroesophageal reflux disease), History of radiation therapy (04/16/18- 05/14/18), Shingles (1/09 and 07/2010), and Unspecified vitamin D deficiency (02/2011 and 2016).   reports that she quit smoking about 38 years ago. Her smoking use included cigarettes. She has a 48.00 pack-year smoking history. She has never used smokeless tobacco.  Past Surgical History:  Procedure Laterality Date  . BREAST LUMPECTOMY WITH RADIOACTIVE SEED AND SENTINEL LYMPH NODE BIOPSY Left 03/19/2018   Procedure: BREAST LUMPECTOMY WITH RADIOACTIVE SEED AND SENTINEL LYMPH NODE BIOPSY;  Surgeon: Stark Klein, MD;  Location: [facility];  Service: General;  Laterality: Left;  . CATARACT EXTRACTION, BILATERAL Bilateral 03/2017, 04/2017   [facility], Dr. Alanda Slim  . SHOULDER SURGERY  03/2011   for frozen shoulder, and cyst removal (Dr. Percell Miller)  . TONSILLECTOMY  child  . VIDEO BRONCHOSCOPY Bilateral 06/21/2016   Procedure: VIDEO BRONCHOSCOPY WITHOUT FLUORO;  Surgeon: Brand Males, MD;  Location: [facility];  Service: Cardiopulmonary;  Laterality: Bilateral;    No Known Allergies  Immunization  History    Administered Date(s) Administered  . Influenza, High Dose Seasonal PF 01/26/2014, 10/19/2014, 08/30/2015, 08/08/2016, 08/05/2017, 09/02/2018  . Pneumococcal Conjugate-13 12/25/2015  . Pneumococcal Polysaccharide-23 12/30/2016  . Tdap 09/03/2017    Family History  Problem Relation Age of Onset  . Stroke Mother   . Diabetes Mother   . Stroke Sister 22       brainstem stroke  . Diabetes Sister   . Seizures Sister   . Lung cancer Father 16  . Ovarian cancer Sister 72       metastatic to lung, liver  . Cancer Sister   . Ovarian cancer Maternal Aunt 95  . Heart disease Maternal Grandmother   . Prostate cancer Cousin   . Breast cancer Neg Hx      Current Outpatient Medications:  .  Ascorbic Acid (VITAMIN C) 100 MG tablet, Take 1 tablet (100 mg total) by mouth daily., Disp: , Rfl:  .  aspirin EC 81 MG tablet, Take 81 mg by mouth daily., Disp: , Rfl:  .  atorvastatin (LIPITOR) 20 MG tablet, TAKE 1 TABLET BY MOUTH EVERY DAY, Disp: 90 tablet, Rfl: 1 .  Calcium-Magnesium-Vitamin D (CALCIUM 500 PO), Take 1 tablet by mouth daily., Disp: , Rfl:  .  cephALEXin (KEFLEX) 500 MG capsule, Take 1 capsule (500 mg total) by mouth 3 (three) times daily., Disp: 15 capsule, Rfl: 0 .  fish oil-omega-3 fatty acids 1000 MG capsule, Take 3 g by mouth daily. , Disp: , Rfl:  .  fluticasone furoate-vilanterol (BREO ELLIPTA) 100-25 MCG/INH AEPB, Inhale 1 puff into the lungs daily., Disp: 1 each, Rfl: 5 .  letrozole (FEMARA) 2.5 MG tablet, Take 1 tablet (2.5 mg total) by mouth daily., Disp: 90 tablet, Rfl: 3 .  Multiple Vitamins-Minerals (MULTIVITAMIN WITH MINERALS) tablet, Take 1 tablet by mouth daily., Disp: , Rfl:  .  predniSONE (DELTASONE) 10 MG tablet, 40 mg daily x 2 days, then 20mg  daily x 2 days, then 10mg  daily x 2 days, then 5mg  daily x 2 days and stop, Disp: 15 tablet, Rfl: 0  Current Facility-Administered Medications:  .  levalbuterol (XOPENEX) nebulizer solution 0.63 mg, 0.63 mg, Nebulization,  Once, Brand Males, MD .  methylPREDNISolone acetate (DEPO-MEDROL) injection 80 mg, 80 mg, Intramuscular, Once, Brand Males, MD      Objective:   Vitals:   11/19/18 1527  BP: 128/62  Pulse: 93  Temp: 98.3 F (36.8 C)  TempSrc: Oral  SpO2: 91%  Weight: 140 lb 9.6 oz (63.8 kg)  Height: 5\' 9"  (1.753 m)    Estimated body mass index is 20.76 kg/m as calculated from the following:   Height as of this encounter: 5\' 9"  (1.753 m).   Weight as of this encounter: 140 lb 9.6 oz (63.8 kg).  @WEIGHTCHANGE @  [employer]   11/19/18 1527  Weight: 140 lb 9.6 oz (63.8 kg)     Physical Exam  General Appearance:    Alert, cooperative, no distress, appears stated age - yesa , Deconditioned looking - mild yes , OBESE  - no, Sitting on Wheelchair -  no  Head:    Normocephalic, without obvious abnormality, atraumatic  Eyes:    PERRL, conjunctiva/corneas clear,  Ears:    Normal TM's and external ear canals, both ears  Nose:   Nares normal, septum midline, mucosa normal, no drainage    or sinus tenderness. OXYGEN ON  - no . Patient is @ ra   Throat:   Lips, mucosa, and tongue  normal; teeth and gums normal. Cyanosis on lips - no  Neck:   Supple, symmetrical, trachea midline, no adenopathy;    thyroid:  no enlargement/tenderness/nodules; no carotid   bruit or JVD  Back:     Symmetric, no curvature, ROM normal, no CVA tenderness  Lungs:     Distress - no , Wheeze yes, Barrell Chest - n, Purse lip breathing - no, Crackles - no. No distress   Chest Wall:    No tenderness or deformity.    Heart:    Regular rate and rhythm, S1 and S2 normal, no rub   or gallop, Murmur - no  Breast Exam:    NOT DONE  Abdomen:     Soft, non-tender, bowel sounds active all four quadrants,    no masses, no organomegaly. Visceral obesity - no  Genitalia:   NOT DONE  Rectal:   NOT DONE  Extremities:   Extremities - normal, Has Cane - no, Clubbing - no, Edema - no  Pulses:   2+ and symmetric all extremities   Skin:   Stigmata of Connective Tissue Disease - no  Lymph nodes:   Cervical, supraclavicular, and axillary nodes normal  Psychiatric:  Neurologic:   Pleasant - yes, Anxious - no, Flat affect - no  CAm-ICU - neg, Alert and Oriented x 3 - yes, Moves all 4s - yes, Speech - normal, Cognition - intact           Assessment:       ICD-10-CM   1. Bronchiectasis with (acute) exacerbation ([place]) J47.1   2. COPD, moderate ([place]) J44.9   3. COPD, frequent exacerbations ([facility]) J44.1 methylPREDNISolone acetate (DEPO-MEDROL) injection 80 mg    levalbuterol (XOPENEX) nebulizer solution 0.63 mg       Plan:     Patient Instructions     ICD-10-CM   1. Bronchiectasis with (acute) exacerbation ([place]) J47.1   2. COPD, moderate ([place]) J44.9   3. COPD, frequent exacerbations ([place]) J44.1    #Acute exacerbation Do xopenex neb in office Do IM depot medrol 80mg  x 1 in office Give sputum gram stain and culture for bacteria Give sputum AFB smear and culture Please take Take prednisone 40mg  once daily x 3 days, then 30mg  once daily x 3 days, then 20mg  once daily x 3 days, then prednisone 10mg  once daily  x 3 days and stop Z pak Stop fish oil  Chronic management  - continue breo daily - start spiriva respimat daily - continue mucinex - start 3% saline neb 86mL once daily scheduled - use duoneb 1-2 times daily as needed - start smart vest daily  - future will consider daliresp - CT scan chest without contrast in 4 weeks  Followup - 4 weeks or sooner if needed; CAT score at followup - if worse go to ER           SIGNATURE    Dr. Brand Males, M.D., F.C.C.P,  Pulmonary and Critical Care Medicine Staff Physician, [place] Director - Interstitial Lung Disease  Program  Pulmonary [place] at [place], [place], [address]  Pager: [phone], If no answer or between  15:00h - 7:00h: call [phone] Telephone: [phone]  4:22 PM 11/19/2018

## 2018-11-19 NOTE — Addendum Note (Signed)
Addended by: Suzzanne Cloud E on: 11/19/2018 04:48 PM   Modules accepted: Orders

## 2018-11-19 NOTE — Addendum Note (Signed)
Addended by: Suzzanne Cloud E on: 11/19/2018 04:42 PM   Modules accepted: Orders

## 2018-11-25 LAB — RESPIRATORY CULTURE OR RESPIRATORY AND SPUTUM CULTURE
MICRO NUMBER:: 91458414
SPECIMEN QUALITY:: ADEQUATE

## 2018-11-26 ENCOUNTER — Telehealth: Payer: Self-pay | Admitting: Internal Medicine

## 2018-11-26 MED ORDER — CIPROFLOXACIN HCL 750 MG PO TABS: 750.0000 mg | ORAL_TABLET | Freq: Two times a day (BID) | ORAL | 0 refills | Status: DC

## 2018-11-26 NOTE — Telephone Encounter (Signed)
Patient returning call, [phone] (work until 4:00 pm), after 4:00pm call [phone].

## 2018-11-26 NOTE — Telephone Encounter (Signed)
Called and spoke with pt letting her know the results of the sputum culture and stated to her that MR wanted her to take ciproflox. Pt expressed understanding. I verified pt's preferred pharmacy. Stated to pt if she started having any problems, any pains anywhere or any tendonitis to call our office immediately so we could address this with MR. Pt expressed understanding.  Also clarified with pt the date she is having the CT scan. While looking at that, pt had not been scheduled for a f/u appt with MR. I did schedule her a f/u with MR after CT scan so he can go over results with her. Nothing further needed.

## 2018-11-26 NOTE — Telephone Encounter (Signed)
lmtcb x1 for pt. 

## 2018-11-26 NOTE — Telephone Encounter (Signed)
Sputum growing pseudomonas. This is different prior prior microbe which was H Flu  Plan  - stop z pak - start ciproflox oral 750 mg twice daily x 14 days   - any tendonitis - call us immediately (will need to stop)     SIGNATURE    Dr. Brand Males, M.D., F.C.C.P,  Pulmonary and Critical Care Medicine Staff Physician, [place] Director - Interstitial Lung Disease  Program  Pulmonary [facility] at [place], [place], [address]  Pager: [phone], If no answer or between  15:00h - 7:00h: call [phone] Telephone: [phone]  9:02 AM 11/26/2018

## 2018-11-30 ENCOUNTER — Telehealth: Payer: Self-pay | Admitting: Internal Medicine

## 2018-11-30 NOTE — Telephone Encounter (Signed)
Chronic management  - continue breo daily - start spiriva respimat daily - continue mucinex - start 3% saline neb 99mL once daily scheduled - use duoneb 1-2 times daily as needed - start smart vest daily  - future will consider daliresp ______________________________________________________________  Hulen Skains and spoke with patient advised her of above directions. Patient verbalized understanding. Nothing further needed.

## 2018-12-16 DIAGNOSIS — U071 COVID-19: Secondary | ICD-10-CM

## 2018-12-16 HISTORY — DX: COVID-19: U07.1

## 2018-12-18 ENCOUNTER — Ambulatory Visit (INDEPENDENT_AMBULATORY_CARE_PROVIDER_SITE_OTHER)
Admission: RE | Admit: 2018-12-18 | Discharge: 2018-12-18 | Disposition: A | Discharge status: Home / Self Care | Payer: Medicare Other | Source: Ambulatory Visit | Attending: Internal Medicine | Admitting: Internal Medicine

## 2018-12-18 ENCOUNTER — Telehealth: Payer: Self-pay | Admitting: Internal Medicine

## 2018-12-18 DIAGNOSIS — J471 Bronchiectasis with (acute) exacerbation: Secondary | ICD-10-CM

## 2018-12-18 DIAGNOSIS — J479 Bronchiectasis, uncomplicated: Secondary | ICD-10-CM | POA: Diagnosis not present

## 2018-12-18 NOTE — Telephone Encounter (Signed)
Tana Coast  Amarrion Pastorino, [place], CMA        Collie Siad with [employer] [phone]   Previous Messages    ----- Message -----  From: Lorretta Harp, CMA  Sent: 12/18/2018  3:05 PM EST  To: Tana Coast  Subject: RE: Kalman Drape                   Is there a phone number that you have that I can give patient if I am able to reach her in regards to this?  ----- Message -----  From: Tana Coast  Sent: 12/18/2018 11:32 AM EST  To: Waldemar Dickens Naliah Eddington, CMA, Brand Males, MD  Subject: Georgeanne Nim from [employer] called today stating that they have not been able to speak with this patient the [employer]vest. They need to get her authorization to run the vest through her insurance and have put the vest on hold until they can get in touch with the patient. They wanted to know if we could do anything. If they left a message it would state [employer] not [employer]  Rodena Piety       Received the above staff message from Ronalee Belts in regards to pt beginning Alma and spoke with pt letting her know that Collie Siad with [employer] has been trying to reach her in regards to authorization with the [employer]. Pt stated that she has had a lot going on and that is why she has not reached out to them. I did provide pt with Sue's phone number for her to contact. Nothing further needed.

## 2018-12-29 ENCOUNTER — Encounter: Payer: Self-pay | Admitting: Internal Medicine

## 2018-12-29 ENCOUNTER — Ambulatory Visit: Payer: Medicare Other | Admitting: Internal Medicine

## 2018-12-29 VITALS — BP 128/76 | HR 75 | Ht 69.0 in | Wt 143.2 lb

## 2018-12-29 DIAGNOSIS — I2510 Atherosclerotic heart disease of native coronary artery without angina pectoris: Secondary | ICD-10-CM | POA: Diagnosis not present

## 2018-12-29 DIAGNOSIS — J449 Chronic obstructive pulmonary disease, unspecified: Secondary | ICD-10-CM

## 2018-12-29 DIAGNOSIS — K449 Diaphragmatic hernia without obstruction or gangrene: Secondary | ICD-10-CM

## 2018-12-29 DIAGNOSIS — K219 Gastro-esophageal reflux disease without esophagitis: Secondary | ICD-10-CM | POA: Diagnosis not present

## 2018-12-29 DIAGNOSIS — R911 Solitary pulmonary nodule: Secondary | ICD-10-CM

## 2018-12-29 DIAGNOSIS — J479 Bronchiectasis, uncomplicated: Secondary | ICD-10-CM | POA: Diagnosis not present

## 2018-12-29 NOTE — Addendum Note (Signed)
Addended by: Joella Prince on: 12/29/2018 12:02 PM   Modules accepted: Orders

## 2018-12-29 NOTE — Progress Notes (Signed)
IOV 06/09/2015  Chief Complaint  Patient presents with  . Pulmonary Consult    Pt referred by Dr. Tomi Bamberger for abnormal CT.    79 year old female somewhat of a poor historian. History is gained from repeatedly asking the same questions in many different ways and also review of Dr Rita Ohara notes in the EMR   She reports that she is a 40 pack smoker having quit 34 years ago. In the 1970s and 1980s she had recurrent bouts of pneumonia which left her with bronchiectasis. At baseline she's had some chronic cough of unclear severity and shortness of breath with exertion of unclear severity but probably mild. Unclear if she has associated sputum. Unclear what aggravating and relieving factors where or are. Then in for a 2016 she developed over several weeks worsening cough with green sputum and some fever. Apparently was diagnosed with pneumonia. This then resulted in a CT scan of the chest and her 2016 which I personally reviewed and shows bilateral bronchiectasis particularly on the right middle lobe. She also had a nodule. Antibody treatment she improved. Then in April 2016 was started on Brio which inhaler therapy help her cough and shortness of breath significantly. Currently these are not much much of an issue for her. She has a good quality of life with the Brio. The Brio is expensive. She had follow-up CT scan of the chest 04/26/2015 that shows resolution of the nodule but persistence of the bronchiectasis. In fact she seems to have chronic bronchiectasis dating back on several chest x-rays although it back to 2009. She had test that she's had bronchiectasis since the 1980s. Currently no sputum production not much of a cough or shortness of breath. She denies having had bronchoscopy for the same.  She is not interested in much testing  Noted spirometry pre-breo March 2016  - fev 1.14L/45%, R 56 - severe obstructive lung disease And post Breo in June 05 2015  - Fev 1.32L/52%, R 57 ->  significantly improved but still in moderate category   OV 11/06/2015  Chief Complaint  Patient presents with  . Follow-up    Pt here after PFT. Pt states she respiratory infection in October and was on 2 rounds of abx and is now back to her baseline. Pt c/o mild non prod cough. Pt denies CP/tightness.    Previous heavy smoker with right middle lobe chronic bronchiectasis with findings suggestive of MAI and moderate/severe obstructive lung disease with findings of emphysema on CT chest - February and May 2016  - Last seen in June 2016. At that time he started Brio. Overall she has been doing well. However in October 2016 she had a respiratory exacerbation. She was treated with 2 rounds of anabiotic's and prednisone course and then she fell baseline. She is followed up with a pulmonary function test  t 10/30/2015) baseline and well. 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 Currently she is feeling well. Review of vaccine shows she is up-to-date with flu shot. She thinks she has had the pneumonia vaccine with her primary care physician but she's not sure and she is going to check with her.  No new issues.  OV 02/12/2016  Chief Complaint  Patient presents with  . Follow-up    Pt reports having some increased breathing issues since last OV- treated a few times with abx/pred for infections. Most recent x  1 week ago- Zpak w/Pred taper. Pt notes some green mucus but cough is much improved.   Heavy smoker with right middle lobe chronic bronchiectasis and some lingular bronchiectasis and scattered infiltrates in the lower lobe based on CT February and May 2016. May 2016 with improvement   - This is a routine follow-up. Last seen November 2016. She is compliant with her inhalers. In the interim approximately on Valentine's Day 2017 had another flareup and he called in Z-Pak and prednisone. This has  helped. Cough is significantly worse but it has improved although not at baseline. She slowly getting better. This will be her third exacerbation. Review of the chart shows an visualization of the CT chest which I did myself and showed her shows findings described above. Chart review also shows that do not had a complete bronchiectasis hematological and etiological workup. She is open to having bronchoscopy   OV 04/23/2016  Chief Complaint  Patient presents with  . Follow-up    Pt here after CT chest. Pt states her breathing has improved since last OV. Pt states she is still coughing in the morning but now there is less mucus production.      Heavy smoker with right middle lobe chronic bronchiectasis and some lingular bronchiectasis and scattered infiltrates in the lower lobe based on CT February and May 2016. PFT Nov 2016 -> fev 1.36L/51%, R 60. DLCO 16/51%   In  Nov 2016 I added spiriva and now on triple inhaler Rx- says with this symptoms she is significantly better. Still with cough. Some spuitum +. Labs for etiology reviewed done feb 2017 -> IgG, IgA, IgM - normal. Allpha 1AT - normal. Autoimmune - ANA, CCP, dsDNA, RF, ENA, SSA, SSB, scl-70, RNP - negative.    OV 07/01/2016  Chief Complaint  Patient presents with  . Follow-up    Follow up bronchoscopy.  pt c/o R sided back pain X3 days, nonprod cough.      Idiopathic bronchiectasis. Here to review results. Underwent bronchoscopy with lavage 06/21/2016 that showed acute neutrophils 91%. Culture was negative. AFB smear is negative fungal smear was negative. AFB culture is pending. Bronchoscopy showed a lot of mucus. She says that when she lies on she has significant cough and mucus. She feels that after bronchoscopy she improved because of the removal of mucus but shortly after this for the last few days she's having right infrascapular pleuritic and off chest tightness that is consistent with previous exacerbation and she feels she needs  an antibiotic and prednisone course. She is compliant with Spiriva and Brio at this point. Overall even at baseline she admits to copious white mucus.   OV 09/16/2016  Chief Complaint  Patient presents with  . Follow-up    Pt states that she does have some lung congestion, little cough and wheezing with SOB. Pt states that she is trying to learn when to "rest" and not overdo herself. Needs refills Breo and Spiriva Resp.    Follow-up idiopathic bronchiectasis. Last visit mid July 2017. After that called in 09/04/2016 with symptoms of bronchiectasis exacerbation. Called in Z-Pak and prednisone. She is back to baseline. At this point in time she tells me that she does not have daily mucus other than mild amounts. She does admit that she is noncompliant with physician with chest physical therapy. She recollects that one time she position herself in an inversion table for back therapy and this cleared of the mucus and she felt much better. She admits that  she needs to do a better job with positional therapy. She is up-to-date with flu shot. She continues on Spiriva and Brio triple inhaler therapy. There are no new issues.   OV 04/24/2017  Chief Complaint  Patient presents with  . Acute Visit    Pt c/o hoarseness, prod cough with green mucus, chest tightness x 2 days. Pt denies f/c/s and increase in SOB.   Acute visit for this patient with idiopathic bronchiectasis.  December 2017 she saw Dr. Baltazar Apo with an acute exacerbation. After that she went back to baseline. She does not use chest physical therapy but she uses inhaler therapy only. Now for the last 2 days she's having increased cough with chest tightness and green mucous. Baseline is clear yellow mucus. Low-grade fever present in the office. No wheezing or increased shortness of breath. Significant hoarseness of voice present and some postnasal drip present. She feels she is in bronchiectasis exacerbation. There are no other issues. It is  noted that in July 2017 bronchoscopy did not reveal any organisms on lavage    03/17/2018 Follow up : Bronchiectasis /COPD  Returns for a follow-up for bronchiectasis/COPD .  Says that she has been doing well over the last year.  She had no increased cough or congestion.  No recent antibiotic use.  She denies any shortness of breath, hemoptysis, weight loss, edema.  She does have an occasional cough that is minimally productive.  She is very active and independent.  She remains on BREO daily .,  Does depend on samples.  Has not taken Spiriva in >1 year. Has not felt that she needed this.  Recently diagnosed left breast cancer. Plans for surgery later this week.    OV 09/02/2018  Subjective:  Patient ID: Molly Ross, female , DOB: February 21, 1941 , age 64 y.o. , MRN: [id number] , ADDRESS: [place] [address]   09/02/2018 -   Chief Complaint  Patient presents with  . Follow-up    Pt states she has been doing good since last visit except she does have complaints of fatigue and is coughing with occ green to yellow mucus. States cough is worse in the mornings.   78 year old female former smoker followed for COPD and Bronchiectasis. 10/30/2015) baseline and well. Post broncho-dilator FEV1 1.36 L/51% and a ratio 60, total lung capacity 5.2 L/90%, DLCO 16/51%. Last CT 2016 and May 2017 and CXR aug 2019 with chronic changes   HPI Molly Ross 78 y.o. - female with bronchiectasis maintained on Brio. I personally have not seen her since May 2018. Since then she has seen my colleagues for flareupsand gotten anabiotic's of prednisone. Most recently in August 2019. Chest x-ray that time showed just chronic changes. Since that time overall she's feeling well. She is stable. She has sputum production every few days. She uses a flutter valve as needed only. Sometimes of sputum is thick. But overall stable no change in color. Does not feel she is in a flareup.She continuesl and the  fluas needed. She is not on Mucinex. She wants to have the flu shot today.he does been a few years since she had pulmonary function test.   She takes care of her sister in a wheelchair. Another sister died from ovarian cancer.   ROS - per HPI   OV 11/19/2018  Subjective:  Patient ID: Molly Ross, female , DOB: 31-Aug-1941 , age 7 y.o. , MRN: [id number] , ADDRESS: [place] [id number]  11/19/2018 -   Chief Complaint  Patient presents with  . Follow-up    f/u bronchiectasis, still coughing up green mucus,      HPI Molly Ross 78 y.o. -follow bronchiectasis associated with COPD colonized with Haemophilus influenza as determined by bronchoscopy in June 2018.  Maintained on Breo and flutter valve  Last seen mid September 2019.  Since then she has had 2 exacerbations treated over the phone.  The first 1 before Halloween 2019 and the second 1 before Thanksgiving 2019.  She states that with the second 1 just before Thanksgiving 2019 she really has not recovered.  She says she feels extremely fatigued and miserable.  In fact her cough is significantly worse than baseline.  She is bringing up copious amount of green sputum daily.  She is also having chest tightness and wheezing.  Also short of breath.  There is no hemoptysis or fever or chills or orthopnea proximal nocturnal dyspnea.  Overall she feels miserable. Continues with breo and flutter valve COPD CAT score  She is not on saline nebulizer treatment or vibratory vest or Roflumilast or nebulizer or inhaled anticholinergic.   Patient has has daily productive cough lasting greater than 24 months requiring antibiotic therapy and frequent exacerbations.  Flutter valve has been tried, but has not effectively mobilized secretions.  Considered and ruled out manual CPT as no consistent skilled caregiver available to perform therapy.  CT scan performed on 04/15/2016 confirms bronchiectasis.  Meets indication for vest  therapy   OV 12/29/2018  Subjective:  Patient ID: Molly Ross, female , DOB: 07-19-1941 , age 50 y.o. , MRN: [id number] , ADDRESS: [place] [id number]   12/29/2018 -   Chief Complaint  Patient presents with  . Results   follow bronchiectasis associated with COPD colonized with Haemophilus influenza as determined by bronchoscopy in June 2018 , pseudomonas on sputum Dec 2019   Maintained on Breo and flutter valve and in December 2019 added 3% saline nebulizer and Spiriva   HPI Molly Ross 78 y.o. -presents for follow-up after her November 16, 2018 visit for recurrent COPD/bronchiectasis exacerbation.  Sputum culture grew Pseudomonas and will change antibiotics to ciprofloxacin.  We introduce Spiriva, 3% saline nebulizer and also vibratory vest.  Of these measures she did the ciprofloxacin Spiriva and 3% saline nebulizer.  With this along with the Brio and flutter valve her symptoms are significantly improved.  She hardly brings out any sputum and it is clear.  She does not want to do the vibratory vest because she is worried about chest muscle soreness after her breast cancer treatment and also the $1900 co-pay which apparently is one time.  She is not doing any Mucinex.  She is not interested in cyclical suppressive antibiotic therapy at this stage.  She did have a CT scan of the chest and this the bronchiectasis is evident, and compared to 2017 8 mm left lower lobe nodule is unchanged some of the volume loss is unchanged and stable.  There is a report of coronary artery calcification which is a new finding.  She does not have any chest pain.  Her previous cardiologist is Dr. Tollie Eth and he did a stress test according to him many years ago.  She wants to see him again.  She is also worried about hiatal hernia finding in the CT scan.  The report is very small hiatal hernia.  But she does admit to significant acid reflux for which  she is not taking any treatment.   She is agreed to take Prilosec and will follow-up with primary care physician.     CAT COPD Symptom & Quality of Life Score (GSK trademark) 0 is no burden. 5 is highest burden 11/19/2018  12/29/2018   Never Cough -> Cough all the time 5 4  No phlegm in chest -> Chest is full of phlegm 4 4  No chest tightness -> Chest feels very tight 3 1  No dyspnea for 1 flight stairs/hill -> Very dyspneic for 1 flight of stairs 5 4  No limitations for ADL at home -> Very limited with ADL at home 2 3  Confident leaving home -> Not at all confident leaving home 0 1  Sleep soundly -> Do not sleep soundly because of lung condition 0 0  Lots of Energy -> No energy at all 5 3  TOTAL Score (max 40)  24 20     ROS - per HPI     has a past medical history of Bronchiectasis ([place]), Cancer ([place]), Colon polyp, Dyspnea, Family history of lung cancer, Family history of ovarian cancer, Family history of prostate cancer, GERD (gastroesophageal reflux disease), History of radiation therapy (04/16/18- 05/14/18), Shingles (1/09 and 07/2010), and Unspecified vitamin D deficiency (02/2011 and 2016).   reports that she quit smoking about 38 years ago. Her smoking use included cigarettes. She has a 48.00 pack-year smoking history. She has never used smokeless tobacco.  Past Surgical History:  Procedure Laterality Date  . BREAST LUMPECTOMY WITH RADIOACTIVE SEED AND SENTINEL LYMPH NODE BIOPSY Left 03/19/2018   Procedure: BREAST LUMPECTOMY WITH RADIOACTIVE SEED AND SENTINEL LYMPH NODE BIOPSY;  Surgeon: Stark Klein, MD;  Location: [facility];  Service: General;  Laterality: Left;  . CATARACT EXTRACTION, BILATERAL Bilateral 03/2017, 04/2017   [facility], Dr. Alanda Slim  . SHOULDER SURGERY  03/2011   for frozen shoulder, and cyst removal (Dr. Percell Miller)  . TONSILLECTOMY  child  . VIDEO BRONCHOSCOPY Bilateral 06/21/2016   Procedure: VIDEO BRONCHOSCOPY WITHOUT FLUORO;  Surgeon: Brand Males, MD;  Location: [facility];   Service: Cardiopulmonary;  Laterality: Bilateral;    No Known Allergies  Immunization History  Administered Date(s) Administered  . Influenza, High Dose Seasonal PF 01/26/2014, 10/19/2014, 08/30/2015, 08/08/2016, 08/05/2017, 09/02/2018  . Pneumococcal Conjugate-13 12/25/2015  . Pneumococcal Polysaccharide-23 12/30/2016  . Tdap 09/03/2017    Family History  Problem Relation Age of Onset  . Stroke Mother   . Diabetes Mother   . Stroke Sister 36       brainstem stroke  . Diabetes Sister   . Seizures Sister   . Lung cancer Father 63  . Ovarian cancer Sister 35       metastatic to lung, liver  . Cancer Sister   . Ovarian cancer Maternal Aunt 95  . Heart disease Maternal Grandmother   . Prostate cancer Cousin   . Breast cancer Neg Hx      Current Outpatient Medications:  .  Ascorbic Acid (VITAMIN C) 100 MG tablet, Take 1 tablet (100 mg total) by mouth daily., Disp: , Rfl:  .  aspirin EC 81 MG tablet, Take 81 mg by mouth daily., Disp: , Rfl:  .  atorvastatin (LIPITOR) 20 MG tablet, TAKE 1 TABLET BY MOUTH EVERY DAY, Disp: 90 tablet, Rfl: 1 .  Calcium-Magnesium-Vitamin D (CALCIUM 500 PO), Take 1 tablet by mouth daily., Disp: , Rfl:  .  fluticasone furoate-vilanterol (BREO ELLIPTA) 100-25 MCG/INH AEPB, Inhale 1 puff  into the lungs daily., Disp: 1 each, Rfl: 5 .  ipratropium-albuterol (DUONEB) 0.5-2.5 (3) MG/3ML SOLN, Take 3 mLs by nebulization every 4 (four) hours as needed., Disp: 360 mL, Rfl: 2 .  letrozole (FEMARA) 2.5 MG tablet, Take 1 tablet (2.5 mg total) by mouth daily., Disp: 90 tablet, Rfl: 3 .  Multiple Vitamins-Minerals (MULTIVITAMIN WITH MINERALS) tablet, Take 1 tablet by mouth daily., Disp: , Rfl:  .  sodium chloride HYPERTONIC 3 % nebulizer solution, Take by nebulization as needed for other., Disp: 750 mL, Rfl: 2 .  Tiotropium Bromide Monohydrate (SPIRIVA RESPIMAT) 1.25 MCG/ACT AERS, Inhale 2 puffs into the lungs daily., Disp: 2 Inhaler, Rfl: 0      Objective:    Vitals:   12/29/18 1100  BP: 128/76  Pulse: 75  SpO2: 96%  Weight: 143 lb 3.2 oz (65 kg)  Height: 5\' 9"  (1.753 m)    Estimated body mass index is 21.15 kg/m as calculated from the following:   Height as of this encounter: 5\' 9"  (1.753 m).   Weight as of this encounter: 143 lb 3.2 oz (65 kg).  @WEIGHTCHANGE @  [employer]   12/29/18 1100  Weight: 143 lb 3.2 oz (65 kg)     Physical Exam  General Appearance:    Alert, cooperative, no distress, appears stated age - no , Deconditioned looking - no , OBESE  - no, Sitting on Wheelchair -  no  Head:    Normocephalic, without obvious abnormality, atraumatic  Eyes:    PERRL, conjunctiva/corneas clear,  Ears:    Normal TM's and external ear canals, both ears  Nose:   Nares normal, septum midline, mucosa normal, no drainage    or sinus tenderness. OXYGEN ON  - no . Patient is @ no   Throat:   Lips, mucosa, and tongue normal; teeth and gums normal. Cyanosis on lips - no  Neck:   Supple, symmetrical, trachea midline, no adenopathy;    thyroid:  no enlargement/tenderness/nodules; no carotid   bruit or JVD  Back:     Symmetric, no curvature, ROM normal, no CVA tenderness  Lungs:     Distress - no , Wheeze no, Barrell Chest - mildyes, Purse lip breathing - no, Crackles - no   Chest Wall:    No tenderness or deformity.    Heart:    Regular rate and rhythm, S1 and S2 normal, no rub   or gallop, Murmur - no  Breast Exam:    NOT DONE  Abdomen:     Soft, non-tender, bowel sounds active all four quadrants,    no masses, no organomegaly. Visceral obesity - no  Genitalia:   NOT DONE  Rectal:   NOT DONE  Extremities:   Extremities - normal, Has Cane - no, Clubbing - no, Edema - no  Pulses:   2+ and symmetric all extremities  Skin:   Stigmata of Connective Tissue Disease - no  Lymph nodes:   Cervical, supraclavicular, and axillary nodes normal  Psychiatric:  Neurologic:   Pleasant - yes, Anxious - no, Flat affect - no  CAm-ICU - neg, Alert  and Oriented x 3 - yes, Moves all 4s - yes, Speech - normal, Cognition - intact           Assessment:       ICD-10-CM   1. COPD, moderate ([place]) J44.9   2. Bronchiectasis without complication ([place]) W09.8   3. Coronary artery calcification seen on CAT scan I25.10   4. Hiatal  hernia with GERD K21.9    K44.9   5. Nodule of lower lobe of left lung R91.1        Plan:     Patient Instructions  Bronchiectasis without complication ([place])  -Improved from recent flareup -Continue Spiriva and Breo -Continue 3% saline nebulizer, 3-5 mL, 1-2 times daily -Continue flutter valve 2-4 times daily -Start over-the-counter Mucinex DM twice daily -Hold off on vibratory vest but if repeated exacerbations are high sputum volume continue then we will start this -In the future Roflumilast to prevent frequent flareups as an option along with suppressive cyclical antibiotic therapy  Coronary artery calcification seen on CAT scan  -Refer Dr. Tollie Eth but if he is not available then referred to [facility] or [place] [facility]  Hiatal hernia with GERD  -The hiatal hernia is very tiny but you have significant acid reflux which might also be contributing to worsening respiratory symptoms -Start over-the-counter Prilosec 20 mg daily on empty stomach -Follow this issue with primary care physician  8 mm left lower lobe lung nodule -Stable 2017 through 2020 -No further follow-up  Follow-up 6 months or sooner if needed        SIGNATURE    Dr. Brand Males, M.D., F.C.C.P,  Pulmonary and Critical Care Medicine Staff Physician, [place] Director - Interstitial Lung Disease  Program  Pulmonary [place] [facility] [place], [place], [address]  Pager: [phone], If no answer or between  15:00h - 7:00h: call [phone] Telephone: [phone]  11:49 AM 12/29/2018

## 2018-12-29 NOTE — Patient Instructions (Addendum)
Bronchiectasis without complication ([place])  -Improved from recent flareup -Continue Spiriva and Breo -Continue 3% saline nebulizer, 3-5 mL, 1-2 times daily -Continue flutter valve 2-4 times daily -Start over-the-counter Mucinex DM twice daily -Hold off on vibratory vest but if repeated exacerbations are high sputum volume continue then we will start this -In the future Roflumilast to prevent frequent flareups as an option along with suppressive cyclical antibiotic therapy  Coronary artery calcification seen on CAT scan  -Refer Dr. Tollie Eth but if he is not available then referred to [facility] cardiovascular or [place] [facility]  Hiatal hernia with GERD  -The hiatal hernia is very tiny but you have significant acid reflux which might also be contributing to worsening respiratory symptoms -Start over-the-counter Prilosec 20 mg daily on empty stomach -Follow this issue with primary care physician  8 mm left lower lobe lung nodule -Stable 2017 through 2020 -No further follow-up  Follow-up 6 months or sooner if needed

## 2019-01-05 LAB — AFB CULTURE WITH SMEAR (NOT AT ARMC)
Acid Fast Culture: NEGATIVE
Acid Fast Smear: NEGATIVE

## 2019-01-06 ENCOUNTER — Telehealth: Payer: Self-pay | Admitting: Internal Medicine

## 2019-01-06 NOTE — Telephone Encounter (Signed)
Staff message: [place]  [place] Schaper, Waldemar Dickens, CMA; Brand Males, MD        Collie Siad from [employer] called stating the patient wanted to cancel the vest order per MR. I see in MR's note  "-Hold off on vibratory vest but if repeated exacerbations are high sputum volume continue then we will start this"   They do have patient assistance available. They sent the forms to Mrs. Kister for her to fill out to see if she qualifies for the assistance. If she does qualify then they will only take whatever the insurance pays and will waive the patients part. Collie Siad was wondering if we could talk with the patient about completing the forms and return them just to see where she stands   Thanks,  [employer] and spoke with pt stating to her the above staff message. Pt stated to me she had received the patient assistance forms and had also spoken with someone from the [employer]. Pt stated to me due to her recently having breast cancer a year ago, she is hesitant about doing the vest at this time and also stated after having her OV with MR 12/29/2018, at this time she is going to hold off on it and will try to do recs that he discussed with her at the [place]. Pt stated if she decided to go through with the vest, she would call the [employer] and would also fill out the patient assistance forms to see if she would be able to qualify for assistance.  Nothing further needed.

## 2019-02-08 NOTE — Progress Notes (Signed)
Cardiology Office Note:    Date:  02/09/2019   ID:  Molly Ross, Molly Ross 03-02-1941, MRN [id number]  PCP:  Rita Ohara, MD  Cardiologist:  Shirlee More, MD    Referring MD: Brand Males, MD    ASSESSMENT:    1. Coronary artery calcification seen on CAT scan   2. Hyperlipidemia, unspecified hyperlipidemia type   3. COPD, moderate ([place])   4. Bronchiectasis with (acute) exacerbation (HCC)    PLAN:    In order of problems listed above:  1. She has coronary artery calcification on CT scan this is not infrequent is seen approximately 50% of medium risk individuals at age 54-60.  It is not synonymous with coronary artery disease however it should trigger a review of probability of CAD treatment with aspirin and statin and consideration of ischemia evaluation.  She will continue her aspirin continue her statin at this time has elected not to have a repeat ischemia evaluation.  I think that is reasonable. 2. Continue her statin 3. Stable managed by pulmonary 4. Improved after her recent antibiotic course, this is her primary morbidity.   Next appointment: as needed   Medication Adjustments/Labs and Tests Ordered: Current medicines are reviewed at length with the patient today.  Concerns regarding medicines are outlined above.  Orders Placed This Encounter  Procedures  . EKG 12-Lead   No orders of the defined types were placed in this encounter.   No chief complaint on file.   History of Present Illness:    Molly Ross is a 78 y.o. female with a hx of COPD bronchiectasis left breast XRT for cancer and CAC last seen 08/07/2015.  Prior to that she had undergone a stress myocardial perfusion study which showed no ischemia with a normal exercise tolerance.  Ejection fraction of 76%. Compliance with diet, lifestyle and medications: Yes  She is seen today because of coronary artery calcification on recent CT of the chest.  She forgot that she had the same problem 2016 at that  time she had a myocardial perfusion study with no ischemia.  In the interim she has had no cardiac events does not have a pattern of angina no change in her chronic shortness of breath and recurrent pulmonary infections.  She has had no edema orthopnea palpitation or syncope.  Reviewed the options for care which include continue aspirin and Lipitor that are helpful for general atherosclerosis or performing an ischemia evaluation and have done this time I have advocated cardiac CTA after discussion of the options and benefits she elects chronicling medical treatment and will defer an ischemia evaluation at this time not having angina.  She continues to be short of breath for more than walking a short distance indoors which is unchanged related to her COPD and just had another recurrent pulmonary infection.  At this time she actually feels quite well she is that is the best she has been in a long period of time.  Other cardiovascular risk in her case include breast cancer with radiation of the left chest. Past Medical History:  Diagnosis Date  . Atherosclerosis of aorta ([place]) 02/02/2015   Noted on imaging   . Bronchiectasis ([place])   . Bronchiectasis with (acute) exacerbation ([place]) 06/09/2015  . CAD (coronary artery disease), native coronary artery    Coronary calcification noted on CT scan 2016  Negative treadmill myoview 07/20/15 with EF 76%   . Cancer ([place])   . Cellulitis of left breast 10/01/2018  . Colon polyp   .  COPD, moderate ([place]) 11/06/2015  . Depressive disorder, not elsewhere classified 12/04/2011  . Dyspnea   . Emphysema of lung ([place]) 02/02/2015  . Family history of lung cancer   . Family history of ovarian cancer   . Family history of prostate cancer   . Genetic testing 03/27/2018   The Common Hereditary Cancer Panel offered by [facility] includes sequencing and/or deletion duplication testing of the following 47 genes: APC, ATM, AXIN2, BARD1, BMPR1A, BRCA1, BRCA2, BRIP1, CDH1, CDKN2A (p14ARF),  CDKN2A (p16INK4a), CKD4, CHEK2, CTNNA1, DICER1, EPCAM (Deletion/duplication testing only), GREM1 (promoter region deletion/duplication testing only), KIT, MEN1, MLH1, MSH2, MSH3, MSH6, MU  . GERD (gastroesophageal reflux disease)   . Hiatal hernia 02/02/2015   small, noted on CT of chest   . History of radiation therapy 04/16/18- 05/14/18   42.56 Gy directed to the Left Breast in 16 fractions followed by a boost of 8 Gy in 4 fractions  . History of smoking 25-50 pack years 06/09/2015  . Malignant neoplasm of lower-inner quadrant of left breast in female, estrogen receptor positive ([place]) 03/06/2018  . Osteopenia 02/18/2018   DEXA 02/2018, T-1.8 R femoral neck  . Pleuritic pain 10/01/2018  . Shingles 1/09 and 07/2010  . Unspecified vitamin D deficiency 02/2011 and 2016  . Vitamin D deficiency 02/14/2011    Past Surgical History:  Procedure Laterality Date  . BREAST LUMPECTOMY WITH RADIOACTIVE SEED AND SENTINEL LYMPH NODE BIOPSY Left 03/19/2018   Procedure: BREAST LUMPECTOMY WITH RADIOACTIVE SEED AND SENTINEL LYMPH NODE BIOPSY;  Surgeon: Stark Klein, MD;  Location: [facility];  Service: General;  Laterality: Left;  . CATARACT EXTRACTION, BILATERAL Bilateral 03/2017, 04/2017   [facility], Dr. Alanda Slim  . SHOULDER SURGERY  03/2011   for frozen shoulder, and cyst removal (Dr. Percell Miller)  . TONSILLECTOMY  child  . VIDEO BRONCHOSCOPY Bilateral 06/21/2016   Procedure: VIDEO BRONCHOSCOPY WITHOUT FLUORO;  Surgeon: Brand Males, MD;  Location: [facility];  Service: Cardiopulmonary;  Laterality: Bilateral;    Current Medications: Current Meds  Medication Sig  . Ascorbic Acid (VITAMIN C) 100 MG tablet Take 1 tablet (100 mg total) by mouth daily.  Marland Kitchen aspirin EC 81 MG tablet Take 81 mg by mouth daily.  Marland Kitchen atorvastatin (LIPITOR) 20 MG tablet TAKE 1 TABLET BY MOUTH EVERY DAY  . Calcium-Magnesium-Vitamin D (CALCIUM 500 PO) Take 1 tablet by mouth daily.  . fluticasone furoate-vilanterol (BREO ELLIPTA)  100-25 MCG/INH AEPB Inhale 1 puff into the lungs daily.  Marland Kitchen ipratropium-albuterol (DUONEB) 0.5-2.5 (3) MG/3ML SOLN Take 3 mLs by nebulization every 4 (four) hours as needed.  Marland Kitchen letrozole (FEMARA) 2.5 MG tablet Take 1 tablet (2.5 mg total) by mouth daily.  . Multiple Vitamins-Minerals (MULTIVITAMIN WITH MINERALS) tablet Take 1 tablet by mouth daily.  . sodium chloride HYPERTONIC 3 % nebulizer solution Take by nebulization as needed for other.  . Tiotropium Bromide Monohydrate (SPIRIVA RESPIMAT) 1.25 MCG/ACT AERS Inhale 2 puffs into the lungs daily.     Allergies:   Patient has no known allergies.   Social History   Socioeconomic History  . Marital status: Widowed    Spouse name: Not on file  . Number of children: Not on file  . Years of education: Not on file  . Highest education level: Not on file  Occupational History  . Occupation: [employer]  Social Needs  . Financial resource strain: Not on file  . Food insecurity:    Worry: Not on file    Inability: Not  on file  . Transportation needs:    Medical: Not on file    Non-medical: Not on file  Tobacco Use  . Smoking status: Former Smoker    Packs/day: 2.00    Years: 24.00    Pack years: 48.00    Types: Cigarettes    Last attempt to quit: 06/28/1980    Years since quitting: 38.6  . Smokeless tobacco: Never Used  . Tobacco comment: smoked 1-2 PPD x 24 years;  Substance and Sexual Activity  . Alcohol use: Yes    Alcohol/week: 0.0 standard drinks    Comment: 1 glass of wine once a week (0-3)  . Drug use: No  . Sexual activity: Not Currently  Lifestyle  . Physical activity:    Days per week: Not on file    Minutes per session: Not on file  . Stress: Not on file  Relationships  . Social connections:    Talks on phone: Not on file    Gets together: Not on file    Attends religious service: Not on file    Active member of club or organization: Not on file    Attends meetings of clubs or organizations: Not on file     Relationship status: Not on file  Other Topics Concern  . Not on file  Social History Narrative   Lives with [name] (her sister).  Widowed (2011).  Son lives in [place], granddaughter lives in [place] (with her mother--gets to see her when she volunteers at [employer] on Saturdays).   Also has step grandson.   She works at the [employer] in [place].   Decreased to 3d/week in 08/2017.     Family History: The patient's family history includes Cancer in her sister; Diabetes in her mother and sister; Heart disease in her maternal grandmother; Lung cancer (age of onset: 30) in her father; Ovarian cancer (age of onset: 44) in her sister; Ovarian cancer (age of onset: 58) in her maternal aunt; Prostate cancer in her cousin; Seizures in her sister; Stroke in her mother; Stroke (age of onset: 53) in her sister. There is no history of Breast cancer. ROS:  Review of Systems  Constitution: Negative.  HENT: Negative.   Eyes: Negative.   Cardiovascular: Negative.   Respiratory: Positive for cough, shortness of breath and sputum production.   Endocrine: Negative.   Hematologic/Lymphatic: Negative.   Skin: Negative.   Musculoskeletal: Negative.   Gastrointestinal: Negative.   Genitourinary: Negative.   Neurological: Negative.   Psychiatric/Behavioral: Negative.   Allergic/Immunologic: Negative.    Please see the history of present illness.    All other systems reviewed and are negative.  EKGs/Labs/Other Studies Reviewed:    The following studies were reviewed today:  EKG:  EKG ordered today.  The ekg ordered today demonstrates [place] 1 APC otherwise normal  Recent Labs: 08/19/2018: ALT 10 09/30/2018: BUN 17; Creatinine, Ser 0.57; Hemoglobin 12.3; Platelets 317.0; Potassium 3.8; Sodium 141  Recent Lipid Panel    Component Value Date/Time   CHOL 147 08/19/2018 1142   TRIG 45 08/19/2018 1142   HDL 72 08/19/2018 1142   CHOLHDL 2.0 08/19/2018 1142   CHOLHDL 1.7  09/01/2017 1307   VLDL 9 12/30/2016 0955   LDLCALC 66 08/19/2018 1142   LDLCALC 51 09/01/2017 1307    Physical Exam:    VS:  BP 136/72 (BP Location: Right Arm, Patient Position: Sitting, Cuff Size: Normal)   Pulse 82   Ht '5\' 9"'  (1.753 m)  Wt 146 lb 6.4 oz (66.4 kg)   SpO2 92%   BMI 21.62 kg/m     Wt Readings from Last 3 Encounters:  02/09/19 146 lb 6.4 oz (66.4 kg)  12/29/18 143 lb 3.2 oz (65 kg)  11/19/18 140 lb 9.6 oz (63.8 kg)     GEN: Does not appear chronically ill or debilitated well nourished, well developed in no acute distress HEENT: Normal NECK: No JVD; No carotid bruits LYMPHATICS: No lymphadenopathy CARDIAC: RRR, no murmurs, rubs, gallops RESPIRATORY: Diffusely diminished breath sounds without rales, wheezing or rhonchi  ABDOMEN: Soft, non-tender, non-distended MUSCULOSKELETAL:  No edema; No deformity  SKIN: Warm and dry NEUROLOGIC:  Alert and oriented x 3 PSYCHIATRIC:  Normal affect    Signed, Shirlee More, MD  02/09/2019 1:39 PM    [facility]

## 2019-02-09 ENCOUNTER — Encounter: Payer: Self-pay | Admitting: Cardiology

## 2019-02-09 ENCOUNTER — Ambulatory Visit: Payer: Medicare Other | Admitting: Cardiology

## 2019-02-09 VITALS — BP 136/72 | HR 82 | Ht 69.0 in | Wt 146.4 lb

## 2019-02-09 DIAGNOSIS — I2510 Atherosclerotic heart disease of native coronary artery without angina pectoris: Secondary | ICD-10-CM | POA: Diagnosis not present

## 2019-02-09 DIAGNOSIS — J449 Chronic obstructive pulmonary disease, unspecified: Secondary | ICD-10-CM | POA: Diagnosis not present

## 2019-02-09 DIAGNOSIS — J471 Bronchiectasis with (acute) exacerbation: Secondary | ICD-10-CM

## 2019-02-09 DIAGNOSIS — E785 Hyperlipidemia, unspecified: Secondary | ICD-10-CM | POA: Diagnosis not present

## 2019-02-09 NOTE — Patient Instructions (Addendum)
Medication Instructions:  Your physician recommends that you continue on your current medications as directed. Please refer to the Current Medication list given to you today.  If you need a refill on your cardiac medications before your next appointment, please call your pharmacy.   Lab work: NONE  Testing/Procedures: An EKG was performed today.  Follow-Up: At [facility], you and your health needs are our priority.  As part of our continuing mission to provide you with exceptional heart care, we have created designated Provider Care Teams.  These Care Teams include your primary Cardiologist (physician) and Advanced Practice Providers (APPs -  Physician Assistants and Nurse Practitioners) who all work together to provide you with the care you need, when you need it.  PLEASE call our office if your symptoms reoccur or worsen to schedule a visit with Dr. Bettina Gavia.

## 2019-02-24 ENCOUNTER — Encounter: Payer: Self-pay | Admitting: Family Medicine

## 2019-02-24 ENCOUNTER — Other Ambulatory Visit: Payer: Self-pay

## 2019-02-24 ENCOUNTER — Ambulatory Visit (INDEPENDENT_AMBULATORY_CARE_PROVIDER_SITE_OTHER): Payer: Medicare Other | Admitting: Family Medicine

## 2019-02-24 VITALS — BP 140/70 | HR 79 | Temp 97.6°F | Resp 16 | Wt 147.6 lb

## 2019-02-24 DIAGNOSIS — N3000 Acute cystitis without hematuria: Secondary | ICD-10-CM | POA: Diagnosis not present

## 2019-02-24 DIAGNOSIS — R350 Frequency of micturition: Secondary | ICD-10-CM

## 2019-02-24 DIAGNOSIS — M545 Low back pain, unspecified: Secondary | ICD-10-CM

## 2019-02-24 LAB — POCT URINALYSIS DIP (PROADVANTAGE DEVICE)
Bilirubin, UA: NEGATIVE
Blood, UA: NEGATIVE
Glucose, UA: NEGATIVE mg/dL
Ketones, POC UA: NEGATIVE mg/dL
Leukocytes, UA: NEGATIVE
Nitrite, UA: NEGATIVE
Protein Ur, POC: NEGATIVE mg/dL
Specific Gravity, Urine: 1.015
Urobilinogen, Ur: NEGATIVE
pH, UA: 6.5 (ref 5.0–8.0)

## 2019-02-24 MED ORDER — SULFAMETHOXAZOLE-TRIMETHOPRIM 800-160 MG PO TABS: 1.0000 | ORAL_TABLET | Freq: Two times a day (BID) | ORAL | 0 refills | Status: DC

## 2019-02-24 NOTE — Progress Notes (Signed)
Chief Complaint  Patient presents with  . UTI    UTI- pain in abdominal, frequency, low back pain, 2 weeks    Subjective:  Molly Ross is a 78 y.o. female who complains of possible urinary tract infection.  She has had intermittent symptoms for 2 weeks.  Symptoms include Urinary frequency, suprapubic pressure, dull low backache. Patient denies fever, chills, dizziness, chest pain, shortness of breath, cough, N/V/D. Marland Kitchen  Last UTI was 2 years ago.    Using nothing for current symptoms.    Patient does not have a history of recurrent UTI. Patient does not have a history of pyelonephritis.  No other aggravating or relieving factors.  No other c/o.  Past Medical History:  Diagnosis Date  . Atherosclerosis of aorta ([place]) 02/02/2015   Noted on imaging   . Bronchiectasis ([place])   . Bronchiectasis with (acute) exacerbation ([place]) 06/09/2015  . CAD (coronary artery disease), native coronary artery    Coronary calcification noted on CT scan 2016  Negative treadmill myoview 07/20/15 with EF 76%   . Cancer ([place])   . Cellulitis of left breast 10/01/2018  . Colon polyp   . COPD, moderate ([place] 11/06/2015  . Depressive disorder, not elsewhere classified 12/04/2011  . Dyspnea   . Emphysema of lung ([place]) 02/02/2015  . Family history of lung cancer   . Family history of ovarian cancer   . Family history of prostate cancer   . Genetic testing 03/27/2018   The Common Hereditary Cancer Panel offered by [facility] includes sequencing and/or deletion duplication testing of the following 47 genes: APC, ATM, AXIN2, BARD1, BMPR1A, BRCA1, BRCA2, BRIP1, CDH1, CDKN2A (p14ARF), CDKN2A (p16INK4a), CKD4, CHEK2, CTNNA1, DICER1, EPCAM (Deletion/duplication testing only), GREM1 (promoter region deletion/duplication testing only), KIT, MEN1, MLH1, MSH2, MSH3, MSH6, MU  . GERD (gastroesophageal reflux disease)   . Hiatal hernia 02/02/2015   small, noted on CT of chest   . History of radiation therapy 04/16/18- 05/14/18   42.56 Gy  directed to the Left Breast in 16 fractions followed by a boost of 8 Gy in 4 fractions  . History of smoking 25-50 pack years 06/09/2015  . Malignant neoplasm of lower-inner quadrant of left breast in female, estrogen receptor positive ([place]) 03/06/2018  . Osteopenia 02/18/2018   DEXA 02/2018, T-1.8 R femoral neck  . Pleuritic pain 10/01/2018  . Shingles 1/09 and 07/2010  . Unspecified vitamin D deficiency 02/2011 and 2016  . Vitamin D deficiency 02/14/2011    ROS as in subjective  Reviewed allergies, medications, past medical, surgical, and social history.    Objective: Vitals:   02/24/19 1359  BP: 140/70  Pulse: 79  Resp: 16  Temp: 97.6 F (36.4 C)  SpO2: 98%    General appearance: alert, no distress, WD/WN, female Abdomen: +bs, soft, mildly tender in the suprapubic region without rebound or referred pain, non distended, no palpable masses Back: no CVA tenderness GU: declines     Laboratory:  Urine dipstick: negative for all components.       Assessment: Acute cystitis without hematuria - Plan: Urine Culture, sulfamethoxazole-trimethoprim (BACTRIM DS,SEPTRA DS) 800-160 MG tablet  Low back pain without sciatica, unspecified back pain laterality, unspecified chronicity - Plan: POCT Urinalysis DIP (Proadvantage Device)  Urinary frequency - Plan: Urine Culture    Plan: Discussed symptoms, diagnosis, possible complications, and usual course of illness.  Bactrim prescribed.  advised increased water intake, can use OTC Tylenol for pain.        Urine culture sent.  Call or return if worse or not improving.

## 2019-02-24 NOTE — Patient Instructions (Addendum)
Take the antibiotic as prescribed.  Stay well-hydrated. If you are not back to baseline after completing the antibiotic then let us know.   Urinary Tract Infection, Adult A urinary tract infection (UTI) is an infection of any part of the urinary tract. The urinary tract includes:  The kidneys.  The ureters.  The bladder.  The urethra. These organs make, store, and get rid of pee (urine) in the body. What are the causes? This is caused by germs (bacteria) in your genital area. These germs grow and cause swelling (inflammation) of your urinary tract. What increases the risk? You are more likely to develop this condition if:  You have a small, thin tube (catheter) to drain pee.  You cannot control when you pee or poop (incontinence).  You are female, and: ? You use these methods to prevent pregnancy: ? A medicine that kills sperm (spermicide). ? A device that blocks sperm (diaphragm). ? You have low levels of a female hormone (estrogen). ? You are pregnant.  You have genes that add to your risk.  You are sexually active.  You take antibiotic medicines.  You have trouble peeing because of: ? A prostate that is bigger than normal, if you are female. ? A blockage in the part of your body that drains pee from the bladder (urethra). ? A kidney stone. ? A nerve condition that affects your bladder (neurogenic bladder). ? Not getting enough to drink. ? Not peeing often enough.  You have other conditions, such as: ? Diabetes. ? A weak disease-fighting system (immune system). ? Sickle cell disease. ? Gout. ? Injury of the spine. What are the signs or symptoms? Symptoms of this condition include:  Needing to pee right away (urgently).  Peeing often.  Peeing small amounts often.  Pain or burning when peeing.  Blood in the pee.  Pee that smells bad or not like normal.  Trouble peeing.  Pee that is cloudy.  Fluid coming from the vagina, if you are female.  Pain in  the belly or lower back. Other symptoms include:  Throwing up (vomiting).  No urge to eat.  Feeling mixed up (confused).  Being tired and grouchy (irritable).  A fever.  Watery poop (diarrhea). How is this treated? This condition may be treated with:  Antibiotic medicine.  Other medicines.  Drinking enough water. Follow these instructions at home:  Medicines  Take over-the-counter and prescription medicines only as told by your doctor.  If you were prescribed an antibiotic medicine, take it as told by your doctor. Do not stop taking it even if you start to feel better. General instructions  Make sure you: ? Pee until your bladder is empty. ? Do not hold pee for a long time. ? Empty your bladder after sex. ? Wipe from front to back after pooping if you are a female. Use each tissue one time when you wipe.  Drink enough fluid to keep your pee pale yellow.  Keep all follow-up visits as told by your doctor. This is important. Contact a doctor if:  You do not get better after 1-2 days.  Your symptoms go away and then come back. Get help right away if:  You have very bad back pain.  You have very bad pain in your lower belly.  You have a fever.  You are sick to your stomach (nauseous).  You are throwing up. Summary  A urinary tract infection (UTI) is an infection of any part of the urinary tract.  This condition is caused by germs in your genital area.  There are many risk factors for a UTI. These include having a small, thin tube to drain pee and not being able to control when you pee or poop.  Treatment includes antibiotic medicines for germs.  Drink enough fluid to keep your pee pale yellow. This information is not intended to replace advice given to you by your health care provider. Make sure you discuss any questions you have with your health care provider. Document Released: 05/20/2008 Document Revised: 06/11/2018 Document Reviewed: 06/11/2018  Elsevier Interactive Patient Education  2019 [employer].

## 2019-02-25 LAB — URINE CULTURE: ORGANISM ID, BACTERIA: NO GROWTH

## 2019-03-03 ENCOUNTER — Other Ambulatory Visit: Payer: Self-pay

## 2019-03-03 ENCOUNTER — Ambulatory Visit
Admission: RE | Admit: 2019-03-03 | Discharge: 2019-03-03 | Disposition: A | Discharge status: Home / Self Care | Payer: Medicare Other | Source: Ambulatory Visit | Attending: Adult Health | Admitting: Adult Health

## 2019-03-03 DIAGNOSIS — R922 Inconclusive mammogram: Secondary | ICD-10-CM | POA: Diagnosis not present

## 2019-03-03 DIAGNOSIS — Z170 Estrogen receptor positive status [ER+]: Principal | ICD-10-CM

## 2019-03-03 DIAGNOSIS — C50312 Malignant neoplasm of lower-inner quadrant of left female breast: Secondary | ICD-10-CM

## 2019-03-05 ENCOUNTER — Inpatient Hospital Stay: Admission: RE | Admit: 2019-03-05 | Payer: Medicare Other | Source: Ambulatory Visit

## 2019-03-09 ENCOUNTER — Telehealth: Payer: Self-pay | Admitting: Internal Medicine

## 2019-03-09 ENCOUNTER — Inpatient Hospital Stay: Payer: Medicare Other | Admitting: Hematology and Oncology

## 2019-03-09 NOTE — Assessment & Plan Note (Deleted)
03/19/18:Left Lumpectomy: IDC Grade 1, 1.3 cm, 1/3 LN Positive, with LVI, T1cN1 ER 100%, PR 90%, Ki-67 5%, HER-2 negative Stage 1B Adjuvant radiation therapy 04/16/2018 to 05/14/2018  Treatment plan: letrozole 2.5 mg daily times 5-7 years Letrozole toxicities:  Breast cancer surveillance: 03/03/2019 mammogram: Benign breast density category C  Return to clinic in 1 year for follow-up

## 2019-03-10 MED ORDER — PREDNISONE 10 MG PO TABS: ORAL_TABLET | ORAL | 0 refills | Status: DC

## 2019-03-10 MED ORDER — DOXYCYCLINE HYCLATE 100 MG PO TABS: 100.0000 mg | ORAL_TABLET | Freq: Two times a day (BID) | ORAL | 0 refills | Status: AC

## 2019-03-10 NOTE — Telephone Encounter (Signed)
tlking noDoes NOT sound like covid Sounds like allergy or some other virus mediated AECOPD  Plan - Please take prednisone 40 mg x1 day, then 30 mg x1 day, then 20 mg x1 day, then 10 mg x1 day, and then 5 mg x1 day and stop  - Take doxycycline 100mg  po twice daily x 5 days; take after meals and avoid sunlight   - isolate for a week   Thanks  MR

## 2019-03-10 NOTE — Telephone Encounter (Signed)
Spoke with pt. She is aware of MR's recommendations. Rxs have been sent in. Nothing further was needed. 

## 2019-03-10 NOTE — Telephone Encounter (Signed)
Primary Pulmonologist: Dr. Chase Caller Last office visit and with whom: 12/29/18 with Dr. Chase Caller What do we see them for (pulmonary problems): COPD bronchiectasis  Reason for call: patient called in with complaints of pain in her lungs when her bronchiectasis flares up. Cough with production of green mucus. Denies fever, no body aches and no chills. No sneezing. Patient reports no SOB.  In the last month, have you been in contact with someone who was confirmed or suspected to have Conoravirus / COVID-19?  Patient reports no Have you traveled internationally or to an area with more than 100 reported cases of Coronavirus / COVID-19?   Patient reports no  Do you have any of the following symptoms developed in the last 30 days? Fever: no Cough: yes green mucus production  Shortness of breath: no  When did your symptoms start?  03/09/19 early morning  If the patient has a fever, what is the last reading?  (use n/a if patient denies fever)  N/A . IF THE PATIENT STATES THEY DO NOT OWN A THERMOMETER, THEY MUST GO AND PURCHASE ONE When did the fever start?: N/A Have you taken any medication to suppress a fever (ie Ibuprofen, Aleve, Tylenol)?: no  TRIAGE :: REMIND PATIENT TO SELF-ISOLATE WHILE THEY'RE MESSAGE IS BEING HANDLED  (examples of things to ask: : When did symptoms start? Fever? Cough? Productive? Color to sputum? More sputum than usual? Wheezing? Are you having any chest pain?  Have you needed increased oxygen? Are you taking your respiratory medications? What over the counter measures have you tried?)

## 2019-04-06 ENCOUNTER — Encounter: Payer: Self-pay | Admitting: Family Medicine

## 2019-04-06 NOTE — Patient Instructions (Addendum)
  Ms. Glauser , Thank you for taking time to come for your Medicare Wellness Visit. I appreciate your ongoing commitment to your health goals. Please review the following plan we discussed and let me know if I can assist you in the future.   This is a list of the screening recommended for you and due dates:  Health Maintenance  Topic Date Due  . Flu Shot  07/17/2019  . Colon Cancer Screening  11/11/2020  . Tetanus Vaccine  09/04/2027  . DEXA scan (bone density measurement)  Completed  . Pneumonia vaccines  Completed   Bone density test will be due for repeat 02/2020 (2 year follow-up, due to osteopenia in the hip noted on last study in 2019).  Schedule that along with your mammogram (same day, 02/2020).  Continue yearly high dose flu shots in the Fall (ignore August date above).  I recommend getting the new shingles vaccine (Shingrix). You will need to get it from the pharmacy rather than our office, due to it being covered by Medicare Part D (pharmacy benefit).  It is a series of 2 injections, spaced 2 months apart.  Increase your exercise to 30 minutes daily (can be in 10-15 minute intervals).

## 2019-04-06 NOTE — Progress Notes (Signed)
Start time: 8:50 End time: 9:30  Virtual Visit via Video Note  I connected with Molly Ross on 04/07/2019  by a video enabled telemedicine application and verified that I am speaking with the correct person using two identifiers. Patient is home in a room by herself (her sister and her caregiver are in another room.) MD is in office.   I discussed the limitations of evaluation and management by telemedicine and the availability of in person appointments. The patient expressed understanding and agreed to proceed. She is aware that this visit will be billed to insurance and will be responsible.  History of Present Illness:  Chief Complaint  Patient presents with  . Medicare Wellness    VIRTUAL AWV, patient has no concerns.    CAD: She was noted to have coronary calcification in 2 vessels on CT scan in 2016.She was referred to Dr. Wynonia Lawman and had a myocardial perfusion scan. She had no evidence of ischemia with an ejection fraction of 76%. She has not had any recurrent chest pain since then. She has been compliant with taking aspirin and statin, and denies side effects.  She is doing well on '20mg'$  atorvastatin (had some issues with discomfort in leg on the initial '40mg'$  dose). She follows a lowfat, low cholesterol diet. She recently saw Dr. Bettina Gavia (01/2019), referred back after again seeing calcifications on chest CT (she forgot she had been sent to cardiology for this in 2016). Per Dr. Joya Gaskins note, she deferred another ischemia evaluation, not having angina.  She continues to be short of breath for more than walking a short distance indoors, unchanged, related to her COPD. Lab Results  Component Value Date   CHOL 147 08/19/2018   HDL 72 08/19/2018   LDLCALC 66 08/19/2018   TRIG 45 08/19/2018   CHOLHDL 2.0 08/19/2018   Emphysema and bronchiectasis: Under the care of pulmonologist.  She had an exacerbation last month requiring prednisone taper and 5 days of doxycycline. She continues on  Breo, Spiriva Respimat, flutter valve and hypertonic saline nebulizers. She feels like she is at her baseline.   H/o Vitamin D deficiency: s/p 12 weeks of rx replacement in February 2016. Last vitamin D level was 52.2 in 12/2017. She continues on the same supplements (MVI and Ca+D  (plain Ca in the morning, Ca+D in the evening,and Vit D 1000 IU).   Left breast cancer 02/2018, s/p lumpectomy and radiation. She is on letrozole and doing well, denies side side effects.  GERD/HH:  Doing well on Prilosec OTC.  Denies dysphagia.  Immunization History  Administered Date(s) Administered  . Influenza, High Dose Seasonal PF 01/26/2014, 10/19/2014, 08/30/2015, 08/08/2016, 08/05/2017, 09/02/2018  . Pneumococcal Conjugate-13 12/25/2015  . Pneumococcal Polysaccharide-23 12/30/2016  . Tdap 09/03/2017   Last Pap smear:12/2015, normal, no high risk HPV detected Last mammogram: 02/2019 Last colonoscopy: 10/2017; 7 tubular adenomas found, and diverticulosis.  Repeat due 10/2020 (3 yr f/u). Last DEXA: 02/2018, T-1.8 at R fem neck. FRAX: Major Osteoporotic Fracture: 11.8%, Hip Fracture: 3.1%. Repeat 02/2020 Dentist:twiceyearly Ophtho:yearly Exercise: Walks up and down the driveway for 15 minutes once daily. Does some weight-bearing exercise for upper body (body weight--pushing self up on chair).  Other doctors caring for patient include: Pulmonary: Dr. Chase Caller Cardiology: Dr. Vladimir Faster Dentist:Dr. Joelyn Oms Cates(in [place]) GI: Dr. Hoy Morn (at [place]) Ophtho: Dr. Teodoro Spray, Dr. Alanda Slim did her cataract surgery Oncologist: Dr. Lindi Adie Radiation Oncologist: Dr. Eppie Gibson Surgeon: Dr. Barry Dienes  Depression screen: Negative.  Fall Screen: negative Functional Status screen: notable  only forsome leakage of urine (with cough/sneeze). Mini-Cog screen: normal (clock drawing shown through video) See full questionnaires in epic.  End of Life Discussion: Patient hasa living will and  medical power of attorney (scanned 03/2018).  Past Medical History:  Diagnosis Date  . Atherosclerosis of aorta ([place]) 02/02/2015   Noted on imaging   . Bronchiectasis ([place])   . Bronchiectasis with (acute) exacerbation ([place]) 06/09/2015  . CAD (coronary artery disease), native coronary artery    Coronary calcification noted on CT scan 2016  Negative treadmill myoview 07/20/15 with EF 76%   . Cancer ([place])   . Cellulitis of left breast 10/01/2018  . Colon polyp   . COPD, moderate ([place]) 11/06/2015  . Depressive disorder, not elsewhere classified 12/04/2011  . Dyspnea   . Emphysema of lung ([place]) 02/02/2015  . Family history of lung cancer   . Family history of ovarian cancer   . Family history of prostate cancer   . Genetic testing 03/27/2018   The Common Hereditary Cancer Panel offered by [facility] includes sequencing and/or deletion duplication testing of the following 47 genes: APC, ATM, AXIN2, BARD1, BMPR1A, BRCA1, BRCA2, BRIP1, CDH1, CDKN2A (p14ARF), CDKN2A (p16INK4a), CKD4, CHEK2, CTNNA1, DICER1, EPCAM (Deletion/duplication testing only), GREM1 (promoter region deletion/duplication testing only), KIT, MEN1, MLH1, MSH2, MSH3, MSH6, MU  . GERD (gastroesophageal reflux disease)   . Hiatal hernia 02/02/2015   small, noted on CT of chest   . History of radiation therapy 04/16/18- 05/14/18   42.56 Gy directed to the Left Breast in 16 fractions followed by a boost of 8 Gy in 4 fractions  . History of smoking 25-50 pack years 06/09/2015  . Malignant neoplasm of lower-inner quadrant of left breast in female, estrogen receptor positive ([place]) 03/06/2018  . Osteopenia 02/18/2018   DEXA 02/2018, T-1.8 R femoral neck  . Pleuritic pain 10/01/2018  . Shingles 1/09, 07/2010, 11/2017  . Unspecified vitamin D deficiency 02/2011 and 2016  . Vitamin D deficiency 02/14/2011    Past Surgical History:  Procedure Laterality Date  . BREAST LUMPECTOMY WITH RADIOACTIVE SEED AND SENTINEL LYMPH NODE BIOPSY Left 03/19/2018    Procedure: BREAST LUMPECTOMY WITH RADIOACTIVE SEED AND SENTINEL LYMPH NODE BIOPSY;  Surgeon: Stark Klein, MD;  Location: [facility];  Service: General;  Laterality: Left;  . CATARACT EXTRACTION, BILATERAL Bilateral 03/2017, 04/2017   [facility], Dr. Alanda Slim  . SHOULDER SURGERY  03/2011   for frozen shoulder, and cyst removal (Dr. Percell Miller)  . TONSILLECTOMY  child  . VIDEO BRONCHOSCOPY Bilateral 06/21/2016   Procedure: VIDEO BRONCHOSCOPY WITHOUT FLUORO;  Surgeon: Brand Males, MD;  Location: [facility];  Service: Cardiopulmonary;  Laterality: Bilateral;    Social History   Socioeconomic History  . Marital status: Widowed    Spouse name: Not on file  . Number of children: Not on file  . Years of education: Not on file  . Highest education level: Not on file  Occupational History  . Occupation: [employer]  Social Needs  . Financial resource strain: Not on file  . Food insecurity:    Worry: Not on file    Inability: Not on file  . Transportation needs:    Medical: Not on file    Non-medical: Not on file  Tobacco Use  . Smoking status: Former Smoker    Packs/day: 2.00    Years: 24.00    Pack years: 48.00    Types: Cigarettes    Last attempt to quit: 06/28/1980  Years since quitting: 38.8  . Smokeless tobacco: Never Used  . Tobacco comment: smoked 1-2 PPD x 24 years;  Substance and Sexual Activity  . Alcohol use: Yes    Alcohol/week: 0.0 standard drinks    Comment: 1 glass of wine once a week or less  . Drug use: No  . Sexual activity: Not Currently  Lifestyle  . Physical activity:    Days per week: Not on file    Minutes per session: Not on file  . Stress: Not on file  Relationships  . Social connections:    Talks on phone: Not on file    Gets together: Not on file    Attends religious service: Not on file    Active member of club or organization: Not on file    Attends meetings of clubs or organizations: Not on file    Relationship  status: Not on file  . Intimate partner violence:    Fear of current or ex partner: Not on file    Emotionally abused: Not on file    Physically abused: Not on file    Forced sexual activity: Not on file  Other Topics Concern  . Not on file  Social History Narrative   Lives with [name] (her sister).  Widowed (2011).  Son and granddaughter live in [place]   Also has step grandson.   She works at the [employer] in [place].   Decreased to 2 days/week. (03/2019--furloughed due to COVID-19 pandemic)    Family History  Problem Relation Age of Onset  . Stroke Mother   . Diabetes Mother   . Stroke Sister 30       brainstem stroke  . Diabetes Sister   . Seizures Sister   . Lung cancer Father 38  . Ovarian cancer Sister 29       metastatic to lung, liver  . Cancer Sister   . Ovarian cancer Maternal Aunt 95  . Heart disease Maternal Grandmother   . Prostate cancer Cousin   . Lung cancer Cousin        recurrent, now stage 4  . Breast cancer Neg Hx     Outpatient Encounter Medications as of 04/07/2019  Medication Sig  . Ascorbic Acid (VITAMIN C) 100 MG tablet Take 1 tablet (100 mg total) by mouth daily.  Marland Kitchen aspirin EC 81 MG tablet Take 81 mg by mouth daily.  Marland Kitchen atorvastatin (LIPITOR) 20 MG tablet TAKE 1 TABLET BY MOUTH EVERY DAY  . Calcium-Magnesium-Vitamin D (CALCIUM 500 PO) Take 1 tablet by mouth daily.  . fluticasone furoate-vilanterol (BREO ELLIPTA) 100-25 MCG/INH AEPB Inhale 1 puff into the lungs daily.  Marland Kitchen letrozole (FEMARA) 2.5 MG tablet Take 1 tablet (2.5 mg total) by mouth daily.  . Multiple Vitamins-Minerals (MULTIVITAMIN WITH MINERALS) tablet Take 1 tablet by mouth daily.  . Tiotropium Bromide Monohydrate (SPIRIVA RESPIMAT) 1.25 MCG/ACT AERS Inhale 2 puffs into the lungs daily.  Marland Kitchen ipratropium-albuterol (DUONEB) 0.5-2.5 (3) MG/3ML SOLN Take 3 mLs by nebulization every 4 (four) hours as needed. (Patient not taking: Reported on 04/07/2019)  . sodium chloride  HYPERTONIC 3 % nebulizer solution Take by nebulization as needed for other. (Patient not taking: Reported on 04/07/2019)  . [DISCONTINUED] predniSONE (DELTASONE) 10 MG tablet 40 mg x1 day, then 30 mg x1 day, then 20 mg x1 day, then 10 mg x1 day, and then 5 mg x1 day and stop  . [DISCONTINUED] sulfamethoxazole-trimethoprim (BACTRIM DS,SEPTRA DS) 800-160 MG tablet Take 1 tablet by  mouth 2 (two) times daily.   No facility-administered encounter medications on file as of 04/07/2019.     No Known Allergies   ROS: The patient denies anorexia, fever, headaches, vision changes, decreased hearing, ear pain, sore throat, breast concerns, chest pain, palpitations, dizziness, syncope, dyspnea on exertion, cough, swelling, nausea, vomiting, diarrhea, constipation, abdominal pain, melena, hematochezia, indigestion/heartburn, hematuria, incontinence(some stress incontinence), dysuria, vaginal bleeding, discharge, odor or itch, genital lesions, joint pains, numbness, tingling, weakness, tremor, suspicious skin lesions, depression, abnormal bleeding/bruising, or enlarged lymph nodes. Breathing is at baseline.  Still coughs some, sometimes productive of clear phlegm, rarely discolored. Some burping after eating, better since she is more careful about what she eats.Denies dysphagia.    Observations/Objective:  BP 129/69   Pulse 69   Ht '5\' 9"'$  (1.753 m)   BMI 21.80 kg/m    Exam is limited due to the virtual nature of the visit. She is alert, oriented and in good spirits. Cranial nerves are grossly intact. Mood is good, normal affect, grooming.  Assessment and Plan:  Medicare annual wellness visit, subsequent  Coronary artery disease involving native coronary artery of native heart without angina pectoris - continue aspirin, statin - Plan: Lipid panel  Osteopenia of neck of right femur - discussed Ca, D, weight-bearing exercise. Recheck DEXA next year  Malignant neoplasm of lower-inner quadrant of left  breast in female, estrogen receptor positive ([place]) - had to reschedule oncology visit, UTD on mammo and tolerating letrozole - Plan: CBC with Differential/Platelet, Comprehensive metabolic panel  COPD, moderate (HCC) - stable  Coronary artery calcification seen on CT scan  Atherosclerosis of aorta (HCC) - continue aspirin, statin - Plan: atorvastatin (LIPITOR) 20 MG tablet, Lipid panel  Bronchiectasis with (acute) exacerbation (HCC) - recent exacerbation, now back to baseline  Medication monitoring encounter - Plan: CBC with Differential/Platelet, Lipid panel, Comprehensive metabolic panel   Follow up at [facility]PE in September with labs prior. CBC, c-met, lipids  MOST form wasreviewed unchanged (unable to sign due to virtual nature of visit). Full Code, Full Care   Discussed monthly self breast exams and yearly mammograms; at least 30 minutes of aerobic activity at least 5 days/week and weight-bearing exercise 2x/week; proper sunscreen use reviewed; healthy diet, including goals of calcium and vitamin D intake and alcohol recommendations (less than or equal to 1 drink/day) reviewed; regular seatbelt use; changing batteries in smoke detectors. Immunization recommendations discussed--Shingrix recommended, to get from pharmacy (risks/SE reviewed). Continue yearly high dose flu shots. ColonoscopyUTD, repeat due 10/2020.  DEXA due 02/2020.   Follow Up Instructions:    I discussed the assessment and treatment plan with the patient. The patient was provided an opportunity to ask questions and all were answered. The patient agreed with the plan and demonstrated an understanding of the instructions.   The patient was advised to call back or seek an in-person evaluation if the symptoms worsen or if the condition fails to improve as anticipated.  I provided 40 minutes of non-face-to-face time during this encounter.   Vikki Ports, MD  Medicare Attestation I have personally reviewed: The  patient's medical and social history Their use of alcohol, tobacco or illicit drugs Their current medications and supplements The patient's functional ability including ADLs,fall risks, home safety risks, cognitive, and hearing and visual impairment Diet and physical activities Evidence for depression or mood disorders  The patient's weight, height and BMI have been recorded in the chart.  I have made referrals, counseling, and provided education to the patient  based on review of the above and I have provided the patient with a written personalized care plan for preventive services.

## 2019-04-07 ENCOUNTER — Other Ambulatory Visit: Payer: Self-pay

## 2019-04-07 ENCOUNTER — Encounter: Payer: Self-pay | Admitting: Family Medicine

## 2019-04-07 ENCOUNTER — Ambulatory Visit (INDEPENDENT_AMBULATORY_CARE_PROVIDER_SITE_OTHER): Payer: Medicare Other | Admitting: Family Medicine

## 2019-04-07 VITALS — BP 129/69 | HR 69 | Ht 69.0 in

## 2019-04-07 DIAGNOSIS — J449 Chronic obstructive pulmonary disease, unspecified: Secondary | ICD-10-CM

## 2019-04-07 DIAGNOSIS — M85851 Other specified disorders of bone density and structure, right thigh: Secondary | ICD-10-CM

## 2019-04-07 DIAGNOSIS — I2510 Atherosclerotic heart disease of native coronary artery without angina pectoris: Secondary | ICD-10-CM | POA: Diagnosis not present

## 2019-04-07 DIAGNOSIS — Z0000 Encounter for general adult medical examination without abnormal findings: Secondary | ICD-10-CM

## 2019-04-07 DIAGNOSIS — Z5181 Encounter for therapeutic drug level monitoring: Secondary | ICD-10-CM

## 2019-04-07 DIAGNOSIS — J471 Bronchiectasis with (acute) exacerbation: Secondary | ICD-10-CM

## 2019-04-07 DIAGNOSIS — Z170 Estrogen receptor positive status [ER+]: Secondary | ICD-10-CM

## 2019-04-07 DIAGNOSIS — C50312 Malignant neoplasm of lower-inner quadrant of left female breast: Secondary | ICD-10-CM | POA: Diagnosis not present

## 2019-04-07 DIAGNOSIS — I700 Atherosclerosis of aorta: Secondary | ICD-10-CM

## 2019-04-07 MED ORDER — ATORVASTATIN CALCIUM 20 MG PO TABS: 20.0000 mg | ORAL_TABLET | Freq: Every day | ORAL | 1 refills | Status: DC

## 2019-04-13 ENCOUNTER — Other Ambulatory Visit: Payer: Self-pay | Admitting: Adult Health

## 2019-05-09 ENCOUNTER — Other Ambulatory Visit: Payer: Self-pay | Admitting: Hematology and Oncology

## 2019-05-28 ENCOUNTER — Encounter: Payer: Self-pay | Admitting: Medical

## 2019-05-28 ENCOUNTER — Other Ambulatory Visit: Payer: Self-pay

## 2019-05-28 ENCOUNTER — Ambulatory Visit (INDEPENDENT_AMBULATORY_CARE_PROVIDER_SITE_OTHER): Payer: Medicare Other | Admitting: Medical

## 2019-05-28 VITALS — BP 110/72 | HR 84 | Temp 99.1°F | Ht 69.0 in | Wt 147.4 lb

## 2019-05-28 DIAGNOSIS — R350 Frequency of micturition: Secondary | ICD-10-CM | POA: Diagnosis not present

## 2019-05-28 DIAGNOSIS — M549 Dorsalgia, unspecified: Secondary | ICD-10-CM | POA: Diagnosis not present

## 2019-05-28 DIAGNOSIS — R300 Dysuria: Secondary | ICD-10-CM

## 2019-05-28 DIAGNOSIS — R109 Unspecified abdominal pain: Secondary | ICD-10-CM | POA: Diagnosis not present

## 2019-05-28 LAB — POCT URINALYSIS DIP (PROADVANTAGE DEVICE)
Bilirubin, UA: NEGATIVE
Blood, UA: NEGATIVE
Glucose, UA: NEGATIVE mg/dL
Nitrite, UA: NEGATIVE
Protein Ur, POC: NEGATIVE mg/dL
Specific Gravity, Urine: 1.015
Urobilinogen, Ur: NEGATIVE
pH, UA: 6 (ref 5.0–8.0)

## 2019-05-28 LAB — CBC WITH DIFFERENTIAL/PLATELET
Basophils Absolute: 0.1 10*3/uL (ref 0.0–0.2)
Basos: 1 %
EOS (ABSOLUTE): 0.1 10*3/uL (ref 0.0–0.4)
Eos: 1 %
Hematocrit: 40.5 % (ref 34.0–46.6)
Hemoglobin: 13 g/dL (ref 11.1–15.9)
Immature Grans (Abs): 0 10*3/uL (ref 0.0–0.1)
Immature Granulocytes: 0 %
Lymphocytes Absolute: 1.3 10*3/uL (ref 0.7–3.1)
Lymphs: 16 %
MCH: 29.1 pg (ref 26.6–33.0)
MCHC: 32.1 g/dL (ref 31.5–35.7)
MCV: 91 fL (ref 79–97)
Monocytes Absolute: 0.8 10*3/uL (ref 0.1–0.9)
Monocytes: 9 %
Neutrophils Absolute: 6 10*3/uL (ref 1.4–7.0)
Neutrophils: 73 %
Platelets: 329 10*3/uL (ref 150–450)
RBC: 4.46 x10E6/uL (ref 3.77–5.28)
RDW: 12.8 % (ref 11.7–15.4)
WBC: 8.3 10*3/uL (ref 3.4–10.8)

## 2019-05-28 MED ORDER — HYDROCODONE-ACETAMINOPHEN 5-325 MG PO TABS: 1.0000 | ORAL_TABLET | Freq: Four times a day (QID) | ORAL | 0 refills | Status: DC | PRN

## 2019-05-28 MED ORDER — NITROFURANTOIN MONOHYD MACRO 100 MG PO CAPS: 100.0000 mg | ORAL_CAPSULE | Freq: Two times a day (BID) | ORAL | 0 refills | Status: DC

## 2019-05-28 NOTE — Progress Notes (Signed)
Subjective: Chief Complaint  Patient presents with  . Urinary Frequency    with lower back pain, pelvic pressure x3 days    Here for possible urinary infection.   She reports 3 days hx/o mid back pain and pelvic pressure.  No burning with urination but is having urinary frequency.  No rash.  No cough or respiratory symptoms other than some cough with her underlying bronchiectasis that is not different than usual in recent days.  No fever, no nausea, no vomiting.  No blood in urine.   No bowel changes, no diarrhea.   No vaginal symptoms.   She feels like she is drinking a good amount of water.   No recent physical activity to cause back or belly pain.  No fall, no injury, no trauma.   Used some tylenol yesterday and drank a lot of cranberry juice to help.   Last UTI over a year ago.  No prior hospitalization for urinary tract infection.  No other aggravating or relieving factors. No other complaint.  Past Medical History:  Diagnosis Date  . Atherosclerosis of aorta ([place]) 02/02/2015   Noted on imaging   . Bronchiectasis ([place])   . Bronchiectasis with (acute) exacerbation ([place]) 06/09/2015  . CAD (coronary artery disease), native coronary artery    Coronary calcification noted on CT scan 2016  Negative treadmill myoview 07/20/15 with EF 76%   . Cancer ([place])   . Cellulitis of left breast 10/01/2018  . Colon polyp   . COPD, moderate ([place]) 11/06/2015  . Depressive disorder, not elsewhere classified 12/04/2011  . Dyspnea   . Emphysema of lung ([place]) 02/02/2015  . Family history of lung cancer   . Family history of ovarian cancer   . Family history of prostate cancer   . Genetic testing 03/27/2018   The Common Hereditary Cancer Panel offered by [facility] includes sequencing and/or deletion duplication testing of the following 47 genes: APC, ATM, AXIN2, BARD1, BMPR1A, BRCA1, BRCA2, BRIP1, CDH1, CDKN2A (p14ARF), CDKN2A (p16INK4a), CKD4, CHEK2, CTNNA1, DICER1, EPCAM (Deletion/duplication testing only), GREM1  (promoter region deletion/duplication testing only), KIT, MEN1, MLH1, MSH2, MSH3, MSH6, MU  . GERD (gastroesophageal reflux disease)   . Hiatal hernia 02/02/2015   small, noted on CT of chest   . History of radiation therapy 04/16/18- 05/14/18   42.56 Gy directed to the Left Breast in 16 fractions followed by a boost of 8 Gy in 4 fractions  . History of smoking 25-50 pack years 06/09/2015  . Malignant neoplasm of lower-inner quadrant of left breast in female, estrogen receptor positive ([place]) 03/06/2018  . Osteopenia 02/18/2018   DEXA 02/2018, T-1.8 R femoral neck  . Pleuritic pain 10/01/2018  . Shingles 1/09, 07/2010, 11/2017  . Unspecified vitamin D deficiency 02/2011 and 2016  . Vitamin D deficiency 02/14/2011   Current Outpatient Medications on File Prior to Visit  Medication Sig Dispense Refill  . Ascorbic Acid (VITAMIN C) 100 MG tablet Take 1 tablet (100 mg total) by mouth daily.    Marland Kitchen aspirin EC 81 MG tablet Take 81 mg by mouth daily.    Marland Kitchen atorvastatin (LIPITOR) 20 MG tablet Take 1 tablet (20 mg total) by mouth daily. 90 tablet 1  . BREO ELLIPTA 100-25 MCG/INH AEPB TAKE 1 PUFF BY MOUTH EVERY DAY 60 each 3  . Calcium-Magnesium-Vitamin D (CALCIUM 500 PO) Take 1 tablet by mouth daily.    Marland Kitchen ipratropium-albuterol (DUONEB) 0.5-2.5 (3) MG/3ML SOLN Take 3 mLs by nebulization every 4 (four) hours as needed. [place]  mL 2  . letrozole (FEMARA) 2.5 MG tablet TAKE 1 TABLET BY MOUTH EVERY DAY 90 tablet 0  . Multiple Vitamins-Minerals (MULTIVITAMIN WITH MINERALS) tablet Take 1 tablet by mouth daily.    Marland Kitchen omeprazole (PRILOSEC OTC) 20 MG tablet Take 20 mg by mouth daily.    Marland Kitchen SPIRIVA RESPIMAT 2.5 MCG/ACT AERS TAKE 2 PUFFS BY MOUTH TWICE A DAY 18 g 3  . sodium chloride HYPERTONIC 3 % nebulizer solution Take by nebulization as needed for other. (Patient not taking: Reported on 05/28/2019) 750 mL 2  . Tiotropium Bromide Monohydrate (SPIRIVA RESPIMAT) 1.25 MCG/ACT AERS Inhale 2 puffs into the lungs daily. (Patient not  taking: Reported on 05/28/2019) 2 Inhaler 0   No current facility-administered medications on file prior to visit.    ROS as in subjective    Objective: BP 110/72   Pulse 84   Temp 99.1 F (37.3 C) (Oral)   Ht _0  (1.753 m)   Wt 147 lb 6.4 oz (66.9 kg)   SpO2 98%   BMI 21.77 kg/m   Wt Readings from Last 3 Encounters:  05/28/19 147 lb 6.4 oz (66.9 kg)  02/24/19 147 lb 9.6 oz (67 kg)  02/09/19 146 lb 6.4 oz (66.4 kg)   Gen: wd, wn, nad Mild tenderness over suprapubic region and mild tenderness over right low back region otherwise back and abdomen nontender, no mass, no organomegaly Back ROM relatively full but mild tenderness when she was coming back up to full back extension extremities non tender     Assessment: Encounter Diagnoses  Name Primary?  . Frequent urination Yes  . Dysuria   . Acute right-sided back pain, unspecified back location   . Flank pain      Plan: We discussed differential.   Possible UTI, but she has mild tenderness with motion so can't rule out musculoskeletal back pain. Discussed symptoms of kidney stone.  Discussed other differential.   We will empirically treat for UTI, will send for CBC and culture, pain medication sparingly as below.   Discussed if new or much worse symptoms over the weekend to go to the emergency dept, or call after hours line if questions.   F/u pending studies.   Elnita was seen today for urinary frequency.  Diagnoses and all orders for this visit:  Frequent urination -     POCT Urinalysis DIP (Proadvantage Device) -     Urine Culture -     CBC with Differential/Platelet  Dysuria -     Urine Culture -     CBC with Differential/Platelet  Acute right-sided back pain, unspecified back location -     Urine Culture -     CBC with Differential/Platelet  Flank pain -     Urine Culture -     CBC with Differential/Platelet  Other orders -     nitrofurantoin, macrocrystal-monohydrate, (MACROBID) 100 MG capsule;  Take 1 capsule (100 mg total) by mouth 2 (two) times daily. -     HYDROcodone-acetaminophen (NORCO) 5-325 MG tablet; Take 1 tablet by mouth every 6 (six) hours as needed for moderate pain.

## 2019-05-29 LAB — URINE CULTURE

## 2019-06-09 ENCOUNTER — Ambulatory Visit (INDEPENDENT_AMBULATORY_CARE_PROVIDER_SITE_OTHER): Payer: Medicare Other | Admitting: Family Medicine

## 2019-06-09 ENCOUNTER — Ambulatory Visit
Admission: RE | Admit: 2019-06-09 | Discharge: 2019-06-09 | Disposition: A | Discharge status: Home / Self Care | Payer: Medicare Other | Source: Ambulatory Visit | Attending: Family Medicine | Admitting: Family Medicine

## 2019-06-09 ENCOUNTER — Other Ambulatory Visit: Payer: Self-pay

## 2019-06-09 ENCOUNTER — Encounter: Payer: Self-pay | Admitting: Family Medicine

## 2019-06-09 VITALS — BP 120/64 | HR 68 | Temp 98.6°F | Ht 69.0 in | Wt 147.4 lb

## 2019-06-09 DIAGNOSIS — M545 Low back pain, unspecified: Secondary | ICD-10-CM

## 2019-06-09 DIAGNOSIS — M47812 Spondylosis without myelopathy or radiculopathy, cervical region: Secondary | ICD-10-CM | POA: Diagnosis not present

## 2019-06-09 DIAGNOSIS — R350 Frequency of micturition: Secondary | ICD-10-CM

## 2019-06-09 DIAGNOSIS — M62838 Other muscle spasm: Secondary | ICD-10-CM | POA: Diagnosis not present

## 2019-06-09 LAB — POCT URINALYSIS DIP (PROADVANTAGE DEVICE)
Bilirubin, UA: NEGATIVE
Blood, UA: NEGATIVE
Glucose, UA: NEGATIVE mg/dL
Ketones, POC UA: NEGATIVE mg/dL
Nitrite, UA: NEGATIVE
Protein Ur, POC: NEGATIVE mg/dL
Specific Gravity, Urine: 1.01
Urobilinogen, Ur: NEGATIVE
pH, UA: 7 (ref 5.0–8.0)

## 2019-06-09 MED ORDER — METHOCARBAMOL 500 MG PO TABS: 500.0000 mg | ORAL_TABLET | Freq: Three times a day (TID) | ORAL | 0 refills | Status: DC | PRN

## 2019-06-09 MED ORDER — MELOXICAM 15 MG PO TABS: 15.0000 mg | ORAL_TABLET | Freq: Every day | ORAL | 0 refills | Status: DC

## 2019-06-09 NOTE — Progress Notes (Signed)
Chief Complaint  Patient presents with  . Back Pain    lbp x 3 weeks. Saw Shane 2 weeks ago and is no better. Yesterday was terrible. She feels like the pain has worsened.     Seen by Audelia Acton 6/12 with urinary frequency and back and pelvic pressure. She was treated empirically for UTI. Urine culture was negative, CBC was normal. She continues to have urinary frequency--drank a lot of water yesterday, and was up every 2 hours last night.  Denies dysuria.  Doesn't feel any better than when she saw [facility].  She took Aleve back OTC medication (220mg , took 3 tablets twice daily for 2 days). Just started this 2 days ago.  Hasn't had any today. No upper abdominal pain. Has pain with getting back up from a flexed position (from leaning forward).  No pain with leaning backwards. Pain is across the lower back, sharp pain, radiates across the whole lower back. No numbness, tingling, weakness of legs. No radiation of pain down the legs. Hasn't tried heating pad. Couldn't get comfortable last night to sleep.  She does a lot of bending, lifting/twisting related to caring for her sister (pushing wheelchair, etc).  She suffers from constipation, uses Miralax every 1-2 days.  Still has some pelvic pressure. Had a BM today, didn't affect the discomfort. No blood or mucus in the stools. She did not have pelvic exam yet this year (did virtual visit in April instead, and rescheduled for September)  Had pain in right knee last week, ice helped.  PMH, PSH, SH reviewed  Outpatient Encounter Medications as of 06/09/2019  Medication Sig  . Ascorbic Acid (VITAMIN C) 100 MG tablet Take 1 tablet (100 mg total) by mouth daily.  Marland Kitchen aspirin EC 81 MG tablet Take 81 mg by mouth daily.  Marland Kitchen atorvastatin (LIPITOR) 20 MG tablet Take 1 tablet (20 mg total) by mouth daily.  Marland Kitchen BREO ELLIPTA 100-25 MCG/INH AEPB TAKE 1 PUFF BY MOUTH EVERY DAY  . Calcium-Magnesium-Vitamin D (CALCIUM 500 PO) Take 1 tablet by mouth daily.  Marland Kitchen letrozole  (FEMARA) 2.5 MG tablet TAKE 1 TABLET BY MOUTH EVERY DAY  . Multiple Vitamins-Minerals (MULTIVITAMIN WITH MINERALS) tablet Take 1 tablet by mouth daily.  Marland Kitchen omeprazole (PRILOSEC OTC) 20 MG tablet Take 20 mg by mouth daily.  Marland Kitchen SPIRIVA RESPIMAT 2.5 MCG/ACT AERS TAKE 2 PUFFS BY MOUTH TWICE A DAY  . ipratropium-albuterol (DUONEB) 0.5-2.5 (3) MG/3ML SOLN Take 3 mLs by nebulization every 4 (four) hours as needed. (Patient not taking: Reported on 06/09/2019)  . sodium chloride HYPERTONIC 3 % nebulizer solution Take by nebulization as needed for other. (Patient not taking: Reported on 05/28/2019)  . [DISCONTINUED] HYDROcodone-acetaminophen (NORCO) 5-325 MG tablet Take 1 tablet by mouth every 6 (six) hours as needed for moderate pain. (Patient not taking: Reported on 06/09/2019)  . [DISCONTINUED] nitrofurantoin, macrocrystal-monohydrate, (MACROBID) 100 MG capsule Take 1 capsule (100 mg total) by mouth 2 (two) times daily. (Patient not taking: Reported on 06/09/2019)  . [DISCONTINUED] Tiotropium Bromide Monohydrate (SPIRIVA RESPIMAT) 1.25 MCG/ACT AERS Inhale 2 puffs into the lungs daily. (Patient not taking: Reported on 05/28/2019)   No facility-administered encounter medications on file as of 06/09/2019.    No Known Allergies  No fever, chills, URI symptoms, breathing is at baseline/improved since staying at home. Pelvic pressure, urinary frequency and back pain per HPI. No bleeding, bruising, rash. Moods are good. No vaginal bleeding or discharge.   PHYSICAL EXAM: BP 120/64   Pulse 68   Temp  98.6 F (37 C) (Temporal)   Ht 5\' 9"  (1.753 m)   Wt 147 lb 6.4 oz (66.9 kg)   BMI 21.77 kg/m   Well appearing, pleasant female, in moderate distress with certain position changes, appears comfortable at rest. HEENT: conjunctiva and sclera are clear, EOMI. Wearing mask Back: Minimal tenderness over lower lumbar spine Area of discomfort and mild tenderness is bilateral paraspinous muscles in lower lumbar region No  CVA tenderness Heart: regular rate and rhythm Lungs clear bilaterally with fair air movement Abdomen: soft, nondistended, no organomegaly or mass. Tender over suprapubic area.  No rebound or guarding Extremities: no edema Neuro: alert and oriented, normal gait Skin: normal turgor, no rash  Urine dip: trace leuks   ASSESSMENT/PLAN:  Low back pain, unspecified back pain laterality, unspecified chronicity, unspecified whether sciatica present - muscular component; check x-ray bc also has bony tenderness. Heat, stretch, massage, NSAID and muscle relaxant. Consider PT - Plan: POCT Urinalysis DIP (Proadvantage Device), meloxicam (MOBIC) 15 MG tablet, methocarbamol (ROBAXIN) 500 MG tablet, DG Lumbar Spine Complete  Muscle spasm - Plan: methocarbamol (ROBAXIN) 500 MG tablet  Urinary frequency - and pelvic pressure/suprapubic tenderness. recheck culture. If normal, needs to have pelvic exam at f/u visit - Plan: Urine Culture  Risks/side effects of all meds reviewed. She understand treatment plan and next steps.   Robaxin meloxicam XR back Urine culture  F/u 7-10 days; may need pelvic exam if not better  Call for PT if pain not improving prior to that visit.

## 2019-06-09 NOTE — Patient Instructions (Addendum)
Use heat to your low back frequently throughout the day. Gentle massage to the muscles are the heat and back stretches can also be helpful.   STOP using the over-the-counter aleve medication. Instead take the meloxicam once daily with food.  Do not take advil/motrin/ibuprofen/aleve/naproxen/Goody or BC powder You MAY use tylenol if needed for pain.  Use the methocarbamol muscle relaxant as needed for muscle spasm.  Go to [facility] ([address]) to get x-rays of your back.  If your back pain isn't improving in the next week, call us to get started on Physical Therapy.  Return in 7-10 days for recheck (sooner if worse, especially related to your bladder--if you notice blood in the urine, more pain, fever, etc).   Back Exercises If you have pain in your back, do these exercises 2-3 times each day or as told by your doctor. When the pain goes away, do the exercises once each day, but repeat the steps more times for each exercise (do more repetitions). If you do not have pain in your back, do these exercises once each day or as told by your doctor. Exercises Single Knee to Chest Do these steps 3-5 times in a row for each leg: 1. Lie on your back on a firm bed or the floor with your legs stretched out. 2. Bring one knee to your chest. 3. Hold your knee to your chest by grabbing your knee or thigh. 4. Pull on your knee until you feel a gentle stretch in your lower back. 5. Keep doing the stretch for 10-30 seconds. 6. Slowly let go of your leg and straighten it. Pelvic Tilt Do these steps 5-10 times in a row: 1. Lie on your back on a firm bed or the floor with your legs stretched out. 2. Bend your knees so they point up to the ceiling. Your feet should be flat on the floor. 3. Tighten your lower belly (abdomen) muscles to press your lower back against the floor. This will make your tailbone point up to the ceiling instead of pointing down to your feet or the floor. 4. Stay in  this position for 5-10 seconds while you gently tighten your muscles and breathe evenly. Cat-Cow Do these steps until your lower back bends more easily: 1. Get on your hands and knees on a firm surface. Keep your hands under your shoulders, and keep your knees under your hips. You may put padding under your knees. 2. Let your head hang down, and make your tailbone point down to the floor so your lower back is round like the back of a cat. 3. Stay in this position for 5 seconds. 4. Slowly lift your head and make your tailbone point up to the ceiling so your back hangs low (sags) like the back of a cow. 5. Stay in this position for 5 seconds.  Press-Ups Do these steps 5-10 times in a row: 1. Lie on your belly (face-down) on the floor. 2. Place your hands near your head, about shoulder-width apart. 3. While you keep your back relaxed and keep your hips on the floor, slowly straighten your arms to raise the top half of your body and lift your shoulders. Do not use your back muscles. To make yourself more comfortable, you may change where you place your hands. 4. Stay in this position for 5 seconds. 5. Slowly return to lying flat on the floor.  Bridges Do these steps 10 times in a row: 1. Lie on your  back on a firm surface. 2. Bend your knees so they point up to the ceiling. Your feet should be flat on the floor. 3. Tighten your butt muscles and lift your butt off of the floor until your waist is almost as high as your knees. If you do not feel the muscles working in your butt and the back of your thighs, slide your feet 1-2 inches farther away from your butt. 4. Stay in this position for 3-5 seconds. 5. Slowly lower your butt to the floor, and let your butt muscles relax. If this exercise is too easy, try doing it with your arms crossed over your chest. Belly Crunches Do these steps 5-10 times in a row: 1. Lie on your back on a firm bed or the floor with your legs stretched out. 2. Bend your  knees so they point up to the ceiling. Your feet should be flat on the floor. 3. Cross your arms over your chest. 4. Tip your chin a little bit toward your chest but do not bend your neck. 5. Tighten your belly muscles and slowly raise your chest just enough to lift your shoulder blades a tiny bit off of the floor. 6. Slowly lower your chest and your head to the floor. Back Lifts Do these steps 5-10 times in a row: 1. Lie on your belly (face-down) with your arms at your sides, and rest your forehead on the floor. 2. Tighten the muscles in your legs and your butt. 3. Slowly lift your chest off of the floor while you keep your hips on the floor. Keep the back of your head in line with the curve in your back. Look at the floor while you do this. 4. Stay in this position for 3-5 seconds. 5. Slowly lower your chest and your face to the floor. Contact a doctor if:  Your back pain gets a lot worse when you do an exercise.  Your back pain does not lessen 2 hours after you exercise. If you have any of these problems, stop doing the exercises. Do not do them again unless your doctor says it is okay. Get help right away if:  You have sudden, very bad back pain. If this happens, stop doing the exercises. Do not do them again unless your doctor says it is okay. This information is not intended to replace advice given to you by your health care provider. Make sure you discuss any questions you have with your health care provider. Document Released: 01/04/2011 Document Revised: 08/26/2018 Document Reviewed: 01/26/2015 Elsevier Interactive Patient Education  [employer].

## 2019-06-10 LAB — URINE CULTURE

## 2019-06-15 NOTE — Progress Notes (Signed)
Chief Complaint  Patient presents with  . Follow-up    patient reports that within 2 days all symptoms were better.     Patient presents for follow-up on back and abdominal pain, and urinary frequency. She was seen by me 6/24 with these symptoms, which had persisted from prior evaluation with Audelia Acton on 6/12. She was given prescriptions for meloxicam and robaxin, to treat muscular back pain. Urine culture was negative for infection (<10,000 Mixed urogenital flora). She was advised to return for a follow-up in 1 week, and of need for pelvic exam if ongoing urinary or abdominal complaints.  Xray done 06/09/2019 showed: IMPRESSION: Mild degenerative changes.  No acute osseous abnormality.   She has been taking the meloxicam since last visit.  Denies side effects. She has been taking methocarbamol 500mg  once daily since last visit.  She reports that her pain completely resolved within a couple of days of taking these medications.  Her urinary complaints (frequency and lower abdominal pressure) resolved when her back pain resolved.  PMH, PSH, SH reviewed  Outpatient Encounter Medications as of 06/16/2019  Medication Sig  . Ascorbic Acid (VITAMIN C) 100 MG tablet Take 1 tablet (100 mg total) by mouth daily.  Marland Kitchen aspirin EC 81 MG tablet Take 81 mg by mouth daily.  Marland Kitchen atorvastatin (LIPITOR) 20 MG tablet Take 1 tablet (20 mg total) by mouth daily.  Marland Kitchen BREO ELLIPTA 100-25 MCG/INH AEPB TAKE 1 PUFF BY MOUTH EVERY DAY  . Calcium-Magnesium-Vitamin D (CALCIUM 500 PO) Take 1 tablet by mouth daily.  Marland Kitchen letrozole (FEMARA) 2.5 MG tablet TAKE 1 TABLET BY MOUTH EVERY DAY  . meloxicam (MOBIC) 15 MG tablet Take 1 tablet (15 mg total) by mouth daily.  . methocarbamol (ROBAXIN) 500 MG tablet Take 1-2 tablets (500-1,000 mg total) by mouth every 8 (eight) hours as needed for muscle spasms.  . Multiple Vitamins-Minerals (MULTIVITAMIN WITH MINERALS) tablet Take 1 tablet by mouth daily.  Marland Kitchen omeprazole (PRILOSEC OTC) 20  MG tablet Take 20 mg by mouth daily.  . polyethylene glycol (MIRALAX / GLYCOLAX) 17 g packet Take 17 g by mouth daily.  Marland Kitchen SPIRIVA RESPIMAT 2.5 MCG/ACT AERS TAKE 2 PUFFS BY MOUTH TWICE A DAY  . ipratropium-albuterol (DUONEB) 0.5-2.5 (3) MG/3ML SOLN Take 3 mLs by nebulization every 4 (four) hours as needed. (Patient not taking: Reported on 06/09/2019)  . sodium chloride HYPERTONIC 3 % nebulizer solution Take by nebulization as needed for other. (Patient not taking: Reported on 05/28/2019)  . [DISCONTINUED] meloxicam (MOBIC) 15 MG tablet    No facility-administered encounter medications on file as of 06/16/2019.    No Known Allergies  ROS: no fever, chills, URI symptoms, breathing at baseline. No chest pain, GI or GU complaints.  Urinary symptoms and pelvic pressure resolved.  Back pain resolved.  No numbness, tingling or other complaints. See HPI.   PHYSICAL EXAM:  BP 130/70   Pulse 80   Temp 98.4 F (36.9 C) (Temporal)   Ht 5\' 9"  (1.753 m)   Wt 146 lb 6.4 oz (66.4 kg)   BMI 21.62 kg/m   Wt Readings from Last 3 Encounters:  06/09/19 147 lb 6.4 oz (66.9 kg)  05/28/19 147 lb 6.4 oz (66.9 kg)  02/24/19 147 lb 9.6 oz (67 kg)    Well appearing, pleasant female, in no distress HEENT: conjunctiva and sclera are clear, EOMI. Wearing mask Back: no spinal tenderness, SI tenderness or muscular tenderness. No CVA tenderness Heart: regular rate and rhythm Lungs clear bilaterally with  fair air movement Abdomen: soft, nondistended, no organomegaly or mass. Nontender. Extremities: no edema Neuro: alert and oriented, normal gait Skin: normal turgor, no rash  ASSESSMENT/PLAN:  Low back pain, unspecified back pain laterality, unspecified chronicity, unspecified whether sciatica present - resolved with use of meloxicam and methocarbamol. Advised to stop (and use prn if recurs). To do daily strengthening exercise, proper bending/lifting. Given back exercises at prior visit, states she still has,  reminded to do them regularly.  Urinary frequency - and pelvic pressure completely resolved.  No longer has tenderness or urinary complaints, so pelvic not performed   F/u as scheduled in September

## 2019-06-16 ENCOUNTER — Other Ambulatory Visit: Payer: Self-pay

## 2019-06-16 ENCOUNTER — Encounter: Payer: Self-pay | Admitting: Family Medicine

## 2019-06-16 ENCOUNTER — Ambulatory Visit (INDEPENDENT_AMBULATORY_CARE_PROVIDER_SITE_OTHER): Payer: Medicare Other | Admitting: Family Medicine

## 2019-06-16 VITALS — BP 130/70 | HR 80 | Temp 98.4°F | Ht 69.0 in | Wt 146.4 lb

## 2019-06-16 DIAGNOSIS — M545 Low back pain, unspecified: Secondary | ICD-10-CM

## 2019-06-16 DIAGNOSIS — R350 Frequency of micturition: Secondary | ICD-10-CM

## 2019-06-16 NOTE — Patient Instructions (Signed)
I'm glad that your pain has completely resolved. You may STOP both the meloxicam and the muscle relaxant (methocarbamol). You may restart these if/when any pain recurs.

## 2019-07-15 ENCOUNTER — Encounter: Payer: Self-pay | Admitting: Family Medicine

## 2019-07-15 ENCOUNTER — Ambulatory Visit (INDEPENDENT_AMBULATORY_CARE_PROVIDER_SITE_OTHER): Payer: Medicare Other | Admitting: Family Medicine

## 2019-07-15 ENCOUNTER — Other Ambulatory Visit: Payer: Self-pay

## 2019-07-15 VITALS — BP 124/58 | HR 84 | Temp 98.6°F | Ht 69.0 in | Wt 145.8 lb

## 2019-07-15 DIAGNOSIS — Z853 Personal history of malignant neoplasm of breast: Secondary | ICD-10-CM

## 2019-07-15 DIAGNOSIS — N61 Mastitis without abscess: Secondary | ICD-10-CM

## 2019-07-15 MED ORDER — DOXYCYCLINE HYCLATE 100 MG PO TABS: 100.0000 mg | ORAL_TABLET | Freq: Two times a day (BID) | ORAL | 0 refills | Status: DC

## 2019-07-15 NOTE — Patient Instructions (Signed)
Start the antibiotics today, take it twice daily. Please let us know if you find the redness extending beyond the ink marks.  Be sure to avoid the sun and use sunscreen (to avoid rash/burn due to the antibiotic).  Return in 7-10 days, seek care sooner elsewhere if worse.

## 2019-07-15 NOTE — Progress Notes (Signed)
Chief Complaint  Patient presents with  . Breast Problem    left, red and warm to the touch     Went to the beach Tuesday (2 days ago), that night noticed redness, inflammation of the left breast.  Last night it got worse. Doesn't complain of pain, just redness and warmth. It was much brighter last night than it is currently. Having some left shoulder/arm pain. Feels similar to prior cellulitis. Denies any itching  H/o left breast cancer 03/2018 She had cellulitis of the left breast treated 09/2018 with doxycycline.  Hasn't had any problems since. Last mammogram was 02/2019. Has f/u appt with surgeon 07/30/2019.  PMH, PSH, SH reviewed  Outpatient Encounter Medications as of 07/15/2019  Medication Sig  . Ascorbic Acid (VITAMIN C) 100 MG tablet Take 1 tablet (100 mg total) by mouth daily.  Marland Kitchen aspirin EC 81 MG tablet Take 81 mg by mouth daily.  Marland Kitchen atorvastatin (LIPITOR) 20 MG tablet Take 1 tablet (20 mg total) by mouth daily.  Marland Kitchen BREO ELLIPTA 100-25 MCG/INH AEPB TAKE 1 PUFF BY MOUTH EVERY DAY  . Calcium-Magnesium-Vitamin D (CALCIUM 500 PO) Take 1 tablet by mouth daily.  Marland Kitchen letrozole (FEMARA) 2.5 MG tablet TAKE 1 TABLET BY MOUTH EVERY DAY  . meloxicam (MOBIC) 15 MG tablet Take 1 tablet (15 mg total) by mouth daily.  . methocarbamol (ROBAXIN) 500 MG tablet Take 1-2 tablets (500-1,000 mg total) by mouth every 8 (eight) hours as needed for muscle spasms.  . Multiple Vitamins-Minerals (MULTIVITAMIN WITH MINERALS) tablet Take 1 tablet by mouth daily.  Marland Kitchen omeprazole (PRILOSEC OTC) 20 MG tablet Take 20 mg by mouth daily.  . polyethylene glycol (MIRALAX / GLYCOLAX) 17 g packet Take 17 g by mouth daily.  Marland Kitchen SPIRIVA RESPIMAT 2.5 MCG/ACT AERS TAKE 2 PUFFS BY MOUTH TWICE A DAY  . doxycycline (VIBRA-TABS) 100 MG tablet Take 1 tablet (100 mg total) by mouth 2 (two) times daily.  Marland Kitchen ipratropium-albuterol (DUONEB) 0.5-2.5 (3) MG/3ML SOLN Take 3 mLs by nebulization every 4 (four) hours as needed. (Patient not  taking: Reported on 06/09/2019)  . sodium chloride HYPERTONIC 3 % nebulizer solution Take by nebulization as needed for other. (Patient not taking: Reported on 05/28/2019)   No facility-administered encounter medications on file as of 07/15/2019.    (NOT taking doxy prior to today's visit)  No Known Allergies  ROS:  No fever or chills, nausea or vomiting. No other rashes or skin concerns. Some muscular pain at the left chest and upper arm.  No known swollen glands. No bleeding, bruising.  No breast lumps or concerns prior to onset of the redness 2 days ago.  PHYSICAL EXAM:  BP (!) 124/58   Pulse 84   Temp 98.6 F (37 C) (Oral)   Ht 5\' 9"  (1.753 m)   Wt 145 lb 12.8 oz (66.1 kg)   SpO2 95%   BMI 21.53 kg/m   Pleasant, thi, well-appearing female in no distress. Occasional cough. Speaking easily in full sentences  Left breast--well healed surgical scar at the areola, left axilla, unchanged per pt. There is a diffuse erythema encompassing much of the left breast , extending to the left lateral chest wall (measuring >22cm in diameter from medial portion of erythema, to where it extends to lateral chest wall. There is slight warmth. Slight raised area at lateral chest wall (near scar)--unchanged per pt, and nontender.  There are many SK's througout her skin, one of which is slightly irritated inferiorly, below the left breast.  No crusting or swelling around this area.   Tender over the left pectoralis muscle No axillary lymphadenopathy   ASSESSMENT/PLAN:  Cellulitis of left breast - Plan: doxycycline (VIBRA-TABS) 100 MG tablet  Personal history of breast cancer     Cellulitis left breast Treat with doxycycline and recheck in 7-10 days. [employer] Sat through Tuesday night--to seek care in [place] if any worse during that time.

## 2019-07-28 DIAGNOSIS — H16211 Exposure keratoconjunctivitis, right eye: Secondary | ICD-10-CM | POA: Diagnosis not present

## 2019-07-30 DIAGNOSIS — Z170 Estrogen receptor positive status [ER+]: Secondary | ICD-10-CM | POA: Diagnosis not present

## 2019-07-30 DIAGNOSIS — I890 Lymphedema, not elsewhere classified: Secondary | ICD-10-CM | POA: Diagnosis not present

## 2019-07-30 DIAGNOSIS — C50312 Malignant neoplasm of lower-inner quadrant of left female breast: Secondary | ICD-10-CM | POA: Diagnosis not present

## 2019-07-31 ENCOUNTER — Encounter: Payer: Self-pay | Admitting: Family Medicine

## 2019-08-03 ENCOUNTER — Ambulatory Visit: Payer: Medicare Other | Attending: General Surgery | Admitting: Physical Therapy

## 2019-08-03 ENCOUNTER — Other Ambulatory Visit: Payer: Self-pay

## 2019-08-03 DIAGNOSIS — I890 Lymphedema, not elsewhere classified: Secondary | ICD-10-CM | POA: Insufficient documentation

## 2019-08-03 DIAGNOSIS — M25612 Stiffness of left shoulder, not elsewhere classified: Secondary | ICD-10-CM

## 2019-08-03 DIAGNOSIS — R293 Abnormal posture: Secondary | ICD-10-CM | POA: Diagnosis not present

## 2019-08-03 DIAGNOSIS — L599 Disorder of the skin and subcutaneous tissue related to radiation, unspecified: Secondary | ICD-10-CM | POA: Diagnosis not present

## 2019-08-03 DIAGNOSIS — Z483 Aftercare following surgery for neoplasm: Secondary | ICD-10-CM | POA: Insufficient documentation

## 2019-08-03 NOTE — Therapy (Signed)
[place], [place], [address] Phone: [phone]   Fax:  [phone]  Physical Therapy Evaluation  Patient Details  Name: Molly Ross MRN: [id number] Date of Birth: 06-17-1941 Referring Provider (PT): Dr. Stark Klein   Encounter Date: 08/03/2019  PT End of Session - 08/03/19 1520    Visit Number  1    Number of Visits  9    Date for PT Re-Evaluation  09/03/19    PT Start Time  1430    PT Stop Time  1515    PT Time Calculation (min)  45 min    Activity Tolerance  Patient tolerated treatment well    Behavior During Therapy  [facility] for tasks assessed/performed       Past Medical History:  Diagnosis Date  . Atherosclerosis of aorta ([place]) 02/02/2015   Noted on imaging   . Bronchiectasis ([place])   . Bronchiectasis with (acute) exacerbation ([place]) 06/09/2015  . CAD (coronary artery disease), native coronary artery    Coronary calcification noted on CT scan 2016  Negative treadmill myoview 07/20/15 with EF 76%   . Cancer ([place])   . Cellulitis of left breast 10/01/2018  . Colon polyp   . COPD, moderate ([place]) 11/06/2015  . Depressive disorder, not elsewhere classified 12/04/2011  . Dyspnea   . Emphysema of lung ([place]) 02/02/2015  . Family history of lung cancer   . Family history of ovarian cancer   . Family history of prostate cancer   . Genetic testing 03/27/2018   The Common Hereditary Cancer Panel offered by [facility] includes sequencing and/or deletion duplication testing of the following 47 genes: APC, ATM, AXIN2, BARD1, BMPR1A, BRCA1, BRCA2, BRIP1, CDH1, CDKN2A (p14ARF), CDKN2A (p16INK4a), CKD4, CHEK2, CTNNA1, DICER1, EPCAM (Deletion/duplication testing only), GREM1 (promoter region deletion/duplication testing only), KIT, MEN1, MLH1, MSH2, MSH3, MSH6, MU  . GERD (gastroesophageal reflux disease)   . Hiatal hernia 02/02/2015   small, noted on CT of chest   . History of radiation therapy 04/16/18- 05/14/18   42.56 Gy directed to the Left Breast in 16 fractions followed by a boost of 8 Gy in 4 fractions  . History of smoking 25-50 pack years 06/09/2015  . Malignant neoplasm of lower-inner quadrant of left breast in female, estrogen receptor positive ([place]) 03/06/2018  . Osteopenia 02/18/2018   DEXA 02/2018, T-1.8 R femoral neck  . Pleuritic pain 10/01/2018  . Shingles 1/09, 07/2010, 11/2017  . Unspecified vitamin D deficiency 02/2011 and 2016  . Vitamin D deficiency 02/14/2011    Past Surgical History:  Procedure Laterality Date  . BREAST LUMPECTOMY WITH RADIOACTIVE SEED AND SENTINEL LYMPH NODE BIOPSY Left 03/19/2018   Procedure: BREAST LUMPECTOMY WITH RADIOACTIVE SEED AND SENTINEL LYMPH NODE BIOPSY;  Surgeon: Stark Klein, MD;  Location: [place];  Service: General;  Laterality: Left;  . CATARACT EXTRACTION, BILATERAL Bilateral 03/2017, 04/2017   [facility], Dr. Alanda Slim  . SHOULDER SURGERY  03/2011   for frozen shoulder, and cyst removal (Dr. Percell Miller)  . TONSILLECTOMY  child  . VIDEO BRONCHOSCOPY Bilateral 06/21/2016   Procedure: VIDEO BRONCHOSCOPY WITHOUT FLUORO;  Surgeon: Brand Males, MD;  [facility];  Service: Cardiopulmonary;  Laterality: Bilateral;    There were no vitals filed for this visit.   Subjective Assessment - 08/03/19 1439    Subjective  Pt says she is still having discoloration of her breast and it looks inflamed    Pertinent History  Patient was diagnosed on 02/18/18  with left grade 2 invasive ductal carcinoma breast cancer. It measured 1.2 cm and is located in the lower inner quadrant. It was ER/PR positive and HER2 negative with a Ki67 of 5%. She had a previous left shoulder scope in 2012. Patient underwent a left lumpectomy and sentinel node biopsy (1/3 positive) on 03/19/18.  She had cellulitis in left breast in October that got better with antibiotics, Recurrance of breast cellulitis with just completion of antibiotics    Currently in Pain?  No/denies          [facility] PT Assessment - 08/03/19 0001      Assessment   Medical Diagnosis  s/p left lumpectomy and SLNB    Referring Provider (PT)  Dr. Stark Klein    Onset Date/Surgical Date  03/19/18    Hand Dominance  Right    Prior Therapy  in 2019       Precautions   Precautions  Other (comment)    Precaution Comments  breast cancer lumpectomy with radiation       Restrictions   Weight Bearing Restrictions  No      Balance Screen   Has the patient fallen in the past 6 months  No    Has the patient had a decrease in activity level because of a fear of falling?   No    Is the patient reluctant to leave their home because of a fear of falling?   No      Prior Function   Level of Independence  Independent    Vocation  Unemployed   museum closed due to [employer]  pt lives with sister who is in a wheelchair and pt has to push the wheelchair     Leisure  feels like daily activities are exercise enough       Cognition   Overall Cognitive Status  Within Functional Limits for tasks assessed      Observation/Other Assessments   Observations  pt has mild redness in left breast with induration at incision in inferior aerola, and raised red area at medial portion of left breast that she says sometimes gets itchy and gets worse when she gets hot. Pt with multiple dark nevi over trunk     Skin Integrity  no open areas, well healed incisions       Sensation   Light Touch  Not tested      Coordination   Gross Motor Movements are Fluid and Coordinated  Yes      Posture/Postural Control   Posture/Postural Control  Postural limitations    Postural Limitations  Rounded Shoulders;Forward head;Increased thoracic kyphosis      ROM / Strength   AROM / PROM / Strength  AROM      AROM   Overall AROM   Deficits    Overall AROM Comments  limited shoulder elevation bilaterally     AROM Assessment Site  Shoulder    Right/Left Shoulder  Right;Left    Right Shoulder Flexion  130  Degrees    Right Shoulder ABduction  155 Degrees    Left Shoulder Flexion  135 Degrees    Left Shoulder ABduction  135 Degrees      Strength   Overall Strength  Deficits    Overall Strength Comments  pt thin and appears to have gereralized muscle atrophy      Palpation   Palpation comment  very tender tightness and firmness of pec major muscle and at posterior shoulder  at [place] - 08/03/19 1517      Type   Cancer Type  Left breast cancer      Surgeries   Lumpectomy Date  03/19/18    Sentinel Lymph Node Biopsy Date  03/19/18    Number Lymph Nodes Removed  3      Treatment   Active Chemotherapy Treatment  No    Past Chemotherapy Treatment  No    Active Radiation Treatment  No    Past Radiation Treatment  Yes    Date  05/16/18   approx   Body Site  left breast and axilla     Current Hormone Treatment  Yes    Drug Name  letrozole       What other symptoms do you have   Are you Having Heaviness or Tightness  Yes   left breast, axilla and lateral chest    Are you having Pain  No    Are you having pitting edema  No    Is it Hard or Difficult finding clothes that fit  No    Do you have infections  Yes    Comments  cellulitis in breast x     Is there Decreased scar mobility  Yes   at [employer] Sign  No      Right Upper Extremity Lymphedema   10 cm Proximal to Olecranon Process  25.5 cm    Olecranon Process  22.5 cm    10 cm Proximal to Ulnar Styloid Process  19 cm    Just Proximal to Ulnar Styloid Process  14.5 cm    Across Hand at [employer]  19 cm    At [place] of 2nd Digit  6.2 cm      Left Upper Extremity Lymphedema   10 cm Proximal to Olecranon Process  25.6 cm    Olecranon Process  24 cm    10 cm Proximal to Ulnar Styloid Process  18 cm    Just Proximal to Ulnar Styloid Process  15.2 cm    Across Hand at [employer]  19 cm    At [place] of 2nd Digit  6 cm               Objective measurements  completed on examination: See above findings.      [place] Adult PT Treatment/Exercise - 08/03/19 0001      Exercises   Exercises  Shoulder      Shoulder Exercises: Supine   Other Supine Exercises  supine dowel rod flexion and abduction, handout given                   PT Long Term Goals - 08/03/19 1813      PT LONG TERM GOAL #1   Title  Pt will report that fullness and tightness in her breast is reduced by 50%    Time  4    Period  Weeks    Status  New      PT LONG TERM GOAL #2   Title  Pt will be indepenedent in self manual lymph drainage and use of compression to manage her breast lymphedema    Time  4    Period  Weeks    Status  New      PT LONG TERM GOAL #3   Title  Pt will report the tenderness and tightness in her left chest  and posterior axilla is decreased by 50%    Time  4    Period  Weeks    Status  New      PT LONG TERM GOAL #4   Title  Pt is independent in a HEP for shoulder ROM    Time  4    Period  Weeks    Status  New             Plan - 08/03/19 1522    Clinical Impression Statement  Pt comes to PT after second bout of left breat cellulits for evaluation of breast lymphedema.  She has tightness in left anterior chest and posterior axilla with fullness in lateral breast and lateral chest as well as around aerola and medial breast.  She would benefit from manual lymph drainange and instruction in self care as well as soft tissue work to muscle tightness. She says she may not be able to tolerate compression bra as she had it post op and she could not tolerate it then, but she wants to try treatment for her current symptoms    Personal Factors and Comorbidities  Age;Comorbidity 3+    Comorbidities  previous cancer surgery and radiation, cellulitis x 2, lung disease    Examination-Activity Limitations  Caring for Others;Reach Overhead;Sleep    Stability/Clinical Decision Making  Stable/Uncomplicated    Clinical Decision Making  Low    Rehab  Potential  Good    Clinical Impairments Affecting Rehab Potential  previous radiation    PT Frequency  2x / week    PT Duration  4 weeks    PT Treatment/Interventions  ADLs/Self Care Home Management;Therapeutic exercise;Patient/family education;DME Instruction;Neuromuscular re-education;Orthotic Fit/Training;Manual lymph drainage;Manual techniques;Passive range of motion;Scar mobilization;Compression bandaging;Taping    PT Next Visit Plan  begin and instruct in manual lymph drainage, soft tissue work to tight pec and posterior shoulder/axilla, ROM and strengthening exercise    PT Home Exercise Plan  supine dowel rod flexion and abduction    Consulted and Agree with Plan of Care  Patient       Patient will benefit from skilled therapeutic intervention in order to improve the following deficits and impairments:  Decreased knowledge of precautions, Impaired UE functional use, Decreased range of motion, Postural dysfunction, Pain, Decreased skin integrity, Increased muscle spasms, Increased edema, Decreased scar mobility, Decreased activity tolerance, Decreased knowledge of use of DME, Decreased strength  Visit Diagnosis: 1. Lymphedema, not elsewhere classified   2. Stiffness of left shoulder, not elsewhere classified   3. Disorder of the skin and subcutaneous tissue related to radiation, unspecified   4. Abnormal posture        Problem List Patient Active Problem List   Diagnosis Date Noted  . Hyperlipidemia 02/09/2019  . Cellulitis of left breast 10/01/2018  . Pleuritic pain 10/01/2018  . Genetic testing 03/27/2018  . Family history of ovarian cancer   . Family history of lung cancer   . Family history of prostate cancer   . Malignant neoplasm of lower-inner quadrant of left breast in female, estrogen receptor positive ([place]) 03/06/2018  . Osteopenia 02/18/2018  . COPD, moderate ([place]) 11/06/2015  . Coronary artery calcification seen on CT scan   . Bronchiectasis with (acute)  exacerbation ([place]) 06/09/2015  . History of smoking 25-50 pack years 06/09/2015  . Atherosclerosis of aorta ([place]) 02/02/2015  . Hiatal hernia 02/02/2015  . Emphysema of lung ([place]) 02/02/2015  . Depressive disorder, not elsewhere classified 12/04/2011  . Vitamin D deficiency 02/14/2011  Donato Heinz. Owens Shark PT  Norwood Levo 08/03/2019, 6:20 PM  [place], [place], [address] Phone: [phone]   Fax:  [phone]  Name: Molly Ross MRN: [id number] Date of Birth: 1941/08/08

## 2019-08-03 NOTE — Patient Instructions (Signed)
SHOULDER: Flexion - Supine (Cane)        Cancer Rehab [phone]    Hold cane in both hands. Raise arms up overhead. Do not allow back to arch. Hold _5__ seconds. Do __5-10__ times; __1-2__ times a day.   SELF ASSISTED WITH OBJECT: Shoulder Abduction / Adduction - Supine    Hold cane with both hands. Move both arms from side to side, keep elbows straight.  Hold when stretch felt for __5__ seconds. Repeat __5-10__ times; __1-2__ times a day. Once this becomes easier progress to third picture bringing affected arm towards ear by staying out to side. Same hold for _5_seconds. Repeat  _5-10_ times, _1-2_ times/day.   

## 2019-08-10 ENCOUNTER — Other Ambulatory Visit: Payer: Self-pay

## 2019-08-10 ENCOUNTER — Ambulatory Visit: Payer: Medicare Other | Admitting: Physical Therapy

## 2019-08-10 DIAGNOSIS — L599 Disorder of the skin and subcutaneous tissue related to radiation, unspecified: Secondary | ICD-10-CM

## 2019-08-10 DIAGNOSIS — Z483 Aftercare following surgery for neoplasm: Secondary | ICD-10-CM

## 2019-08-10 DIAGNOSIS — M25612 Stiffness of left shoulder, not elsewhere classified: Secondary | ICD-10-CM

## 2019-08-10 DIAGNOSIS — R293 Abnormal posture: Secondary | ICD-10-CM

## 2019-08-10 DIAGNOSIS — I890 Lymphedema, not elsewhere classified: Secondary | ICD-10-CM

## 2019-08-10 NOTE — Therapy (Signed)
[place], [place], [address] Phone: [phone]   Fax:  [phone]  Physical Therapy Treatment  Patient Details  Name: Molly Ross MRN: [id number] Date of Birth: 07-Apr-1941 Referring Provider (PT): Dr. Stark Klein   Encounter Date: 08/10/2019  PT End of Session - 08/10/19 1429    Visit Number  2    Number of Visits  9    Date for PT Re-Evaluation  09/03/19    PT Start Time  1330    PT Stop Time  1315    PT Time Calculation (min)  1425 min    Activity Tolerance  Patient tolerated treatment well    Behavior During Therapy  [facility] for tasks assessed/performed       Past Medical History:  Diagnosis Date  . Atherosclerosis of aorta ([place]) 02/02/2015   Noted on imaging   . Bronchiectasis ([place])   . Bronchiectasis with (acute) exacerbation ([place]) 06/09/2015  . CAD (coronary artery disease), native coronary artery    Coronary calcification noted on CT scan 2016  Negative treadmill myoview 07/20/15 with EF 76%   . Cancer ([place])   . Cellulitis of left breast 10/01/2018  . Colon polyp   . COPD, moderate ([place]) 11/06/2015  . Depressive disorder, not elsewhere classified 12/04/2011  . Dyspnea   . Emphysema of lung ([place]) 02/02/2015  . Family history of lung cancer   . Family history of ovarian cancer   . Family history of prostate cancer   . Genetic testing 03/27/2018   The Common Hereditary Cancer Panel offered by [facility] includes sequencing and/or deletion duplication testing of the following 47 genes: APC, ATM, AXIN2, BARD1, BMPR1A, BRCA1, BRCA2, BRIP1, CDH1, CDKN2A (p14ARF), CDKN2A (p16INK4a), CKD4, CHEK2, CTNNA1, DICER1, EPCAM (Deletion/duplication testing only), GREM1 (promoter region deletion/duplication testing only), KIT, MEN1, MLH1, MSH2, MSH3, MSH6, MU  . GERD (gastroesophageal reflux disease)   . Hiatal hernia 02/02/2015   small, noted on CT of chest   . History of radiation therapy 04/16/18- 05/14/18    42.56 Gy directed to the Left Breast in 16 fractions followed by a boost of 8 Gy in 4 fractions  . History of smoking 25-50 pack years 06/09/2015  . Malignant neoplasm of lower-inner quadrant of left breast in female, estrogen receptor positive ([place]) 03/06/2018  . Osteopenia 02/18/2018   DEXA 02/2018, T-1.8 R femoral neck  . Pleuritic pain 10/01/2018  . Shingles 1/09, 07/2010, 11/2017  . Unspecified vitamin D deficiency 02/2011 and 2016  . Vitamin D deficiency 02/14/2011    Past Surgical History:  Procedure Laterality Date  . BREAST LUMPECTOMY WITH RADIOACTIVE SEED AND SENTINEL LYMPH NODE BIOPSY Left 03/19/2018   Procedure: BREAST LUMPECTOMY WITH RADIOACTIVE SEED AND SENTINEL LYMPH NODE BIOPSY;  Surgeon: Stark Klein, MD;  Location: [place];  Service: General;  Laterality: Left;  . CATARACT EXTRACTION, BILATERAL Bilateral 03/2017, 04/2017   [facility], Dr. Alanda Slim  . SHOULDER SURGERY  03/2011   for frozen shoulder, and cyst removal (Dr. Percell Miller)  . TONSILLECTOMY  child  . VIDEO BRONCHOSCOPY Bilateral 06/21/2016   Procedure: VIDEO BRONCHOSCOPY WITHOUT FLUORO;  Surgeon: Brand Males, MD;  [facility];  Service: Cardiopulmonary;  Laterality: Bilateral;    There were no vitals filed for this visit.  Subjective Assessment - 08/10/19 1331    Subjective  Pt reports she is doing well today.  She is ready for treatment  she says she always has a little pain in her left  neck and shoulder    Pertinent History  Patient was diagnosed on 02/18/18 with left grade 2 invasive ductal carcinoma breast cancer. It measured 1.2 cm and is located in the lower inner quadrant. It was ER/PR positive and HER2 negative with a Ki67 of 5%. She had a previous left shoulder scope in 2012. Patient underwent a left lumpectomy and sentinel node biopsy (1/3 positive) on 03/19/18.  She had cellulitis in left breast in October that got better with antibiotics, Recurrance of breast cellulitis with just  completion of antibiotics    Currently in Pain?  Yes    Pain Score  3     Pain Location  Shoulder    Pain Orientation  Left    Pain Descriptors / Indicators  Aching    Pain Type  Chronic pain    Pain Radiating Towards  mainly in the shoulder and the back of the neck    Aggravating Factors   can't say                       [facility] Adult PT Treatment/Exercise - 08/10/19 0001      Self-Care   Self-Care  Other Self-Care Comments    Other Self-Care Comments   gave pt piece of small dotted foam to wear at anterior chest over pec major and at lateral chest over full areas       Exercises   Exercises  Neck      Neck Exercises: Seated   Other Seated Exercise  neck ROM x 1 rep in sitting pt with tightness in sidel bending to each direction and in rotation       Manual Therapy   Manual Therapy  Soft tissue mobilization;Manual Lymphatic Drainage (MLD)    Soft tissue mobilization  with thick massage cream and pt in right sidelying to left upper trap, posteior shoulder and posterior axillary region     Manual Lymphatic Drainage (MLD)  in supine, short neck, superficial and deep abdominals, right axillary nodes, anterior interaxillary anastamosis, left inguinal nodes, left axillo-inguinal anastamosis, left breast and lateral trunk then to right sidelying for posterior interaxiallry anastamosis, lateral chest and  back                   PT Long Term Goals - 08/03/19 1813      PT LONG TERM GOAL #1   Title  Pt will report that fullness and tightness in her breast is reduced by 50%    Time  4    Period  Weeks    Status  New      PT LONG TERM GOAL #2   Title  Pt will be indepenedent in self manual lymph drainage and use of compression to manage her breast lymphedema    Time  4    Period  Weeks    Status  New      PT LONG TERM GOAL #3   Title  Pt will report the tenderness and tightness in her left chest and posterior axilla is decreased by 50%    Time  4    Period   Weeks    Status  New      PT LONG TERM GOAL #4   Title  Pt is independent in a HEP for shoulder ROM    Time  4    Period  Weeks    Status  New            Plan -  08/10/19 1430    Clinical Impression Statement  Pt reports good relief with treatment today.  She had some firmness at lateral chest that felt softer at end of session.  Pt will try small dotted foam in bra also    Comorbidities  previous cancer surgery and radiation, cellulitis x 2, lung disease    Stability/Clinical Decision Making  Stable/Uncomplicated    Clinical Impairments Affecting Rehab Potential  previous radiation    PT Frequency  2x / week    PT Duration  4 weeks    PT Treatment/Interventions  ADLs/Self Care Home Management;Therapeutic exercise;Patient/family education;DME Instruction;Neuromuscular re-education;Orthotic Fit/Training;Manual lymph drainage;Manual techniques;Passive range of motion;Scar mobilization;Compression bandaging;Taping    PT Next Visit Plan  instruct in sefl manual lymph drainage, soft tissue work to tight pec and posterior shoulder/axilla, ROM and strengthening exercise work on postural exercises    Consulted and Agree with Plan of Care  Patient       Patient will benefit from skilled therapeutic intervention in order to improve the following deficits and impairments:  Decreased knowledge of precautions, Impaired UE functional use, Decreased range of motion, Postural dysfunction, Pain, Decreased skin integrity, Increased muscle spasms, Increased edema, Decreased scar mobility, Decreased activity tolerance, Decreased knowledge of use of DME, Decreased strength  Visit Diagnosis: Lymphedema, not elsewhere classified  Stiffness of left shoulder, not elsewhere classified  Disorder of the skin and subcutaneous tissue related to radiation, unspecified  Abnormal posture  Aftercare following surgery for neoplasm     Problem List Patient Active Problem List   Diagnosis Date Noted  .  Hyperlipidemia 02/09/2019  . Cellulitis of left breast 10/01/2018  . Pleuritic pain 10/01/2018  . Genetic testing 03/27/2018  . Family history of ovarian cancer   . Family history of lung cancer   . Family history of prostate cancer   . Malignant neoplasm of lower-inner quadrant of left breast in female, estrogen receptor positive ([place]) 03/06/2018  . Osteopenia 02/18/2018  . COPD, moderate ([place]) 11/06/2015  . Coronary artery calcification seen on CT scan   . Bronchiectasis with (acute) exacerbation ([place]) 06/09/2015  . History of smoking 25-50 pack years 06/09/2015  . Atherosclerosis of aorta ([place]) 02/02/2015  . Hiatal hernia 02/02/2015  . Emphysema of lung ([place]) 02/02/2015  . Depressive disorder, not elsewhere classified 12/04/2011  . Vitamin D deficiency 02/14/2011   Donato Heinz. Owens Shark PT  Norwood Levo 08/10/2019, 2:33 PM  [place], [place], [address] Phone: [phone]   Fax:  [phone]  Name: Molly Ross MRN: [id number] Date of Birth: 03-21-1941

## 2019-08-17 ENCOUNTER — Encounter: Payer: Self-pay | Admitting: Physical Therapy

## 2019-08-17 ENCOUNTER — Other Ambulatory Visit: Payer: Self-pay

## 2019-08-17 ENCOUNTER — Ambulatory Visit: Payer: Medicare Other | Attending: General Surgery | Admitting: Physical Therapy

## 2019-08-17 DIAGNOSIS — L599 Disorder of the skin and subcutaneous tissue related to radiation, unspecified: Secondary | ICD-10-CM

## 2019-08-17 DIAGNOSIS — R293 Abnormal posture: Secondary | ICD-10-CM | POA: Diagnosis not present

## 2019-08-17 DIAGNOSIS — M25612 Stiffness of left shoulder, not elsewhere classified: Secondary | ICD-10-CM | POA: Diagnosis not present

## 2019-08-17 DIAGNOSIS — Z483 Aftercare following surgery for neoplasm: Secondary | ICD-10-CM | POA: Insufficient documentation

## 2019-08-17 DIAGNOSIS — I890 Lymphedema, not elsewhere classified: Secondary | ICD-10-CM | POA: Diagnosis not present

## 2019-08-17 NOTE — Therapy (Addendum)
[place], [place], [address] Phone: [phone]   Fax:  [phone]  Physical Therapy Treatment  Patient Details  Name: Molly Ross MRN: [id number] Date of Birth: Feb 27, 1941 Referring Provider (PT): Dr. Stark Klein   Encounter Date: 08/17/2019  PT End of Session - 08/17/19 1148    Visit Number  3    Number of Visits  9    Date for PT Re-Evaluation  09/03/19    PT Start Time  1100    PT Stop Time  1145    PT Time Calculation (min)  45 min    Activity Tolerance  Patient tolerated treatment well    Behavior During Therapy  [facility] for tasks assessed/performed       Past Medical History:  Diagnosis Date  . Atherosclerosis of aorta ([place]) 02/02/2015   Noted on imaging   . Bronchiectasis ([place])   . Bronchiectasis with (acute) exacerbation ([place]) 06/09/2015  . CAD (coronary artery disease), native coronary artery    Coronary calcification noted on CT scan 2016  Negative treadmill myoview 07/20/15 with EF 76%   . Cancer ([place])   . Cellulitis of left breast 10/01/2018  . Colon polyp   . COPD, moderate ([place]) 11/06/2015  . Depressive disorder, not elsewhere classified 12/04/2011  . Dyspnea   . Emphysema of lung ([place]) 02/02/2015  . Family history of lung cancer   . Family history of ovarian cancer   . Family history of prostate cancer   . Genetic testing 03/27/2018   The Common Hereditary Cancer Panel offered by [facility] includes sequencing and/or deletion duplication testing of the following 47 genes: APC, ATM, AXIN2, BARD1, BMPR1A, BRCA1, BRCA2, BRIP1, CDH1, CDKN2A (p14ARF), CDKN2A (p16INK4a), CKD4, CHEK2, CTNNA1, DICER1, EPCAM (Deletion/duplication testing only), GREM1 (promoter region deletion/duplication testing only), KIT, MEN1, MLH1, MSH2, MSH3, MSH6, MU  . GERD (gastroesophageal reflux disease)   . Hiatal hernia 02/02/2015   small, noted on CT of chest   . History of radiation therapy 04/16/18- 05/14/18    42.56 Gy directed to the Left Breast in 16 fractions followed by a boost of 8 Gy in 4 fractions  . History of smoking 25-50 pack years 06/09/2015  . Malignant neoplasm of lower-inner quadrant of left breast in female, estrogen receptor positive ([place]) 03/06/2018  . Osteopenia 02/18/2018   DEXA 02/2018, T-1.8 R femoral neck  . Pleuritic pain 10/01/2018  . Shingles 1/09, 07/2010, 11/2017  . Unspecified vitamin D deficiency 02/2011 and 2016  . Vitamin D deficiency 02/14/2011    Past Surgical History:  Procedure Laterality Date  . BREAST LUMPECTOMY WITH RADIOACTIVE SEED AND SENTINEL LYMPH NODE BIOPSY Left 03/19/2018   Procedure: BREAST LUMPECTOMY WITH RADIOACTIVE SEED AND SENTINEL LYMPH NODE BIOPSY;  Surgeon: Stark Klein, MD;  Location: [facility];  Service: General;  Laterality: Left;  . CATARACT EXTRACTION, BILATERAL Bilateral 03/2017, 04/2017   [facility], Dr. Alanda Slim  . SHOULDER SURGERY  03/2011   for frozen shoulder, and cyst removal (Dr. Percell Miller)  . TONSILLECTOMY  child  . VIDEO BRONCHOSCOPY Bilateral 06/21/2016   Procedure: VIDEO BRONCHOSCOPY WITHOUT FLUORO;  Surgeon: Brand Males, MD;  Location: [facility];  Service: Cardiopulmonary;  Laterality: Bilateral;    There were no vitals filed for this visit.  Subjective Assessment - 08/17/19 1107    Subjective  Pt says she thinks the redness is better .    Pertinent History  Patient was diagnosed on 02/18/18 with left grade 2 invasive  ductal carcinoma breast cancer. It measured 1.2 cm and is located in the lower inner quadrant. It was ER/PR positive and HER2 negative with a Ki67 of 5%. She had a previous left shoulder scope in 2012. Patient underwent a left lumpectomy and sentinel node biopsy (1/3 positive) on 03/19/18.  She had cellulitis in left breast in October that got better with antibiotics, Recurrance of breast cellulitis with just completion of antibiotics    Currently in Pain?  No/denies                        [facility] Adult PT Treatment/Exercise - 08/17/19 0001      Manual Therapy   Manual Therapy  Edema management;Myofascial release;Manual Lymphatic Drainage (MLD)    Manual therapy comments  reinforced AROM of shoulder into flexion in supine and sidelying     Edema Management  instructed in self manual lymph drainage to left breast with handout, verbal and hand over hand technique.     Myofascial Release  to pec major with compression to muscle between my fingers and then pressure to pec minor muscle pt felt relief with treatment     Manual Lymphatic Drainage (MLD)  in supine, short neck, superficial and deep abdominals, right axillary nodes, anterior interaxillary anastamosis, left inguinal nodes, left axillo-inguinal anastamosis, left breast and lateral trunk then to right sidelying for posterior interaxiallry anastamosis, lateral chest and  back              PT Education - 08/17/19 1148    Education Details  self MLD, AROM to shoulders into flexion in supine and sidelying    Person(s) Educated  Patient    Methods  Explanation;Demonstration;Handout    Comprehension  Verbalized understanding;Returned demonstration          PT Long Term Goals - 08/17/19 1151      PT LONG TERM GOAL #1   Title  Pt will report that fullness and tightness in her breast is reduced by 50%    Status  Achieved      PT LONG TERM GOAL #2   Title  Pt will be indepenedent in self manual lymph drainage and use of compression to manage her breast lymphedema    Period  Weeks    Status  On-going      PT LONG TERM GOAL #3   Title  Pt will report the tenderness and tightness in her left chest and posterior axilla is decreased by 50%    Status  Achieved      PT LONG TERM GOAL #4   Title  Pt is independent in a HEP for shoulder ROM    Status  On-going            Plan - 08/17/19 1149    Clinical Impression Statement  Pt is much improved.  Very little edema perceived in  left breast with only minor fullness at left lateral chest.  Pt was able to demonstrate good technique of self MLD and shoulder ROM ( though it is limited in end range ) decreased to one time a week    Comorbidities  previous cancer surgery and radiation, cellulitis x 2, lung disease    Examination-Activity Limitations  Caring for Others;Reach Overhead;Sleep    Clinical Impairments Affecting Rehab Potential  previous radiation    PT Frequency  2x / week    PT Duration  4 weeks    PT Next Visit Plan  Reassess goals, review  sefl manual lymph drainage, soft tissue work to tight pec and posterior shoulder/axilla, ROM and strengthening exercise work on postural exercises potential discharge this visit       Patient will benefit from skilled therapeutic intervention in order to improve the following deficits and impairments:  Decreased knowledge of precautions, Impaired UE functional use, Decreased range of motion, Postural dysfunction, Pain, Decreased skin integrity, Increased muscle spasms, Increased edema, Decreased scar mobility, Decreased activity tolerance, Decreased knowledge of use of DME, Decreased strength  Visit Diagnosis: Lymphedema, not elsewhere classified  Stiffness of left shoulder, not elsewhere classified  Disorder of the skin and subcutaneous tissue related to radiation, unspecified  Abnormal posture  Aftercare following surgery for neoplasm     Problem List Patient Active Problem List   Diagnosis Date Noted  . Hyperlipidemia 02/09/2019  . Cellulitis of left breast 10/01/2018  . Pleuritic pain 10/01/2018  . Genetic testing 03/27/2018  . Family history of ovarian cancer   . Family history of lung cancer   . Family history of prostate cancer   . Malignant neoplasm of lower-inner quadrant of left breast in female, estrogen receptor positive ([place]) 03/06/2018  . Osteopenia 02/18/2018  . COPD, moderate ([place]) 11/06/2015  . Coronary artery calcification seen on CT scan    . Bronchiectasis with (acute) exacerbation ([place]) 06/09/2015  . History of smoking 25-50 pack years 06/09/2015  . Atherosclerosis of aorta ([place]) 02/02/2015  . Hiatal hernia 02/02/2015  . Emphysema of lung ([place]) 02/02/2015  . Depressive disorder, not elsewhere classified 12/04/2011  . Vitamin D deficiency 02/14/2011   Donato Heinz. Owens Shark PT  Norwood Levo 08/17/2019, 11:52 AM  [place], [place], [address] Phone: [phone]   Fax:  [phone]  Name: NORA SABEY MRN: [id number] Date of Birth: [birth year]  PHYSICAL THERAPY DISCHARGE SUMMARY  Visits from Start of Care: 3 Current functional level related to goals / functional outcomes: unknown  Remaining deficits: unknown   Education / Equipment: Home management of lymphedema  Plan: Patient agrees to discharge.  Patient goals were partially met. Patient is being discharged due to not returning since the last visit.  ?????    Donato Heinz. Owens Shark, PT

## 2019-08-17 NOTE — Patient Instructions (Signed)
Manual Lymph Drainage for Left Breast.  Do daily.  Do slowly. Use flat hands with just enough pressure to stretch the skin. Do not slide over the skin, but move the skin with the hand you're using. Lie down or sit comfortably (in a recliner, for example) to do this.  1) Hug yourself:  cross arms and do circles at collar bones near neck 5-7 times (to "wake up" lots of lymph nodes in this area). 2) Take slow deep breaths, allowing your belly to balloon out as your breathe in, 5x (to "wake up" abdominal lymph nodes to take on extra fluid). 3) Right armpit-stretch skin in small circles to stimulate intact lymph nodes there, 5-7x. 4) Left groin area, at panty line-stretch skin in small circles to stimulate lymph nodes 5-7x. 5) Redirect fluid from left chest toward right armpit (stretch skin starting at left chest in 3-4 spots working toward right armpit) 3-4x across the chest. 6) Redirect fluid from left armpit toward left groin (cup your hand around the curve of your left side and do 3-4 "pumps" from armpit to groin) 3-4x down your side. 7) Draw an imaginary diagonal line from upper outer breast through the nipple area toward lower inner breast.  Direct fluid upward and inward from this line toward the pathway across your upper chest (established in #5).  Do this in three rows to treat all of the upper inner breast tissue, and do each row 3-4x. 8) Then repeat #5 above. 9) Direct fluid to treat all of lower outer breast tissue downward and outward toward pathway established in #6 that is aimed at the left groin. 10)  Then repeat #6 above. 11)  End with repeating #3 and #4 above.   [facility] [address] [place], [place]   [address] [phone] 

## 2019-08-19 ENCOUNTER — Encounter: Payer: Medicare Other | Admitting: Physical Therapy

## 2019-08-19 ENCOUNTER — Other Ambulatory Visit: Payer: Medicare Other

## 2019-08-19 ENCOUNTER — Other Ambulatory Visit: Payer: Self-pay

## 2019-08-19 DIAGNOSIS — Z170 Estrogen receptor positive status [ER+]: Secondary | ICD-10-CM | POA: Diagnosis not present

## 2019-08-19 DIAGNOSIS — C50312 Malignant neoplasm of lower-inner quadrant of left female breast: Secondary | ICD-10-CM

## 2019-08-19 DIAGNOSIS — I2510 Atherosclerotic heart disease of native coronary artery without angina pectoris: Secondary | ICD-10-CM | POA: Diagnosis not present

## 2019-08-19 DIAGNOSIS — I700 Atherosclerosis of aorta: Secondary | ICD-10-CM | POA: Diagnosis not present

## 2019-08-19 DIAGNOSIS — Z5181 Encounter for therapeutic drug level monitoring: Secondary | ICD-10-CM | POA: Diagnosis not present

## 2019-08-20 LAB — COMPREHENSIVE METABOLIC PANEL
ALT: 12 IU/L (ref 0–32)
AST: 20 IU/L (ref 0–40)
Albumin/Globulin Ratio: 1.6 (ref 1.2–2.2)
Albumin: 4.2 g/dL (ref 3.7–4.7)
Alkaline Phosphatase: 100 IU/L (ref 39–117)
BUN/Creatinine Ratio: 23 (ref 12–28)
BUN: 13 mg/dL (ref 8–27)
Bilirubin Total: 0.6 mg/dL (ref 0.0–1.2)
CO2: 27 mmol/L (ref 20–29)
Calcium: 10.2 mg/dL (ref 8.7–10.3)
Chloride: 101 mmol/L (ref 96–106)
Creatinine, Ser: 0.57 mg/dL (ref 0.57–1.00)
GFR calc Af Amer: 103 mL/min/{1.73_m2} (ref >59)
GFR calc non Af Amer: 90 mL/min/{1.73_m2} (ref >59)
Globulin, Total: 2.6 g/dL (ref 1.5–4.5)
Glucose: 82 mg/dL (ref 65–99)
Potassium: 5.4 mmol/L — ABNORMAL HIGH (ref 3.5–5.2)
Sodium: 140 mmol/L (ref 134–144)
Total Protein: 6.8 g/dL (ref 6.0–8.5)

## 2019-08-20 LAB — CBC WITH DIFFERENTIAL/PLATELET
Basophils Absolute: 0.1 10*3/uL (ref 0.0–0.2)
Basos: 1 %
EOS (ABSOLUTE): 0.1 10*3/uL (ref 0.0–0.4)
Eos: 2 %
Hematocrit: 37.2 % (ref 34.0–46.6)
Hemoglobin: 12.1 g/dL (ref 11.1–15.9)
Immature Grans (Abs): 0 10*3/uL (ref 0.0–0.1)
Immature Granulocytes: 0 %
Lymphocytes Absolute: 1.5 10*3/uL (ref 0.7–3.1)
Lymphs: 21 %
MCH: 29.2 pg (ref 26.6–33.0)
MCHC: 32.5 g/dL (ref 31.5–35.7)
MCV: 90 fL (ref 79–97)
Monocytes Absolute: 0.8 10*3/uL (ref 0.1–0.9)
Monocytes: 11 %
Neutrophils Absolute: 4.5 10*3/uL (ref 1.4–7.0)
Neutrophils: 65 %
Platelets: 333 10*3/uL (ref 150–450)
RBC: 4.14 x10E6/uL (ref 3.77–5.28)
RDW: 11.9 % (ref 11.7–15.4)
WBC: 7 10*3/uL (ref 3.4–10.8)

## 2019-08-20 LAB — LIPID PANEL
Chol/HDL Ratio: 1.8 ratio (ref 0.0–4.4)
Cholesterol, Total: 139 mg/dL (ref 100–199)
HDL: 78 mg/dL (ref >39)
LDL Chol Calc (NIH): 52 mg/dL (ref 0–99)
Triglycerides: 37 mg/dL (ref 0–149)
VLDL Cholesterol Cal: 9 mg/dL (ref 5–40)

## 2019-08-22 ENCOUNTER — Other Ambulatory Visit: Payer: Self-pay | Admitting: Hematology and Oncology

## 2019-08-24 ENCOUNTER — Encounter: Payer: Medicare Other | Admitting: Physical Therapy

## 2019-08-24 NOTE — Progress Notes (Signed)
Chief Complaint  Patient presents with  . Annual Exam    nonfasting annual exam, labs done last week. Breast exam and pelvic today. No concerns.     Molly Ross is a 78 y.o. female who presents for a complete physical.  She had a virtual AWV in April. She was seen in July with low back pain, abdominal pain and urinary frequency.  Symptoms resolved with meloxicam and robaxin. Hasn't had any recurrences. She had labs done prior to today's visit, see below.  Left breast cancer 02/2018, s/p lumpectomy and radiation. She is on letrozole and doing well, denies side side effects. She was treated with antibiotics for breast cellulitis in July. She had f/u with surgeon, who diagnosed her with lymphedema of the breast, and she has been getting physical therapy. She only has one more session, tomorrow.  She no longer has shoulder pain, since getting the therapy for the lymphedema. Last mammo was in March. She denies any breast complaints today.  CAD: She was noted to have coronary calcification in 2 vessels on CT scan in 2016, saw Dr. Wynonia Lawman and had a myocardial perfusion scan. She had no evidence of ischemia with an ejection fraction of 76%. She has been compliant with taking aspirin and statin, and denies side effects.She is doing well on 23m atorvastatin (had some issues with discomfort in leg on the initial 437mdose).She follows a lowfat, low cholesterol diet. She saw Dr. MuBettina Gavian 01/2019; she deferred another ischemia evaluation, not having angina. She continues to have DOE, unchanged, related to her COPD (which seems a little better since she isn't working anymore, less walking, less smells from the dyes of the new shirts).  Emphysema and bronchiectasis: Under the care of pulmonologist. She continues on Breo, Spiriva Respimat, flutter valve (sporadically) and hypertonic saline nebulizers (only if needed). She feels like she isat herbaseline. Last flare was in March, treated with a course of  prednisone and doxycycline.  H/o Vitamin D deficiency: s/p 12 weeks of rx replacement in February 2016. Last vitamin D levelwas 52.2 in1/2019. She continues on the same supplements (MVI and Ca+D (plain Ca in the morning, Ca+D in the evening,and Vit D 1000 IU).    Immunization History  Administered Date(s) Administered  . Influenza, High Dose Seasonal PF 01/26/2014, 10/19/2014, 08/30/2015, 08/08/2016, 08/05/2017, 09/02/2018  . Pneumococcal Conjugate-13 12/25/2015  . Pneumococcal Polysaccharide-23 12/30/2016  . Tdap 09/03/2017   Last Pap smear:12/2015, normal, no high risk HPV detected Last mammogram:02/2019 Last colonoscopy: 10/2017; 7 tubular adenomas found, and diverticulosis.  Repeat due 10/2020 (3 yr f/u). Last DEXA: 02/2018, T-1.8 at R fem neck. FRAX: Major Osteoporotic Fracture: 11.8%, Hip Fracture:3.1%. Repeat 02/2020 Dentist:twiceyearly Ophtho:yearly Exercise: Walks up and down the driveway for 15 minutes twice a week. Does some weight-bearing exercise for upper body (body weight--pushing self up on chair).  Past Medical History:  Diagnosis Date  . Atherosclerosis of aorta ([facility]2/18/2016   Noted on imaging   . Bronchiectasis ([facility]  . Bronchiectasis with (acute) exacerbation ([place]6/24/2016  . CAD (coronary artery disease), native coronary artery    Coronary calcification noted on CT scan 2016  Negative treadmill myoview 07/20/15 with EF 76%   . Cancer ([facility]  . Cellulitis of left breast 10/01/2018  . Colon polyp   . COPD, moderate ([facility] [place]11/21/2016  . Depressive disorder, not elsewhere classified 12/04/2011  . Dyspnea   . Emphysema of lung ([facility]2/18/2016  . Family history of lung cancer   .  Family history of ovarian cancer   . Family history of prostate cancer   . Genetic testing 03/27/2018   The Common Hereditary Cancer Panel offered by [facility] includes sequencing and/or deletion duplication testing of the following 47 genes: APC, ATM, AXIN2, BARD1, BMPR1A,  BRCA1, BRCA2, BRIP1, CDH1, CDKN2A (p14ARF), CDKN2A (p16INK4a), CKD4, CHEK2, CTNNA1, DICER1, EPCAM (Deletion/duplication testing only), GREM1 (promoter region deletion/duplication testing only), KIT, MEN1, MLH1, MSH2, MSH3, MSH6, MU  . GERD (gastroesophageal reflux disease)   . Hiatal hernia 02/02/2015   small, noted on CT of chest   . History of radiation therapy 04/16/18- 05/14/18   42.56 Gy directed to the Left Breast in 16 fractions followed by a boost of 8 Gy in 4 fractions  . History of smoking 25-50 pack years 06/09/2015  . Malignant neoplasm of lower-inner quadrant of left breast in female, estrogen receptor positive ([place]) 03/06/2018  . Osteopenia 02/18/2018   DEXA 02/2018, T-1.8 R femoral neck  . Pleuritic pain 10/01/2018  . Shingles 1/09, 07/2010, 11/2017  . Unspecified vitamin D deficiency 02/2011 and 2016  . Vitamin D deficiency 02/14/2011    Past Surgical History:  Procedure Laterality Date  . BREAST LUMPECTOMY WITH RADIOACTIVE SEED AND SENTINEL LYMPH NODE BIOPSY Left 03/19/2018   Procedure: BREAST LUMPECTOMY WITH RADIOACTIVE SEED AND SENTINEL LYMPH NODE BIOPSY;  Surgeon: Stark Klein, MD;  Location: [facility];  Service: General;  Laterality: Left;  . CATARACT EXTRACTION, BILATERAL Bilateral 03/2017, 04/2017   [facility], Dr. Alanda Slim  . SHOULDER SURGERY  03/2011   for frozen shoulder, and cyst removal (Dr. Percell Miller)  . TONSILLECTOMY  child  . VIDEO BRONCHOSCOPY Bilateral 06/21/2016   Procedure: VIDEO BRONCHOSCOPY WITHOUT FLUORO;  Surgeon: Brand Males, MD;  Location: [facility];  Service: Cardiopulmonary;  Laterality: Bilateral;    Social History   Socioeconomic History  . Marital status: Widowed    Spouse name: Not on file  . Number of children: Not on file  . Years of education: Not on file  . Highest education level: Not on file  Occupational History  . Occupation: [employer]  Social Needs  . Financial resource strain: Not on file  . Food insecurity     Worry: Not on file    Inability: Not on file  . Transportation needs    Medical: Not on file    Non-medical: Not on file  Tobacco Use  . Smoking status: Former Smoker    Packs/day: 2.00    Years: 24.00    Pack years: 48.00    Types: Cigarettes    Quit date: 06/28/1980    Years since quitting: 39.1  . Smokeless tobacco: Never Used  . Tobacco comment: smoked 1-2 PPD x 24 years;  Substance and Sexual Activity  . Alcohol use: Yes    Alcohol/week: 0.0 standard drinks    Comment: 1 glass of wine once a week or less  . Drug use: No  . Sexual activity: Not Currently  Lifestyle  . Physical activity    Days per week: Not on file    Minutes per session: Not on file  . Stress: Not on file  Relationships  . Social Herbalist on phone: Not on file    Gets together: Not on file    Attends religious service: Not on file    Active member of club or organization: Not on file    Attends meetings of clubs or organizations: Not on file    Relationship  status: Not on file  . Intimate partner violence    Fear of current or ex partner: Not on file    Emotionally abused: Not on file    Physically abused: Not on file    Forced sexual activity: Not on file  Other Topics Concern  . Not on file  Social History Narrative   Lives with [name] (her sister).  Widowed (2011).  Son and granddaughter live in [place]   Also has step grandson.   She works at the [employer] in [place].   Decreased to 2 days/week. (02/2019--furloughed due to COVID-19 pandemic)    Family History  Problem Relation Age of Onset  . Stroke Mother   . Diabetes Mother   . Stroke Sister 64       brainstem stroke  . Diabetes Sister   . Seizures Sister   . Lung cancer Father 70  . Ovarian cancer Sister 44       metastatic to lung, liver  . Cancer Sister   . Ovarian cancer Maternal Aunt 95  . Heart disease Maternal Grandmother   . Prostate cancer Cousin   . Lung cancer Cousin        recurrent,  now stage 4  . Breast cancer Neg Hx     Outpatient Encounter Medications as of 08/25/2019  Medication Sig Note  . Ascorbic Acid (VITAMIN C) 100 MG tablet Take 1 tablet (100 mg total) by mouth daily.   Marland Kitchen aspirin EC 81 MG tablet Take 81 mg by mouth daily.   Marland Kitchen atorvastatin (LIPITOR) 20 MG tablet Take 1 tablet (20 mg total) by mouth daily.   Marland Kitchen BREO ELLIPTA 100-25 MCG/INH AEPB TAKE 1 PUFF BY MOUTH EVERY DAY   . Calcium-Magnesium-Vitamin D (CALCIUM 500 PO) Take 1 tablet by mouth daily.   Marland Kitchen letrozole (FEMARA) 2.5 MG tablet TAKE 1 TABLET BY MOUTH EVERY DAY   . Multiple Vitamins-Minerals (MULTIVITAMIN WITH MINERALS) tablet Take 1 tablet by mouth daily.   . polyethylene glycol (MIRALAX / GLYCOLAX) 17 g packet Take 17 g by mouth daily. 08/25/2019: Uses prn, about every 3rd day  . SPIRIVA RESPIMAT 2.5 MCG/ACT AERS TAKE 2 PUFFS BY MOUTH TWICE A DAY   . ipratropium-albuterol (DUONEB) 0.5-2.5 (3) MG/3ML SOLN Take 3 mLs by nebulization every 4 (four) hours as needed. (Patient not taking: Reported on 08/25/2019)   . omeprazole (PRILOSEC OTC) 20 MG tablet Take 20 mg by mouth daily.   . sodium chloride HYPERTONIC 3 % nebulizer solution Take by nebulization as needed for other. (Patient not taking: Reported on 05/28/2019)   . [DISCONTINUED] doxycycline (VIBRA-TABS) 100 MG tablet Take 1 tablet (100 mg total) by mouth 2 (two) times daily. (Patient not taking: Reported on 08/03/2019)   . [DISCONTINUED] letrozole (FEMARA) 2.5 MG tablet TAKE 1 TABLET BY MOUTH EVERY DAY   . [DISCONTINUED] meloxicam (MOBIC) 15 MG tablet Take 1 tablet (15 mg total) by mouth daily. (Patient not taking: Reported on 08/03/2019)   . [DISCONTINUED] methocarbamol (ROBAXIN) 500 MG tablet Take 1-2 tablets (500-1,000 mg total) by mouth every 8 (eight) hours as needed for muscle spasms. (Patient not taking: Reported on 08/03/2019)    No facility-administered encounter medications on file as of 08/25/2019.     No Known Allergies  ROS: The patient denies  anorexia, fever, headaches, vision changes, decreased hearing, ear pain, sore throat, breast concerns, chest pain, palpitations, dizziness, syncope, dyspnea on exertion, cough, swelling, nausea, vomiting, diarrhea, constipation, abdominal pain, melena, hematochezia, indigestion/heartburn, hematuria,  incontinence(some stress incontinence), dysuria, vaginal bleeding, discharge, odor or itch, genital lesions, joint pains, numbness, tingling, weakness, tremor, suspicious skin lesions, depression, abnormal bleeding/bruising, or enlarged lymph nodes. Breathing isat baseline/improved. Still coughs some, sometimes productive of clear phlegm, rarely discolored. Only occasionally has heartburn, no longer belching. No longer takes PPI. Lymphedema left breast--much improved s/p PT.   PHYSICAL EXAM:  BP 118/60   Pulse 84   Temp 98.2 F (36.8 C) (Other (Comment))   Ht _0  (1.727 m)   Wt 142 lb 12.8 oz (64.8 kg)   BMI 21.71 kg/m   Wt Readings from Last 3 Encounters:  08/25/19 142 lb 12.8 oz (64.8 kg)  07/15/19 145 lb 12.8 oz (66.1 kg)  06/16/19 146 lb 6.4 oz (66.4 kg)    General Appearance:  Alert, cooperative, no distress, appears stated age  Head:  Normocephalic, without obvious abnormality, atraumatic  Eyes:  PERRL, conjunctiva/corneas clear, EOM's intact, fundi benign  Ears:  Normal TM's and external ear canals  Nose: Not examined, wearing mask due to COVID-19 pandemic  Throat: Not examined, wearing mask due to COVID-19 pandemic  Neck: Supple, no lymphadenopathy; thyroid: no enlargement/tenderness/ nodules; no carotid bruit or JVD  Back:  Spine nontender, no curvature, ROM normal, no CVA tenderness  Lungs:  Clear to auscultation bilaterally; good air movement. No wheezes, orronchi, some slight crackles at right base; respirations unlabored. Some wet-sounding cough noted only when supine.  Chest Wall:  No tenderness or deformity  Heart:  Regular rate and rhythm,  S1 and S2 normal, no murmur, rub or gallop.   Breast Exam:  No tenderness, masses, or nipple discharge or inversion on the right. WHSS in upper breast.  Left breast has WHSS. There is slightly yellow discoloration to the breast, diffusely tender, mostly laterally No axillary lymphadenopathy.  No masses.  Abdomen:  Soft, non-tender, nondistended, normoactive bowel sounds, no masses, no hepatosplenomegaly  Genitalia:  Normal external genitalia without lesions, some atrophy noted. BUS and vagina normal;nocervical motion tenderness. No abnormal vaginal discharge. Uterus and adnexa not enlarged, nontender, no masses. Papnotperformed  Rectal:  Normal sphincter tone, no masses.  Heme negative stool  Extremities: No clubbing, cyanosis or edema  Pulses: 2+ and symmetric all extremities  Skin: Skin color, texture, turgor normal, no lesions. She has many seborrheic keratoses throughout her body many of which are large. There are also many scattered angiomas. Left mid-shin --hyperkeratotic lesions--1 is 70m (and white), central one is 4x735m and slightly gray. (both are slightly larger than last year). She had a large bruise at her left forearm (recent injury), and purpura on the left forearm.  Lymph nodes: Cervical, supraclavicular, and axillary nodes normal  Neurologic: CNII-XII intact, normal strength, sensation and gait; reflexes 2+ and symmetric throughout  Psych: Normal mood, affect, hygiene and grooming     Chemistry      Component Value Date/Time   NA 140 08/19/2019 0857   K 5.4 (H) 08/19/2019 0857   CL 101 08/19/2019 0857   CO2 27 08/19/2019 0857   BUN 13 08/19/2019 0857   CREATININE 0.57 08/19/2019 0857   CREATININE 0.69 03/11/2018 0848   CREATININE 0.56 (L) 09/01/2017 1307      Component Value Date/Time   CALCIUM 10.2 08/19/2019 0857   ALKPHOS 100 08/19/2019 0857   AST 20 08/19/2019 0857   AST 18 03/11/2018 0848   ALT 12  08/19/2019 0857   ALT 11 03/11/2018 0848   BILITOT 0.6 08/19/2019 0857   BILITOT 1.0 03/11/2018 0848  Fasting glucose 82  Lab Results  Component Value Date   CHOL 139 08/19/2019   HDL 78 08/19/2019   LDLCALC 66 08/19/2018   TRIG 37 08/19/2019   CHOLHDL 1.8 08/19/2019   Lab Results  Component Value Date   WBC 7.0 08/19/2019   HGB 12.1 08/19/2019   HCT 37.2 08/19/2019   MCV 90 08/19/2019   PLT 333 08/19/2019     ASSESSMENT/PLAN:  Annual physical exam  Coronary artery disease involving native coronary artery of native heart without angina pectoris - asymptomatic. Cont ASA, statin  Atherosclerosis of aorta (HCC) - cont ASA, statin  COPD, moderate (HCC) - stable on current regimen  Bronchiectasis without complication (HCC)  Vitamin D deficiency - cont current supplements  Need for influenza vaccination - Plan: Flu Vaccine QUAD High Dose(Fluad)  Osteopenia of neck of right femur - DEXA due again 02/2020. Discussed Ca, D, Weight-bearing exercise - Plan: DG Bone Density  Senile purpura ([place])   Hyperkalemia--chart reviewed, has been up and down, was 5.5 a year ago, but had gone back down to normal. No change to her diet, denies difficult stick.   Discussed monthly self breast exams and yearly mammograms; at least 30 minutes of aerobic activity at least 5 days/week and weight-bearing exercise 2x/week; proper sunscreen use reviewed; healthy diet, including goals of calcium and vitamin D intake and alcohol recommendations (less than or equal to 1 drink/day) reviewed; regular seatbelt use; changing batteries in smoke detectors. Immunization recommendations discussed--Shingrix recommended, to get from pharmacy (risks/SE reviewed). Continue yearly high dose flu shots, given today.ColonoscopyUTD, repeat due 10/2020.  DEXA due 02/2020.  F/u 1 year (AWV/CPE), sooner pern.

## 2019-08-24 NOTE — Patient Instructions (Addendum)
  HEALTH MAINTENANCE RECOMMENDATIONS:  It is recommended that you get at least 30 minutes of aerobic exercise at least 5 days/week (for weight loss, you may need as much as 60-90 minutes). This can be any activity that gets your heart rate up. This can be divided in 10-15 minute intervals if needed, but try and build up your endurance at least once a week.  Weight bearing exercise is also recommended twice weekly.  Eat a healthy diet with lots of vegetables, fruits and fiber.  "Colorful" foods have a lot of vitamins (ie green vegetables, tomatoes, red peppers, etc).  Limit sweet tea, regular sodas and alcoholic beverages, all of which has a lot of calories and sugar.  Up to 1 alcoholic drink daily may be beneficial for women (unless trying to lose weight, watch sugars).  Drink a lot of water.  Calcium recommendations are 1200-1500 mg daily (1500 mg for postmenopausal women or women without ovaries), and vitamin D 1000 IU daily.  This should be obtained from diet and/or supplements (vitamins), and calcium should not be taken all at once, but in divided doses.  Monthly self breast exams and yearly mammograms for women over the age of 14 is recommended.  Sunscreen of at least SPF 30 should be used on all sun-exposed parts of the skin when outside between the hours of 10 am and 4 pm (not just when at beach or pool, but even with exercise, golf, tennis, and yard work!)  Use a sunscreen that says "broad spectrum" so it covers both UVA and UVB rays, and make sure to reapply every 1-2 hours.  Remember to change the batteries in your smoke detectors when changing your clock times in the spring and fall. Carbon monoxide detectors are recommended for your home.  Use your seat belt every time you are in a car, and please drive safely and not be distracted with cell phones and texting while driving.  I recommend getting the new shingles vaccine (Shingrix). You will need to get this from the pharmacy rather than  our office, as it is covered by Medicare Part D.  It is a series of 2 injections, spaced 2 months apart.  Next bone density test will be due in 02/2020. I entered the order so that you can call and schedule it (at [facility] shortly after your next mammogram.  Next colonoscopy will be due 10/2020

## 2019-08-25 ENCOUNTER — Other Ambulatory Visit: Payer: Self-pay

## 2019-08-25 ENCOUNTER — Ambulatory Visit (INDEPENDENT_AMBULATORY_CARE_PROVIDER_SITE_OTHER): Payer: Medicare Other | Admitting: Family Medicine

## 2019-08-25 ENCOUNTER — Encounter: Payer: Self-pay | Admitting: Hematology and Oncology

## 2019-08-25 ENCOUNTER — Encounter: Payer: Self-pay | Admitting: Family Medicine

## 2019-08-25 ENCOUNTER — Telehealth: Payer: Self-pay | Admitting: Hematology and Oncology

## 2019-08-25 VITALS — BP 118/60 | HR 84 | Temp 98.2°F | Ht 68.0 in | Wt 142.8 lb

## 2019-08-25 DIAGNOSIS — D692 Other nonthrombocytopenic purpura: Secondary | ICD-10-CM | POA: Insufficient documentation

## 2019-08-25 DIAGNOSIS — Z0000 Encounter for general adult medical examination without abnormal findings: Secondary | ICD-10-CM | POA: Diagnosis not present

## 2019-08-25 DIAGNOSIS — J449 Chronic obstructive pulmonary disease, unspecified: Secondary | ICD-10-CM | POA: Diagnosis not present

## 2019-08-25 DIAGNOSIS — I2510 Atherosclerotic heart disease of native coronary artery without angina pectoris: Secondary | ICD-10-CM

## 2019-08-25 DIAGNOSIS — M85851 Other specified disorders of bone density and structure, right thigh: Secondary | ICD-10-CM

## 2019-08-25 DIAGNOSIS — Z23 Encounter for immunization: Secondary | ICD-10-CM | POA: Diagnosis not present

## 2019-08-25 DIAGNOSIS — I700 Atherosclerosis of aorta: Secondary | ICD-10-CM

## 2019-08-25 DIAGNOSIS — J479 Bronchiectasis, uncomplicated: Secondary | ICD-10-CM

## 2019-08-25 DIAGNOSIS — E559 Vitamin D deficiency, unspecified: Secondary | ICD-10-CM

## 2019-08-25 NOTE — Telephone Encounter (Signed)
Scheduled appt per 9/8 sch messag e- unable to reach pt  . Left message with appt date and time   

## 2019-08-26 ENCOUNTER — Encounter: Payer: Medicare Other | Admitting: Physical Therapy

## 2019-09-01 ENCOUNTER — Ambulatory Visit: Payer: Medicare Other | Admitting: Physical Therapy

## 2019-09-23 IMAGING — MG DIGITAL SCREENING BILATERAL MAMMOGRAM WITH TOMO AND CAD
8 series · 9 of 24 positions shown · non-contrast
Comparison: Previous exam(s).

CLINICAL DATA: Screening.

EXAM:
DIGITAL SCREENING BILATERAL MAMMOGRAM WITH TOMO AND CAD

[R MLO synth-2D]
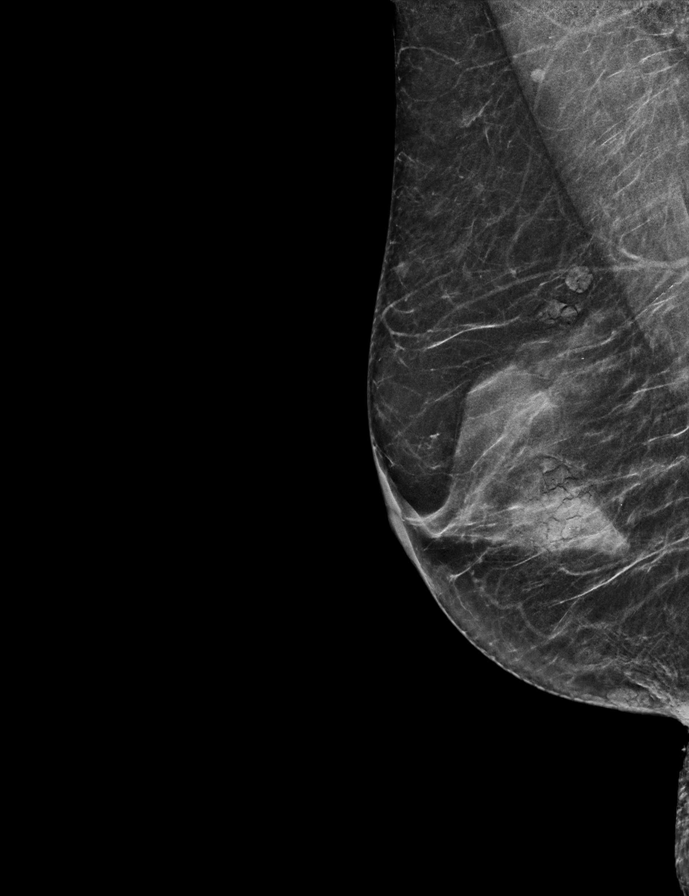

[L MLO synth-2D]
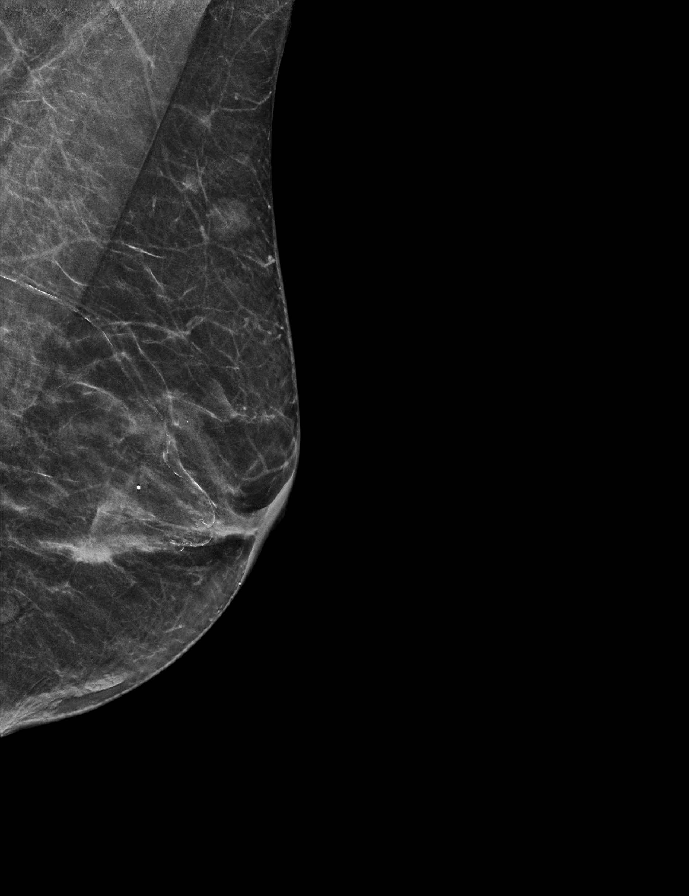

[R CC synth-2D]
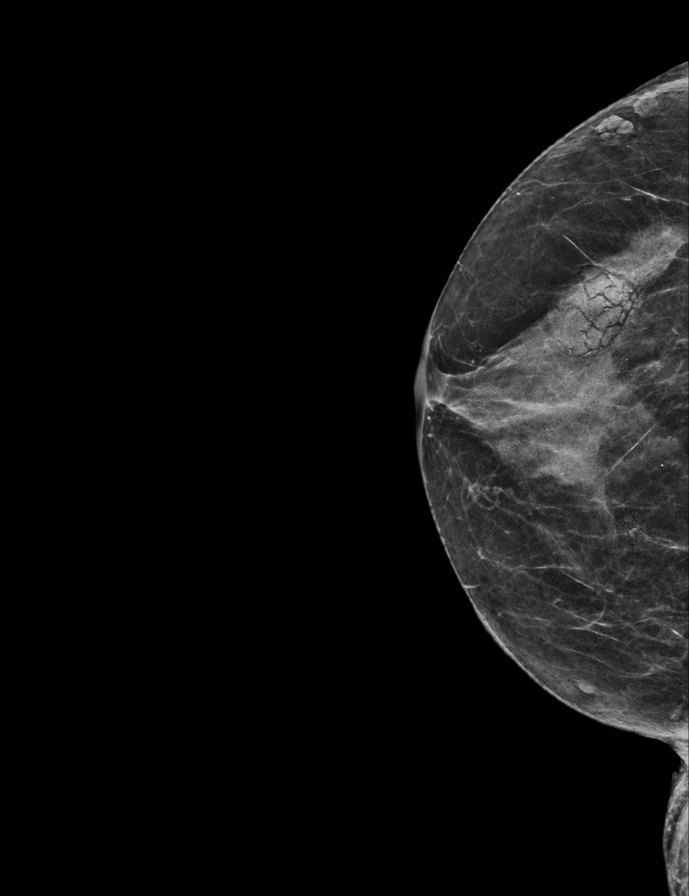

[L CC synth-2D]
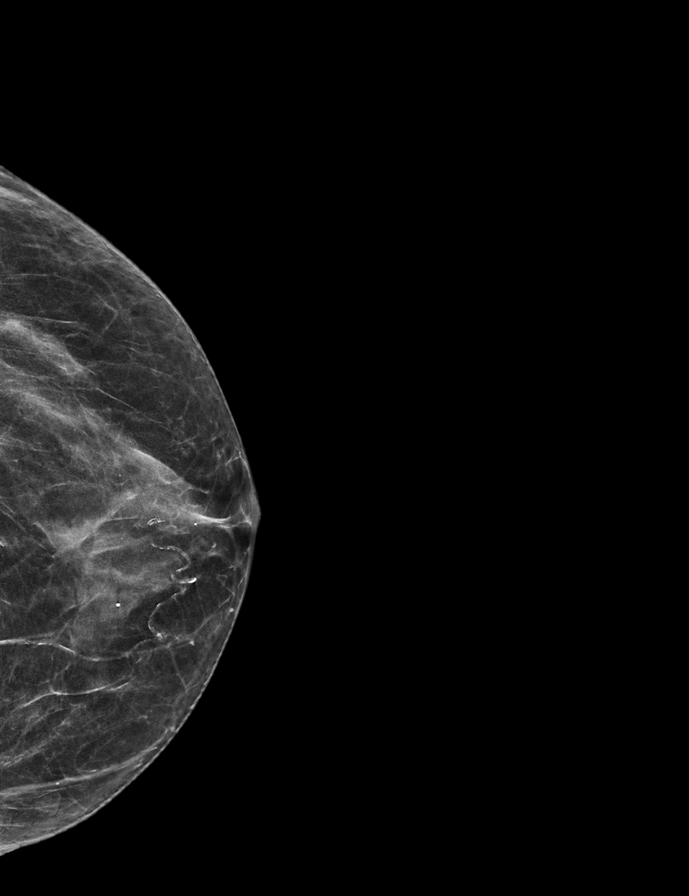

[L MLO tomo · 2 of 51 frames shown]
[frame 17/51]
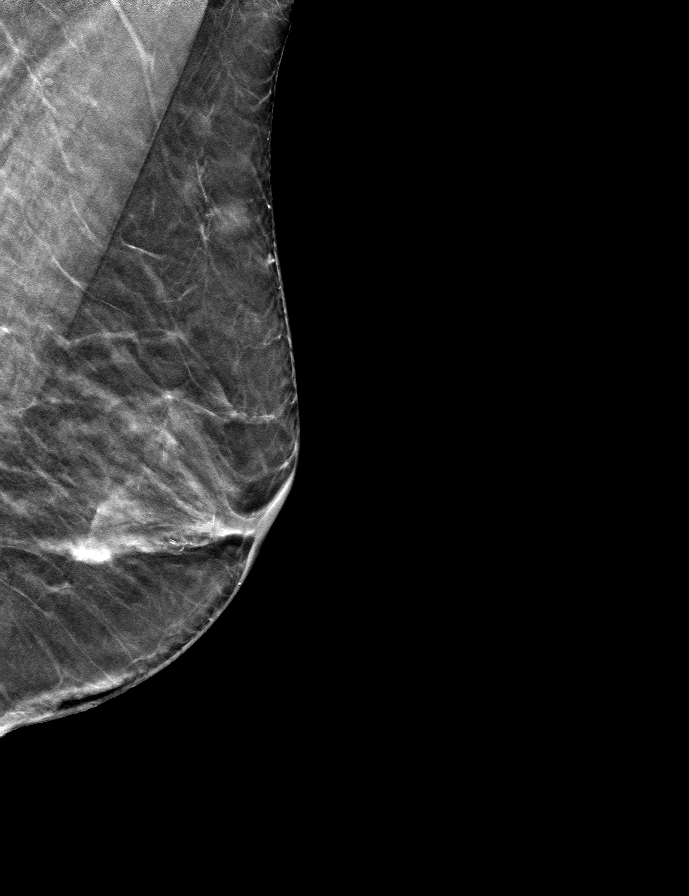
[frame 26/51]
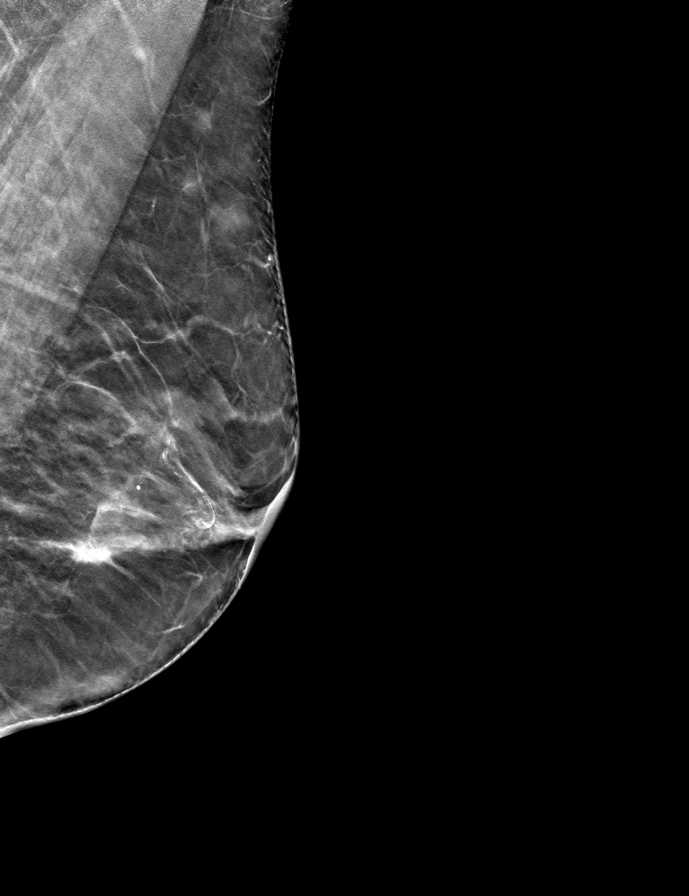

[R CC tomo · tomo slice 25/48.0]
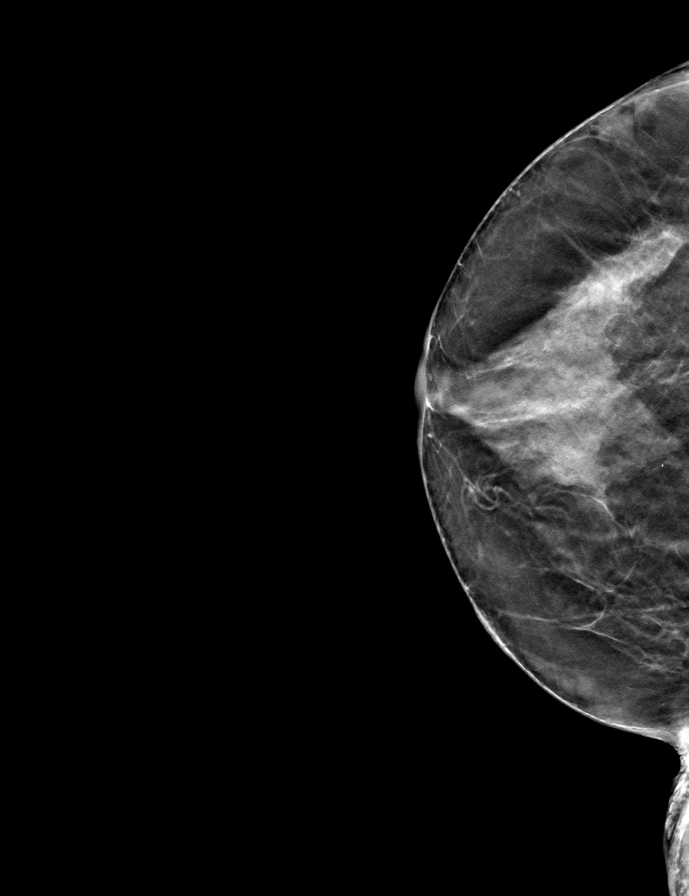

[L CC tomo · tomo slice 25/49.0]
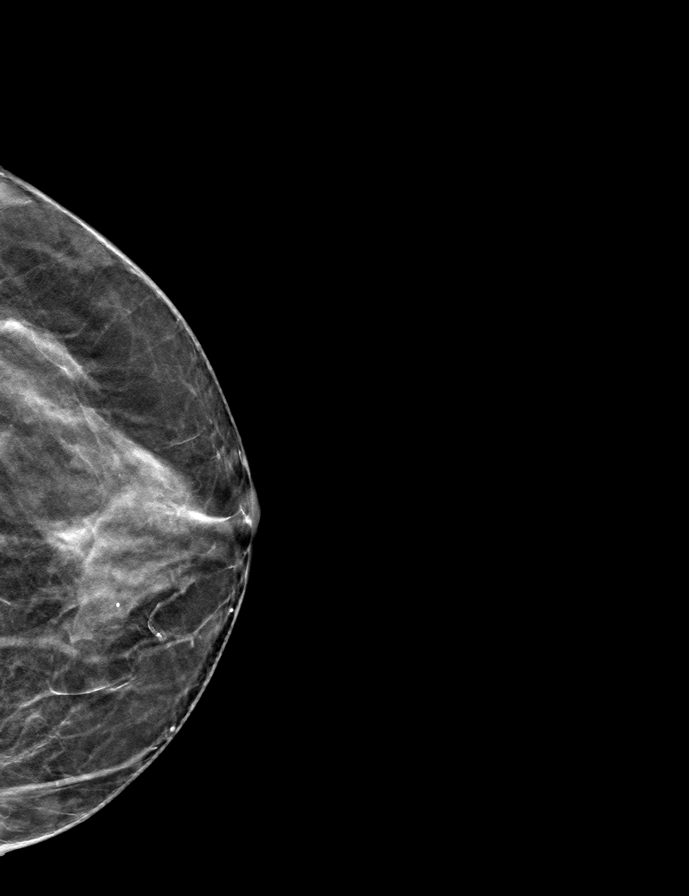

[R MLO tomo · tomo slice 28/55.0]
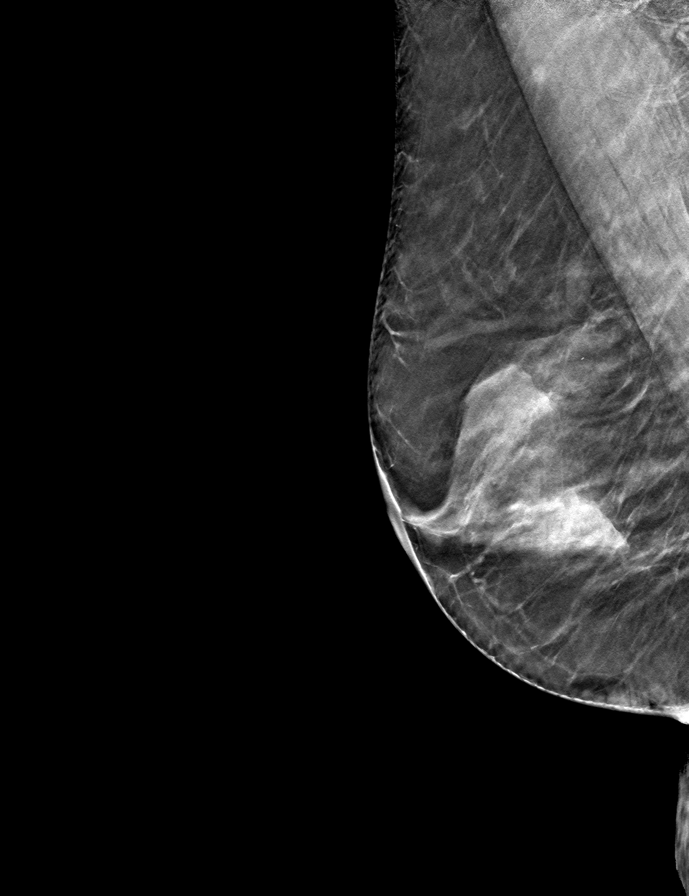

[9 of 24 positions shown; findings below may reference images not displayed]

ACR Breast Density Category c: The breast tissue is heterogeneously
dense, which may obscure small masses.
FINDINGS: In the left breast, a possible mass warrants further evaluation. In
the right breast, no findings suspicious for malignancy.

Images were processed with CAD.
IMPRESSION: Further evaluation is suggested for possible mass in the left
breast.

RECOMMENDATION:
Diagnostic mammogram and possibly ultrasound of the left breast.
(Code:[id number])

The patient will be contacted regarding the findings, and additional
imaging will be scheduled.

BI-RADS CATEGORY  0: Incomplete. Need additional imaging evaluation
and/or prior mammograms for comparison.

## 2019-10-06 ENCOUNTER — Other Ambulatory Visit: Payer: Self-pay | Admitting: Family Medicine

## 2019-10-06 DIAGNOSIS — I700 Atherosclerosis of aorta: Secondary | ICD-10-CM

## 2019-10-25 ENCOUNTER — Ambulatory Visit: Payer: Medicare Other | Admitting: Hematology and Oncology

## 2019-10-25 NOTE — Progress Notes (Signed)
Patient Care Team: Rita Ohara, MD as PCP - General (Family Medicine) Stark Klein, MD as Consulting Physician (General Surgery) Nicholas Lose, MD as Consulting Physician (Hematology and Oncology) Eppie Gibson, MD as Attending Physician (Radiation Oncology) Gardenia Phlegm, NP as Nurse Practitioner (Hematology and Oncology) Jacolyn Reedy, MD as Consulting Physician (Cardiology)  DIAGNOSIS:    ICD-10-CM   1. Malignant neoplasm of lower-inner quadrant of left breast in female, estrogen receptor positive ([place])  C50.312    Z17.0     SUMMARY OF ONCOLOGIC HISTORY: Oncology History  Malignant neoplasm of lower-inner quadrant of left breast in female, estrogen receptor positive ([place])  03/06/2018 Initial Diagnosis   Screening detected left breast mass at 8 o'clock position 1.2 cm in size lower inner quadrant axilla is negative biopsy revealed grade 2 invasive ductal carcinoma ER 100%, PR 90%, Ki-67 5%, HER-2 negative, T1 be N0 stage I a clinical stage   03/19/2018 Surgery   Left Lumpectomy: IDC Grade 1, 1.3 cm, 1/3 LN Positive, with LVI, T1cN1 ER 100%, PR 90%, Ki-67 5%, HER-2 negative Stage 1B   03/21/2018 Genetic Testing   The Common Hereditary Cancer Panel offered by [facility] includes sequencing and/or deletion duplication testing of the following 47 genes: APC, ATM, AXIN2, BARD1, BMPR1A, BRCA1, BRCA2, BRIP1, CDH1, CDKN2A (p14ARF), CDKN2A (p16INK4a), CKD4, CHEK2, CTNNA1, DICER1, EPCAM (Deletion/duplication testing only), GREM1 (promoter region deletion/duplication testing only), KIT, MEN1, MLH1, MSH2, MSH3, MSH6, MUTYH, NBN, NF1, NHTL1, PALB2, PDGFRA, PMS2, POLD1, POLE, PTEN, RAD50, RAD51C, RAD51D, SDHB, SDHC, SDHD, SMAD4, SMARCA4. STK11, TP53, TSC1, TSC2, and VHL.  The following genes were evaluated for sequence changes only: SDHA and HOXB13 c.251G>A variant only.  Results: No pathogenic variants identified.   A variant of uncertain significance in the gene SMARCA4 was identified  c.5011C>T ([id number]).  The date of this test report is 03/21/2018   04/10/2018 Cancer Staging   Staging form: Breast, AJCC 8th Edition - Pathologic: Stage IA (pT1c, pN1a, cM0, G1, ER+, PR+, HER2-) - Signed by Eppie Gibson, MD on 04/10/2018   04/16/2018 - 05/14/2018 Radiation Therapy   Adjuvant radiation therapy   05/2018 -  Anti-estrogen oral therapy   Letrozole daily     CHIEF COMPLIANT: Follow-up on antiestrogen therapy with letrozole  INTERVAL HISTORY: Molly Ross is a 78 y.o. with above-mentioned history of left breast cancer treated with lumpectomy, radiation, and who is currently on anti-estrogen therapy with letrozole. Mammogram on 03/03/19 showed no evidence of malignancy bilaterally. She presents to the clinic today for follow-up.  She is tolerating letrozole extremely well without any problems or concerns.  Denies any hot flashes.  She does have mild stiffness in her joints but she does not think it is related to antiestrogen therapy.  Denies any lumps or nodules in the breast.  REVIEW OF SYSTEMS:   Constitutional: Denies fevers, chills or abnormal weight loss Eyes: Denies blurriness of vision Ears, nose, mouth, throat, and face: Denies mucositis or sore throat Respiratory: Denies cough, dyspnea or wheezes Cardiovascular: Denies palpitation, chest discomfort Gastrointestinal: Denies nausea, heartburn or change in bowel habits Skin: Denies abnormal skin rashes Lymphatics: Denies new lymphadenopathy or easy bruising Neurological: Denies numbness, tingling or new weaknesses Behavioral/Psych: Mood is stable, no new changes  Extremities: No lower extremity edema Breast: denies any pain or lumps or nodules in either breasts All other systems were reviewed with the patient and are negative.  I have reviewed the past medical history, past surgical history, social history and family history with the  patient and they are unchanged from previous note.  ALLERGIES:  has No Known  Allergies.  MEDICATIONS:  Current Outpatient Medications  Medication Sig Dispense Refill  . Ascorbic Acid (VITAMIN C) 100 MG tablet Take 1 tablet (100 mg total) by mouth daily.    Marland Kitchen aspirin EC 81 MG tablet Take 81 mg by mouth daily.    Marland Kitchen atorvastatin (LIPITOR) 20 MG tablet TAKE 1 TABLET BY MOUTH EVERY DAY 90 tablet 1  . BREO ELLIPTA 100-25 MCG/INH AEPB TAKE 1 PUFF BY MOUTH EVERY DAY 60 each 3  . Calcium-Magnesium-Vitamin D (CALCIUM 500 PO) Take 1 tablet by mouth daily.    Marland Kitchen ipratropium-albuterol (DUONEB) 0.5-2.5 (3) MG/3ML SOLN Take 3 mLs by nebulization every 4 (four) hours as needed. (Patient not taking: Reported on 08/25/2019) 360 mL 2  . letrozole (FEMARA) 2.5 MG tablet TAKE 1 TABLET BY MOUTH EVERY DAY 90 tablet 0  . Multiple Vitamins-Minerals (MULTIVITAMIN WITH MINERALS) tablet Take 1 tablet by mouth daily.    Marland Kitchen omeprazole (PRILOSEC OTC) 20 MG tablet Take 20 mg by mouth daily.    . polyethylene glycol (MIRALAX / GLYCOLAX) 17 g packet Take 17 g by mouth daily.    . sodium chloride HYPERTONIC 3 % nebulizer solution Take by nebulization as needed for other. (Patient not taking: Reported on 05/28/2019) 750 mL 2  . SPIRIVA RESPIMAT 2.5 MCG/ACT AERS TAKE 2 PUFFS BY MOUTH TWICE A DAY 18 g 3   No current facility-administered medications for this visit.     PHYSICAL EXAMINATION: ECOG PERFORMANCE STATUS: 1 - Symptomatic but completely ambulatory  Vitals:   10/26/19 1436  BP: (!) 143/60  Pulse: 80  Resp: 17  Temp: 99.1 F (37.3 C)  SpO2: 98%   Filed Weights   10/26/19 1436  Weight: 147 lb 3.2 oz (66.8 kg)    GENERAL: alert, no distress and comfortable SKIN: skin color, texture, turgor are normal, no rashes or significant lesions EYES: normal, Conjunctiva are pink and non-injected, sclera clear OROPHARYNX: no exudate, no erythema and lips, buccal mucosa, and tongue normal  NECK: supple, thyroid normal size, non-tender, without nodularity LYMPH: no palpable lymphadenopathy in the  cervical, axillary or inguinal LUNGS: clear to auscultation and percussion with normal breathing effort HEART: regular rate & rhythm and no murmurs and no lower extremity edema ABDOMEN: abdomen soft, non-tender and normal bowel sounds MUSCULOSKELETAL: no cyanosis of digits and no clubbing  NEURO: alert & oriented x 3 with fluent speech, no focal motor/sensory deficits EXTREMITIES: No lower extremity edema BREAST: No palpable masses or nodules in either right or left breasts. No palpable axillary supraclavicular or infraclavicular adenopathy no breast tenderness or nipple discharge. (exam performed in the presence of a chaperone)  LABORATORY DATA:  I have reviewed the data as listed CMP Latest Ref Rng & Units 08/19/2019 09/30/2018 08/19/2018  Glucose 65 - 99 mg/dL 82 111(H) 79  BUN 8 - 27 mg/dL _0 Creatinine 0.57 - 1.00 mg/dL 0.57 0.57 0.59  Sodium 134 - 144 mmol/L 140 141 142  Potassium 3.5 - 5.2 mmol/L 5.4(H) 3.8 5.5(H)  Chloride 96 - 106 mmol/L 101 102 101  CO2 20 - 29 mmol/L 27 33(H) 27  Calcium 8.7 - 10.3 mg/dL 10.2 10.4 9.8  Total Protein 6.0 - 8.5 g/dL 6.8 - 7.3  Total Bilirubin 0.0 - 1.2 mg/dL 0.6 - 0.8  Alkaline Phos 39 - 117 IU/L 100 - 84  AST 0 - 40 IU/L 20 - 19  ALT  0 - 32 IU/L 12 - 10    Lab Results  Component Value Date   WBC 7.0 08/19/2019   HGB 12.1 08/19/2019   HCT 37.2 08/19/2019   MCV 90 08/19/2019   PLT 333 08/19/2019   NEUTROABS 4.5 08/19/2019    ASSESSMENT & PLAN:  Malignant neoplasm of lower-inner quadrant of left breast in female, estrogen receptor positive ([facility]) 03/19/18:Left Lumpectomy: IDC Grade 1, 1.3 cm, 1/3 LN Positive, with LVI, T1cN1 ER 100%, PR 90%, Ki-67 5%, HER-2 negative Stage 1B Adjuvant radiation therapy 04/16/2018 to 05/14/2018  Current treatment: letrozole 2.5 mg daily times 5-7 years started 05/15/2019 Letrozole toxicities:  Breast cancer surveillance: 1.  Breast exam 10/26/2019: Benign 2.  Mammogram 08/02/2019: Benign breast  density category C  Return to clinic in 1 year for follow-up    No orders of the defined types were placed in this encounter.  The patient has a good understanding of the overall plan. she agrees with it. she will call with any problems that may develop before the next visit here.  Nicholas Lose, MD 10/26/2019  Julious Oka Dorshimer am acting as scribe for Dr. Nicholas Lose.  I have reviewed the above documentation for accuracy and completeness, and I agree with the above.

## 2019-10-26 ENCOUNTER — Inpatient Hospital Stay: Payer: Medicare Other | Attending: Hematology and Oncology | Admitting: Hematology and Oncology

## 2019-10-26 ENCOUNTER — Other Ambulatory Visit: Payer: Self-pay

## 2019-10-26 DIAGNOSIS — Z170 Estrogen receptor positive status [ER+]: Secondary | ICD-10-CM

## 2019-10-26 DIAGNOSIS — C50312 Malignant neoplasm of lower-inner quadrant of left female breast: Secondary | ICD-10-CM | POA: Diagnosis not present

## 2019-10-26 DIAGNOSIS — Z79811 Long term (current) use of aromatase inhibitors: Secondary | ICD-10-CM | POA: Insufficient documentation

## 2019-10-26 DIAGNOSIS — Z79899 Other long term (current) drug therapy: Secondary | ICD-10-CM | POA: Insufficient documentation

## 2019-10-26 MED ORDER — LETROZOLE 2.5 MG PO TABS: 2.5000 mg | ORAL_TABLET | Freq: Every day | ORAL | 3 refills | Status: DC

## 2019-10-26 NOTE — Assessment & Plan Note (Signed)
03/19/18:Left Lumpectomy: IDC Grade 1, 1.3 cm, 1/3 LN Positive, with LVI, T1cN1 ER 100%, PR 90%, Ki-67 5%, HER-2 negative Stage 1B Adjuvant radiation therapy 04/16/2018 to 05/14/2018  Current treatment: letrozole 2.5 mg daily times 5-7 years started 05/15/2019 Letrozole toxicities:  Breast cancer surveillance: 1.  Breast exam 10/26/2019: Benign 2.  Mammogram 08/02/2019: Benign breast density category C  Return to clinic in 1 year for follow-up

## 2019-10-27 ENCOUNTER — Telehealth: Payer: Self-pay | Admitting: Hematology and Oncology

## 2019-10-27 NOTE — Telephone Encounter (Signed)
I talk with patient regarding schedule  

## 2019-12-25 ENCOUNTER — Encounter: Payer: Self-pay | Admitting: Family Medicine

## 2020-01-01 ENCOUNTER — Other Ambulatory Visit: Payer: Self-pay | Admitting: Internal Medicine

## 2020-01-17 ENCOUNTER — Encounter: Payer: Self-pay | Admitting: Internal Medicine

## 2020-01-17 ENCOUNTER — Other Ambulatory Visit: Payer: Self-pay

## 2020-01-17 ENCOUNTER — Ambulatory Visit: Payer: Medicare Other | Admitting: Internal Medicine

## 2020-01-17 VITALS — BP 126/68 | HR 77 | Temp 97.0°F | Ht 69.0 in | Wt 146.2 lb

## 2020-01-17 DIAGNOSIS — K449 Diaphragmatic hernia without obstruction or gangrene: Secondary | ICD-10-CM | POA: Diagnosis not present

## 2020-01-17 DIAGNOSIS — K219 Gastro-esophageal reflux disease without esophagitis: Secondary | ICD-10-CM

## 2020-01-17 DIAGNOSIS — J479 Bronchiectasis, uncomplicated: Secondary | ICD-10-CM

## 2020-01-17 DIAGNOSIS — R911 Solitary pulmonary nodule: Secondary | ICD-10-CM | POA: Diagnosis not present

## 2020-01-17 DIAGNOSIS — J449 Chronic obstructive pulmonary disease, unspecified: Secondary | ICD-10-CM | POA: Diagnosis not present

## 2020-01-17 NOTE — Progress Notes (Signed)
IOV 06/09/2015  Chief Complaint  Patient presents with  . Pulmonary Consult    Pt referred by Dr. Tomi Bamberger for abnormal CT.    79 year old female somewhat of a poor historian. History is gained from repeatedly asking the same questions in many different ways and also review of Dr Rita Ohara notes in the EMR   She reports that she is a 40 pack smoker having quit 34 years ago. In the 1970s and 1980s she had recurrent bouts of pneumonia which left her with bronchiectasis. At baseline she's had some chronic cough of unclear severity and shortness of breath with exertion of unclear severity but probably mild. Unclear if she has associated sputum. Unclear what aggravating and relieving factors where or are. Then in for a 2016 she developed over several weeks worsening cough with green sputum and some fever. Apparently was diagnosed with pneumonia. This then resulted in a CT scan of the chest and her 2016 which I personally reviewed and shows bilateral bronchiectasis particularly on the right middle lobe. She also had a nodule. Antibody treatment she improved. Then in April 2016 was started on Brio which inhaler therapy help her cough and shortness of breath significantly. Currently these are not much much of an issue for her. She has a good quality of life with the Brio. The Brio is expensive. She had follow-up CT scan of the chest 04/26/2015 that shows resolution of the nodule but persistence of the bronchiectasis. In fact she seems to have chronic bronchiectasis dating back on several chest x-rays although it back to 2009. She had test that she's had bronchiectasis since the 1980s. Currently no sputum production not much of a cough or shortness of breath. She denies having had bronchoscopy for the same.  She is not interested in much testing  Noted spirometry pre-breo March 2016  - fev 1.14L/45%, R 56 - severe obstructive lung disease And post Breo in June 05 2015  - Fev 1.32L/52%, R 57 ->  significantly improved but still in moderate category   OV 11/06/2015  Chief Complaint  Patient presents with  . Follow-up    Pt here after PFT. Pt states she respiratory infection in October and was on 2 rounds of abx and is now back to her baseline. Pt c/o mild non prod cough. Pt denies CP/tightness.    Previous heavy smoker with right middle lobe chronic bronchiectasis with findings suggestive of MAI and moderate/severe obstructive lung disease with findings of emphysema on CT chest - February and May 2016  - Last seen in June 2016. At that time he started Brio. Overall she has been doing well. However in October 2016 she had a respiratory exacerbation. She was treated with 2 rounds of anabiotic's and prednisone course and then she fell baseline. She is followed up with a pulmonary function test  t 10/30/2015) baseline and well. 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 Currently she is feeling well. Review of vaccine shows she is up-to-date with flu shot. She thinks she has had the pneumonia vaccine with her primary care physician but she's not sure and she is going to check with her.  No new issues.  OV 02/12/2016  Chief Complaint  Patient presents with  . Follow-up    Pt reports having some increased breathing issues since last OV- treated a few times with abx/pred for infections. Most recent x 1  week ago- Zpak w/Pred taper. Pt notes some green mucus but cough is much improved.   Heavy smoker with right middle lobe chronic bronchiectasis and some lingular bronchiectasis and scattered infiltrates in the lower lobe based on CT February and May 2016. May 2016 with improvement   - This is a routine follow-up. Last seen November 2016. She is compliant with her inhalers. In the interim approximately on Valentine's Day 2017 had another flareup and he called in Z-Pak and prednisone. This has  helped. Cough is significantly worse but it has improved although not at baseline. She slowly getting better. This will be her third exacerbation. Review of the chart shows an visualization of the CT chest which I did myself and showed her shows findings described above. Chart review also shows that do not had a complete bronchiectasis hematological and etiological workup. She is open to having bronchoscopy   OV 04/23/2016  Chief Complaint  Patient presents with  . Follow-up    Pt here after CT chest. Pt states her breathing has improved since last OV. Pt states she is still coughing in the morning but now there is less mucus production.      Heavy smoker with right middle lobe chronic bronchiectasis and some lingular bronchiectasis and scattered infiltrates in the lower lobe based on CT February and May 2016. PFT Nov 2016 -> fev 1.36L/51%, R 60. DLCO 16/51%   In  Nov 2016 I added spiriva and now on triple inhaler Rx- says with this symptoms she is significantly better. Still with cough. Some spuitum +. Labs for etiology reviewed done feb 2017 -> IgG, IgA, IgM - normal. Allpha 1AT - normal. Autoimmune - ANA, CCP, dsDNA, RF, ENA, SSA, SSB, scl-70, RNP - negative.    OV 07/01/2016  Chief Complaint  Patient presents with  . Follow-up    Follow up bronchoscopy.  pt c/o R sided back pain X3 days, nonprod cough.      Idiopathic bronchiectasis. Here to review results. Underwent bronchoscopy with lavage 06/21/2016 that showed acute neutrophils 91%. Culture was negative. AFB smear is negative fungal smear was negative. AFB culture is pending. Bronchoscopy showed a lot of mucus. She says that when she lies on she has significant cough and mucus. She feels that after bronchoscopy she improved because of the removal of mucus but shortly after this for the last few days she's having right infrascapular pleuritic and off chest tightness that is consistent with previous exacerbation and she feels she needs  an antibiotic and prednisone course. She is compliant with Spiriva and Brio at this point. Overall even at baseline she admits to copious white mucus.   OV 09/16/2016  Chief Complaint  Patient presents with  . Follow-up    Pt states that she does have some lung congestion, little cough and wheezing with SOB. Pt states that she is trying to learn when to "rest" and not overdo herself. Needs refills Breo and Spiriva Resp.    Follow-up idiopathic bronchiectasis. Last visit mid July 2017. After that called in 09/04/2016 with symptoms of bronchiectasis exacerbation. Called in Z-Pak and prednisone. She is back to baseline. At this point in time she tells me that she does not have daily mucus other than mild amounts. She does admit that she is noncompliant with physician with chest physical therapy. She recollects that one time she position herself in an inversion table for back therapy and this cleared of the mucus and she felt much better. She admits that she  needs to do a better job with positional therapy. She is up-to-date with flu shot. She continues on Spiriva and Brio triple inhaler therapy. There are no new issues.   OV 04/24/2017  Chief Complaint  Patient presents with  . Acute Visit    Pt c/o hoarseness, prod cough with green mucus, chest tightness x 2 days. Pt denies f/c/s and increase in SOB.   Acute visit for this patient with idiopathic bronchiectasis.  December 2017 she saw Dr. Baltazar Apo with an acute exacerbation. After that she went back to baseline. She does not use chest physical therapy but she uses inhaler therapy only. Now for the last 2 days she's having increased cough with chest tightness and green mucous. Baseline is clear yellow mucus. Low-grade fever present in the office. No wheezing or increased shortness of breath. Significant hoarseness of voice present and some postnasal drip present. She feels she is in bronchiectasis exacerbation. There are no other issues. It is  noted that in July 2017 bronchoscopy did not reveal any organisms on lavage    03/17/2018 Follow up : Bronchiectasis /COPD  Returns for a follow-up for bronchiectasis/COPD .  Says that she has been doing well over the last year.  She had no increased cough or congestion.  No recent antibiotic use.  She denies any shortness of breath, hemoptysis, weight loss, edema.  She does have an occasional cough that is minimally productive.  She is very active and independent.  She remains on BREO daily .,  Does depend on samples.  Has not taken Spiriva in >1 year. Has not felt that she needed this.  Recently diagnosed left breast cancer. Plans for surgery later this week.    OV 09/02/2018  Subjective:  Patient ID: Molly Ross, female , DOB: [birth year] , age 39 y.o. , MRN: [id number] , ADDRESS: [place] [id number]   09/02/2018 -   Chief Complaint  Patient presents with  . Follow-up    Pt states she has been doing good since last visit except she does have complaints of fatigue and is coughing with occ green to yellow mucus. States cough is worse in the mornings.   79 year old female former smoker followed for COPD and Bronchiectasis. 10/30/2015) baseline and well. Post broncho-dilator FEV1 1.36 L/51% and a ratio 60, total lung capacity 5.2 L/90%, DLCO 16/51%. Last CT 2016 and May 2017 and CXR aug 2019 with chronic changes   HPI Molly Ross 79 y.o. - female with bronchiectasis maintained on Brio. I personally have not seen her since May 2018. Since then she has seen my colleagues for flareupsand gotten anabiotic's of prednisone. Most recently in August 2019. Chest x-ray that time showed just chronic changes. Since that time overall she's feeling well. She is stable. She has sputum production every few days. She uses a flutter valve as needed only. Sometimes of sputum is thick. But overall stable no change in color. Does not feel she is in a flareup.She continuesl and the  fluas needed. She is not on Mucinex. She wants to have the flu shot today.he does been a few years since she had pulmonary function test.   She takes care of her sister in a wheelchair. Another sister died from ovarian cancer.   ROS - per HPI   OV 11/19/2018  Subjective:  Patient ID: Molly Ross, female , DOB: 08-28-1941 , age 3 y.o. , MRN: [id number] , ADDRESS: [place] [id number]  11/19/2018 -   Chief Complaint  Patient presents with  . Follow-up    f/u bronchiectasis, still coughing up green mucus,      HPI Molly Ross 79 y.o. -follow bronchiectasis associated with COPD colonized with Haemophilus influenza as determined by bronchoscopy in June 2018.  Maintained on Breo and flutter valve  Last seen mid September 2019.  Since then she has had 2 exacerbations treated over the phone.  The first 1 before Halloween 2019 and the second 1 before Thanksgiving 2019.  She states that with the second 1 just before Thanksgiving 2019 she really has not recovered.  She says she feels extremely fatigued and miserable.  In fact her cough is significantly worse than baseline.  She is bringing up copious amount of green sputum daily.  She is also having chest tightness and wheezing.  Also short of breath.  There is no hemoptysis or fever or chills or orthopnea proximal nocturnal dyspnea.  Overall she feels miserable. Continues with breo and flutter valve COPD CAT score  She is not on saline nebulizer treatment or vibratory vest or Roflumilast or nebulizer or inhaled anticholinergic.   Patient has has daily productive cough lasting greater than 24 months requiring antibiotic therapy and frequent exacerbations.  Flutter valve has been tried, but has not effectively mobilized secretions.  Considered and ruled out manual CPT as no consistent skilled caregiver available to perform therapy.  CT scan performed on 04/15/2016 confirms bronchiectasis.  Meets indication for vest  therapy   OV 12/29/2018  Subjective:  Patient ID: Molly Ross, female , DOB: 07-19-1941 , age 50 y.o. , MRN: [id number] , ADDRESS: [place] [id number]   12/29/2018 -   Chief Complaint  Patient presents with  . Results   follow bronchiectasis associated with COPD colonized with Haemophilus influenza as determined by bronchoscopy in June 2018 , pseudomonas on sputum Dec 2019   Maintained on Breo and flutter valve and in December 2019 added 3% saline nebulizer and Spiriva   HPI TEKELA PARRILLI 79 y.o. -presents for follow-up after her November 16, 2018 visit for recurrent COPD/bronchiectasis exacerbation.  Sputum culture grew Pseudomonas and will change antibiotics to ciprofloxacin.  We introduce Spiriva, 3% saline nebulizer and also vibratory vest.  Of these measures she did the ciprofloxacin Spiriva and 3% saline nebulizer.  With this along with the Brio and flutter valve her symptoms are significantly improved.  She hardly brings out any sputum and it is clear.  She does not want to do the vibratory vest because she is worried about chest muscle soreness after her breast cancer treatment and also the $1900 co-pay which apparently is one time.  She is not doing any Mucinex.  She is not interested in cyclical suppressive antibiotic therapy at this stage.  She did have a CT scan of the chest and this the bronchiectasis is evident, and compared to 2017 8 mm left lower lobe nodule is unchanged some of the volume loss is unchanged and stable.  There is a report of coronary artery calcification which is a new finding.  She does not have any chest pain.  Her previous cardiologist is Dr. Tollie Eth and he did a stress test according to him many years ago.  She wants to see him again.  She is also worried about hiatal hernia finding in the CT scan.  The report is very small hiatal hernia.  But she does admit to significant acid reflux for which  she is not taking any treatment.   She is agreed to take Prilosec and will follow-up with primary care physician.   OV 01/17/2020  Subjective:  Patient ID: Molly Ross, female , DOB: 05/07/1941 , age 74 y.o. , MRN: [id number] , ADDRESS: [place] [id number]   01/17/2020 -   Chief Complaint  Patient presents with  . Follow-up    yearly follow up - breathing is at baseline, no new complaints   follow bronchiectasis [idiopathic work-up February 2017 with negative serology and negative immunoglobulin] 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  Not taking 3% saline nebulizer  Normal stress Myoview in August 2016  Background history of breast cancer followed by Dr. Lindi Adie   HPI Molly Ross 79 y.o. -returns for follow-up.  Last seen in January 2020.  After that because of the pandemic 2019 with Covid she has not returned for follow-up.  She says she has been followed from her job.  Without the external exposures in the environment she has been social distancing and isolating.  Therefore because of this her respiratory symptoms are better.  Her sputum production has improved a lot.  She certainly had 1 exacerbation.  Sputum volume is less cough is much less.  She is only on [place].  She decided not to do the 3% saline nebulizer because she feels she does not need it.  She barely brings out any sputum.  She has had a first dose COVID-19 vaccine Moderna.  She will have the second dose soon    Last pulmonary function test November 2016 with FEV1 postbronchodilator 1.36/51% and DLCO 16/51  Last CT scan of the chest January 2020  CAT COPD Symptom & Quality of Life Score ([place] trademark) 0 is no burden. 5 is highest burden 11/19/2018  12/29/2018   Never Cough -> Cough all the time 5 4  No phlegm in chest -> Chest is full of phlegm 4 4    No chest tightness -> Chest feels very tight 3 1  No dyspnea for 1 flight stairs/hill -> Very dyspneic for 1 flight of stairs 5 4  No limitations for ADL at home -> Very limited with ADL at home 2 3  Confident leaving home -> Not at all confident leaving home 0 1  Sleep soundly -> Do not sleep soundly because of lung condition 0 0  Lots of Energy -> No energy at all 5 3  TOTAL Score (max 40)  24 20     ROS - per HPI     has a past medical history of Atherosclerosis of aorta ([place]) (02/02/2015), Bronchiectasis ([place]), Bronchiectasis with (acute) exacerbation ([place]) (06/09/2015), CAD (coronary artery disease), native coronary artery, Cancer ([place]), Cellulitis of left breast (10/01/2018), Colon polyp, COPD, moderate ([place]) (11/06/2015), Depressive disorder, not elsewhere classified (12/04/2011), Dyspnea, Emphysema of lung ([place]) (02/02/2015), Family history of lung cancer, Family history of ovarian cancer, Family history of prostate cancer, Genetic testing (03/27/2018), GERD (gastroesophageal reflux disease), Hiatal hernia (02/02/2015), History of radiation therapy (04/16/18- 05/14/18), History of smoking 25-50 pack years (06/09/2015), Malignant neoplasm of lower-inner quadrant of left breast in female, estrogen receptor positive ([place]) (03/06/2018), Osteopenia (02/18/2018), Pleuritic pain (10/01/2018), Shingles (1/09, 07/2010, 11/2017), Unspecified vitamin D deficiency (02/2011 and 2016), and Vitamin D deficiency (02/14/2011).   reports that she quit  smoking about 39 years ago. Her smoking use included cigarettes. She has a 48.00 pack-year smoking history. She has never used smokeless tobacco.  Past Surgical History:  Procedure Laterality Date  . BREAST LUMPECTOMY WITH RADIOACTIVE SEED AND SENTINEL LYMPH NODE BIOPSY Left 03/19/2018   Procedure: BREAST LUMPECTOMY WITH RADIOACTIVE SEED AND SENTINEL LYMPH NODE BIOPSY;  Surgeon: Stark Klein, MD;  Location: [facility];  Service: General;  Laterality: Left;   . CATARACT EXTRACTION, BILATERAL Bilateral 03/2017, 04/2017   [facility], Dr. Alanda Slim  . SHOULDER SURGERY  03/2011   for frozen shoulder, and cyst removal (Dr. Percell Miller)  . TONSILLECTOMY  child  . VIDEO BRONCHOSCOPY Bilateral 06/21/2016   Procedure: VIDEO BRONCHOSCOPY WITHOUT FLUORO;  Surgeon: Brand Males, MD;  Location: [facility];  Service: Cardiopulmonary;  Laterality: Bilateral;    No Known Allergies  Immunization History  Administered Date(s) Administered  . Fluad Quad(high Dose 65+) 08/25/2019  . Influenza, High Dose Seasonal PF 01/26/2014, 10/19/2014, 08/30/2015, 08/08/2016, 08/05/2017, 09/02/2018  . Moderna SARS-COVID-2 Vaccination 12/29/2019  . Pneumococcal Conjugate-13 12/25/2015  . Pneumococcal Polysaccharide-23 12/30/2016  . Tdap 09/03/2017    Family History  Problem Relation Age of Onset  . Stroke Mother   . Diabetes Mother   . Stroke Sister 84       brainstem stroke  . Diabetes Sister   . Seizures Sister   . Lung cancer Father 94  . Ovarian cancer Sister 1       metastatic to lung, liver  . Cancer Sister   . Ovarian cancer Maternal Aunt 95  . Heart disease Maternal Grandmother   . Heart disease Paternal Grandmother   . Prostate cancer Cousin   . Lung cancer Cousin        recurrent, now stage 4  . Breast cancer Neg Hx      Current Outpatient Medications:  .  Ascorbic Acid (VITAMIN C) 100 MG tablet, Take 1 tablet (100 mg total) by mouth daily., Disp: , Rfl:  .  aspirin EC 81 MG tablet, Take 81 mg by mouth daily., Disp: , Rfl:  .  atorvastatin (LIPITOR) 20 MG tablet, TAKE 1 TABLET BY MOUTH EVERY DAY, Disp: 90 tablet, Rfl: 1 .  BREO ELLIPTA 100-25 MCG/INH AEPB, TAKE 1 PUFF BY MOUTH EVERY DAY, Disp: 60 each, Rfl: 3 .  Calcium-Magnesium-Vitamin D (CALCIUM 500 PO), Take 1 tablet by mouth daily., Disp: , Rfl:  .  ipratropium-albuterol (DUONEB) 0.5-2.5 (3) MG/3ML SOLN, Take 3 mLs by nebulization every 4 (four) hours as needed., Disp: 360 mL, Rfl: 2 .   letrozole (FEMARA) 2.5 MG tablet, Take 1 tablet (2.5 mg total) by mouth daily., Disp: 90 tablet, Rfl: 3 .  Multiple Vitamins-Minerals (MULTIVITAMIN WITH MINERALS) tablet, Take 1 tablet by mouth daily., Disp: , Rfl:  .  omeprazole (PRILOSEC OTC) 20 MG tablet, Take 20 mg by mouth daily., Disp: , Rfl:  .  polyethylene glycol (MIRALAX / GLYCOLAX) 17 g packet, Take 17 g by mouth daily., Disp: , Rfl:  .  sodium chloride HYPERTONIC 3 % nebulizer solution, Take by nebulization as needed for other., Disp: 750 mL, Rfl: 2 .  SPIRIVA RESPIMAT 2.5 MCG/ACT AERS, TAKE 2 PUFFS BY MOUTH TWICE A DAY, Disp: 18 g, Rfl: 3      Objective:   Vitals:   01/17/20 1130  BP: 126/68  Pulse: 77  Temp: (!) 97 F (36.1 C)  TempSrc: Temporal  SpO2: 99%  Weight: 146 lb 3.2 oz (66.3 kg)  Height:  5\' 9"  (1.753 m)    Estimated body mass index is 21.59 kg/m as calculated from the following:   Height as of this encounter: 5\' 9"  (1.753 m).   Weight as of this encounter: 146 lb 3.2 oz (66.3 kg).  @WEIGHTCHANGE @  [employer]   01/17/20 1130  Weight: 146 lb 3.2 oz (66.3 kg)     Physical Exam  General Appearance:    Alert, cooperative, no distress, appears stated age - yes , Deconditioned looking - no , OBESE  - no, Sitting on Wheelchair -  no  Head:    Normocephalic, without obvious abnormality, atraumatic  Eyes:    PERRL, conjunctiva/corneas clear,  Ears:    Normal TM's and external ear canals, both ears  Nose:   Nares normal, septum midline, mucosa normal, no drainage    or sinus tenderness. OXYGEN ON  - no . Patient is @ ra   Throat:   Lips, mucosa, and tongue normal; teeth and gums normal. Cyanosis on lips - no  Neck:   Supple, symmetrical, trachea midline, no adenopathy;    thyroid:  no enlargement/tenderness/nodules; no carotid   bruit or JVD  Back:     Symmetric, no curvature, ROM normal, no CVA tenderness  Lungs:     Distress - no , Wheeze no, Barrell Chest - no, Purse lip breathing - no, Crackles - no    Chest Wall:    No tenderness or deformity.    Heart:    Regular rate and rhythm, S1 and S2 normal, no rub   or gallop, Murmur - no  Breast Exam:    NOT DONE  Abdomen:     Soft, non-tender, bowel sounds active all four quadrants,    no masses, no organomegaly. Visceral obesity - no  Genitalia:   NOT DONE  Rectal:   NOT DONE  Extremities:   Extremities - normal, Has Cane - no, Clubbing - no, Edema - no  Pulses:   2+ and symmetric all extremities  Skin:   Stigmata of Connective Tissue Disease - no  Lymph nodes:   Cervical, supraclavicular, and axillary nodes normal  Psychiatric:  Neurologic:   Pleasant - yes, Anxious - no, Flat affect - no  CAm-ICU - neg, Alert and Oriented x 3 - yes, Moves all 4s - yes, Speech - normal, Cognition - intact           Assessment:       ICD-10-CM   1. Bronchiectasis without complication ([place])  A999333   2. COPD, moderate ([place])  J44.9   3. Hiatal hernia with GERD  K21.9    K44.9   4. Nodule of lower lobe of left lung  R91.1        Plan:     Patient Instructions  Bronchiectasis without complication ([place])  -Improved especially without exposure to external triggers because of the COVID-19 pandemic and social distancing  Plan -Continue Spiriva and Breo -Hold off on 3% saline nebulizer because it seems you are not needing it and the symptoms are better -Continue flutter valve 2-4 times daily -Over-the-counter Mucinex DM twice daily as needed -Refer Dr. Ernest Mallick in our practice for bronchiectasis   -She has special interest in bronchiectasis.  -You can see in 6 months from now.  If you like her you  can stay with her otherwise do a few visits with her and then return back to see me based on her second opinion -Do spirometry pre and postbronchodilator with DLCO  in 6 months  -Do this before you see Dr. Carlis Abbott  -Hold off on vibratory vest but if repeated exacerbations are high sputum volume continue then we will start this -In the future  Roflumilast to prevent frequent flareups as an option along with suppressive cyclical antibiotic therapy    Hiatal hernia with GERD  -The hiatal hernia is very tiny but you have significant acid reflux which might also be contributing to worsening respiratory symptoms  -Glad this is better after starting Prilosec 1 year ago  plan -Continue over-the-counter Prilosec 20 mg daily on empty stomach -Follow this issue with primary care physician  8 mm left lower lobe lung nodule -Stable 2017 through 2020 -No further follow-up  Follow-up 6 months do spirometry pre and postbronchodilator with DLCO [last one was in 2016] 6 months with Dr. Carlis Abbott in our practice for bronchiectasis   -Your last CT scan of the chest was in January 2020 therefore she can decide if you need another scan or chest x-ray when you see her at follow-up.       SIGNATURE    Dr. Brand Males, M.D., F.C.C.P,  Pulmonary and Critical Care Medicine Staff Physician, [place] Director - [facility]  Program  [facility] [place], [place], [address]  Pager: [phone], If no answer or between  15:00h - 7:00h: call [phone] Telephone: [phone]  11:49 AM 01/17/2020

## 2020-01-17 NOTE — Patient Instructions (Addendum)
Bronchiectasis without complication ([place])  -Improved especially without exposure to external triggers because of the COVID-19 pandemic and social distancing  Plan -Continue Spiriva and Breo -Hold off on 3% saline nebulizer because it seems you are not needing it and the symptoms are better -Continue flutter valve 2-4 times daily -Over-the-counter Mucinex DM twice daily as needed -Refer Dr. Ernest Mallick in our practice for bronchiectasis   -She has special interest in bronchiectasis.  -You can see in 6 months from now.  If you like her you  can stay with her otherwise do a few visits with her and then return back to see me based on her second opinion -Do spirometry pre and postbronchodilator with DLCO in 6 months  -Do this before you see Dr. Carlis Abbott  -Hold off on vibratory vest but if repeated exacerbations are high sputum volume continue then we will start this -In the future Roflumilast to prevent frequent flareups as an option along with suppressive cyclical antibiotic therapy    Hiatal hernia with GERD  -The hiatal hernia is very tiny but you have significant acid reflux which might also be contributing to worsening respiratory symptoms  -Glad this is better after starting Prilosec 1 year ago  plan -Continue over-the-counter Prilosec 20 mg daily on empty stomach -Follow this issue with primary care physician  8 mm left lower lobe lung nodule -Stable 2017 through 2020 -No further follow-up  Follow-up 6 months do spirometry pre and postbronchodilator with DLCO [last one was in 2016] 6 months with Dr. Carlis Abbott in our practice for bronchiectasis   -Your last CT scan of the chest was in January 2020 therefore she can decide if you need another scan or chest x-ray when you see her at follow-up.

## 2020-02-04 ENCOUNTER — Other Ambulatory Visit: Payer: Self-pay | Admitting: Adult Health

## 2020-02-04 DIAGNOSIS — Z9889 Other specified postprocedural states: Secondary | ICD-10-CM

## 2020-02-22 ENCOUNTER — Other Ambulatory Visit: Payer: Self-pay | Admitting: Internal Medicine

## 2020-02-29 ENCOUNTER — Other Ambulatory Visit: Payer: Self-pay | Admitting: Hematology and Oncology

## 2020-03-01 ENCOUNTER — Ambulatory Visit (INDEPENDENT_AMBULATORY_CARE_PROVIDER_SITE_OTHER): Payer: Medicare Other

## 2020-03-01 ENCOUNTER — Other Ambulatory Visit: Payer: Self-pay

## 2020-03-01 DIAGNOSIS — Z780 Asymptomatic menopausal state: Secondary | ICD-10-CM | POA: Diagnosis not present

## 2020-03-01 DIAGNOSIS — M85851 Other specified disorders of bone density and structure, right thigh: Secondary | ICD-10-CM | POA: Diagnosis not present

## 2020-03-01 DIAGNOSIS — M8589 Other specified disorders of bone density and structure, multiple sites: Secondary | ICD-10-CM | POA: Diagnosis not present

## 2020-03-02 ENCOUNTER — Ambulatory Visit (INDEPENDENT_AMBULATORY_CARE_PROVIDER_SITE_OTHER): Payer: Medicare Other | Admitting: Family Medicine

## 2020-03-02 ENCOUNTER — Encounter: Payer: Self-pay | Admitting: Family Medicine

## 2020-03-02 VITALS — Ht 68.0 in | Wt 145.0 lb

## 2020-03-02 DIAGNOSIS — M85851 Other specified disorders of bone density and structure, right thigh: Secondary | ICD-10-CM | POA: Diagnosis not present

## 2020-03-02 DIAGNOSIS — Z9189 Other specified personal risk factors, not elsewhere classified: Secondary | ICD-10-CM | POA: Diagnosis not present

## 2020-03-02 MED ORDER — ALENDRONATE SODIUM 70 MG PO TABS: 70.0000 mg | ORAL_TABLET | ORAL | 11 refills | Status: DC

## 2020-03-02 NOTE — Progress Notes (Signed)
Start time: 10:03 End time: 10:26  Virtual Visit via Video Note  I connected with Molly Ross on 03/02/20 at 10:00 AM EDT by a video enabled telemedicine application and verified that I am speaking with the correct person using two identifiers.  Location: Patient: home Provider: office   I discussed the limitations of evaluation and management by telemedicine and the availability of in person appointments. The patient expressed understanding and agreed to proceed.  History of Present Illness: Chief Complaint  Patient presents with  . Follow-up    discuss DEXA results.    Patient had osteopenia noted on prior DEXA 2 years ago, with T-1.8 at R fem neck, and FRAX score of 3.1% for hip fracture. She had repeat DEXA done yesterday.  T-2.1 at the R fem neck, with FRAX score of 4.6% for hip fracture.  She denies dysphagia, only intermittent reflux symptoms. She has h/o breast cancer, and is taking letrozole daily.  PMH, PSH, SH reviewed  Current Outpatient Medications on File Prior to Visit  Medication Sig Dispense Refill  . Ascorbic Acid (VITAMIN C) 100 MG tablet Take 1 tablet (100 mg total) by mouth daily.    Marland Kitchen aspirin EC 81 MG tablet Take 81 mg by mouth daily.    Marland Kitchen atorvastatin (LIPITOR) 20 MG tablet TAKE 1 TABLET BY MOUTH EVERY DAY 90 tablet 1  . BREO ELLIPTA 100-25 MCG/INH AEPB INHALE 1 PUFF BY MOUTH EVERY DAY 60 each 3  . Calcium-Magnesium-Vitamin D (CALCIUM 500 PO) Take 1 tablet by mouth daily.    Marland Kitchen letrozole (FEMARA) 2.5 MG tablet Take 1 tablet (2.5 mg total) by mouth daily. 90 tablet 3  . Multiple Vitamins-Minerals (MULTIVITAMIN WITH MINERALS) tablet Take 1 tablet by mouth daily.    Marland Kitchen omeprazole (PRILOSEC OTC) 20 MG tablet Take 20 mg by mouth daily.    Marland Kitchen SPIRIVA RESPIMAT 2.5 MCG/ACT AERS TAKE 2 PUFFS BY MOUTH TWICE A DAY 18 g 3  . ipratropium-albuterol (DUONEB) 0.5-2.5 (3) MG/3ML SOLN Take 3 mLs by nebulization every 4 (four) hours as needed. (Patient not taking: Reported  on 03/02/2020) 360 mL 2  . sodium chloride HYPERTONIC 3 % nebulizer solution Take by nebulization as needed for other. (Patient not taking: Reported on 03/02/2020) 750 mL 2   No current facility-administered medications on file prior to visit.   No Known Allergies  ROS: no fever, chills, URI symptoms, chest pain, dysphagia, heartburn (infrequent), jaw or hip pain.  Denies GI complaints or other concerns.   Observations/Objective:  Ht 5\' 8"  (1.727 m)   Wt 145 lb (65.8 kg)   BMI 22.05 kg/m   Alert, oriented, pleasant female in good spirits. Cranial nerves are grossly intact. Normal mood, affect, grooming  DEXA results reviewed in detail with patient.   Assessment and Plan:  Counseled re: osteopenia, recent decline and worsening FRAX score indicating higher risk for hip fracture. Discussed the need for Ca, D and weight-bearing exercise. Discussed bisphosphonates (all oral forms, and IV), as well as Prolia, if she doesn't tolerate the bisphosphonates. Reviewed risks/SE in detail. She will start weekly alendronate, and reviewed proper way to take in detail. She will contact us if she has any problems with the medications. Repeat DEXA in 2 years.  Sent DEXA to Dr. Lindi Adie  Follow Up Instructions:    I discussed the assessment and treatment plan with the patient. The patient was provided an opportunity to ask questions and all were answered. The patient agreed with the plan and demonstrated  an understanding of the instructions.   The patient was advised to call back or seek an in-person evaluation if the symptoms worsen or if the condition fails to improve as anticipated.  I provided 23 minutes of video face-to-face time during this encounter. Additional time spent in chart review and documentation  Vikki Ports, MD

## 2020-03-02 NOTE — Patient Instructions (Signed)
Osteopenia  Osteopenia is a loss of thickness (density) inside of the bones. Another name for osteopenia is low bone mass. Mild osteopenia is a normal part of aging. It is not a disease, and it does not cause symptoms. However, if you have osteopenia and continue to lose bone mass, you could develop a condition that causes the bones to become thin and break more easily (osteoporosis). You may also lose some height, have back pain, and have a stooped posture. Although osteopenia is not a disease, making changes to your lifestyle and diet can help to prevent osteopenia from developing into osteoporosis. What are the causes? Osteopenia is caused by loss of calcium in the bones.  Bones are constantly changing. Old bone cells are continually being replaced with new bone cells. This process builds new bone. The mineral calcium is needed to build new bone and maintain bone density. Bone density is usually highest around age 35. After that, most people's bodies cannot replace all the bone they have lost with new bone. What increases the risk? You are more likely to develop this condition if:  You are older than age 50.  You are a woman who went through menopause early.  You have a long illness that keeps you in bed.  You do not get enough exercise.  You lack certain nutrients (malnutrition).  You have an overactive thyroid gland (hyperthyroidism).  You smoke.  You drink a lot of alcohol.  You are taking medicines that weaken the bones, such as steroids. What are the signs or symptoms? This condition does not cause any symptoms. You may have a slightly higher risk for bone breaks (fractures), so getting fractures more easily than normal may be an indication of osteopenia. How is this diagnosed? Your health care provider can diagnose this condition with a special type of X-ray exam that measures bone density (dual-energy X-ray absorptiometry, DEXA). This test can measure bone density in your  hips, spine, and wrists. Osteopenia has no symptoms, so this condition is usually diagnosed after a routine bone density screening test is done for osteoporosis. This routine screening is usually done for:  Women who are age 65 or older.  Men who are age 70 or older. If you have risk factors for osteopenia, you may have the screening test at an earlier age. How is this treated? Making dietary and lifestyle changes can lower your risk for osteoporosis. If you have severe osteopenia that is close to becoming osteoporosis, your health care provider may prescribe medicines and dietary supplements such as calcium and vitamin D. These supplements help to rebuild bone density. Follow these instructions at home:   Take over-the-counter and prescription medicines only as told by your health care provider. These include vitamins and supplements.  Eat a diet that is high in calcium and vitamin D. ? Calcium is found in dairy products, beans, salmon, and leafy green vegetables like spinach and broccoli. ? Look for foods that have vitamin D and calcium added to them (fortified foods), such as orange juice, cereal, and bread.  Do 30 or more minutes of a weight-bearing exercise every day, such as walking, jogging, or playing a sport. These types of exercises strengthen the bones.  Take precautions at home to lower your risk of falling, such as: ? Keeping rooms well-lit and free of clutter, such as cords. ? Installing safety rails on stairs. ? Using rubber mats in the bathroom or other areas that are often wet or slippery.  Do not use   any products that contain nicotine or tobacco, such as cigarettes and e-cigarettes. If you need help quitting, ask your health care provider.  Avoid alcohol or limit alcohol intake to no more than 1 drink a day for nonpregnant women and 2 drinks a day for men. One drink equals 12 oz of beer, 5 oz of wine, or 1 oz of hard liquor.  Keep all follow-up visits as told by your  health care provider. This is important. Contact a health care provider if:  You have not had a bone density screening for osteoporosis and you are: ? A woman, age 22 or older. ? A man, age 39 or older.  You are a postmenopausal woman who has not had a bone density screening for osteoporosis.  You are older than age 43 and you want to know if you should have bone density screening for osteoporosis. Summary  Osteopenia is a loss of thickness (density) inside of the bones. Another name for osteopenia is low bone mass.  Osteopenia is not a disease, but it may increase your risk for a condition that causes the bones to become thin and break more easily (osteoporosis).  You may be at risk for osteopenia if you are older than age 75 or if you are a woman who went through early menopause.  Osteopenia does not cause any symptoms, but it can be diagnosed with a bone density screening test.  Dietary and lifestyle changes are the first treatment for osteopenia. These may lower your risk for osteoporosis. This information is not intended to replace advice given to you by your health care provider. Make sure you discuss any questions you have with your health care provider. Document Revised: 11/14/2017 Document Reviewed: 09/10/2017 Elsevier Patient Education  [place].  Alendronate weekly tablets What is this medicine? ALENDRONATE (a LEN droe nate) slows calcium loss from bones. It helps to make healthy bone and to slow bone loss in people with osteoporosis. It may be used to treat Paget's disease. This medicine may be used for other purposes; ask your health care provider or pharmacist if you have questions. COMMON BRAND NAME(S): Fosamax What should I tell my health care provider before I take this medicine? They need to know if you have any of these conditions:  esophagus, stomach, or intestine problems, like acid-reflux or GERD  dental disease  kidney disease  low blood  calcium  low vitamin D  problems swallowing  problems sitting or standing for 30 minutes  an unusual or allergic reaction to alendronate, other medicines, foods, dyes, or preservatives  pregnant or trying to get pregnant  breast-feeding How should I use this medicine? You must take this medicine exactly as directed or you will lower the amount of medicine you absorb into your body or you may cause yourself harm. Take your dose by mouth first thing in the morning, after you are up for the day. Do not eat or drink anything before you take this medicine. Swallow your medicine with a full glass (6 to 8 fluid ounces) of plain water. Do not take this tablet with any other drink. Do not chew or crush the tablet. After taking this medicine, do not eat breakfast, drink, or take any medicines or vitamins for at least 30 minutes. Stand or sit up for at least 30 minutes after you take this medicine; do not lie down. Take this medicine on the same day every week. Do not take your medicine more often than directed. Talk to  your pediatrician regarding the use of this medicine in children. Special care may be needed. Overdosage: If you think you have taken too much of this medicine contact a poison control center or emergency room at once. NOTE: This medicine is only for you. Do not share this medicine with others. What if I miss a dose? If you miss a dose, take the dose on the morning after you remember. Then take your next dose on your regular day of the week. Never take 2 tablets on the same day. Do not take double or extra doses. What may interact with this medicine?  aluminum hydroxide  antacids  aspirin  calcium supplements  drugs for inflammation like ibuprofen, naproxen, and others  iron supplements  magnesium supplements  vitamins with minerals This list may not describe all possible interactions. Give your health care provider a list of all the medicines, herbs, non-prescription drugs,  or dietary supplements you use. Also tell them if you smoke, drink alcohol, or use illegal drugs. Some items may interact with your medicine. What should I watch for while using this medicine? Visit your doctor or health care professional for regular checks ups. It may be some time before you see benefit from this medicine. Do not stop taking your medication except on your doctor's advice. Your doctor or health care professional may order blood tests and other tests to see how you are doing. You should make sure you get enough calcium and vitamin D while you are taking this medicine, unless your doctor tells you not to. Discuss the foods you eat and the vitamins you take with your health care professional. Some people who take this medicine have severe bone, joint, and/or muscle pain. This medicine may also increase your risk for a broken thigh bone. Tell your doctor right away if you have pain in your upper leg or groin. Tell your doctor if you have any pain that does not go away or that gets worse. This medicine can make you more sensitive to the sun. If you get a rash while taking this medicine, sunlight may cause the rash to get worse. Keep out of the sun. If you cannot avoid being in the sun, wear protective clothing and use sunscreen. Do not use sun lamps or tanning beds/booths. What side effects may I notice from receiving this medicine? Side effects that you should report to your doctor or health care professional as soon as possible:  allergic reactions like skin rash, itching or hives, swelling of the face, lips, or tongue  black or tarry stools  bone, muscle or joint pain  changes in vision  chest pain  heartburn or stomach pain  jaw pain, especially after dental work  pain or trouble when swallowing  redness, blistering, peeling or loosening of the skin, including inside the mouth Side effects that usually do not require medical attention (report to your doctor or health care  professional if they continue or are bothersome):  changes in taste  diarrhea or constipation  eye pain or itching  headache  nausea or vomiting  stomach gas or fullness This list may not describe all possible side effects. Call your doctor for medical advice about side effects. You may report side effects to FDA at 1-800-FDA-1088. Where should I keep my medicine? Keep out of the reach of children. Store at room temperature of 15 and 30 degrees C (59 and 86 degrees F). Throw away any unused medicine after the expiration date. NOTE: This sheet is  a summary. It may not cover all possible information. If you have questions about this medicine, talk to your doctor, pharmacist, or health care provider.  2020 Elsevier/Gold Standard (2011-10-17 09:02:42)

## 2020-03-06 ENCOUNTER — Other Ambulatory Visit: Payer: Self-pay | Admitting: *Deleted

## 2020-03-06 MED ORDER — BREO ELLIPTA 100-25 MCG/INH IN AEPB: INHALATION_SPRAY | RESPIRATORY_TRACT | 0 refills | Status: DC

## 2020-03-13 ENCOUNTER — Other Ambulatory Visit: Payer: Self-pay | Admitting: Family Medicine

## 2020-03-13 ENCOUNTER — Other Ambulatory Visit: Payer: Self-pay | Admitting: Hematology and Oncology

## 2020-03-13 DIAGNOSIS — M545 Low back pain, unspecified: Secondary | ICD-10-CM

## 2020-03-15 ENCOUNTER — Ambulatory Visit
Admission: RE | Admit: 2020-03-15 | Discharge: 2020-03-15 | Disposition: A | Discharge status: Home / Self Care | Payer: Medicare Other | Source: Ambulatory Visit | Attending: Adult Health | Admitting: Adult Health

## 2020-03-15 ENCOUNTER — Other Ambulatory Visit: Payer: Self-pay

## 2020-03-15 DIAGNOSIS — R922 Inconclusive mammogram: Secondary | ICD-10-CM | POA: Diagnosis not present

## 2020-03-15 DIAGNOSIS — Z9889 Other specified postprocedural states: Secondary | ICD-10-CM

## 2020-03-15 DIAGNOSIS — Z853 Personal history of malignant neoplasm of breast: Secondary | ICD-10-CM | POA: Diagnosis not present

## 2020-03-31 ENCOUNTER — Encounter: Payer: Self-pay | Admitting: Family Medicine

## 2020-04-14 ENCOUNTER — Encounter: Payer: Self-pay | Admitting: Family Medicine

## 2020-04-14 ENCOUNTER — Other Ambulatory Visit: Payer: Self-pay

## 2020-04-14 ENCOUNTER — Ambulatory Visit (INDEPENDENT_AMBULATORY_CARE_PROVIDER_SITE_OTHER): Payer: Medicare Other | Admitting: Family Medicine

## 2020-04-14 VITALS — BP 118/70 | HR 77 | Temp 97.3°F | Wt 149.6 lb

## 2020-04-14 DIAGNOSIS — B029 Zoster without complications: Secondary | ICD-10-CM | POA: Diagnosis not present

## 2020-04-14 MED ORDER — VALACYCLOVIR HCL 1 G PO TABS: 1000.0000 mg | ORAL_TABLET | Freq: Three times a day (TID) | ORAL | 0 refills | Status: DC

## 2020-04-14 NOTE — Progress Notes (Signed)
   Subjective:    Patient ID: Molly Ross, female    DOB: 1941/09/14, 79 y.o.   MRN: [id number]  HPI She complains of a 1 day history of burning, tingling and rash in the left gluteal area.  She notes that this is similar to a previous episode of shingles in a different dermatome pattern.   Review of Systems     Objective:   Physical Exam Alert and in no distress.  Exam of the gluteal area does show a vesicular lesion approximately 2 cm in size with surrounding slight erythema.  It is in the S3 nerve root pattern on the left       Assessment & Plan:  Herpes zoster without complication - Plan: valACYclovir (VALTREX) 1000 MG tablet She is familiar with this and I recommend using 800 mg ibuprofen 3 times per day for the pain and call if she needs more medication.  She was comfortable with that.

## 2020-04-14 NOTE — Patient Instructions (Signed)
Take 800 mg of ibuprofen 3 times per day as needed for the pain.  And finish the Valtrex dosing

## 2020-04-18 ENCOUNTER — Other Ambulatory Visit: Payer: Self-pay | Admitting: Internal Medicine

## 2020-05-26 ENCOUNTER — Other Ambulatory Visit (HOSPITAL_COMMUNITY): Payer: Medicare Other

## 2020-06-04 ENCOUNTER — Other Ambulatory Visit: Payer: Self-pay | Admitting: Hematology and Oncology

## 2020-06-07 ENCOUNTER — Other Ambulatory Visit: Payer: Self-pay | Admitting: Family Medicine

## 2020-06-07 DIAGNOSIS — I700 Atherosclerosis of aorta: Secondary | ICD-10-CM

## 2020-06-07 NOTE — Telephone Encounter (Signed)
Has an appt in september 

## 2020-07-24 ENCOUNTER — Other Ambulatory Visit: Payer: Self-pay | Admitting: Pulmonary Disease

## 2020-07-24 ENCOUNTER — Telehealth: Payer: Self-pay | Admitting: Internal Medicine

## 2020-07-24 ENCOUNTER — Encounter: Payer: Self-pay | Admitting: Internal Medicine

## 2020-07-24 ENCOUNTER — Other Ambulatory Visit: Payer: Self-pay

## 2020-07-24 ENCOUNTER — Ambulatory Visit (INDEPENDENT_AMBULATORY_CARE_PROVIDER_SITE_OTHER)
Admission: RE | Admit: 2020-07-24 | Discharge: 2020-07-24 | Disposition: A | Discharge status: Home / Self Care | Payer: Medicare Other | Source: Ambulatory Visit | Attending: Pulmonary Disease | Admitting: Pulmonary Disease

## 2020-07-24 DIAGNOSIS — R059 Cough, unspecified: Secondary | ICD-10-CM

## 2020-07-24 DIAGNOSIS — R05 Cough: Secondary | ICD-10-CM

## 2020-07-24 DIAGNOSIS — J471 Bronchiectasis with (acute) exacerbation: Secondary | ICD-10-CM

## 2020-07-24 MED ORDER — DOXYCYCLINE HYCLATE 100 MG PO TABS
100.0000 mg | ORAL_TABLET | Freq: Two times a day (BID) | ORAL | 0 refills | Status: DC
Start: 2020-07-24 — End: 2020-09-07

## 2020-07-24 NOTE — Telephone Encounter (Signed)
Spoke with patient, she stated she had not been using the hypertonic saline, but had been using her flutter valve.  Advised to continue to use both as previously instructed.  She did received her 2nd Moderna vaccine on 01/26/2020, will update record.  Patient instructed to go to [facility] for stat CXR and we will f/u with her after we have the results.  She verbalized understanding.

## 2020-07-24 NOTE — Telephone Encounter (Signed)
Called and spoke with patient she states that Friday night around 7pm patient started feeling chilled and could not get warm with temp of 99.7 and took Tylenol. She checked her temp yesterday and it was 100.6 and today her temp is 98.5 and is feeling a little better and now has productive cough with yellow/green sputum.   Aaron Edelman please advise    Patient has terrible headcold, coughing yellow/green stuff and has Bronchiectasis. Trying to get ahold of the flare up before it becomes full blown. Would like a prescription. Please advise [phone]

## 2020-07-24 NOTE — Telephone Encounter (Signed)
07/24/2020  Please confirm with the patient that she is still using her hypertonic saline as well as a flutter valve.  It does appear the patient has received her Covid vaccines see that documented below:  Immunization History  Administered Date(s) Administered  . Fluad Quad(high Dose 65+) 08/25/2019  . Influenza, High Dose Seasonal PF 01/26/2014, 10/19/2014, 08/30/2015, 08/08/2016, 08/05/2017, 09/02/2018  . Moderna SARS-COVID-2 Vaccination 12/29/2019  . Pneumococcal Conjugate-13 12/25/2015  . Pneumococcal Polysaccharide-23 12/30/2016  . Tdap 09/03/2017   Please confirm with the patient that she did receive the second Moderna vaccine as it is only showing 1 in documentation.  Would recommend the patient go into the [facility] for an xray if she is fully vaccinated.  If she is not fully vaccinated would recommend that she obtains outpatient Covid testing.  Can go ahead and place the order stat.  Can consider antibiotics after obtaining imaging.  Wyn Quaker, FNP

## 2020-07-27 ENCOUNTER — Telehealth: Payer: Self-pay | Admitting: Pulmonary Disease

## 2020-07-27 NOTE — Telephone Encounter (Signed)
Called and spoke with patient she states that she came in yesterday and provided sputum sample and gave to one of the girls and they put it in the fridge in the lab. Went to check with Irma and she knew nothing about it, but sample was in fridge. Had to throw sample out due to it being to long. Called patient to let her know that we could not use that sample because its been to long and that we would have to recollect it. Apologized to patient that this happened. She expressed understanding and stated she would be here tomorrow morning to pick up cups. Informed patient that there was going to be 2 cups and she needs to put sputum in both cups for the testing that was ordered. She expressed understanding. Cups placed up front. Nothing further needed at this time.

## 2020-07-30 ENCOUNTER — Encounter: Payer: Self-pay | Admitting: Family Medicine

## 2020-07-31 ENCOUNTER — Telehealth: Payer: Self-pay | Admitting: Pulmonary Disease

## 2020-07-31 NOTE — Telephone Encounter (Signed)
Called and spoke with pt who states her sister that she lives with was admitted to the hospital 8/15. Pt just found out that she was diagnosed with Covid. Pt states that she is currently on her way to [facility] in [place] to be tested.  Pt states that she was sick last week and called 8/9 due to symptoms and had a cxr performed. Pt was placed on an abx by Aaron Edelman.  Pt states that her cough is better and denies any complaints of fever. Pt states that she is still on the abx.  Pt states that she came to office Friday 8/13 to pick up specimen cup to do the sputum sample and due to her being exposed to covid and also with her being tested as well.  MR, please advise on this.

## 2020-08-01 ENCOUNTER — Other Ambulatory Visit: Payer: Medicare Other

## 2020-08-01 NOTE — Telephone Encounter (Signed)
Called and spoke to pt. Informed her of the recs per MR. Pt verbalized understanding and denied any further questions or concerns at this time.   

## 2020-08-01 NOTE — Telephone Encounter (Signed)
Because dx is covid or exposure is to covid - hold off giving sputum sample for total 2-3 weeks from diagnosis or exposure

## 2020-08-20 ENCOUNTER — Telehealth: Payer: Self-pay | Admitting: Pulmonary Disease

## 2020-08-20 NOTE — Telephone Encounter (Signed)
Patient called answering service  Pain in the right lung  She thinks her bronchiectasis may be acting up  Not at home to be able to use a nebulizer  Encouraged to use cough medicine-Mucinex  Continue usual inhalers  If significant concern about pain, then she has to go to the emergency room to be physically evaluated  No fever or chills, no symptoms suggesting an infectious exacerbation at present

## 2020-08-21 ENCOUNTER — Telehealth: Payer: Self-pay | Admitting: Adult Health

## 2020-08-21 ENCOUNTER — Emergency Department (HOSPITAL_BASED_OUTPATIENT_CLINIC_OR_DEPARTMENT_OTHER): Payer: Medicare Other

## 2020-08-21 ENCOUNTER — Emergency Department (HOSPITAL_BASED_OUTPATIENT_CLINIC_OR_DEPARTMENT_OTHER)
Admission: EM | Admit: 2020-08-21 | Discharge: 2020-08-21 | Disposition: A | Discharge status: Home / Self Care | Payer: Medicare Other | Attending: Emergency Medicine | Admitting: Emergency Medicine

## 2020-08-21 ENCOUNTER — Other Ambulatory Visit: Payer: Self-pay

## 2020-08-21 ENCOUNTER — Encounter (HOSPITAL_BASED_OUTPATIENT_CLINIC_OR_DEPARTMENT_OTHER): Payer: Self-pay | Admitting: *Deleted

## 2020-08-21 DIAGNOSIS — I2510 Atherosclerotic heart disease of native coronary artery without angina pectoris: Secondary | ICD-10-CM | POA: Insufficient documentation

## 2020-08-21 DIAGNOSIS — Z79899 Other long term (current) drug therapy: Secondary | ICD-10-CM | POA: Diagnosis not present

## 2020-08-21 DIAGNOSIS — Z87891 Personal history of nicotine dependence: Secondary | ICD-10-CM | POA: Diagnosis not present

## 2020-08-21 DIAGNOSIS — Z23 Encounter for immunization: Secondary | ICD-10-CM | POA: Diagnosis not present

## 2020-08-21 DIAGNOSIS — J449 Chronic obstructive pulmonary disease, unspecified: Secondary | ICD-10-CM | POA: Diagnosis not present

## 2020-08-21 DIAGNOSIS — Z85828 Personal history of other malignant neoplasm of skin: Secondary | ICD-10-CM | POA: Diagnosis not present

## 2020-08-21 DIAGNOSIS — Z7982 Long term (current) use of aspirin: Secondary | ICD-10-CM | POA: Insufficient documentation

## 2020-08-21 DIAGNOSIS — J90 Pleural effusion, not elsewhere classified: Secondary | ICD-10-CM | POA: Diagnosis not present

## 2020-08-21 DIAGNOSIS — U071 COVID-19: Secondary | ICD-10-CM | POA: Diagnosis not present

## 2020-08-21 DIAGNOSIS — R05 Cough: Secondary | ICD-10-CM | POA: Diagnosis not present

## 2020-08-21 LAB — BASIC METABOLIC PANEL
Anion gap: 10 (ref 5–15)
BUN: 16 mg/dL (ref 8–23)
CO2: 28 mmol/L (ref 22–32)
Calcium: 9.4 mg/dL (ref 8.9–10.3)
Chloride: 101 mmol/L (ref 98–111)
Creatinine, Ser: 0.42 mg/dL — ABNORMAL LOW (ref 0.44–1.00)
GFR calc Af Amer: >60 mL/min (ref >60)
GFR calc non Af Amer: >60 mL/min (ref >60)
Glucose, Bld: 95 mg/dL (ref 70–99)
Potassium: 4.2 mmol/L (ref 3.5–5.1)
Sodium: 139 mmol/L (ref 135–145)

## 2020-08-21 LAB — CBC WITH DIFFERENTIAL/PLATELET
Abs Immature Granulocytes: 0.03 10*3/uL (ref 0.00–0.07)
Basophils Absolute: 0.1 10*3/uL (ref 0.0–0.1)
Basophils Relative: 1 %
Eosinophils Absolute: 0.1 10*3/uL (ref 0.0–0.5)
Eosinophils Relative: 2 %
HCT: 39.7 % (ref 36.0–46.0)
Hemoglobin: 12.6 g/dL (ref 12.0–15.0)
Immature Granulocytes: 0 %
Lymphocytes Relative: 17 %
Lymphs Abs: 1.3 10*3/uL (ref 0.7–4.0)
MCH: 29.4 pg (ref 26.0–34.0)
MCHC: 31.7 g/dL (ref 30.0–36.0)
MCV: 92.5 fL (ref 80.0–100.0)
Monocytes Absolute: 0.8 10*3/uL (ref 0.1–1.0)
Monocytes Relative: 10 %
Neutro Abs: 5.5 10*3/uL (ref 1.7–7.7)
Neutrophils Relative %: 70 %
Platelets: 280 10*3/uL (ref 150–400)
RBC: 4.29 MIL/uL (ref 3.87–5.11)
RDW: 13.7 % (ref 11.5–15.5)
WBC: 7.9 10*3/uL (ref 4.0–10.5)
nRBC: 0 % (ref 0.0–0.2)

## 2020-08-21 LAB — SARS CORONAVIRUS 2 BY RT PCR (HOSPITAL ORDER, PERFORMED IN ~~LOC~~ HOSPITAL LAB): SARS Coronavirus 2: POSITIVE — AB

## 2020-08-21 MED ORDER — ALBUTEROL SULFATE HFA 108 (90 BASE) MCG/ACT IN AERS: 2.0000 | INHALATION_SPRAY | Freq: Once | RESPIRATORY_TRACT | Status: DC | PRN

## 2020-08-21 MED ORDER — SODIUM CHLORIDE 0.9 % IV SOLN
1200.0000 mg | Freq: Once | INTRAVENOUS | Status: AC
  Administered 2020-08-21: 1200 mg via INTRAVENOUS
  Filled 2020-08-21: qty 10

## 2020-08-21 MED ORDER — SODIUM CHLORIDE 0.9 % IV SOLN
1.0000 g | Freq: Once | INTRAVENOUS | Status: AC
  Administered 2020-08-21: 1 g via INTRAVENOUS
  Filled 2020-08-21: qty 10

## 2020-08-21 MED ORDER — METHYLPREDNISOLONE SODIUM SUCC 125 MG IJ SOLR: 125.0000 mg | Freq: Once | INTRAMUSCULAR | Status: DC | PRN

## 2020-08-21 MED ORDER — FAMOTIDINE IN NACL 20-0.9 MG/50ML-% IV SOLN: 20.0000 mg | Freq: Once | INTRAVENOUS | Status: DC | PRN

## 2020-08-21 MED ORDER — DIPHENHYDRAMINE HCL 50 MG/ML IJ SOLN: 50.0000 mg | Freq: Once | INTRAMUSCULAR | Status: DC | PRN

## 2020-08-21 MED ORDER — SODIUM CHLORIDE 0.9 % IV SOLN
INTRAVENOUS | Status: DC | PRN
  Administered 2020-08-21: 500 mL via INTRAVENOUS

## 2020-08-21 MED ORDER — EPINEPHRINE 0.3 MG/0.3ML IJ SOAJ: 0.3000 mg | Freq: Once | INTRAMUSCULAR | Status: DC | PRN

## 2020-08-21 NOTE — Telephone Encounter (Signed)
Called in with persistent sx of right lung pain, congestion for last 4-6 weeks , no better after ABX 1 month ago . Getting worse. Called in yesterday with recs to go to ER .  Patient advised going to [facility] today to be seen and get xray. Advised that is a good idea. Needs f/up when in office in couple of weeks  Please contact office for sooner follow up if symptoms do not improve or worsen or seek emergency care   Catrina Fellenz NP-C

## 2020-08-21 NOTE — ED Provider Notes (Signed)
[facility] EMERGENCY DEPARTMENT Provider Note   CSN: [id number] Arrival date & time: 08/21/20  0950     History Chief Complaint  Patient presents with  . Cough    Molly Ross is a 79 y.o. female.  HPI   Pt has been having trouble for 6 weeks.  She has been coughing up green yellow sputum.  She has not had any fevers for several weeks.  Pt is also having in chest on the right side.  It goes all the way through.  It increases with coughing and breathing.  Pt has been vaccinated for covid.  Pt had a course of abx called in for her several weeks ago.  She has not seen anyone for it until today.  Past Medical History:  Diagnosis Date  . Atherosclerosis of aorta ([place]) 02/02/2015   Noted on imaging   . Bronchiectasis ([place])   . Bronchiectasis with (acute) exacerbation ([place]) 06/09/2015  . CAD (coronary artery disease), native coronary artery    Coronary calcification noted on CT scan 2016  Negative treadmill myoview 07/20/15 with EF 76%   . Cancer ([place])   . Cellulitis of left breast 10/01/2018  . Colon polyp   . COPD, moderate ([place]) 11/06/2015  . Depressive disorder, not elsewhere classified 12/04/2011  . Dyspnea   . Emphysema of lung ([place]) 02/02/2015  . Family history of lung cancer   . Family history of ovarian cancer   . Family history of prostate cancer   . Genetic testing 03/27/2018   The Common Hereditary Cancer Panel offered by [facility] includes sequencing and/or deletion duplication testing of the following 47 genes: APC, ATM, AXIN2, BARD1, BMPR1A, BRCA1, BRCA2, BRIP1, CDH1, CDKN2A (p14ARF), CDKN2A (p16INK4a), CKD4, CHEK2, CTNNA1, DICER1, EPCAM (Deletion/duplication testing only), GREM1 (promoter region deletion/duplication testing only), KIT, MEN1, MLH1, MSH2, MSH3, MSH6, MU  . GERD (gastroesophageal reflux disease)   . Hiatal hernia 02/02/2015   small, noted on CT of chest   . History of radiation therapy 04/16/18- 05/14/18   42.56 Gy directed to the Left Breast in 16  fractions followed by a boost of 8 Gy in 4 fractions  . History of smoking 25-50 pack years 06/09/2015  . 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 Osteopenia 02/18/2018   DEXA 02/2018, T-1.8 R femoral neck  . Personal history of radiation therapy 2019  . Pleuritic pain 10/01/2018  . Shingles 1/09, 07/2010, 11/2017  . Unspecified vitamin D deficiency 02/2011 and 2016  . Vitamin D deficiency 02/14/2011    Patient Active Problem List   Diagnosis Date Noted  . Senile purpura ([place]) 08/25/2019  . Hyperlipidemia 02/09/2019  . Cellulitis of left breast 10/01/2018  . Pleuritic pain 10/01/2018  . Genetic testing 03/27/2018  . Family history of ovarian cancer   . Family history of lung cancer   . Family history of prostate cancer   . 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 Osteopenia 02/18/2018  . COPD, moderate ([place]) 11/06/2015  . Coronary artery calcification seen on CT scan   . Bronchiectasis with (acute) exacerbation ([place]) 06/09/2015  . History of smoking 25-50 pack years 06/09/2015  . Atherosclerosis of aorta ([place]) 02/02/2015  . Hiatal hernia 02/02/2015  . Emphysema of lung ([place]) 02/02/2015  . Depressive disorder, not elsewhere classified 12/04/2011  . Vitamin D deficiency 02/14/2011    Past Surgical History:  Procedure Laterality  Date  . BREAST EXCISIONAL BIOPSY Right    benign years ago  . BREAST LUMPECTOMY Left 2019  . BREAST LUMPECTOMY WITH RADIOACTIVE SEED AND SENTINEL LYMPH NODE BIOPSY Left 03/19/2018   Procedure: BREAST LUMPECTOMY WITH RADIOACTIVE SEED AND SENTINEL LYMPH NODE BIOPSY;  Surgeon: Stark Klein, MD;  Location: [facility];  Service: General;  Laterality: Left;  . CATARACT EXTRACTION, BILATERAL Bilateral 03/2017, 04/2017   [facility], Dr. Alanda Slim  . SHOULDER SURGERY  03/2011   for frozen shoulder, and cyst  removal (Dr. Percell Miller)  . TONSILLECTOMY  child  . VIDEO BRONCHOSCOPY Bilateral 06/21/2016   Procedure: VIDEO BRONCHOSCOPY WITHOUT FLUORO;  Surgeon: Brand Males, MD;  Location: [facility];  Service: Cardiopulmonary;  Laterality: Bilateral;     OB History    Gravida  2   Para      Term      Preterm      AB  1   Living  1     SAB  1   TAB      Ectopic      Multiple      Live Births              Family History  Problem Relation Age of Onset  . Stroke Mother   . Diabetes Mother   . Stroke Sister 44       brainstem stroke  . Diabetes Sister   . Seizures Sister   . Lung cancer Father 61  . Ovarian cancer Sister 61       metastatic to lung, liver  . Cancer Sister   . Ovarian cancer Maternal Aunt 95  . Heart disease Maternal Grandmother   . Heart disease Paternal Grandmother   . Prostate cancer Cousin   . Lung cancer Cousin        recurrent, now stage 4  . Breast cancer Neg Hx     Social History   Tobacco Use  . Smoking status: Former Smoker    Packs/day: 2.00    Years: 24.00    Pack years: 48.00    Types: Cigarettes    Quit date: 06/28/1980    Years since quitting: 40.1  . Smokeless tobacco: Never Used  . Tobacco comment: smoked 1-2 PPD x 24 years;  Vaping Use  . Vaping Use: Never used  Substance Use Topics  . Alcohol use: Yes    Alcohol/week: 0.0 standard drinks    Comment: 1 glass of wine once a week or less  . Drug use: No    Home Medications Prior to Admission medications   Medication Sig Start Date End Date Taking? Authorizing Provider  alendronate (FOSAMAX) 70 MG tablet Take 1 tablet (70 mg total) by mouth every 7 (seven) days. Take with a full glass of water on an empty stomach. 03/02/20   Rita Ohara, MD  Ascorbic Acid (VITAMIN C) 100 MG tablet Take 1 tablet (100 mg total) by mouth daily. 04/01/18   Nicholas Lose, MD  aspirin EC 81 MG tablet Take 81 mg by mouth daily.    [provider]  atorvastatin (LIPITOR) 20 MG tablet  TAKE 1 TABLET BY MOUTH EVERY DAY 06/07/20   Rita Ohara, MD  Calcium-Magnesium-Vitamin D (CALCIUM 500 PO) Take 1 tablet by mouth daily.    [provider]  doxycycline (VIBRA-TABS) 100 MG tablet Take 1 tablet (100 mg total) by mouth 2 (two) times daily. 07/24/20   Lauraine Rinne, NP  fluticasone furoate-vilanterol (BREO ELLIPTA) 100-25  MCG/INH AEPB INHALE 1 PUFF BY MOUTH EVERY DAY 03/06/20   Brand Males, MD  ipratropium-albuterol (DUONEB) 0.5-2.5 (3) MG/3ML SOLN Take 3 mLs by nebulization every 4 (four) hours as needed. Patient not taking: Reported on 03/02/2020 11/19/18   Brand Males, MD  letrozole [facility] ) 2.5 MG tablet TAKE 1 TABLET BY MOUTH EVERY DAY 06/05/20   Nicholas Lose, MD  Multiple Vitamins-Minerals (MULTIVITAMIN WITH MINERALS) tablet Take 1 tablet by mouth daily.    [provider]  omeprazole (PRILOSEC OTC) 20 MG tablet Take 20 mg by mouth daily.    [provider]  sodium chloride HYPERTONIC 3 % nebulizer solution Take by nebulization as needed for other. Patient not taking: Reported on 03/02/2020 11/19/18   Brand Males, MD  SPIRIVA RESPIMAT 2.5 MCG/ACT AERS INHALE 2 PUFFS BY MOUTH TWICE A DAY 04/19/20   Brand Males, MD  valACYclovir (VALTREX) 1000 MG tablet Take 1 tablet (1,000 mg total) by mouth 3 (three) times daily. 04/14/20   Denita Lung, MD    Allergies    Patient has no known allergies.  Review of Systems   Review of Systems  All other systems reviewed and are negative.   Physical Exam Updated Vital Signs BP 137/67   Pulse 73   Temp 98.5 F (36.9 C) (Oral)   Resp 18   Ht 1.753 m (_0 )   Wt 63.5 kg   SpO2 98%   BMI 20.67 kg/m   Physical Exam Vitals and nursing note reviewed.  Constitutional:      Appearance: She is well-developed. She is not toxic-appearing or diaphoretic.  HENT:     Head: Normocephalic and atraumatic.     Right Ear: External ear normal.     Left Ear: External ear normal.  Eyes:     General:  No scleral icterus.       Right eye: No discharge.        Left eye: No discharge.     Conjunctiva/sclera: Conjunctivae normal.  Neck:     Trachea: No tracheal deviation.  Cardiovascular:     Rate and Rhythm: Normal rate and regular rhythm.  Pulmonary:     Effort: Pulmonary effort is normal. No respiratory distress.     Breath sounds: No stridor. Rales present. No wheezing.     Comments: Crackles right side Abdominal:     General: Bowel sounds are normal. There is no distension.     Palpations: Abdomen is soft.     Tenderness: There is no abdominal tenderness. There is no guarding or rebound.  Musculoskeletal:        General: No tenderness.     Cervical back: Neck supple.  Skin:    General: Skin is warm and dry.     Findings: No rash.  Neurological:     Mental Status: She is alert.     Cranial Nerves: No cranial nerve deficit (no facial droop, extraocular movements intact, no slurred speech).     Sensory: No sensory deficit.     Motor: No abnormal muscle tone or seizure activity.     Coordination: Coordination normal.     ED Results / Procedures / Treatments   Labs (all labs ordered are listed, but only abnormal results are displayed) Labs Reviewed  SARS CORONAVIRUS 2 BY RT PCR (HOSPITAL ORDER, [facility]) - Abnormal; Notable for the following components:      Result Value   SARS Coronavirus 2 POSITIVE (*)    All other  components within normal limits  BASIC METABOLIC PANEL - Abnormal; Notable for the following components:   Creatinine, Ser 0.42 (*)    All other components within normal limits  CBC WITH DIFFERENTIAL/PLATELET    EKG None  Radiology DG Chest 2 View  Result Date: 08/21/2020 CLINICAL DATA:  Chest pain, cough. EXAM: CHEST - 2 VIEW COMPARISON:  July 24, 2020. FINDINGS: The heart size and mediastinal contours are within normal limits. No pneumothorax or pleural effusion is noted. Stable bilateral lung opacities are noted which may  represent chronic scarring or fibrosis, but acute superimposed inflammation cannot be excluded. The visualized skeletal structures are unremarkable. IMPRESSION: Stable bilateral lung opacities are noted which may represent chronic scarring or fibrosis, but acute superimposed inflammation cannot be excluded. Electronically Signed   By: Marijo Conception M.D.   On: 08/21/2020 10:18    Procedures Procedures (including critical care time)  Medications Ordered in ED Medications  0.9 %  sodium chloride infusion ( Intravenous Stopped 08/21/20 1514)  diphenhydrAMINE (BENADRYL) injection 50 mg (has no administration in time range)  famotidine (PEPCID) IVPB 20 mg premix (has no administration in time range)  methylPREDNISolone sodium succinate (SOLU-MEDROL) 125 mg/2 mL injection 125 mg (has no administration in time range)  albuterol (VENTOLIN HFA) 108 (90 Base) MCG/ACT inhaler 2 puff (has no administration in time range)  EPINEPHrine (EPI-PEN) injection 0.3 mg (has no administration in time range)  cefTRIAXone (ROCEPHIN) 1 g in sodium chloride 0.9 % 100 mL IVPB (0 g Intravenous Stopped 08/21/20 1159)  casirivimab-imdevimab (REGEN-COV) 1,200 mg in sodium chloride 0.9 % 110 mL IVPB (0 mg Intravenous Stopped 08/21/20 1446)    ED Course  I have reviewed the triage vital signs and the nursing notes.  Pertinent labs & imaging results that were available during my care of the patient were reviewed by me and considered in my medical decision making (see chart for details).  Clinical Course as of Aug 21 1536  Mon Aug 21, 2020  1049 Chest x-ray with chronic changes.  Unable to exclude infection   [JK]  1320 Patient's Covid test is positive.  She has multiple risk factors with her chronic lung disease and her age.  She is agreeable to monoclonal antibody infusion.   [JK]    Clinical Course User Index [JK] Dorie Rank, MD   MDM Rules/Calculators/A&P                          Patient presented to ED for  evaluation of cough and congestion.  Patient has history of bronchiectasis.  Fortunately patient remained hemodynamically stable.  She does not have an oxygen requirement.  Patient has been vaccinated for Covid but unfortunately her Covid test was positive.  With her age and pulmonary risk factors patient was started on monoclonal antibody infusion.  Pt will be monitored in the ED until complete.  Anticipate discharge following her infusion.   Final Clinical Impression(s) / ED Diagnoses Final diagnoses:  COVID-19 virus infection    Rx / DC Orders ED Discharge Orders    None       Dorie Rank, MD 08/21/20 1538

## 2020-08-21 NOTE — ED Triage Notes (Signed)
Cough x several weeks. Hx of bronchitis-states that this feels the same. denies fever.

## 2020-08-21 NOTE — ED Notes (Addendum)
Per pharmacy program pump manually at rate showing on bag and stay in pt room for 15 minutes

## 2020-08-22 ENCOUNTER — Telehealth: Payer: Self-pay | Admitting: Internal Medicine

## 2020-08-22 NOTE — Telephone Encounter (Signed)
Called to discuss with patient about Covid symptoms and the use of bamlanivimab, a monoclonal antibody infusion for those with mild to moderate Covid symptoms and at high risk of hospitalization.   Patient received infusion at time of ED visit on 08/21/2020.  She reports she is feeling somewhat better this morning.  Isolation guidelines reviewed.     Alan Ripper, [place]

## 2020-08-23 ENCOUNTER — Telehealth: Payer: Self-pay | Admitting: *Deleted

## 2020-08-23 NOTE — Telephone Encounter (Signed)
Spoke with patient.  Feeling better today, less pain when breathing in, breathing better. Explained quarantine (not shortened).  Exposed at work on Friday from her boss symptoms started Sunday.

## 2020-08-23 NOTE — Telephone Encounter (Signed)
Patient seen in ER and diagnosed with COVID and pneumonia 08/23/20, symptoms started Sun night at 3am. She said she was treated with IV for pneumo and had infusion for COVID. She is wanting to know how many days she has to quarantine for and does the infusion lessen the quarantine time?

## 2020-08-24 NOTE — Telephone Encounter (Signed)
Patient update for you from [place]. FYI  FYI... After extreme pain in right lung and talking to Tammy P Monday morning, I went to [facility] on [address] in [facility].  X-ray revealed pneumonia. Antibiotic through IV was given, then test for Covid tested positive. Dr Dorie Rank then ordered the Infusion.  I am at home in quarantine and feeling better.  Thankful for the absolute great care from [facility] in [facility]!

## 2020-08-24 NOTE — Telephone Encounter (Signed)
FYI to Dr. Chase Caller. See patient's MyChart message below.

## 2020-08-25 ENCOUNTER — Telehealth: Payer: Self-pay | Admitting: Internal Medicine

## 2020-08-25 NOTE — Telephone Encounter (Signed)
Dr. Chase Caller please advise on patient mychart message  I have an appointment for a pulmonary test on October 1 at 11am.  Based on my current condition, should that appointment not be pushed back?

## 2020-08-25 NOTE — Telephone Encounter (Signed)
Posting this message in the mychart message that pt previously sent 9/9.

## 2020-08-25 NOTE — Telephone Encounter (Signed)
If she has PFT she can cancel but if she feels well enough she should come 09/15/20

## 2020-09-06 ENCOUNTER — Other Ambulatory Visit: Payer: Self-pay | Admitting: Family Medicine

## 2020-09-06 DIAGNOSIS — I700 Atherosclerosis of aorta: Secondary | ICD-10-CM

## 2020-09-06 NOTE — Progress Notes (Signed)
Chief Complaint  Patient presents with  . Medicare Wellness    fasting AWV/CPE, no new concerns.   . Depression Screen    (1)   Molly Ross is a 79 y.o. female who presents for annual physical exam, Medicare wellness visit and follow-up on chronic medical conditions. She has the following concerns:  She has had some recent stress, as her sister is currently in the hospital (for anemia related to vaginal bleeding).  They had just gotten her settled at [facility], to live permanently, but needed to give up her bed when she went in the hospital (too expensive), and now she is stressed about finding her another SNF to reside at upon discharge.    She had COVID-19 infection 9/6, treated with monoclonal antibodies.  She still has some persistent productive cough, sporadically gets up discolored phlegm (green).  Her breathing is at baseline (see below).  She had Shingles in 03/2020, in gluteal area, treated with Valtrex. She has had shingles prior to that as well.  She has not yet gotten Shingrix.  Osteopenia:  She was started on Alendronate in 02/2020 for osteopenia with elevated FRAX score (4.6% for hip). She is tolerating this without any side effects.  Left breast cancer 02/2018, s/p lumpectomy and radiation. She is on letrozole and doing well, denies side side effects. She had developed lymphedema of the breast, had physical therapy.  She hasn't had any further problems, periodically does her exercises, massage. She denies any breast complaints today. She is due to see Dr. Lindi Adie in November.  CAD: She was noted to have coronary calcification in 2 vessels on CT scan in 2016, saw Dr. Wynonia Lawman and had a myocardial perfusion scan. She had no evidence of ischemia with an ejection fraction of 76%. She has been compliant with taking aspirin and statin, and denies side effects.She is doing well on 41m atorvastatin (had some issues with discomfort in leg on the initial 426mdose).She follows a lowfat,  low cholesterol diet.She saw Dr. MuBettina Gavian 01/2019; shedeferredanotherischemia evaluation,not having angina. She continues to have DOE, unchanged,related to her underlying lung disease.  Emphysema and bronchiectasis: Under the care of pulmonologist. She continues on Breo, Spiriva Respimat, flutter valve (sporadically) and hypertonic saline nebulizers (only if needed).  She is scheduled for f/u with pulm soon.  She recently had COVID, as reported above. She hasn't needed albuterol inhaler in a few months (not with her recent COVID illness).  H/oVitamin D deficiency: s/p 12 weeks of rx replacement in February 2016. Last vitamin D levelwas52.2in1/2019. She is currently taking Ca+D once daily, MVI daily and Vitamin D 1000 IU daily.  Immunization History  Administered Date(s) Administered  . Fluad Quad(high Dose 65+) 08/25/2019  . Influenza, High Dose Seasonal PF 01/26/2014, 10/19/2014, 08/30/2015, 08/08/2016, 08/05/2017, 09/02/2018  . Moderna SARS-COVID-2 Vaccination 12/29/2019, 01/26/2020  . Pneumococcal Conjugate-13 12/25/2015  . Pneumococcal Polysaccharide-23 12/30/2016  . Tdap 09/03/2017   Last Pap smear:12/2015, normal, no high risk HPV detected Last mammogram:02/2020 Last colonoscopy: 10/2017; 7 tubular adenomas found, and diverticulosis. Repeat due 10/2020 (3 yr f/u). Last DEXA:02/2020 T-2.1 R fem neck, FRAX elevated at 4.6% hip Dentist:twiceyearly Ophtho:yearly Exercise:Very limited since August due to illnesses. Prior to that had been walking some, on the days she wasn't working, Does some weight-bearing exercise for upper body (body weight--pushing self up on chair).  Other doctors caring for patient include: Pulmonary: Dr. RaChase Callerardiology: Dr. TiVladimir Fasterentist:Dr. SaJoelyn Omsates(in [place]) GI: Dr. DrHoy Mornat EaJemisonOphtho: Dr. StTeodoro Spray  Dr. Alanda Slim did her cataract surgery Oncologist: Dr. Lindi Adie Radiation Oncologist: Dr. Eppie Gibson Surgeon: Dr. Barry Dienes  Depression screen: PHQ-2 score of 1 (a little down, sister in hospital).  Fall Screen: negative Functional Status screen: notable only forsome leakage of urine (with cough/sneeze). Mini-Cog screen: normal  See full questionnaires in epic.  End of Life Discussion: Patient hasa living will and medical power of attorney (scanned 03/2018).  PMH, PSH, SH and FH were reviewed and updated.  Outpatient Encounter Medications as of 09/07/2020  Medication Sig  . alendronate (FOSAMAX) 70 MG tablet Take 1 tablet (70 mg total) by mouth every 7 (seven) days. Take with a full glass of water on an empty stomach.  . Ascorbic Acid (VITAMIN C) 100 MG tablet Take 1 tablet (100 mg total) by mouth daily.  Marland Kitchen aspirin EC 81 MG tablet Take 81 mg by mouth daily.  Marland Kitchen atorvastatin (LIPITOR) 20 MG tablet TAKE 1 TABLET BY MOUTH EVERY DAY  . Calcium-Magnesium-Vitamin D (CALCIUM 500 PO) Take 1 tablet by mouth daily.  . cholecalciferol (VITAMIN D3) 25 MCG (1000 UNIT) tablet Take 1,000 Units by mouth daily.  . fluticasone furoate-vilanterol (BREO ELLIPTA) 100-25 MCG/INH AEPB INHALE 1 PUFF BY MOUTH EVERY DAY  . letrozole (FEMARA) 2.5 MG tablet TAKE 1 TABLET BY MOUTH EVERY DAY  . Multiple Vitamins-Minerals (MULTIVITAMIN WITH MINERALS) tablet Take 1 tablet by mouth daily.  Marland Kitchen SPIRIVA RESPIMAT 2.5 MCG/ACT AERS INHALE 2 PUFFS BY MOUTH TWICE A DAY  . ipratropium-albuterol (DUONEB) 0.5-2.5 (3) MG/3ML SOLN Take 3 mLs by nebulization every 4 (four) hours as needed. (Patient not taking: Reported on 03/02/2020)  . omeprazole (PRILOSEC OTC) 20 MG tablet Take 20 mg by mouth daily. (Patient not taking: Reported on 09/07/2020)  . sodium chloride HYPERTONIC 3 % nebulizer solution Take by nebulization as needed for other. (Patient not taking: Reported on 03/02/2020)  . [DISCONTINUED] doxycycline (VIBRA-TABS) 100 MG tablet Take 1 tablet (100 mg total) by mouth 2 (two) times daily.  . [DISCONTINUED] valACYclovir  (VALTREX) 1000 MG tablet Take 1 tablet (1,000 mg total) by mouth 3 (three) times daily.   No facility-administered encounter medications on file as of 09/07/2020.   No Known Allergies  ROS: The patient denies anorexia, fever, headaches, vision changes, decreased hearing, ear pain, sore throat, breast concerns, chest pain, palpitations, dizziness, syncope, dyspnea on exertion, cough, swelling, nausea, vomiting, diarrhea, constipation, abdominal pain, melena, hematochezia, indigestion/heartburn, hematuria, incontinence(some stress incontinence), dysuria, vaginal bleeding, discharge, odor or itch, genital lesions, joint pains, numbness, tingling, weakness, tremor, suspicious skin lesions, depression, abnormal bleeding/bruising, or enlarged lymph nodes. Breathing isat baseline/improved.  Cough per HPI. Only occasionally has heartburn    PHYSICAL EXAM:  BP 140/70   Pulse 80   Ht '5\' 8"'  (1.727 m)   Wt 150 lb 9.6 oz (68.3 kg)   BMI 22.90 kg/m   128/64 on repeat by MD  Wt Readings from Last 3 Encounters:  09/07/20 150 lb 9.6 oz (68.3 kg)  08/21/20 140 lb (63.5 kg)  04/14/20 149 lb 9.6 oz (67.9 kg)    General Appearance:  Alert, cooperative, no distress, appears stated age  Head:  Normocephalic, without obvious abnormality, atraumatic  Eyes:  PERRL, conjunctiva/corneas clear, EOM's intact, fundi benign  Ears:  Normal TM's and external ear canals  Nose: Not examined, wearing mask due to COVID-19 pandemic  Throat: Not examined, wearing mask due to COVID-19 pandemic  Neck: Supple, no lymphadenopathy; thyroid: no enlargement/tenderness/ nodules; no carotid bruit or JVD  Back:  Spine nontender, no curvature, ROM normal, no CVA tenderness  Lungs:  Clear to auscultation bilaterally; good air movement.Nowheezes, orronchi; respirations unlabored. No coughing during visit.  Chest Wall:  No tenderness or deformity  Heart:  Regular rate and rhythm, S1 and S2 normal,  no murmur, rub or gallop.   Breast Exam:  No tenderness, masses, or nipple discharge or inversion on the right. WHSS in upper breast.  Left breast has WHSS, below areola. There is very slight color discoloration of the left breast (slightly tan/yellow, compared to the right).  There is some tenderness at L UOQ, and also posteriorly to the L breast at 4 o'clock position.  Minimally swollen in this area. No axillary lymphadenopathy.  No masses.  Abdomen:  Soft, non-tender, nondistended, normoactive bowel sounds, no masses, no hepatosplenomegaly  Genitalia:  Normal external genitalia without lesions, some atrophy noted. BUS and vagina normal;nocervical motion tenderness. No abnormal vaginal discharge. Uterus and adnexa not enlarged, nontender, no masses. Papnotperformed  Rectal:  Normal sphincter tone, no masses.  Heme negative stool  Extremities: No clubbing, cyanosis or edema  Pulses: 2+ and symmetric all extremities  Skin: Skin color, texture, turgor normal, no lesions. She has many seborrheic keratoses throughout her body many of which are large. There are also many scattered angiomas. Left mid-shin--hyperkeratotic lesions--1 is 40m (and white), central one is 4x76m and slightly gray. (unchanged in size/appearance from last year). Purpura on forearms bilaterally.  Lymph nodes: Cervical, supraclavicular, and axillary nodes normal  Neurologic: CNII-XII intact, normal strength, sensation and gait; reflexes 2+ and symmetric throughout  Psych: Normal mood, affect, hygiene and grooming   ASSESSMENT/PLAN:  Annual physical exam - Plan: Lipid panel, Comprehensive metabolic panel  Medicare annual wellness visit, subsequent  Osteopenia of right hip - continue alendronate, Ca, D, weight-bearing exercise again reviewed  Coronary artery disease involving native coronary artery of native heart without angina pectoris  Bronchiectasis without  complication (HCC) - stable on current regimen per pulm  COPD, moderate (HCC) - stable on current regimen, per pulm. No significant flare with recent COVID. Slight discoloration to phlegm--to d/w pulm if persists/worsens  Vitamin D deficiency - continue current supplements  Senile purpura ([place]- contributed by aspirin. not bothersome  Malignant neoplasm of lower-inner quadrant of left breast in female, estrogen receptor positive (HCR[place]- cont on letrozole and f/u with onc as planned.  Breast findings likely related to lymphedema and/or post-radiation.  unchanged per pt  Need for influenza vaccination - Plan: Flu Vaccine QUAD High Dose(Fluad)  Need for hepatitis C screening test - Plan: Hepatitis C antibody  Medication monitoring encounter - Plan: Lipid panel, Comprehensive metabolic panel  Atherosclerosis of aorta (HCC) - cont ASA, lipitor - Plan: atorvastatin (LIPITOR) 20 MG tablet  Lipid, c-met, hep C (had recent b-met and CBC in ER)  Discussed monthly self breast exams and yearly mammograms; at least 30 minutes of aerobic activity at least 5 days/week and weight-bearing exercise 2x/week; proper sunscreen use reviewed; healthy diet, including goals of calcium and vitamin D intake and alcohol recommendations (less than or equal to 1 drink/day) reviewed; regular seatbelt use; changing batteries in smoke detectors. Immunization recommendations discussed--Shingrixrecommended, to get from pharmacy (risks/SE reviewed). Continue yearly high dose flu shots, given today.Discussed COVID boosters (will be a candidate, need to wait and see recommendations, poss 1/2 dose for Moderna). ColonoscopyUTD, repeat due 10/2020.   F/u 1 year (AWV/CPE), sooner prn.  Full Code, Full care MOST form reviewed, updated.   Medicare Attestation I have  personally reviewed: The patient's medical and social history Their use of alcohol, tobacco or illicit drugs Their current medications and supplements The  patient's functional ability including ADLs,fall risks, home safety risks, cognitive, and hearing and visual impairment Diet and physical activities Evidence for depression or mood disorders  The patient's weight, height, BMI have been recorded in the chart.  I have made referrals, counseling, and provided education to the patient based on review of the above and I have provided the patient with a written personalized care plan for preventive services.

## 2020-09-06 NOTE — Patient Instructions (Addendum)
HEALTH MAINTENANCE RECOMMENDATIONS:  It is recommended that you get at least 30 minutes of aerobic exercise at least 5 days/week (for weight loss, you may need as much as 60-90 minutes). This can be any activity that gets your heart rate up. This can be divided in 10-15 minute intervals if needed, but try and build up your endurance at least once a week.  Weight bearing exercise is also recommended twice weekly.  Eat a healthy diet with lots of vegetables, fruits and fiber.  "Colorful" foods have a lot of vitamins (ie green vegetables, tomatoes, red peppers, etc).  Limit sweet tea, regular sodas and alcoholic beverages, all of which has a lot of calories and sugar.  Up to 1 alcoholic drink daily may be beneficial for women (unless trying to lose weight, watch sugars).  Drink a lot of water.  Calcium recommendations are 1200-1500 mg daily (1500 mg for postmenopausal women or women without ovaries), and vitamin D 1000 IU daily.  This should be obtained from diet and/or supplements (vitamins), and calcium should not be taken all at once, but in divided doses.  Monthly self breast exams and yearly mammograms for women over the age of 41 is recommended.  Sunscreen of at least SPF 30 should be used on all sun-exposed parts of the skin when outside between the hours of 10 am and 4 pm (not just when at beach or pool, but even with exercise, golf, tennis, and yard work!)  Use a sunscreen that says "broad spectrum" so it covers both UVA and UVB rays, and make sure to reapply every 1-2 hours.  Remember to change the batteries in your smoke detectors when changing your clock times in the spring and fall. Carbon monoxide detectors are recommended for your home.  Use your seat belt every time you are in a car, and please drive safely and not be distracted with cell phones and texting while driving.   Molly Ross , Thank you for taking time to come for your Medicare Wellness Visit. I appreciate your ongoing  commitment to your health goals. Please review the following plan we discussed and let me know if I can assist you in the future.    This is a list of the screening recommended for you and due dates:  Health Maintenance  Topic Date Due  .  Hepatitis C: One time screening is recommended by Center for Disease Control  (CDC) for  adults born from [birth year] through 1965.   Never done  . Flu Shot  07/16/2020  . Colon Cancer Screening  11/11/2020  . Tetanus Vaccine  09/04/2027  . DEXA scan (bone density measurement)  Completed  . COVID-19 Vaccine  Completed  . Pneumonia vaccines  Completed   They are now recommending hepatitis C screening for ALL adults (not just the baby boomers).  We will do this with your bloodwork today.  I recommend getting the new shingles vaccine (Shingrix). You will need to get this from the pharmacy rather than our office (covered by Medicare Part D).  It is a series of 2 injections, spaced 2 months apart.  You have had shingles a couple of times, this vaccine is 90% effective in preventing it.  Next bone density test will be due 02/2022. Continue yearly mammograms.  You are due for repeat colonoscopy in 10/2020--you should contact [facility] and discuss if this is appropriate or not.  We discussed possibly getting a COVID booster at some point--not needed for 3 months due to  recent infection.  Pay attention to the news regarding recommendations

## 2020-09-07 ENCOUNTER — Ambulatory Visit (INDEPENDENT_AMBULATORY_CARE_PROVIDER_SITE_OTHER): Payer: Medicare Other | Admitting: Family Medicine

## 2020-09-07 ENCOUNTER — Other Ambulatory Visit: Payer: Self-pay

## 2020-09-07 ENCOUNTER — Encounter: Payer: Self-pay | Admitting: Family Medicine

## 2020-09-07 VITALS — BP 128/64 | HR 80 | Ht 68.0 in | Wt 150.6 lb

## 2020-09-07 DIAGNOSIS — J479 Bronchiectasis, uncomplicated: Secondary | ICD-10-CM

## 2020-09-07 DIAGNOSIS — Z5181 Encounter for therapeutic drug level monitoring: Secondary | ICD-10-CM | POA: Diagnosis not present

## 2020-09-07 DIAGNOSIS — Z23 Encounter for immunization: Secondary | ICD-10-CM

## 2020-09-07 DIAGNOSIS — Z0000 Encounter for general adult medical examination without abnormal findings: Secondary | ICD-10-CM | POA: Diagnosis not present

## 2020-09-07 DIAGNOSIS — E559 Vitamin D deficiency, unspecified: Secondary | ICD-10-CM | POA: Diagnosis not present

## 2020-09-07 DIAGNOSIS — M85851 Other specified disorders of bone density and structure, right thigh: Secondary | ICD-10-CM | POA: Diagnosis not present

## 2020-09-07 DIAGNOSIS — C50312 Malignant neoplasm of lower-inner quadrant of left female breast: Secondary | ICD-10-CM

## 2020-09-07 DIAGNOSIS — D692 Other nonthrombocytopenic purpura: Secondary | ICD-10-CM

## 2020-09-07 DIAGNOSIS — I2510 Atherosclerotic heart disease of native coronary artery without angina pectoris: Secondary | ICD-10-CM | POA: Diagnosis not present

## 2020-09-07 DIAGNOSIS — Z170 Estrogen receptor positive status [ER+]: Secondary | ICD-10-CM

## 2020-09-07 DIAGNOSIS — Z1159 Encounter for screening for other viral diseases: Secondary | ICD-10-CM

## 2020-09-07 DIAGNOSIS — I700 Atherosclerosis of aorta: Secondary | ICD-10-CM

## 2020-09-07 DIAGNOSIS — J449 Chronic obstructive pulmonary disease, unspecified: Secondary | ICD-10-CM

## 2020-09-08 LAB — LIPID PANEL
Chol/HDL Ratio: 1.8 ratio (ref 0.0–4.4)
Cholesterol, Total: 164 mg/dL (ref 100–199)
HDL: 89 mg/dL (ref >39)
LDL Chol Calc (NIH): 65 mg/dL (ref 0–99)
Triglycerides: 45 mg/dL (ref 0–149)
VLDL Cholesterol Cal: 10 mg/dL (ref 5–40)

## 2020-09-08 LAB — COMPREHENSIVE METABOLIC PANEL
ALT: 10 IU/L (ref 0–32)
AST: 18 IU/L (ref 0–40)
Albumin/Globulin Ratio: 1.7 (ref 1.2–2.2)
Albumin: 4.7 g/dL (ref 3.7–4.7)
Alkaline Phosphatase: 112 IU/L (ref 44–121)
BUN/Creatinine Ratio: 22 (ref 12–28)
BUN: 12 mg/dL (ref 8–27)
Bilirubin Total: 1.2 mg/dL (ref 0.0–1.2)
CO2: 26 mmol/L (ref 20–29)
Calcium: 10.1 mg/dL (ref 8.7–10.3)
Chloride: 101 mmol/L (ref 96–106)
Creatinine, Ser: 0.54 mg/dL — ABNORMAL LOW (ref 0.57–1.00)
GFR calc Af Amer: 105 mL/min/{1.73_m2} (ref >59)
GFR calc non Af Amer: 91 mL/min/{1.73_m2} (ref >59)
Globulin, Total: 2.8 g/dL (ref 1.5–4.5)
Glucose: 79 mg/dL (ref 65–99)
Potassium: 4.5 mmol/L (ref 3.5–5.2)
Sodium: 142 mmol/L (ref 134–144)
Total Protein: 7.5 g/dL (ref 6.0–8.5)

## 2020-09-08 LAB — HEPATITIS C ANTIBODY: Hep C Virus Ab: <0.1 s/co ratio (ref 0.0–0.9)

## 2020-09-08 MED ORDER — ATORVASTATIN CALCIUM 20 MG PO TABS: 20.0000 mg | ORAL_TABLET | Freq: Every day | ORAL | 3 refills | Status: DC

## 2020-09-10 ENCOUNTER — Other Ambulatory Visit: Payer: Self-pay | Admitting: Hematology and Oncology

## 2020-10-20 ENCOUNTER — Ambulatory Visit: Payer: Medicare Other | Admitting: Critical Care Medicine

## 2020-10-20 NOTE — Progress Notes (Deleted)
Synopsis: Referred in 2016 for abnormal CT scan by Molly Ohara, MD. Formerly a patient of Dr. Chase Caller.  Subjective:   PATIENT ID: Molly Ross GENDER: female DOB: 1941/05/16, MRN: [id number]  No chief complaint on file.   HPI  Last saw MR 01/2020  covid in sept 2021> mAb infusion  Bronchiectasis exacerbation in august R sided CP   Former tobacco abuse-quit 34 years ago  Multiple cases of pneumonia in 1970s and 80s Pneumonia in 2016; CT scan then with RML bronchiectasis. Was placed on Breo inhaler   duonebs how often Thomasena Edis    Past Medical History:  Diagnosis Date  . Atherosclerosis of aorta ([place]) 02/02/2015   Noted on imaging   . Bronchiectasis ([place])   . Bronchiectasis with (acute) exacerbation ([place]) 06/09/2015  . CAD (coronary artery disease), native coronary artery    Coronary calcification noted on CT scan 2016  Negative treadmill myoview 07/20/15 with EF 76%   . Cancer ([place])   . Cellulitis of left breast 10/01/2018  . Colon polyp   . COPD, moderate ([place]) 11/06/2015  . Depressive disorder, not elsewhere classified 12/04/2011  . Dyspnea   . Emphysema of lung ([place]) 02/02/2015  . Family history of lung cancer   . Family history of ovarian cancer   . Family history of prostate cancer   . Genetic testing 03/27/2018   The Common Hereditary Cancer Panel offered by [facility] includes sequencing and/or deletion duplication testing of the following 47 genes: APC, ATM, AXIN2, BARD1, BMPR1A, BRCA1, BRCA2, BRIP1, CDH1, CDKN2A (p14ARF), CDKN2A (p16INK4a), CKD4, CHEK2, CTNNA1, DICER1, EPCAM (Deletion/duplication testing only), GREM1 (promoter region deletion/duplication testing only), KIT, MEN1, MLH1, MSH2, MSH3, MSH6, MU  . GERD (gastroesophageal reflux disease)   . Hiatal hernia 02/02/2015   small, noted on CT of chest   . History of radiation therapy 04/16/18- 05/14/18   42.56 Gy directed to the Left Breast in 16 fractions followed by a boost of 8 Gy in 4 fractions  .  History of smoking 25-50 pack years 06/09/2015  . Malignant neoplasm of lower-inner quadrant of left breast in female, estrogen receptor positive ([place]) 03/06/2018  . Osteopenia 02/18/2018   DEXA 02/2018, T-1.8 R femoral neck  . Personal history of radiation therapy 2019  . Pleuritic pain 10/01/2018  . Shingles 1/09, 07/2010, 11/2017  . Unspecified vitamin D deficiency 02/2011 and 2016  . Vitamin D deficiency 02/14/2011     Family History  Problem Relation Age of Onset  . Stroke Mother   . Diabetes Mother   . Stroke Sister 76       brainstem stroke  . Diabetes Sister   . Seizures Sister   . Lung cancer Father 79  . Ovarian cancer Sister 7       metastatic to lung, liver  . Cancer Sister   . Ovarian cancer Maternal Aunt 95  . Heart disease Maternal Grandmother   . Heart disease Paternal Grandmother   . Prostate cancer Cousin   . Lung cancer Cousin        recurrent, now stage 4  . Breast cancer Neg Hx      Past Surgical History:  Procedure Laterality Date  . BREAST EXCISIONAL BIOPSY Right    benign years ago  . BREAST LUMPECTOMY Left 2019  . BREAST LUMPECTOMY WITH RADIOACTIVE SEED AND SENTINEL LYMPH NODE BIOPSY Left 03/19/2018   Procedure: BREAST LUMPECTOMY WITH RADIOACTIVE SEED AND SENTINEL LYMPH NODE BIOPSY;  Surgeon: Stark Klein, MD;  Location: [facility];  Service: General;  Laterality: Left;  . CATARACT EXTRACTION, BILATERAL Bilateral 03/2017, 04/2017   [facility], Dr. Alanda Slim  . SHOULDER SURGERY  03/2011   for frozen shoulder, and cyst removal (Dr. Percell Miller)  . TONSILLECTOMY  child  . VIDEO BRONCHOSCOPY Bilateral 06/21/2016   Procedure: VIDEO BRONCHOSCOPY WITHOUT FLUORO;  Surgeon: Brand Males, MD;  Location: [facility];  Service: Cardiopulmonary;  Laterality: Bilateral;    Social History   Socioeconomic History  . Marital status: Widowed    Spouse name: Not on file  . Number of children: Not on file  . Years of education: Not on file  . Highest  education level: Not on file  Occupational History  . Occupation: [employer]  Tobacco Use  . Smoking status: Former Smoker    Packs/day: 2.00    Years: 24.00    Pack years: 48.00    Types: Cigarettes    Quit date: 06/28/1980    Years since quitting: 40.3  . Smokeless tobacco: Never Used  . Tobacco comment: smoked 1-2 PPD x 24 years;  Vaping Use  . Vaping Use: Never used  Substance and Sexual Activity  . Alcohol use: Yes    Alcohol/week: 0.0 standard drinks    Comment: 1 glass of wine once a week or less  . Drug use: No  . Sexual activity: Not Currently  Other Topics Concern  . Not on file  Social History Narrative   Lives alone.  Widowed (2011).  Son and granddaughter live in [place] (sister) has been moved to SNF.   Also has step grandson.   She works part-time at the [employer] in [place].  Decreased to 1-2 days/week.   Social Determinants of Health   Financial Resource Strain:   . Difficulty of Paying Living Expenses: Not on file  Food Insecurity:   . Worried About Charity fundraiser in the Last Year: Not on file  . Ran Out of Food in the Last Year: Not on file  Transportation Needs:   . Lack of Transportation (Medical): Not on file  . Lack of Transportation (Non-Medical): Not on file  Physical Activity:   . Days of Exercise per Week: Not on file  . Minutes of Exercise per Session: Not on file  Stress:   . Feeling of Stress : Not on file  Social Connections:   . Frequency of Communication with Friends and Family: Not on file  . Frequency of Social Gatherings with Friends and Family: Not on file  . Attends Religious Services: Not on file  . Active Member of Clubs or Organizations: Not on file  . Attends Archivist Meetings: Not on file  . Marital Status: Not on file  Intimate Partner Violence:   . Fear of Current or Ex-Partner: Not on file  . Emotionally Abused: Not on file  . Physically Abused: Not on file  .  Sexually Abused: Not on file     No Known Allergies   Immunization History  Administered Date(s) Administered  . Fluad Quad(high Dose 65+) 08/25/2019, 09/07/2020  . Influenza, High Dose Seasonal PF 01/26/2014, 10/19/2014, 08/30/2015, 08/08/2016, 08/05/2017, 09/02/2018  . Moderna SARS-COVID-2 Vaccination 12/29/2019, 01/26/2020  . Pneumococcal Conjugate-13 12/25/2015  . Pneumococcal Polysaccharide-23 12/30/2016  . Tdap 09/03/2017    Outpatient Medications Prior to Visit  Medication Sig Dispense Refill  . alendronate (FOSAMAX) 70 MG tablet Take 1 tablet (70 mg total) by mouth every 7 (seven) days. Take  with a full glass of water on an empty stomach. 4 tablet 11  . Ascorbic Acid (VITAMIN C) 100 MG tablet Take 1 tablet (100 mg total) by mouth daily.    Marland Kitchen aspirin EC 81 MG tablet Take 81 mg by mouth daily.    Marland Kitchen atorvastatin (LIPITOR) 20 MG tablet Take 1 tablet (20 mg total) by mouth daily. 90 tablet 3  . Calcium-Magnesium-Vitamin D (CALCIUM 500 PO) Take 1 tablet by mouth daily.    . cholecalciferol (VITAMIN D3) 25 MCG (1000 UNIT) tablet Take 1,000 Units by mouth daily.    . fluticasone furoate-vilanterol (BREO ELLIPTA) 100-25 MCG/INH AEPB INHALE 1 PUFF BY MOUTH EVERY DAY 14 each 0  . ipratropium-albuterol (DUONEB) 0.5-2.5 (3) MG/3ML SOLN Take 3 mLs by nebulization every 4 (four) hours as needed. (Patient not taking: Reported on 03/02/2020) 360 mL 2  . letrozole (FEMARA) 2.5 MG tablet TAKE 1 TABLET BY MOUTH EVERY DAY 90 tablet 0  . Multiple Vitamins-Minerals (MULTIVITAMIN WITH MINERALS) tablet Take 1 tablet by mouth daily.    Marland Kitchen omeprazole (PRILOSEC OTC) 20 MG tablet Take 20 mg by mouth daily. (Patient not taking: Reported on 09/07/2020)    . sodium chloride HYPERTONIC 3 % nebulizer solution Take by nebulization as needed for other. (Patient not taking: Reported on 03/02/2020) 750 mL 2  . SPIRIVA RESPIMAT 2.5 MCG/ACT AERS INHALE 2 PUFFS BY MOUTH TWICE A DAY 4 g 3   No facility-administered  medications prior to visit.    ROS   Objective:  There were no vitals filed for this visit.   on *** LPM *** RA BMI Readings from Last 3 Encounters:  09/07/20 22.90 kg/m  08/21/20 20.67 kg/m  04/14/20 22.75 kg/m   Wt Readings from Last 3 Encounters:  09/07/20 150 lb 9.6 oz (68.3 kg)  08/21/20 140 lb (63.5 kg)  04/14/20 149 lb 9.6 oz (67.9 kg)    Physical Exam   CBC    Component Value Date/Time   WBC 7.9 08/21/2020 1109   RBC 4.29 08/21/2020 1109   HGB 12.6 08/21/2020 1109   HGB 12.1 08/19/2019 0857   HCT 39.7 08/21/2020 1109   HCT 37.2 08/19/2019 0857   PLT 280 08/21/2020 1109   PLT 333 08/19/2019 0857   MCV 92.5 08/21/2020 1109   MCV 90 08/19/2019 0857   MCH 29.4 08/21/2020 1109   MCHC 31.7 08/21/2020 1109   RDW 13.7 08/21/2020 1109   RDW 11.9 08/19/2019 0857   LYMPHSABS 1.3 08/21/2020 1109   LYMPHSABS 1.5 08/19/2019 0857   MONOABS 0.8 08/21/2020 1109   EOSABS 0.1 08/21/2020 1109   EOSABS 0.1 08/19/2019 0857   BASOSABS 0.1 08/21/2020 1109   BASOSABS 0.1 08/19/2019 0857    CHEMISTRY No results for input(s): NA, K, CL, CO2, GLUCOSE, BUN, CREATININE, CALCIUM, MG, PHOS in the last 168 hours. CrCl cannot be calculated (Patient's most recent lab result is older than the maximum 21 days allowed.).   Chest Imaging- films reviewed: CXR, 2 view 08/21/2020-hyperinflation, bronchiectasis.  No focal opacities.  CT chest 12/18/2018-multilobar bronchiectasis, tree-in-bud nodules in bilateral upper lobes, lingula, right middle lobe, right lower lobe.  Patchy scattered nodules.  Patulous esophagus.  Pulmonary Functions Testing Results: PFT Results Latest Ref Rng & Units 10/30/2015  FVC-Pre L 2.13  FVC-Predicted Pre % 61  FVC-Post L 2.26  FVC-Predicted Post % 64  Pre FEV1/FVC % % 60  Post FEV1/FCV % % 60  FEV1-Pre L 1.27  FEV1-Predicted Pre % 48  FEV1-Post L  1.36  DLCO uncorrected ml/min/mmHg 16.04  DLCO UNC% % 51  DLVA Predicted % 83  TLC L 5.24  TLC %  Predicted % 90  RV % Predicted % 118   2016-very severe obstruction without significant bronchodilator reversibility.  No restriction or hyperinflation.  Diffusion capacity moderately reduced.     Assessment & Plan:     ICD-10-CM   1. COPD, moderate ([place])  J44.9   2. Bronchiectasis without complication (H[facility])  X83.3     COPD      Current Outpatient Medications:  .  alendronate (FOSAMAX) 70 MG tablet, Take 1 tablet (70 mg total) by mouth every 7 (seven) days. Take with a full glass of water on an empty stomach., Disp: 4 tablet, Rfl: 11 .  Ascorbic Acid (VITAMIN C) 100 MG tablet, Take 1 tablet (100 mg total) by mouth daily., Disp: , Rfl:  .  aspirin EC 81 MG tablet, Take 81 mg by mouth daily., Disp: , Rfl:  .  atorvastatin (LIPITOR) 20 MG tablet, Take 1 tablet (20 mg total) by mouth daily., Disp: 90 tablet, Rfl: 3 .  Calcium-Magnesium-Vitamin D (CALCIUM 500 PO), Take 1 tablet by mouth daily., Disp: , Rfl:  .  cholecalciferol (VITAMIN D3) 25 MCG (1000 UNIT) tablet, Take 1,000 Units by mouth daily., Disp: , Rfl:  .  fluticasone furoate-vilanterol (BREO ELLIPTA) 100-25 MCG/INH AEPB, INHALE 1 PUFF BY MOUTH EVERY DAY, Disp: 14 each, Rfl: 0 .  ipratropium-albuterol (DUONEB) 0.5-2.5 (3) MG/3ML SOLN, Take 3 mLs by nebulization every 4 (four) hours as needed. (Patient not taking: Reported on 03/02/2020), Disp: 360 mL, Rfl: 2 .  letrozole (FEMARA) 2.5 MG tablet, TAKE 1 TABLET BY MOUTH EVERY DAY, Disp: 90 tablet, Rfl: 0 .  Multiple Vitamins-Minerals (MULTIVITAMIN WITH MINERALS) tablet, Take 1 tablet by mouth daily., Disp: , Rfl:  .  omeprazole (PRILOSEC OTC) 20 MG tablet, Take 20 mg by mouth daily. (Patient not taking: Reported on 09/07/2020), Disp: , Rfl:  .  sodium chloride HYPERTONIC 3 % nebulizer solution, Take by nebulization as needed for other. (Patient not taking: Reported on 03/02/2020), Disp: 750 mL, Rfl: 2 .  SPIRIVA RESPIMAT 2.5 MCG/ACT AERS, INHALE 2 PUFFS BY MOUTH TWICE A DAY, Disp: 4 g,  Rfl: 3   I spent *** minutes on this encounter, including face to face time and non-face to face time spent reviewing records, charting, coordinating care, etc.   Julian Hy, DO [facility] 10/20/2020 7:34 AM

## 2020-10-26 ENCOUNTER — Inpatient Hospital Stay: Payer: Medicare Other | Admitting: Hematology and Oncology

## 2020-12-10 ENCOUNTER — Other Ambulatory Visit: Payer: Self-pay | Admitting: Hematology and Oncology

## 2020-12-11 ENCOUNTER — Telehealth: Payer: Self-pay | Admitting: Hematology and Oncology

## 2020-12-11 NOTE — Telephone Encounter (Signed)
Left message with follow-up appointment per 12/27 schedule message. Gave option to call back to reschedule.

## 2020-12-18 ENCOUNTER — Ambulatory Visit (INDEPENDENT_AMBULATORY_CARE_PROVIDER_SITE_OTHER): Payer: Medicare Other | Admitting: Family Medicine

## 2020-12-18 ENCOUNTER — Encounter: Payer: Self-pay | Admitting: Family Medicine

## 2020-12-18 ENCOUNTER — Other Ambulatory Visit: Payer: Self-pay

## 2020-12-18 VITALS — BP 110/70 | HR 78 | Temp 98.0°F | Wt 148.2 lb

## 2020-12-18 DIAGNOSIS — B029 Zoster without complications: Secondary | ICD-10-CM

## 2020-12-18 MED ORDER — VALACYCLOVIR HCL 1 G PO TABS: 1000.0000 mg | ORAL_TABLET | Freq: Three times a day (TID) | ORAL | 0 refills | Status: DC

## 2020-12-18 NOTE — Patient Instructions (Signed)
Take the Valtrex as prescribed.   For pain, you can take Tylenol 1,000 mg twice daily or 2 Aleve twice daily with food.   After this clears up, I recommend that you get your Shingrix vaccines.     Shingles  Shingles, which is also known as herpes zoster, is an infection that causes a painful skin rash and fluid-filled blisters. It is caused by a virus. Shingles only develops in people who:  Have had chickenpox.  Have been given a medicine to protect against chickenpox (have been vaccinated). Shingles is rare in this group. What are the causes? Shingles is caused by varicella-zoster virus (VZV). This is the same virus that causes chickenpox. After a person is exposed to VZV, the virus stays in the body in an inactive (dormant) state. Shingles develops if the virus is reactivated. This can happen many years after the first (initial) exposure to VZV. It is not known what causes this virus to be reactivated. What increases the risk? People who have had chickenpox or received the chickenpox vaccine are at risk for shingles. Shingles infection is more common in people who:  Are older than age 59.  Have a weakened disease-fighting system (immune system), such as people with: ? HIV. ? AIDS. ? Cancer.  Are taking medicines that weaken the immune system, such as transplant medicines.  Are experiencing a lot of stress. What are the signs or symptoms? Early symptoms of this condition include itching, tingling, and pain in an area on your skin. Pain may be described as burning, stabbing, or throbbing. A few days or weeks after early symptoms start, a painful red rash appears. The rash is usually on one side of the body and has a band-like or belt-like pattern. The rash eventually turns into fluid-filled blisters that break open, change into scabs, and dry up in about 2-3 weeks. At any time during the infection, you may also develop:  A fever.  Chills.  A headache.  An upset stomach. How  is this diagnosed? This condition is diagnosed with a skin exam. Skin or fluid samples may be taken from the blisters before a diagnosis is made. These samples are examined under a microscope or sent to a lab for testing. How is this treated? The rash may last for several weeks. There is not a specific cure for this condition. Your health care provider will probably prescribe medicines to help you manage pain, recover more quickly, and avoid long-term problems. Medicines may include:  Antiviral drugs.  Anti-inflammatory drugs.  Pain medicines.  Anti-itching medicines (antihistamines). If the area involved is on your face, you may be referred to a specialist, such as an eye doctor (ophthalmologist) or an ear, nose, and throat (ENT) doctor (otolaryngologist) to help you avoid eye problems, chronic pain, or disability. Follow these instructions at home: Medicines  Take over-the-counter and prescription medicines only as told by your health care provider.  Apply an anti-itch cream or numbing cream to the affected area as told by your health care provider. Relieving itching and discomfort   Apply cold, wet cloths (cold compresses) to the area of the rash or blisters as told by your health care provider.  Cool baths can be soothing. Try adding baking soda or dry oatmeal to the water to reduce itching. Do not bathe in hot water. Blister and rash care  Keep your rash covered with a loose bandage (dressing). Wear loose-fitting clothing to help ease the pain of material rubbing against the rash.  Keep  your rash and blisters clean by washing the area with mild soap and cool water as told by your health care provider.  Check your rash every day for signs of infection. Check for: ? More redness, swelling, or pain. ? Fluid or blood. ? Warmth. ? Pus or a bad smell.  Do not scratch your rash or pick at your blisters. To help avoid scratching: ? Keep your fingernails clean and cut short. ? Wear  gloves or mittens while you sleep, if scratching is a problem. General instructions  Rest as told by your health care provider.  Keep all follow-up visits as told by your health care provider. This is important.  Wash your hands often with soap and water. If soap and water are not available, use hand sanitizer. Doing this lowers your chance of getting a bacterial skin infection.  Before your blisters change into scabs, your shingles infection can cause chickenpox in people who have never had it or have never been vaccinated against it. To prevent this from happening, avoid contact with other people, especially: ? Babies. ? Pregnant women. ? Children who have eczema. ? Elderly people who have transplants. ? People who have chronic illnesses, such as cancer or AIDS. Contact a health care provider if:  Your pain is not relieved with prescribed medicines.  Your pain does not get better after the rash heals.  You have signs of infection in the rash area, such as: ? More redness, swelling, or pain around the rash. ? Fluid or blood coming from the rash. ? The rash area feeling warm to the touch. ? Pus or a bad smell coming from the rash. Get help right away if:  The rash is on your face or nose.  You have facial pain, pain around your eye area, or loss of feeling on one side of your face.  You have difficulty seeing.  You have ear pain or have ringing in your ear.  You have a loss of taste.  Your condition gets worse. Summary  Shingles, which is also known as herpes zoster, is an infection that causes a painful skin rash and fluid-filled blisters.  This condition is diagnosed with a skin exam. Skin or fluid samples may be taken from the blisters and examined before the diagnosis is made.  Keep your rash covered with a loose bandage (dressing). Wear loose-fitting clothing to help ease the pain of material rubbing against the rash.  Before your blisters change into scabs, your  shingles infection can cause chickenpox in people who have never had it or have never been vaccinated against it. This information is not intended to replace advice given to you by your health care provider. Make sure you discuss any questions you have with your health care provider. Document Revised: 03/26/2019 Document Reviewed: 08/06/2017 Elsevier Patient Education  2020 [employer].

## 2020-12-18 NOTE — Progress Notes (Signed)
   Subjective:    Patient ID: Molly Ross, female    DOB: 01-27-1941, 80 y.o.   MRN: [id number]  HPI Chief Complaint  Patient presents with  . possible shingles    Possible shingles on left bottom, itching, burning. Had shingles before   Complains of a 24 hour history of itching and burning on her left buttock and has developed a rash.  Reports having a history of shingles in that area.  States she has not had to take pain medication. She has not received her Shingrix vaccine.  States her PCP keeps reminding her to do this.  Denies fever, chills, body aches, abdominal pain, nausea, vomiting or diarrhea.  Reviewed allergies, medications, past medical, surgical, family, and social history.     Review of Systems Pertinent positives and negatives in the history of present illness.     Objective:   Physical Exam BP 110/70   Pulse 78   Temp 98 F (36.7 C)   Wt 148 lb 3.2 oz (67.2 kg)   BMI 22.53 kg/m   She has a 2 to 3 cm circular area on her left buttock with vesicles and erythematous base.  No sign of infection.      Assessment & Plan:  Herpes zoster without complication - Plan: valACYclovir (VALTREX) 1000 MG tablet  Reviewed her chart and apparently in April she had shingles rash and was treated with valacyclovir. I will treat her for shingles today. Discussed pain management as needed. Follow up if worsening. I recommend she get her shingles vaccines when she recovers completely.

## 2021-01-01 ENCOUNTER — Other Ambulatory Visit: Payer: Self-pay | Admitting: Internal Medicine

## 2021-01-31 DIAGNOSIS — M545 Low back pain, unspecified: Secondary | ICD-10-CM | POA: Diagnosis not present

## 2021-01-31 DIAGNOSIS — M25562 Pain in left knee: Secondary | ICD-10-CM | POA: Diagnosis not present

## 2021-02-09 DIAGNOSIS — K635 Polyp of colon: Secondary | ICD-10-CM | POA: Insufficient documentation

## 2021-02-09 DIAGNOSIS — R0600 Dyspnea, unspecified: Secondary | ICD-10-CM | POA: Insufficient documentation

## 2021-02-09 DIAGNOSIS — I2510 Atherosclerotic heart disease of native coronary artery without angina pectoris: Secondary | ICD-10-CM | POA: Insufficient documentation

## 2021-02-09 DIAGNOSIS — C801 Malignant (primary) neoplasm, unspecified: Secondary | ICD-10-CM | POA: Insufficient documentation

## 2021-02-09 DIAGNOSIS — K219 Gastro-esophageal reflux disease without esophagitis: Secondary | ICD-10-CM | POA: Insufficient documentation

## 2021-02-09 DIAGNOSIS — B029 Zoster without complications: Secondary | ICD-10-CM | POA: Insufficient documentation

## 2021-02-09 DIAGNOSIS — Z923 Personal history of irradiation: Secondary | ICD-10-CM | POA: Insufficient documentation

## 2021-02-09 DIAGNOSIS — J479 Bronchiectasis, uncomplicated: Secondary | ICD-10-CM | POA: Insufficient documentation

## 2021-02-12 ENCOUNTER — Encounter: Payer: Self-pay | Admitting: Cardiology

## 2021-02-12 ENCOUNTER — Other Ambulatory Visit: Payer: Self-pay

## 2021-02-12 ENCOUNTER — Ambulatory Visit: Payer: Medicare Other | Admitting: Cardiology

## 2021-02-12 VITALS — BP 138/60 | HR 88 | Ht 68.0 in | Wt 146.0 lb

## 2021-02-12 DIAGNOSIS — I209 Angina pectoris, unspecified: Secondary | ICD-10-CM | POA: Diagnosis not present

## 2021-02-12 DIAGNOSIS — I2510 Atherosclerotic heart disease of native coronary artery without angina pectoris: Secondary | ICD-10-CM

## 2021-02-12 DIAGNOSIS — E785 Hyperlipidemia, unspecified: Secondary | ICD-10-CM | POA: Diagnosis not present

## 2021-02-12 DIAGNOSIS — J449 Chronic obstructive pulmonary disease, unspecified: Secondary | ICD-10-CM

## 2021-02-12 MED ORDER — DILTIAZEM HCL ER COATED BEADS 180 MG PO CP24: 180.0000 mg | ORAL_CAPSULE | Freq: Every day | ORAL | 3 refills | Status: DC

## 2021-02-12 MED ORDER — NITROGLYCERIN 0.4 MG SL SUBL: 0.4000 mg | SUBLINGUAL_TABLET | SUBLINGUAL | 6 refills | Status: DC | PRN

## 2021-02-12 NOTE — Progress Notes (Signed)
Cardiology Office Note:    Date:  02/12/2021   ID:  Molly, Ross 04-20-1941, MRN [id number]  PCP:  Rita Ohara, MD  Cardiologist:  Shirlee More, MD    Referring MD: Rita Ohara, MD    ASSESSMENT:    1. Angina pectoris ([place])   2. Coronary artery calcification seen on CT scan   3. Chronic obstructive pulmonary disease, unspecified COPD type ([place])   4. Dyslipidemia    PLAN:    In order of problems listed above:  1. She has a pattern of angina I will refill her prescription for nitroglycerin continue aspirin statin and calcium channel blockers anticoagulant therapy and if symptoms were to progress would be best served by direct referral to coronary angiography at this time I would not advise stress testing. 2. Followed by pulmonary her symptoms have progressed continue her current bronchodilators 3. Lipids are ideal continue her statin   Next appointment: 6 months   Medication Adjustments/Labs and Tests Ordered: Current medicines are reviewed at length with the patient today.  Concerns regarding medicines are outlined above.  Orders Placed This Encounter  Procedures  . EKG 12-Lead   Meds ordered this encounter  Medications  . nitroGLYCERIN (NITROSTAT) 0.4 MG SL tablet    Sig: Place 1 tablet (0.4 mg total) under the tongue every 5 (five) minutes as needed.    Dispense:  25 tablet    Refill:  6  . diltiazem (CARDIZEM CD) 180 MG 24 hr capsule    Sig: Take 1 capsule (180 mg total) by mouth daily.    Dispense:  90 capsule    Refill:  3    Chief Complaint  Patient presents with  . Follow-up    She has coronary artery calcification/atherosclerosis with a normal ischemia study in 2016.    History of Present Illness:    Molly Ross is a 80 y.o. female with a hx of coronary artery calcification on CT scan hyperlipidemia and COPD with bronchiectasis and breast cancer with radiation therapy last seen 02/09/2019.  She was also noted to have coronary artery  calcification in 2016 she had a myocardial perfusion study showing no ischemia EF 76%.  She declined a repeat ischemia evaluation when last seen. Compliance with diet, lifestyle and medications: Yes  Previously she had a prescription for nitroglycerin and several times a year she will get aching in the chest radiates to the jaw and relieved with rest typical angina she has had perhaps 3 episodes in the last year.  She request a prescription for nitroglycerin.  Her symptoms are typical angina she is at high risk she is problematic for noninvasive testing with her COPD and I told her if the pattern was unstable I favor direct referral to coronary angiography.  Otherwise medical therapy she is on aspirin statin and put on a rate limiting calcium channel blocker rhythm lung disease renew her prescription for nitroglycerin at this time hold on referral for angiography.  Clinically she has chronic stable angina. Her shortness of breath from bronchiectasis with COPD is a little bit worsened and she is due for pulmonary follow-up.  No edema orthopnea palpitation or syncope. Most recent labs are 09/07/2020 lipids at target LDL 65 cholesterol 164 triglycerides 45 HDL 89, creatinine normal 0.54 and TSH normal 1.23 Past Medical History:  Diagnosis Date  . Atherosclerosis of aorta ([place]) 02/02/2015   Noted on imaging   . Bronchiectasis ([place])   . Bronchiectasis with (acute) exacerbation ([place]) 06/09/2015  .  CAD (coronary artery disease), native coronary artery    Coronary calcification noted on CT scan 2016  Negative treadmill myoview 07/20/15 with EF 76%   . Cancer ([place])   . Cellulitis of left breast 10/01/2018  . Colon polyp   . COPD, moderate ([place]) 11/06/2015  . Coronary artery calcification seen on CT scan    Coronary calcification noted on CT scan 2016  Negative treadmill myoview 07/20/15 with EF 76%   . Depressive disorder, not elsewhere classified 12/04/2011  . Dyspnea   . Emphysema of lung ([place]) 02/02/2015   . Family history of lung cancer   . Family history of ovarian cancer   . Family history of prostate cancer   . Genetic testing 03/27/2018   The Common Hereditary Cancer Panel offered by [facility] includes sequencing and/or deletion duplication testing of the following 47 genes: APC, ATM, AXIN2, BARD1, BMPR1A, BRCA1, BRCA2, BRIP1, CDH1, CDKN2A (p14ARF), CDKN2A (p16INK4a), CKD4, CHEK2, CTNNA1, DICER1, EPCAM (Deletion/duplication testing only), GREM1 (promoter region deletion/duplication testing only), KIT, MEN1, MLH1, MSH2, MSH3, MSH6, MU  . GERD (gastroesophageal reflux disease)   . Hiatal hernia 02/02/2015   small, noted on CT of chest   . History of radiation therapy 04/16/18- 05/14/18   42.56 Gy directed to the Left Breast in 16 fractions followed by a boost of 8 Gy in 4 fractions  . History of smoking 25-50 pack years 06/09/2015  . Malignant neoplasm of lower-inner quadrant of left breast in female, estrogen receptor positive ([place]) 03/06/2018  . Osteopenia 02/18/2018   DEXA 02/2018, T-1.8 R femoral neck  . Personal history of radiation therapy 2019  . Pleuritic pain 10/01/2018  . Shingles 1/09, 07/2010, 11/2017  . Unspecified vitamin D deficiency 02/2011 and 2016  . Vitamin D deficiency 02/14/2011    Past Surgical History:  Procedure Laterality Date  . BREAST EXCISIONAL BIOPSY Right    benign years ago  . BREAST LUMPECTOMY Left 2019  . BREAST LUMPECTOMY WITH RADIOACTIVE SEED AND SENTINEL LYMPH NODE BIOPSY Left 03/19/2018   Procedure: BREAST LUMPECTOMY WITH RADIOACTIVE SEED AND SENTINEL LYMPH NODE BIOPSY;  Surgeon: Stark Klein, MD;  Location: [facility];  Service: General;  Laterality: Left;  . CATARACT EXTRACTION, BILATERAL Bilateral 03/2017, 04/2017   [facility], Dr. Alanda Slim  . SHOULDER SURGERY  03/2011   for frozen shoulder, and cyst removal (Dr. Percell Miller)  . TONSILLECTOMY  child  . VIDEO BRONCHOSCOPY Bilateral 06/21/2016   Procedure: VIDEO BRONCHOSCOPY WITHOUT FLUORO;  Surgeon:  Brand Males, MD;  Location: [facility];  Service: Cardiopulmonary;  Laterality: Bilateral;    Current Medications: Current Meds  Medication Sig  . alendronate (FOSAMAX) 70 MG tablet Take 1 tablet (70 mg total) by mouth every 7 (seven) days. Take with a full glass of water on an empty stomach.  . Ascorbic Acid (VITAMIN C) 100 MG tablet Take 1 tablet (100 mg total) by mouth daily.  Marland Kitchen aspirin EC 81 MG tablet Take 81 mg by mouth daily.  Marland Kitchen atorvastatin (LIPITOR) 20 MG tablet Take 1 tablet (20 mg total) by mouth daily.  . Calcium-Magnesium-Vitamin D (CALCIUM 500 PO) Take 1 tablet by mouth daily.  . cholecalciferol (VITAMIN D3) 25 MCG (1000 UNIT) tablet Take 1,000 Units by mouth daily.  Marland Kitchen diltiazem (CARDIZEM CD) 180 MG 24 hr capsule Take 1 capsule (180 mg total) by mouth daily.  . fluticasone furoate-vilanterol (BREO ELLIPTA) 100-25 MCG/INH AEPB INHALE 1 PUFF BY MOUTH EVERY DAY  . ipratropium-albuterol (DUONEB) 0.5-2.5 (3) MG/3ML SOLN Take  3 mLs by nebulization every 4 (four) hours as needed.  Marland Kitchen letrozole (FEMARA) 2.5 MG tablet TAKE 1 TABLET BY MOUTH EVERY DAY  . Multiple Vitamins-Minerals (MULTIVITAMIN WITH MINERALS) tablet Take 1 tablet by mouth daily.  . nitroGLYCERIN (NITROSTAT) 0.4 MG SL tablet Place 1 tablet (0.4 mg total) under the tongue every 5 (five) minutes as needed.  Marland Kitchen omeprazole (PRILOSEC OTC) 20 MG tablet Take 20 mg by mouth daily.  Marland Kitchen SPIRIVA RESPIMAT 2.5 MCG/ACT AERS INHALE 2 PUFFS BY MOUTH TWICE A DAY     Allergies:   Patient has no known allergies.   Social History   Socioeconomic History  . Marital status: Widowed    Spouse name: Not on file  . Number of children: Not on file  . Years of education: Not on file  . Highest education level: Not on file  Occupational History  . Occupation: [employer]  Tobacco Use  . Smoking status: Former Smoker    Packs/day: 2.00    Years: 24.00    Pack years: 48.00    Types: Cigarettes    Quit date: 06/28/1980     Years since quitting: 40.6  . Smokeless tobacco: Never Used  . Tobacco comment: smoked 1-2 PPD x 24 years;  Vaping Use  . Vaping Use: Never used  Substance and Sexual Activity  . Alcohol use: Yes    Alcohol/week: 0.0 standard drinks    Comment: 1 glass of wine once a week or less  . Drug use: No  . Sexual activity: Not Currently  Other Topics Concern  . Not on file  Social History Narrative   Lives alone.  Widowed (2011).  Son and granddaughter live in [place] (sister) has been moved to SNF.   Also has step grandson.   She works part-time at the [employer] in [place].  Decreased to 1-2 days/week.   Social Determinants of Health   Financial Resource Strain: Not on file  Food Insecurity: Not on file  Transportation Needs: Not on file  Physical Activity: Not on file  Stress: Not on file  Social Connections: Not on file     Family History: The patient's family history includes Cancer in her sister; Diabetes in her mother and sister; Heart disease in her maternal grandmother and paternal grandmother; Lung cancer in her cousin; Lung cancer (age of onset: 3) in her father; Ovarian cancer (age of onset: 69) in her sister; Ovarian cancer (age of onset: 26) in her maternal aunt; Prostate cancer in her cousin; Seizures in her sister; Stroke in her mother; Stroke (age of onset: 42) in her sister. There is no history of Breast cancer. ROS:   Please see the history of present illness.    All other systems reviewed and are negative.  EKGs/Labs/Other Studies Reviewed:    The following studies were reviewed today:  EKG:  EKG ordered today and personally reviewed.  The ekg ordered today demonstrates sinus rhythm normal  Recent Labs: 08/21/2020: Hemoglobin 12.6; Platelets 280 09/07/2020: ALT 10; BUN 12; Creatinine, Ser 0.54; Potassium 4.5; Sodium 142  Recent Lipid Panel    Component Value Date/Time   CHOL 164 09/07/2020 1105   TRIG 45 09/07/2020 1105   HDL 89  09/07/2020 1105   CHOLHDL 1.8 09/07/2020 1105   CHOLHDL 1.7 09/01/2017 1307   VLDL 9 12/30/2016 0955   LDLCALC 65 09/07/2020 1105   LDLCALC 51 09/01/2017 1307    Physical Exam:    VS:  BP  138/60   Pulse 88   Ht _0  (1.727 m)   Wt 146 lb (66.2 kg)   SpO2 95%   BMI 22.20 kg/m     Wt Readings from Last 3 Encounters:  02/12/21 146 lb (66.2 kg)  12/18/20 148 lb 3.2 oz (67.2 kg)  09/07/20 150 lb 9.6 oz (68.3 kg)     GEN: COPD habitus well nourished, well developed in no acute distress HEENT: Normal NECK: No JVD; No carotid bruits LYMPHATICS: No lymphadenopathy CARDIAC: Distant heart sounds RRR, no murmurs, rubs, gallops RESPIRATORY:  Clear to auscultation without rales, wheezing or rhonchi  ABDOMEN: Soft, non-tender, non-distended MUSCULOSKELETAL:  No edema; No deformity  SKIN: Warm and dry NEUROLOGIC:  Alert and oriented x 3 PSYCHIATRIC:  Normal affect    Signed, Shirlee More, MD  02/12/2021 4:43 PM    [facility]

## 2021-02-12 NOTE — Patient Instructions (Addendum)
Medication Instructions:  Your physician has recommended you make the following change in your medication:   Use Nitroglycerin as needed for chest pain. Start Cardizem CD 180 mg daily.  *If you need a refill on your cardiac medications before your next appointment, please call your pharmacy*   Lab Work: None ordered If you have labs (blood work) drawn today and your tests are completely normal, you will receive your results only by: Marland Kitchen MyChart Message (if you have MyChart) OR . A paper copy in the mail If you have any lab test that is abnormal or we need to change your treatment, we will call you to review the results.   Testing/Procedures: None ordered   Follow-Up: At [facility], you and your health needs are our priority.  As part of our continuing mission to provide you with exceptional heart care, we have created designated Provider Care Teams.  These Care Teams include your primary Cardiologist (physician) and Advanced Practice Providers (APPs -  Physician Assistants and Nurse Practitioners) who all work together to provide you with the care you need, when you need it.  We recommend signing up for the patient portal called "MyChart".  Sign up information is provided on this After Visit Summary.  MyChart is used to connect with patients for Virtual Visits (Telemedicine).  Patients are able to view lab/test results, encounter notes, upcoming appointments, etc.  Non-urgent messages can be sent to your provider as well.   To learn more about what you can do with MyChart, go to [email or web address].    Your next appointment:   6 month(s)  The format for your next appointment:   In Person  Provider:   Shirlee More, MD   Other Instructions Nitroglycerin sublingual tablets What is this medicine? NITROGLYCERIN (nye troe GLI ser in) is a type of vasodilator. It relaxes blood vessels, increasing the blood and oxygen supply to your heart. This medicine is used to relieve chest  pain caused by angina. It is also used to prevent chest pain before activities like climbing stairs, going outdoors in cold weather, or sexual activity. This medicine may be used for other purposes; ask your health care provider or pharmacist if you have questions. COMMON BRAND NAME(S): Nitroquick, Nitrostat, Nitrotab What should I tell my health care provider before I take this medicine? They need to know if you have any of these conditions:  anemia  head injury, recent stroke, or bleeding in the brain  liver disease  previous heart attack  an unusual or allergic reaction to nitroglycerin, other medicines, foods, dyes, or preservatives  pregnant or trying to get pregnant  breast-feeding How should I use this medicine? Take this medicine by mouth as needed. Use at the first sign of an angina attack (chest pain or tightness). You can also take this medicine 5 to 10 minutes before an event likely to produce chest pain. Follow the directions exactly as written on the prescription label. Place one tablet under your tongue and let it dissolve. Do not swallow whole. Replace the dose if you accidentally swallow it. It will help if your mouth is not dry. Saliva around the tablet will help it to dissolve more quickly. Do not eat or drink, smoke or chew tobacco while a tablet is dissolving. Sit down when taking this medicine. In an angina attack, you should feel better within 5 minutes after your first dose. You can take a dose every 5 minutes up to a total of 3 doses. If you do  not feel better or feel worse after 1 dose, call 9-1-1 at once. Do not take more than 3 doses in 15 minutes. Your health care provider might give you other directions. Follow those directions if he or she does. Do not take your medicine more often than directed. Talk to your health care provider about the use of this medicine in children. Special care may be needed. Overdosage: If you think you have taken too much of this  medicine contact a poison control center or emergency room at once. NOTE: This medicine is only for you. Do not share this medicine with others. What if I miss a dose? This does not apply. This medicine is only used as needed. What may interact with this medicine? Do not take this medicine with any of the following medications:  certain migraine medicines like ergotamine and dihydroergotamine (DHE)  medicines used to treat erectile dysfunction like sildenafil, tadalafil, and vardenafil  riociguat This medicine may also interact with the following medications:  alteplase  aspirin  heparin  medicines for high blood pressure  medicines for mental depression  other medicines used to treat angina  phenothiazines like chlorpromazine, mesoridazine, prochlorperazine, thioridazine This list may not describe all possible interactions. Give your health care provider a list of all the medicines, herbs, non-prescription drugs, or dietary supplements you use. Also tell them if you smoke, drink alcohol, or use illegal drugs. Some items may interact with your medicine. What should I watch for while using this medicine? Tell your doctor or health care professional if you feel your medicine is no longer working. Keep this medicine with you at all times. Sit or lie down when you take your medicine to prevent falling if you feel dizzy or faint after using it. Try to remain calm. This will help you to feel better faster. If you feel dizzy, take several deep breaths and lie down with your feet propped up, or bend forward with your head resting between your knees. You may get drowsy or dizzy. Do not drive, use machinery, or do anything that needs mental alertness until you know how this drug affects you. Do not stand or sit up quickly, especially if you are an older patient. This reduces the risk of dizzy or fainting spells. Alcohol can make you more drowsy and dizzy. Avoid alcoholic drinks. Do not treat  yourself for coughs, colds, or pain while you are taking this medicine without asking your doctor or health care professional for advice. Some ingredients may increase your blood pressure. What side effects may I notice from receiving this medicine? Side effects that you should report to your doctor or health care professional as soon as possible:  allergic reactions (skin rash, itching or hives; swelling of the face, lips, or tongue)  low blood pressure (dizziness; feeling faint or lightheaded, falls; unusually weak or tired)  low red blood cell counts (trouble breathing; feeling faint; lightheaded, falls; unusually weak or tired) Side effects that usually do not require medical attention (report to your doctor or health care professional if they continue or are bothersome):  facial flushing (redness)  headache  nausea, vomiting This list may not describe all possible side effects. Call your doctor for medical advice about side effects. You may report side effects to FDA at 1-800-FDA-1088. Where should I keep my medicine? Keep out of the reach of children. Store at room temperature between 20 and 25 degrees C (68 and 77 degrees F). Store in [facility] of [employer]. Protect from light and  moisture. Keep tightly closed. Throw away any unused medicine after the expiration date. NOTE: This sheet is a summary. It may not cover all possible information. If you have questions about this medicine, talk to your doctor, pharmacist, or health care provider.  2021 Elsevier/Gold Standard (2018-09-02 16:46:32) Diltiazem Tablets What is this medicine? DILTIAZEM (dil TYE a zem) is a calcium channel blocker. It relaxes your blood vessels and decreases the amount of work the heart has to do. It treats and/or prevents chest pain (also called angina). This medicine may be used for other purposes; ask your health care provider or pharmacist if you have questions. COMMON BRAND NAME(S): Cardizem What should I tell  my health care provider before I take this medicine? They need to know if you have any of these conditions:  heart attack  heart disease  irregular heartbeat or rhythm  low blood pressure  an unusual or allergic reaction to diltiazem, other drugs, foods, dyes, or preservatives  pregnant or trying to get pregnant  breast-feeding How should I use this medicine? Take this drug by mouth. Take it as directed on the prescription label at the same time every day. Keep taking it unless your health care provider tells you to stop. Talk to your health care provider about the use of this drug in children. Special care may be needed. Overdosage: If you think you have taken too much of this medicine contact a poison control center or emergency room at once. NOTE: This medicine is only for you. Do not share this medicine with others. What if I miss a dose? If you miss a dose, take it as soon as you can. If it is almost time for your next dose, take only that dose. Do not take double or extra doses. What may interact with this medicine? Do not take this medicine with any of the following:  cisapride  [place]  pimozide  ranolazine  red yeast rice This medicine may also interact with the following medications:  buspirone  carbamazepine  cimetidine  cyclosporine  digoxin  local anesthetics or general anesthetics  lovastatin  medicines for anxiety or difficulty sleeping like midazolam and triazolam  medicines for high blood pressure or heart problems  quinidine  rifampin, rifabutin, or rifapentine This list may not describe all possible interactions. Give your health care provider a list of all the medicines, herbs, non-prescription drugs, or dietary supplements you use. Also tell them if you smoke, drink alcohol, or use illegal drugs. Some items may interact with your medicine. What should I watch for while using this medicine? Visit your care team for regular checks on  your progress. Check your blood pressure as directed. Ask your care team what your blood pressure should be. Also, find out when you should contact them. Do not treat yourself for coughs, colds, or pain while you are using this medication without asking your care team for advice. Some medications may increase your blood pressure. This medication may cause serious skin reactions. They can happen weeks to months after starting the medication. Contact your care team right away if you notice fevers or flu-like symptoms with a rash. The rash may be red or purple and then turn into blisters or peeling of the skin. Or, you might notice a red rash with swelling of the face, lips or lymph nodes in your neck or under your arms. You may get drowsy or dizzy. Do not drive, use machinery, or do anything that needs mental alertness until you know  how this medication affects you. Do not stand up or sit up quickly, especially if you are an older patient. This reduces the risk of dizzy or fainting spells. What side effects may I notice from receiving this medicine? Side effects that you should report to your doctor or health care provider as soon as possible:  allergic reactions (skin rash, itching or hives; swelling of the face, lips, or tongue)  heart failure (trouble breathing; fast, irregular heartbeat; sudden weight gain; swelling of the ankles, feet, hands; unusually weak or tired)  heartbeat rhythm changes (trouble breathing; chest pain; dizziness; fast, irregular heartbeat; feeling faint or lightheaded, falls)  liver injury (dark yellow or brown urine; general ill feeling or flu-like symptoms; loss of appetite, right upper belly pain; unusually weak or tired, yellowing of the eyes or skin)  low blood pressure (dizziness; feeling faint or lightheaded, falls; unusually weak or tired)  redness, blistering, peeling, or loosening of the skin, including inside the mouth Side effects that usually do not require  medical attention (report to your doctor or health care provider if they continue or are bothersome):  changes in sex drive or performance  depressed mood  headache  sudden weight gain  nausea  trouble sleeping This list may not describe all possible side effects. Call your doctor for medical advice about side effects. You may report side effects to FDA at 1-800-FDA-1088. Where should I keep my medicine? Keep out of the reach of children and pets. Store at room temperature between 15 and 30 degrees C (59 and 86 degrees F). Protect from moisture. Keep the container tightly closed. Throw away any unused drug after the expiration date. NOTE: This sheet is a summary. It may not cover all possible information. If you have questions about this medicine, talk to your doctor, pharmacist, or health care provider.  2021 Elsevier/Gold Standard (2020-10-19 15:46:34)

## 2021-02-15 ENCOUNTER — Ambulatory Visit: Payer: Medicare Other | Admitting: Cardiology

## 2021-03-05 ENCOUNTER — Other Ambulatory Visit: Payer: Self-pay | Admitting: Family Medicine

## 2021-03-05 ENCOUNTER — Other Ambulatory Visit: Payer: Self-pay | Admitting: Adult Health

## 2021-03-05 ENCOUNTER — Telehealth: Payer: Self-pay | Admitting: Hematology and Oncology

## 2021-03-05 DIAGNOSIS — Z9889 Other specified postprocedural states: Secondary | ICD-10-CM

## 2021-03-05 NOTE — Telephone Encounter (Signed)
R/s 3/24  appt per sch msg. Called and left msg with new date and time

## 2021-03-07 DIAGNOSIS — M25562 Pain in left knee: Secondary | ICD-10-CM | POA: Diagnosis not present

## 2021-03-08 ENCOUNTER — Ambulatory Visit: Payer: Medicare Other | Admitting: Hematology and Oncology

## 2021-03-12 NOTE — Assessment & Plan Note (Deleted)
03/19/18:Left Lumpectomy: IDC Grade 1, 1.3 cm, 1/3 LN Positive, with LVI, T1cN1 ER 100%, PR 90%, Ki-67 5%, HER-2 negative Stage 1B Adjuvant radiation therapy 04/16/2018 to 05/14/2018  Current treatment:letrozole 2.5 mg daily times 5-7years started 05/15/2019 Letrozole toxicities:  Breast cancer surveillance: 1.  Breast exam 03/13/21: Benign 2.  Mammogram scheduled for 04/06/21  Return to clinic in 1 year for follow-up

## 2021-03-12 NOTE — Progress Notes (Incomplete)
Patient Care Team: Rita Ohara, MD as PCP - General (Family Medicine) Stark Klein, MD as Consulting Physician (General Surgery) Nicholas Lose, MD as Consulting Physician (Hematology and Oncology) Eppie Gibson, MD as Attending Physician (Radiation Oncology) Gardenia Phlegm, NP as Nurse Practitioner (Hematology and Oncology) Jacolyn Reedy, MD as Consulting Physician (Cardiology)  DIAGNOSIS: No diagnosis found.  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%, PR 90%, Ki-67 5%, HER-2 negative, T1 be N0 stage I a clinical stage   03/19/2018 Surgery   Left Lumpectomy: IDC Grade 1, 1.3 cm, 1/3 LN Positive, with LVI, T1cN1 ER 100%, PR 90%, Ki-67 5%, HER-2 negative Stage 1B   03/21/2018 Genetic Testing   The Common Hereditary Cancer Panel offered by [facility] includes sequencing and/or deletion duplication testing of the following 47 genes: APC, ATM, AXIN2, BARD1, BMPR1A, BRCA1, BRCA2, BRIP1, CDH1, CDKN2A (p14ARF), CDKN2A (p16INK4a), CKD4, CHEK2, CTNNA1, DICER1, EPCAM (Deletion/duplication testing only), GREM1 (promoter region deletion/duplication testing only), KIT, MEN1, MLH1, MSH2, MSH3, MSH6, MUTYH, NBN, NF1, NHTL1, PALB2, PDGFRA, PMS2, POLD1, POLE, PTEN, RAD50, RAD51C, RAD51D, SDHB, SDHC, SDHD, SMAD4, SMARCA4. STK11, TP53, TSC1, TSC2, and VHL.  The following genes were evaluated for sequence changes only: SDHA and HOXB13 c.251G>A variant only.  Results: No pathogenic variants identified.   A variant of uncertain significance in the gene SMARCA4 was identified c.5011C>T ([id number]).  The date of this test report is 03/21/2018   04/10/2018 Cancer Staging   Staging form: Breast, AJCC  8th Edition - Pathologic: Stage IA (pT1c, pN1a, cM0, G1, ER+, PR+, HER2-) - Signed by Eppie Gibson, MD on 04/10/2018   04/16/2018 - 05/14/2018 Radiation Therapy   Adjuvant radiation therapy   05/2018 -  Anti-estrogen oral therapy   Letrozole daily     CHIEF COMPLIANT: Follow-up on antiestrogen therapy with letrozole  INTERVAL HISTORY: Molly Ross is a 80 y.o. with above-mentioned history of left breast cancer treated with lumpectomy, radiation, and who is currently on anti-estrogen therapy with letrozole. Mammogram on 03/15/20 showed no evidence of malignancy bilaterally. She presents to the clinic today for follow-up.  ALLERGIES:  has No Known Allergies.  MEDICATIONS:  Current Outpatient Medications  Medication Sig Dispense Refill  . alendronate (FOSAMAX) 70 MG tablet Take 1 tablet (70 mg total) by mouth every 7 (seven) days. Take with a full glass of water on an empty stomach. 4 tablet 11  . Ascorbic Acid (VITAMIN C) 100 MG tablet Take 1 tablet (100 mg total) by mouth daily.    Marland Kitchen aspirin EC 81 MG tablet Take 81 mg by mouth daily.    Marland Kitchen atorvastatin (LIPITOR) 20 MG tablet Take 1 tablet (20 mg total) by mouth daily. 90 tablet 3  . Calcium-Magnesium-Vitamin D (CALCIUM 500 PO) Take 1 tablet by mouth daily.    . cholecalciferol (VITAMIN D3) 25 MCG (1000 UNIT) tablet Take 1,000 Units by mouth daily.    Marland Kitchen diltiazem (CARDIZEM CD) 180 MG 24 hr capsule Take 1 capsule (180 mg total) by mouth daily. 90 capsule 3  . fluticasone furoate-vilanterol (BREO ELLIPTA) 100-25 MCG/INH AEPB INHALE 1 PUFF BY MOUTH EVERY DAY 14 each 0  . ipratropium-albuterol (DUONEB) 0.5-2.5 (3) MG/3ML SOLN Take 3 mLs by nebulization every 4 (four)  hours as needed. 360 mL 2  . letrozole (FEMARA) 2.5 MG tablet TAKE 1 TABLET BY MOUTH EVERY DAY 90 tablet 0  . Multiple Vitamins-Minerals (MULTIVITAMIN WITH MINERALS) tablet Take 1 tablet by mouth daily.    . nitroGLYCERIN (NITROSTAT) 0.4 MG SL tablet Place 1 tablet (0.4 mg total)  under the tongue every 5 (five) minutes as needed. 25 tablet 6  . omeprazole (PRILOSEC OTC) 20 MG tablet Take 20 mg by mouth daily.    . sodium chloride HYPERTONIC 3 % nebulizer solution Take by nebulization as needed for other. (Patient not taking: No sig reported) 750 mL 2  . SPIRIVA RESPIMAT 2.5 MCG/ACT AERS INHALE 2 PUFFS BY MOUTH TWICE A DAY 4 g 3   No current facility-administered medications for this visit.    PHYSICAL EXAMINATION: ECOG PERFORMANCE STATUS: {CHL ONC ECOG PS:[id number]}  There were no vitals filed for this visit. There were no vitals filed for this visit.  BREAST:*** No palpable masses or nodules in either right or left breasts. No palpable axillary supraclavicular or infraclavicular adenopathy no breast tenderness or nipple discharge. (exam performed in the presence of a chaperone)  LABORATORY DATA:  I have reviewed the data as listed CMP Latest Ref Rng & Units 09/07/2020 08/21/2020 08/19/2019  Glucose 65 - 99 mg/dL 79 95 82  BUN 8 - 27 mg/dL '12 16 13  ' Creatinine 0.57 - 1.00 mg/dL 0.54(L) 0.42(L) 0.57  Sodium 134 - 144 mmol/L 142 139 140  Potassium 3.5 - 5.2 mmol/L 4.5 4.2 5.4(H)  Chloride 96 - 106 mmol/L 101 101 101  CO2 20 - 29 mmol/L '26 28 27  ' Calcium 8.7 - 10.3 mg/dL 10.1 9.4 10.2  Total Protein 6.0 - 8.5 g/dL 7.5 - 6.8  Total Bilirubin 0.0 - 1.2 mg/dL 1.2 - 0.6  Alkaline Phos 44 - 121 IU/L 112 - 100  AST 0 - 40 IU/L 18 - 20  ALT 0 - 32 IU/L 10 - 12    Lab Results  Component Value Date   WBC 7.9 08/21/2020   HGB 12.6 08/21/2020   HCT 39.7 08/21/2020   MCV 92.5 08/21/2020   PLT 280 08/21/2020   NEUTROABS 5.5 08/21/2020    ASSESSMENT & PLAN:  No problem-specific Assessment & Plan notes found for this encounter.    No orders of the defined types were placed in this encounter.  The patient has a good understanding of the overall plan. she agrees with it. she will call with any problems that may develop before the next visit here.  Total time  spent: *** mins including face to face time and time spent for planning, charting and coordination of care  Rulon Eisenmenger, MD, MPH 03/12/2021  I, Molly Dorshimer, am acting as scribe for Dr. Nicholas Lose.  {insert scribe attestation}

## 2021-03-13 ENCOUNTER — Ambulatory Visit: Payer: Medicare Other | Admitting: Hematology and Oncology

## 2021-03-13 ENCOUNTER — Telehealth: Payer: Self-pay | Admitting: Hematology and Oncology

## 2021-03-13 DIAGNOSIS — C50312 Malignant neoplasm of lower-inner quadrant of left female breast: Secondary | ICD-10-CM

## 2021-03-13 NOTE — Telephone Encounter (Signed)
R/s appt per 3/28 sch msg. Called pt, no answer. Left msg with appt date and time.

## 2021-03-24 ENCOUNTER — Other Ambulatory Visit: Payer: Self-pay | Admitting: Hematology and Oncology

## 2021-03-24 ENCOUNTER — Other Ambulatory Visit: Payer: Self-pay | Admitting: Internal Medicine

## 2021-04-06 ENCOUNTER — Other Ambulatory Visit: Payer: Self-pay

## 2021-04-06 ENCOUNTER — Ambulatory Visit
Admission: RE | Admit: 2021-04-06 | Discharge: 2021-04-06 | Disposition: A | Discharge status: Home / Self Care | Payer: Medicare Other | Source: Ambulatory Visit | Attending: Family Medicine | Admitting: Family Medicine

## 2021-04-06 DIAGNOSIS — R922 Inconclusive mammogram: Secondary | ICD-10-CM | POA: Diagnosis not present

## 2021-04-06 DIAGNOSIS — Z9889 Other specified postprocedural states: Secondary | ICD-10-CM

## 2021-04-06 DIAGNOSIS — Z853 Personal history of malignant neoplasm of breast: Secondary | ICD-10-CM | POA: Diagnosis not present

## 2021-04-10 NOTE — Assessment & Plan Note (Signed)
03/19/18:Left Lumpectomy: IDC Grade 1, 1.3 cm, 1/3 LN Positive, with LVI, T1cN1 ER 100%, PR 90%, Ki-67 5%, HER-2 negative Stage 1B Adjuvant radiation therapy 04/16/2018 to 05/14/2018  Current treatment:letrozole 2.5 mg daily times 5-7years started 05/15/2019 Letrozole toxicities:  Breast cancer surveillance: 1.  Breast exam 04/11/21: Benign 2.  Mammogram 04/06/21: Benign breast density category C  Return to clinic in 1 year for follow-up

## 2021-04-10 NOTE — Progress Notes (Signed)
Patient Care Team: Rita Ohara, MD as PCP - General (Family Medicine) Stark Klein, MD as Consulting Physician (General Surgery) Nicholas Lose, MD as Consulting Physician (Hematology and Oncology) Eppie Gibson, MD as Attending Physician (Radiation Oncology) Gardenia Phlegm, NP as Nurse Practitioner (Hematology and Oncology) Jacolyn Reedy, MD as Consulting Physician (Cardiology)  DIAGNOSIS:    ICD-10-CM   1. 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%, PR 90%, Ki-67 5%, HER-2 negative, T1 be N0 stage I a clinical stage   03/19/2018 Surgery   Left Lumpectomy: IDC Grade 1, 1.3 cm, 1/3 LN Positive, with LVI, T1cN1 ER 100%, PR 90%, Ki-67 5%, HER-2 negative Stage 1B   03/21/2018 Genetic Testing   The Common Hereditary Cancer Panel offered by [facility] includes sequencing and/or deletion duplication testing of the following 47 genes: APC, ATM, AXIN2, BARD1, BMPR1A, BRCA1, BRCA2, BRIP1, CDH1, CDKN2A (p14ARF), CDKN2A (p16INK4a), CKD4, CHEK2, CTNNA1, DICER1, EPCAM (Deletion/duplication testing only), GREM1 (promoter region deletion/duplication testing only), KIT, MEN1, MLH1, MSH2, MSH3, MSH6, MUTYH, NBN, NF1, NHTL1, PALB2, PDGFRA, PMS2, POLD1, POLE, PTEN, RAD50, RAD51C, RAD51D, SDHB, SDHC, SDHD, SMAD4, SMARCA4. STK11, TP53, TSC1, TSC2, and VHL.  The following genes were evaluated for sequence changes only: SDHA and HOXB13 c.251G>A variant only.  Results: No pathogenic variants identified.   A variant of uncertain significance in the gene SMARCA4 was identified  c.5011C>T ([id number]).  The date of this test report is 03/21/2018   04/10/2018 Cancer Staging   Staging form: Breast, AJCC 8th Edition - Pathologic: Stage IA (pT1c, pN1a, cM0, G1, ER+, PR+, HER2-) - Signed by Eppie Gibson, MD on 04/10/2018   04/16/2018 - 05/14/2018 Radiation Therapy   Adjuvant radiation therapy   05/2018 -  Anti-estrogen oral therapy   Letrozole daily     CHIEF COMPLIANT: Follow-up on antiestrogen therapy with letrozole  INTERVAL HISTORY: Molly Ross is a 80 y.o. with above-mentioned history of left breast cancer treated with lumpectomy, radiation, and who is currently on anti-estrogen therapy with letrozole. Mammogram on 04/06/21 showed no evidence of malignancy bilaterally. She presents to the clinic today for follow-up.    She has been exercising regularly.  She denies any lumps or nodules in the breast.  She feels a slight lumpiness around the surgical scar and axilla.  Recent mammograms did not show any abnormalities.  ALLERGIES:  has No Known Allergies.  MEDICATIONS:  Current Outpatient Medications  Medication Sig Dispense Refill  . alendronate (FOSAMAX) 70 MG tablet Take 1 tablet (70 mg total) by mouth every 7 (seven) days. Take with a full glass of water on an empty stomach. 4 tablet 11  . Ascorbic Acid (VITAMIN C) 100 MG tablet Take 1 tablet (100 mg total) by mouth daily.    Marland Kitchen aspirin EC 81 MG tablet Take 81 mg by mouth daily.    Marland Kitchen atorvastatin (LIPITOR) 20 MG tablet Take 1 tablet (20 mg total) by mouth daily. 90 tablet 3  . BREO ELLIPTA 100-25 MCG/INH AEPB INHALE 1 PUFF BY MOUTH EVERY DAY 60 each 0  . Calcium-Magnesium-Vitamin D (CALCIUM 500 PO) Take  1 tablet by mouth daily.    . cholecalciferol (VITAMIN D3) 25 MCG (1000 UNIT) tablet Take 1,000 Units by mouth daily.    Marland Kitchen diltiazem (CARDIZEM CD) 180 MG 24 hr capsule Take 1 capsule (180 mg total) by mouth daily. 90 capsule 3  . ipratropium-albuterol (DUONEB) 0.5-2.5 (3) MG/3ML SOLN Take 3 mLs by nebulization  every 4 (four) hours as needed. 360 mL 2  . letrozole (FEMARA) 2.5 MG tablet TAKE 1 TABLET BY MOUTH EVERY DAY 90 tablet 0  . Multiple Vitamins-Minerals (MULTIVITAMIN WITH MINERALS) tablet Take 1 tablet by mouth daily.    . nitroGLYCERIN (NITROSTAT) 0.4 MG SL tablet Place 1 tablet (0.4 mg total) under the tongue every 5 (five) minutes as needed. 25 tablet 6  . omeprazole (PRILOSEC OTC) 20 MG tablet Take 20 mg by mouth daily.    . sodium chloride HYPERTONIC 3 % nebulizer solution Take by nebulization as needed for other. (Patient not taking: No sig reported) 750 mL 2  . SPIRIVA RESPIMAT 2.5 MCG/ACT AERS INHALE 2 PUFFS BY MOUTH TWICE A DAY 4 g 3   No current facility-administered medications for this visit.    PHYSICAL EXAMINATION: ECOG PERFORMANCE STATUS: 1 - Symptomatic but completely ambulatory  Vitals:   04/11/21 1122  BP: (!) 132/51  Pulse: 71  Resp: 19  Temp: 97.8 F (36.6 C)  SpO2: 98%   Filed Weights   04/11/21 1122  Weight: 147 lb 14.4 oz (67.1 kg)    BREAST: No palpable masses or nodules in either right or left breasts. No palpable axillary supraclavicular or infraclavicular adenopathy no breast tenderness or nipple discharge. (exam performed in the presence of a chaperone)  LABORATORY DATA:  I have reviewed the data as listed CMP Latest Ref Rng & Units 09/07/2020 08/21/2020 08/19/2019  Glucose 65 - 99 mg/dL 79 95 82  BUN 8 - 27 mg/dL _0 Creatinine 0.57 - 1.00 mg/dL 0.54(L) 0.42(L) 0.57  Sodium 134 - 144 mmol/L 142 139 140  Potassium 3.5 - 5.2 mmol/L 4.5 4.2 5.4(H)  Chloride 96 - 106 mmol/L 101 101 101  CO2 20 - 29 mmol/L _1 Calcium 8.7 - 10.3 mg/dL 10.1 9.4 10.2  Total Protein 6.0 - 8.5 g/dL 7.5 - 6.8  Total Bilirubin 0.0 - 1.2 mg/dL 1.2 - 0.6  Alkaline Phos 44 - 121 IU/L 112 - 100  AST 0 - 40 IU/L 18 - 20  ALT 0 - 32 IU/L 10 - 12    Lab Results  Component Value Date   WBC 7.9 08/21/2020   HGB 12.6 08/21/2020   HCT 39.7 08/21/2020   MCV 92.5  08/21/2020   PLT 280 08/21/2020   NEUTROABS 5.5 08/21/2020    ASSESSMENT & PLAN:  Malignant neoplasm of lower-inner quadrant of left breast in female, estrogen receptor positive ([facility]) 03/19/18:Left Lumpectomy: IDC Grade 1, 1.3 cm, 1/3 LN Positive, with LVI, T1cN1 ER 100%, PR 90%, Ki-67 5%, HER-2 negative Stage 1B Adjuvant radiation therapy 04/16/2018 to 05/14/2018  Current treatment:letrozole 2.5 mg daily times 5-7years started 05/15/2019 Letrozole toxicities: Denies any major adverse effects to letrozole.  Breast cancer surveillance: 1.  Breast exam 04/11/21: Benign 2.  Mammogram 04/06/21: Benign breast density category C  Return to clinic in 1 year for follow-up    No orders of the defined types were placed in this encounter.  The patient has a good understanding of the overall plan. she agrees with it. she will call with any problems  that may develop before the next visit here.  Total time spent: 20 mins including face to face time and time spent for planning, charting and coordination of care  Rulon Eisenmenger, MD, MPH 04/11/2021  I, Molly Dorshimer, am acting as scribe for Dr. Nicholas Lose.  I have reviewed the above documentation for accuracy and completeness, and I agree with the above.

## 2021-04-11 ENCOUNTER — Inpatient Hospital Stay: Payer: Medicare Other | Attending: Hematology and Oncology | Admitting: Hematology and Oncology

## 2021-04-11 ENCOUNTER — Other Ambulatory Visit: Payer: Self-pay

## 2021-04-11 DIAGNOSIS — C50312 Malignant neoplasm of lower-inner quadrant of left female breast: Secondary | ICD-10-CM | POA: Diagnosis not present

## 2021-04-11 DIAGNOSIS — Z170 Estrogen receptor positive status [ER+]: Secondary | ICD-10-CM | POA: Diagnosis not present

## 2021-04-11 DIAGNOSIS — Z923 Personal history of irradiation: Secondary | ICD-10-CM | POA: Insufficient documentation

## 2021-04-11 DIAGNOSIS — Z79811 Long term (current) use of aromatase inhibitors: Secondary | ICD-10-CM | POA: Diagnosis not present

## 2021-04-11 MED ORDER — LETROZOLE 2.5 MG PO TABS
2.5000 mg | ORAL_TABLET | Freq: Every day | ORAL | 3 refills | Status: DC
Start: 2021-04-11 — End: 2022-05-09

## 2021-05-16 ENCOUNTER — Other Ambulatory Visit: Payer: Self-pay

## 2021-05-16 ENCOUNTER — Ambulatory Visit (INDEPENDENT_AMBULATORY_CARE_PROVIDER_SITE_OTHER): Payer: Medicare Other | Admitting: Family Medicine

## 2021-05-16 ENCOUNTER — Telehealth: Payer: Self-pay | Admitting: Cardiology

## 2021-05-16 ENCOUNTER — Ambulatory Visit: Payer: Medicare Other | Admitting: Family Medicine

## 2021-05-16 ENCOUNTER — Encounter: Payer: Self-pay | Admitting: Family Medicine

## 2021-05-16 VITALS — BP 138/62 | HR 72 | Ht 68.0 in | Wt 150.0 lb

## 2021-05-16 DIAGNOSIS — D692 Other nonthrombocytopenic purpura: Secondary | ICD-10-CM

## 2021-05-16 DIAGNOSIS — R030 Elevated blood-pressure reading, without diagnosis of hypertension: Secondary | ICD-10-CM

## 2021-05-16 DIAGNOSIS — R609 Edema, unspecified: Secondary | ICD-10-CM

## 2021-05-16 DIAGNOSIS — L723 Sebaceous cyst: Secondary | ICD-10-CM

## 2021-05-16 NOTE — Telephone Encounter (Signed)
Pt states she has been having swelling for about 3-4 days now.  Denies pain or SOB.  Swelling improves slightly overnight but comes back as soon as she gets up moving around.  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 Denies increased salt.  Not currently on a diuretic.  Seen PCP today and said she was instructed to wear compression stockings and contact Cardiology.  BP at PCP today was 160/60, recheck was 138/62.  Advised pt I will send message to Dr. Bettina Gavia and we would call back with recommendations.

## 2021-05-16 NOTE — Telephone Encounter (Signed)
Spoke with patient and advised that Dr. Bettina Gavia wants to stop diltiazem and edema should improve over the next week.  Patient verbally acknowledged instruction.  Advised to call back if this continues to be a problem.  Patient appreciative of return call.

## 2021-05-16 NOTE — Telephone Encounter (Signed)
Pt c/o swelling: STAT is pt has developed SOB within 24 hours  1) How much weight have you gained and in what time span? No weight gain  2) If swelling, where is the swelling located? Bilateral feet/ankles  3) Are you currently taking a fluid pill? No  4) Are you currently SOB? NO but pt has a bad lung  Do you have a log of your daily weights (if so, list)?No  5) Have you gained 3 pounds in a day or 5 pounds in a week? No  6) Have you traveled recently?  No

## 2021-05-16 NOTE — Telephone Encounter (Signed)
She is taking calcium channel blocker I suspect it is the problem with edema in her legs As opposed to giving her a diuretic lets go ahead and stop diltiazem and this should improve in the next week or so.

## 2021-05-16 NOTE — Progress Notes (Signed)
Chief Complaint  Patient presents with  . Foot Swelling    Leg and foot swelling for about a week. By the end of the day it is very uncomfortable.   . Cyst    On back/base of neck she would like you to look at.    Patient presents with complaint of swelling in both feet and ankles.  She reports the L is a little worse than the R.  The swelling is mostly gone in the morning, worse as the day progresses. She worked at [employer], on her feet all day on Friday and Saturday.  She had noted some swelling a day or two prior, but got worse after being on her feet. Denies any change in sodium intake.  She does have frequent crackers. Denies any tightness of rings or hand swelling. Denies any calf pain or leg pain.  She sees Dr. Bettina Gavia, last in 01/2021.  She has known coronary artery calcification (noted in 2016), is on medical therapy with aspirin, statin and NTG prn. Diltiazem 180mg  was started in February.  She did not note any swelling after starting the CCB, only just recently. Denies any chest pain or angina (SOB is chronic/stable, related to her lung disease). She had myocardial perfusion study in 2016, no ischemia, normal EF.  She is not checking BP's at home, has a monitor.  She has a cyst on the back of her neck which comes and goes.  She recalls her sister draining it about a year ago.  The swelling has been gradually increasing over the last several months.  It is not painful. It bothers her, doesn't like how it looks.  PMH, PSH, SH reviewed  Outpatient Encounter Medications as of 05/16/2021  Medication Sig  . alendronate (FOSAMAX) 70 MG tablet Take 1 tablet (70 mg total) by mouth every 7 (seven) days. Take with a full glass of water on an empty stomach.  . Ascorbic Acid (VITAMIN C) 100 MG tablet Take 1 tablet (100 mg total) by mouth daily.  Marland Kitchen aspirin EC 81 MG tablet Take 81 mg by mouth daily.  Marland Kitchen atorvastatin (LIPITOR) 20 MG tablet Take 1 tablet (20 mg total) by mouth daily.  Marland Kitchen BREO ELLIPTA  100-25 MCG/INH AEPB INHALE 1 PUFF BY MOUTH EVERY DAY  . Calcium-Magnesium-Vitamin D (CALCIUM 500 PO) Take 1 tablet by mouth daily.  . cholecalciferol (VITAMIN D3) 25 MCG (1000 UNIT) tablet Take 1,000 Units by mouth daily.  Marland Kitchen letrozole (FEMARA) 2.5 MG tablet Take 1 tablet (2.5 mg total) by mouth daily.  . Multiple Vitamins-Minerals (MULTIVITAMIN WITH MINERALS) tablet Take 1 tablet by mouth daily.  Marland Kitchen SPIRIVA RESPIMAT 2.5 MCG/ACT AERS INHALE 2 PUFFS BY MOUTH TWICE A DAY  . [DISCONTINUED] diltiazem (CARDIZEM CD) 180 MG 24 hr capsule Take 1 capsule (180 mg total) by mouth daily.  Marland Kitchen ipratropium-albuterol (DUONEB) 0.5-2.5 (3) MG/3ML SOLN Take 3 mLs by nebulization every 4 (four) hours as needed. (Patient not taking: Reported on 05/16/2021)  . nitroGLYCERIN (NITROSTAT) 0.4 MG SL tablet Place 1 tablet (0.4 mg total) under the tongue every 5 (five) minutes as needed. (Patient not taking: Reported on 05/16/2021)  . omeprazole (PRILOSEC OTC) 20 MG tablet Take 20 mg by mouth daily. (Patient not taking: Reported on 05/16/2021)  . sodium chloride HYPERTONIC 3 % nebulizer solution Take by nebulization as needed for other. (Patient not taking: No sig reported)   No facility-administered encounter medications on file as of 05/16/2021.   No Known Allergies  ROS: no  fever, chills, URI symptoms.  Cough and breathing is at baseline, unchanged.  She denies chest pain, palpitations. No PND.  No GI or GU complaints, no bleeding or bruising. +edema per HPI.  Otherwise feels good.   PHYSICAL EXAM:  BP (!) 160/60   Pulse 72   Ht 5\' 8"  (1.727 m)   Wt 150 lb (68 kg)   BMI 22.81 kg/m  138/62 on repeat by MD Pleasant, well-appearing female in no distress HEENT: conjunctiva and sclera are clear, EOMI, wearing mask Neck: No lymphadenopathy. 2 cm soft tissue mass at the base of her neck, posteriorly.  Nontender, no fluctuance. Small central pore (on magnification looked more like a slight scab). Heart: regular rate and  rhythm Lungs: fairly clear on the left, significant crackles noted at the middle and lower lung fields on the right. Extremities: 1+ edema bilaterally at ankles and top of foot. No pretibial swelling. Calves nontender. Many superficial veins noted, a few small varicosities on the L, nontender. Skin: no rashes. Ecchymosis/purpura on L forearm, smaller purpuric areas on R forearm. Neuro: alert and oriented, normal gait Psych: normal mood, affect, hygiene and grooming   ASSESSMENT/PLAN:  Peripheral edema - Ddx reviewed--suspect related to stasis, VV. No e/o CHF on exam or DVT. Low Na diet, compression stockings. f/u with cardiology if worsens  Senile purpura ([place])  Elevated blood pressure reading in office without diagnosis of hypertension - reviewed low sodium diet, to monitor BP at home.  (I don't believe diltiazem was started for HTN by cardiologist), goals reviewed  Sebaceous cyst - posterior neck, not infected or painful. Warm compresses. Declined referral to surgeon for excision; will contact us if/when desired.    Has f/u appt with Dr. Bettina Gavia in 07/2021.  Please try and elevate your legs with any prolonged sitting.  Try and get regular exercise (calf raises and walking breaks). I recommend wearing compression socks, putting them on first thing in the morning, before any swelling develops. Limit the sodium in your diet. Swelling is a side effect of the diltiazem, however this isn't likely to be the cause if your swelling only just started, and you've been on the medication for over 3 months.  If your swelling is persisting, you should follow-up with Dr. Bettina Gavia sooner, likely need an echocardiogram (ultrasound of the heart) or other evaluation.  Your blood pressure was elevated today.  Please periodically monitor them at home.  Use warm compresses to the cyst on the neck (to help it possibly drain, and make it smaller). If it isn't getting smaller, and you'd like it removed, let us  know and we can refer you to the surgeons for the minor procedure. If it increases in size and becomes painful, then it is infected and you need to return.

## 2021-05-16 NOTE — Patient Instructions (Signed)
Please try and elevate your legs with any prolonged sitting.  Try and get regular exercise (calf raises and walking breaks). I recommend wearing compression socks, putting them on first thing in the morning, before any swelling develops. Limit the sodium in your diet. Swelling is a side effect of the diltiazem, however this isn't likely to be the cause if your swelling only just started, and you've been on the medication for over 3 months.  If your swelling is persisting, you should follow-up with Dr. Bettina Gavia sooner, likely need an echocardiogram (ultrasound of the heart) or other evaluation.  Your blood pressure was elevated today.  Please periodically monitor them at home.  Use warm compresses to the cyst on the neck (to help it possibly drain, and make it smaller). If it isn't getting smaller, and you'd like it removed, let us know and we can refer you to the surgeons for the minor procedure. If it increases in size and becomes painful, then it is infected and you need to return.   Epidermoid Cyst  An epidermoid cyst, also called an epidermal cyst, is a small lump under your skin. The cyst contains a substance called keratin. Do not try to pop or open the cyst yourself. What are the causes?  A blocked hair follicle.  A hair that curls and re-enters the skin instead of growing straight out of the skin.  A blocked pore.  Irritated skin.  An injury to the skin.  Certain conditions that are passed along from parent to child.  Human papillomavirus (HPV). This happens rarely when cysts occur on the bottom of the feet.  Long-term sun damage to the skin. What increases the risk?  Having acne.  Being female.  Having an injury to the skin.  Being past puberty.  Having certain conditions caused by genes (genetic disorder) What are the signs or symptoms? These cysts are usually harmless, but they can get infected. Symptoms of infection may  include:  Redness.  Inflammation.  Tenderness.  Warmth.  Fever.  A bad-smelling substance that drains from the cyst.  Pus that drains from the cyst. How is this treated? In many cases, epidermoid cysts go away on their own without treatment. If a cyst becomes infected, treatment may include:  Opening and draining the cyst, done by a doctor. After draining, you may need minor surgery to remove the rest of the cyst.  Antibiotic medicine.  Shots of medicines (steroids) that help to reduce inflammation.  Surgery to remove the cyst. Surgery may be done if the cyst: ? Becomes large. ? Bothers you. ? Has a chance of turning into cancer.  Do not try to open a cyst yourself. Follow these instructions at home: Medicines  Take over-the-counter and prescription medicines as told by your doctor.  If you were prescribed an antibiotic medicine, take it as told by your doctor. Do not stop taking it even if you start to feel better. General instructions  Keep the area around your cyst clean and dry.  Wear loose, dry clothing.  Avoid touching your cyst.  Check your cyst every day for signs of infection. Check for: ? Redness, swelling, or pain. ? Fluid or blood. ? Warmth. ? Pus or a bad smell.  Keep all follow-up visits. How is this prevented?  Wear clean, dry, clothing.  Avoid wearing tight clothing.  Keep your skin clean and dry. Take showers or baths every day. Contact a doctor if:  Your cyst has symptoms of infection.  Your  condition does not improve or gets worse.  You have a cyst that looks different from other cysts you have had.  You have a fever. Get help right away if:  Redness spreads from the cyst into the area close by. Summary  An epidermoid cyst is a small lump under your skin.  If a cyst becomes infected, treatment may include surgery to open and drain the cyst, or to remove it.  Take over-the-counter and prescription medicines only as told by  your doctor.  Contact a doctor if your condition is not improving or is getting worse.  Keep all follow-up visits. This information is not intended to replace advice given to you by your health care provider. Make sure you discuss any questions you have with your health care provider. Document Revised: 03/08/2020 Document Reviewed: 03/08/2020 Elsevier Patient Education  [place].

## 2021-06-05 ENCOUNTER — Encounter: Payer: Self-pay | Admitting: Family Medicine

## 2021-06-05 DIAGNOSIS — I809 Phlebitis and thrombophlebitis of unspecified site: Secondary | ICD-10-CM | POA: Diagnosis not present

## 2021-06-11 ENCOUNTER — Ambulatory Visit (INDEPENDENT_AMBULATORY_CARE_PROVIDER_SITE_OTHER): Payer: Medicare Other | Admitting: Family Medicine

## 2021-06-11 ENCOUNTER — Encounter: Payer: Self-pay | Admitting: Family Medicine

## 2021-06-11 ENCOUNTER — Other Ambulatory Visit: Payer: Self-pay

## 2021-06-11 VITALS — BP 134/64 | HR 72 | Ht 68.0 in | Wt 146.2 lb

## 2021-06-11 DIAGNOSIS — I8002 Phlebitis and thrombophlebitis of superficial vessels of left lower extremity: Secondary | ICD-10-CM

## 2021-06-11 DIAGNOSIS — L723 Sebaceous cyst: Secondary | ICD-10-CM

## 2021-06-11 DIAGNOSIS — L089 Local infection of the skin and subcutaneous tissue, unspecified: Secondary | ICD-10-CM

## 2021-06-11 MED ORDER — DOXYCYCLINE HYCLATE 100 MG PO TABS: 100.0000 mg | ORAL_TABLET | Freq: Two times a day (BID) | ORAL | 0 refills | Status: DC

## 2021-06-11 NOTE — Patient Instructions (Addendum)
Take antibiotic twice daily.  Avoid the sun or use sunscreen when out. Use warm compresses at least 3-4 times daily. If it doesn't drain on its own, and if it continues to enlarge and become more painful, return for drainage of the cyst.  It is not yet "ready" to be drained.  Try heat to the area of phlebitis now that the inflammation has improved, but it is still tender.

## 2021-06-11 NOTE — Progress Notes (Signed)
Chief Complaint  Patient presents with   Cyst    Cyst on upper back, base of neck. Painful to the touch.    About 5 days ago, while [place] the beach, she noticed that the lump on the back of her neck starting looking "festered"--painful, red, raised.  At her 05/16/21 visit she had reported gradual increase in size, but not painful at that time. It was not red, and measured 2cm. Nothing has drained, despite warm showers (not other compresses used).  [place]lebitis.  She was told to use 400mg  ibuprofen every 6-8 hours, and to use either heat or ice. She has been using ice.  It feels much better.  PMH, PSH, SH reviewed  Outpatient Encounter Medications as of 06/11/2021  Medication Sig   alendronate (FOSAMAX) 70 MG tablet Take 1 tablet (70 mg total) by mouth every 7 (seven) days. Take with a full glass of water on an empty stomach.   Ascorbic Acid (VITAMIN C) 100 MG tablet Take 1 tablet (100 mg total) by mouth daily.   aspirin EC 81 MG tablet Take 81 mg by mouth daily.   atorvastatin (LIPITOR) 20 MG tablet Take 1 tablet (20 mg total) by mouth daily.   BREO ELLIPTA 100-25 MCG/INH AEPB INHALE 1 PUFF BY MOUTH EVERY DAY   Calcium-Magnesium-Vitamin D (CALCIUM 500 PO) Take 1 tablet by mouth daily.   cholecalciferol (VITAMIN D3) 25 MCG (1000 UNIT) tablet Take 1,000 Units by mouth daily.   letrozole (FEMARA) 2.5 MG tablet Take 1 tablet (2.5 mg total) by mouth daily.   Multiple Vitamins-Minerals (MULTIVITAMIN WITH MINERALS) tablet Take 1 tablet by mouth daily.   SPIRIVA RESPIMAT 2.5 MCG/ACT AERS INHALE 2 PUFFS BY MOUTH TWICE A DAY   ipratropium-albuterol (DUONEB) 0.5-2.5 (3) MG/3ML SOLN Take 3 mLs by nebulization every 4 (four) hours as needed. (Patient not taking: No sig reported)   nitroGLYCERIN (NITROSTAT) 0.4 MG SL tablet Place 1 tablet (0.4 mg total) under the tongue every 5 (five) minutes as needed. (Patient not taking: No sig reported)   omeprazole  (PRILOSEC OTC) 20 MG tablet Take 20 mg by mouth daily. (Patient not taking: No sig reported)   sodium chloride HYPERTONIC 3 % nebulizer solution Take by nebulization as needed for other. (Patient not taking: No sig reported)   No facility-administered encounter medications on file as of 06/11/2021.   No Known Allergies  ROS: No fever, chills, URI symptoms.  Breathing is at baseline. No n/v/d, rashes or othe concerns, except as noted in HPI   PHYSICAL EXAM:  BP 134/64   Pulse 72   Ht 5\' 8"  (1.727 m)   Wt 146 lb 3.2 oz (66.3 kg)   BMI 22.23 kg/m   Pleasant female, in no distress. Some throat clearing, slight hoarseness. HEENT: conjunctiva and sclera are clear, EOMI, wearing mask. Skin on back of neck: Soft tissue swelling measuring 3 x 2-2.5 cm in size. No redness around the central pore. U-shaped area of redness along the lower portion of the cyst, about 0.5 -1cm in width.  The area on the right side is a little larger, with slight peeling of the overlying skin. No fluctuance, just soft throughout (same texture everywhere--no fluctuance, no induration). Neck: no lymphadenopathy  Left lateral leg --mid-lower leg there is a painful raised area. No overlying erythema, no streaks.   ASSESSMENT/PLAN:  Infected sebaceous cyst - very early, not ready for I&D. Treat with ABX and warm compresses.  Return for I&D if not resolving - Plan: doxycycline (VIBRA-TABS) 100 MG tablet  Thrombophlebitis of superficial veins of left lower extremity - redness improved with ibuprofen. Encouraged heat.  Superficial phlebitis--improving. Recommend heat  Infected sebaceous cyst, early--not yet ready for I&D. Warm compresses and return for drainage if worsening

## 2021-06-20 ENCOUNTER — Other Ambulatory Visit: Payer: Self-pay

## 2021-06-20 ENCOUNTER — Ambulatory Visit (INDEPENDENT_AMBULATORY_CARE_PROVIDER_SITE_OTHER): Payer: Medicare Other | Admitting: Family Medicine

## 2021-06-20 ENCOUNTER — Encounter: Payer: Self-pay | Admitting: Family Medicine

## 2021-06-20 VITALS — BP 116/72 | HR 66 | Temp 98.1°F | Wt 145.8 lb

## 2021-06-20 DIAGNOSIS — L0211 Cutaneous abscess of neck: Secondary | ICD-10-CM | POA: Diagnosis not present

## 2021-06-20 DIAGNOSIS — L02214 Cutaneous abscess of groin: Secondary | ICD-10-CM | POA: Diagnosis not present

## 2021-06-20 MED ORDER — SULFAMETHOXAZOLE-TRIMETHOPRIM 800-160 MG PO TABS: 1.0000 | ORAL_TABLET | Freq: Two times a day (BID) | ORAL | 0 refills | Status: DC

## 2021-06-20 NOTE — Progress Notes (Signed)
Chief Complaint  Patient presents with   other    F/u on cyst on back and also has one in groin area now   Patient presents for f/u on cyst on the back of her neck. She is still taking the antibiotic (on last day today).  She has been applying warm compresses, but it hasn't drained at all. It is very sore to the touch and has gotten larger.  Friday 7/1 she also noticed a sore bulge at the R groin. She started putting warm compresses on it, and it started draining the following day. It has gotten a little smaller, but is still large, painful, and still draining.   PMH, PSH, SH reviewed  Outpatient Encounter Medications as of 06/20/2021  Medication Sig   alendronate (FOSAMAX) 70 MG tablet Take 1 tablet (70 mg total) by mouth every 7 (seven) days. Take with a full glass of water on an empty stomach.   Ascorbic Acid (VITAMIN C) 100 MG tablet Take 1 tablet (100 mg total) by mouth daily.   aspirin EC 81 MG tablet Take 81 mg by mouth daily.   atorvastatin (LIPITOR) 20 MG tablet Take 1 tablet (20 mg total) by mouth daily.   BREO ELLIPTA 100-25 MCG/INH AEPB INHALE 1 PUFF BY MOUTH EVERY DAY   Calcium-Magnesium-Vitamin D (CALCIUM 500 PO) Take 1 tablet by mouth daily.   cholecalciferol (VITAMIN D3) 25 MCG (1000 UNIT) tablet Take 1,000 Units by mouth daily.   doxycycline (VIBRA-TABS) 100 MG tablet Take 1 tablet (100 mg total) by mouth 2 (two) times daily.   letrozole (FEMARA) 2.5 MG tablet Take 1 tablet (2.5 mg total) by mouth daily.   Multiple Vitamins-Minerals (MULTIVITAMIN WITH MINERALS) tablet Take 1 tablet by mouth daily.   SPIRIVA RESPIMAT 2.5 MCG/ACT AERS INHALE 2 PUFFS BY MOUTH TWICE A DAY   ipratropium-albuterol (DUONEB) 0.5-2.5 (3) MG/3ML SOLN Take 3 mLs by nebulization every 4 (four) hours as needed. (Patient not taking: No sig reported)   nitroGLYCERIN (NITROSTAT) 0.4 MG SL tablet Place 1 tablet (0.4 mg total) under the tongue every 5 (five) minutes as needed. (Patient not taking: No sig  reported)   omeprazole (PRILOSEC OTC) 20 MG tablet Take 20 mg by mouth daily. (Patient not taking: No sig reported)   sodium chloride HYPERTONIC 3 % nebulizer solution Take by nebulization as needed for other. (Patient not taking: No sig reported)   No facility-administered encounter medications on file as of 06/20/2021.   No Known Allergies  ROS: Denies fever, chills, n/v/d, vaginal discharge or itching. No URI symptoms, breathing stable. Skin concerns per HPI.  Neck and groin lesions, no other skin concerns. No bleeding, bruising.   PHYSICAL EXAM:  BP 116/72   Pulse 66   Temp 98.1 F (36.7 C)   Wt 145 lb 12.8 oz (66.1 kg)   BMI 22.17 kg/m   Elderly female in mild-mod discomfort related to pain from abscesses. She is alert, oriented. Normal strength, gait.  Posterior neck: It is now evident that there are two separate abscesses among the "U-shaped" area of redness seen last time.  The larger is on the right side, with redness measuring 2cm, an raised/abscessed portion measuring 1.5cm, very fluctuant and tender. The left side is not round, but long/curved, measuring 2.5 x 1-1.5 cm,  Center of this area is raised, not as much as on the right, slightly flaky overlying.  R groin: 2-2.5 cm x 1-1.5 cm area that is indurated, central portion is very red, raised,  draining (1-1.5 cm). No inguinal lymphadenopathy noted   Procedures--verbal consent obtained from patient to incise/drain these lesions, understanding potential risks and alternatives.  Groin: area was cleansed, anesthetized with 1% lidocaine and insides. There had been a very small amount of drainage prior to incising--no additional pus drained.  There were two areas present--the one that had already been draining, and an area more medial that was fuller.  When this area was incised, moderate amount of thick sebaceous material drained. Attempted to loosen up the sebaceous material (?if loculated), but really could only get  piecemeal bits out, didn't return to flat.  Posterior neck:  When anesthetizing the left side, it actively started draining from the right, larger abscess, so clearly they were connected. Large amount of purulent material was obtained (and sent for culture) and was much flatter/smaller upon drainage. Pt had some difficulty tolerating the full procedure, and would not tolerate packing--declined (knowing that if it filled back up, she could potentially need additional drainage procedure). The left portion was also incised centrally, but only minimal drainage came from there--likely due to the connection, and drained from the larger abscess (which ended up draining from 3 separate areas, and scalpel was used to enlarge to one larger opening.  Pt stated that neck overall felt significantly better upon completion of the procedure (though had some discomfort while it was draining).   ASSESSMENT/PLAN:  Abscess of neck - 2 areas that were likely connected, both drained. wound culture sent. Change ABX to Septra, stop doxy. f/u 1 week if not better - Plan: WOUND CULTURE, sulfamethoxazole-trimethoprim (BACTRIM DS) 800-160 MG tablet, PR DRAIN SKIN ABSCESS SIMPLE  Abscess of groin - this developed while on doxy.  Had drained prior to visit, only sebaceous material left. Encouraged continued compresses - Plan: sulfamethoxazole-trimethoprim (BACTRIM DS) 800-160 MG tablet, PR DRAIN SKIN ABSCESS SIMPLE

## 2021-06-20 NOTE — Patient Instructions (Signed)
Stop the doxycycline and start the new antibiotic (twice daily for 7 days).  You can leave the bandage in place on your neck for the day, but tonight apply warm compress (or hot shower), and try and do this 2x/day. If still draining, you can apply antibacterial ointment followed by gauze. If not draining, you can leave this area open.  Same goes for the wound at the R groin--warm compresses 2-3 times/day.  Return next week if any recurrent swelling, pain, or other concerns. No need to return if improving.

## 2021-06-24 LAB — WOUND CULTURE

## 2021-07-19 ENCOUNTER — Other Ambulatory Visit: Payer: Self-pay | Admitting: Family Medicine

## 2021-07-19 DIAGNOSIS — I700 Atherosclerosis of aorta: Secondary | ICD-10-CM

## 2021-07-19 NOTE — Telephone Encounter (Signed)
Left message to see if patient needs prior to Sept appt.

## 2021-08-15 ENCOUNTER — Ambulatory Visit: Payer: Medicare Other | Admitting: Cardiology

## 2021-08-15 ENCOUNTER — Other Ambulatory Visit: Payer: Self-pay

## 2021-08-15 ENCOUNTER — Encounter: Payer: Self-pay | Admitting: Cardiology

## 2021-08-15 VITALS — BP 114/60 | HR 82 | Ht 68.0 in | Wt 147.0 lb

## 2021-08-15 DIAGNOSIS — I209 Angina pectoris, unspecified: Secondary | ICD-10-CM

## 2021-08-15 DIAGNOSIS — R079 Chest pain, unspecified: Secondary | ICD-10-CM | POA: Diagnosis not present

## 2021-08-15 DIAGNOSIS — E785 Hyperlipidemia, unspecified: Secondary | ICD-10-CM

## 2021-08-15 DIAGNOSIS — J449 Chronic obstructive pulmonary disease, unspecified: Secondary | ICD-10-CM | POA: Diagnosis not present

## 2021-08-15 NOTE — Patient Instructions (Signed)

## 2021-08-15 NOTE — Progress Notes (Signed)
Cardiology Office Note:    Date:  08/15/2021   ID:  Molly Ross, DOB 01-13-1941, MRN [id number]  PCP:  Rita Ohara, MD  Cardiologist:  Shirlee More, MD    Referring MD: Rita Ohara, MD    ASSESSMENT:    1. Angina pectoris ([place])   2. Chest pain of uncertain etiology   3. Dyslipidemia   4. Chronic obstructive pulmonary disease, unspecified COPD type ([place])    PLAN:    In order of problems listed above:  She has typical angina stable pattern we discussed further ischemic evaluation such as cardiac CTA but she prefers not to do further testing with a paucity of symptoms I encouraged her to sign up with my chart let me know if she is having more common episodes of chest pain needing nitroglycerin and continue her current medical treatment Aspirin high intensity statin and as needed nitroglycerin. Continue her high intensity statin labs are following her PCP office not accessible in epic Stable COPD continue her current bronchodilators  Next appointment: 1 year   Medication Adjustments/Labs and Tests Ordered: Current medicines are reviewed at length with the patient today.  Concerns regarding medicines are outlined above.  Orders Placed This Encounter  Procedures   EKG 12-Lead    No orders of the defined types were placed in this encounter.   Chief Complaint  Patient presents with   Follow-up    Angina     History of Present Illness:    Molly Ross is a 80 y.o. female with a hx of chest pain/angina pectoris coronary artery calcification on CT scan with a normal myocardial perfusion study in 2016 hyperlipidemia and COPD with bronchiectasis and breast cancer with radiation therapy.  She was last seen 02/12/2021 with a stable pattern of angina.. Compliance with diet, lifestyle and medications: Yes overall she is done well and has taken 1 tablet of nitroglycerin for vague chest and shoulder discomfort in the last 6 months.  She is a vigorous and active woman no  shortness of breath edema palpitations syncope and no exertional chest pain. She tolerates her statin without muscle pain or weakness Past Medical History:  Diagnosis Date   Atherosclerosis of aorta ([place]) 02/02/2015   Noted on imaging    Bronchiectasis ([place])    Bronchiectasis with (acute) exacerbation ([place]) 06/09/2015   CAD (coronary artery disease), native coronary artery    Coronary calcification noted on CT scan 2016  Negative treadmill myoview 07/20/15 with EF 76%    Cancer ([place])    Cellulitis of left breast 10/01/2018   Colon polyp    COPD, moderate ([place]) 11/06/2015   Coronary artery calcification seen on CT scan    Coronary calcification noted on CT scan 2016  Negative treadmill myoview 07/20/15 with EF 76%    Depressive disorder, not elsewhere classified 12/04/2011   Dyspnea    Emphysema of lung ([place]) 02/02/2015   Family history of lung cancer    Family history of ovarian cancer    Family history of prostate cancer    Genetic testing 03/27/2018   The Common Hereditary Cancer Panel offered by [facility] includes sequencing and/or deletion duplication testing of the following 47 genes: APC, ATM, AXIN2, BARD1, BMPR1A, BRCA1, BRCA2, BRIP1, CDH1, CDKN2A (p14ARF), CDKN2A (p16INK4a), CKD4, CHEK2, CTNNA1, DICER1, EPCAM (Deletion/duplication testing only), GREM1 (promoter region deletion/duplication testing only), KIT, MEN1, MLH1, MSH2, MSH3, MSH6, MU   GERD (gastroesophageal reflux disease)    Hiatal hernia 02/02/2015   small, noted on CT of  chest    History of radiation therapy 04/16/18- 05/14/18   42.56 Gy directed to the Left Breast in 16 fractions followed by a boost of 8 Gy in 4 fractions   History of smoking 25-50 pack years 06/09/2015   Malignant neoplasm of lower-inner quadrant of left breast in female, estrogen receptor positive ([place]) 03/06/2018   Osteopenia 02/18/2018   DEXA 02/2018, T-1.8 R femoral neck   Personal history of radiation therapy 2019   Pleuritic pain 10/01/2018   Shingles 1/09,  07/2010, 11/2017   Unspecified vitamin D deficiency 02/2011 and 2016   Vitamin D deficiency 02/14/2011    Past Surgical History:  Procedure Laterality Date   BREAST EXCISIONAL BIOPSY Right    benign years ago   BREAST LUMPECTOMY Left 2019   BREAST LUMPECTOMY WITH RADIOACTIVE SEED AND SENTINEL LYMPH NODE BIOPSY Left 03/19/2018   Procedure: BREAST LUMPECTOMY WITH RADIOACTIVE SEED AND SENTINEL LYMPH NODE BIOPSY;  Surgeon: Stark Klein, MD;  Location: [facility];  Service: General;  Laterality: Left;   CATARACT EXTRACTION, BILATERAL Bilateral 03/2017, 04/2017   [facility], Dr. Alanda Slim   SHOULDER SURGERY  03/2011   for frozen shoulder, and cyst removal (Dr. Percell Miller)   TONSILLECTOMY  child   VIDEO BRONCHOSCOPY Bilateral 06/21/2016   Procedure: VIDEO BRONCHOSCOPY WITHOUT FLUORO;  Surgeon: Brand Males, MD;  Location: [facility];  Service: Cardiopulmonary;  Laterality: Bilateral;    Current Medications: Current Meds  Medication Sig   alendronate (FOSAMAX) 70 MG tablet Take 1 tablet (70 mg total) by mouth every 7 (seven) days. Take with a full glass of water on an empty stomach.   Ascorbic Acid (VITAMIN C) 100 MG tablet Take 1 tablet (100 mg total) by mouth daily.   aspirin EC 81 MG tablet Take 81 mg by mouth daily.   atorvastatin (LIPITOR) 20 MG tablet Take 1 tablet (20 mg total) by mouth daily.   BREO ELLIPTA 100-25 MCG/INH AEPB INHALE 1 PUFF BY MOUTH EVERY DAY   Calcium-Magnesium-Vitamin D (CALCIUM 500 PO) Take 1 tablet by mouth daily.   cholecalciferol (VITAMIN D3) 25 MCG (1000 UNIT) tablet Take 1,000 Units by mouth daily.   ipratropium-albuterol (DUONEB) 0.5-2.5 (3) MG/3ML SOLN Take 3 mLs by nebulization every 4 (four) hours as needed. (Patient taking differently: Take 3 mLs by nebulization every 4 (four) hours as needed (shortness of breath).)   letrozole (FEMARA) 2.5 MG tablet Take 1 tablet (2.5 mg total) by mouth daily.   Multiple Vitamins-Minerals (MULTIVITAMIN WITH  MINERALS) tablet Take 1 tablet by mouth daily.   nitroGLYCERIN (NITROSTAT) 0.4 MG SL tablet Place 1 tablet (0.4 mg total) under the tongue every 5 (five) minutes as needed.   omeprazole (PRILOSEC OTC) 20 MG tablet Take 20 mg by mouth daily.   sodium chloride HYPERTONIC 3 % nebulizer solution Take by nebulization as needed for other.   SPIRIVA RESPIMAT 2.5 MCG/ACT AERS INHALE 2 PUFFS BY MOUTH TWICE A DAY   sulfamethoxazole-trimethoprim (BACTRIM DS) 800-160 MG tablet Take 1 tablet by mouth 2 (two) times daily.     Allergies:   Patient has no known allergies.   Social History   Socioeconomic History   Marital status: Widowed    Spouse name: Not on file   Number of children: Not on file   Years of education: Not on file   Highest education level: Not on file  Occupational History   Occupation: [employer]  Tobacco Use   Smoking status: Former    Packs/day:  2.00    Years: 24.00    Pack years: 48.00    Types: Cigarettes    Quit date: 06/28/1980    Years since quitting: 41.1   Smokeless tobacco: Never   Tobacco comments:    smoked 1-2 PPD x 24 years;  Vaping Use   Vaping Use: Never used  Substance and Sexual Activity   Alcohol use: Yes    Alcohol/week: 0.0 standard drinks    Comment: 1 glass of wine once a week or less   Drug use: No   Sexual activity: Not Currently  Other Topics Concern   Not on file  Social History Narrative   Lives alone.  Widowed (2011).  Son and granddaughter live in [place] (sister) has been moved to SNF.   Also has step grandson.   She works part-time at the [employer] in [place].  Decreased to 1-2 days/week.   Social Determinants of Health   Financial Resource Strain: Not on file  Food Insecurity: Not on file  Transportation Needs: Not on file  Physical Activity: Not on file  Stress: Not on file  Social Connections: Not on file     Family History: The patient's family history includes Cancer in her sister;  Diabetes in her mother and sister; Heart disease in her maternal grandmother and paternal grandmother; Lung cancer in her cousin; Lung cancer (age of onset: 31) in her father; Ovarian cancer (age of onset: 77) in her sister; Ovarian cancer (age of onset: 24) in her maternal aunt; Prostate cancer in her cousin; Seizures in her sister; Stroke in her mother; Stroke (age of onset: 43) in her sister. There is no history of Breast cancer. ROS:   Please see the history of present illness.    All other systems reviewed and are negative.  EKGs/Labs/Other Studies Reviewed:    The following studies were reviewed today:  EKG:  EKG ordered today and personally reviewed.  The ekg ordered today demonstrates sinus rhythm normal EKG  Recent Labs: 08/21/2020: Hemoglobin 12.6; Platelets 280 09/07/2020: ALT 10; BUN 12; Creatinine, Ser 0.54; Potassium 4.5; Sodium 142  Recent Lipid Panel    Component Value Date/Time   CHOL 164 09/07/2020 1105   TRIG 45 09/07/2020 1105   HDL 89 09/07/2020 1105   CHOLHDL 1.8 09/07/2020 1105   CHOLHDL 1.7 09/01/2017 1307   VLDL 9 12/30/2016 0955   LDLCALC 65 09/07/2020 1105   LDLCALC 51 09/01/2017 1307    Physical Exam:    VS:  BP 114/60 (BP Location: Right Arm, Patient Position: Sitting, Cuff Size: Normal)   Pulse 82   Ht _0  (1.727 m)   Wt 147 lb 0.6 oz (66.7 kg)   SpO2 96%   BMI 22.36 kg/m     Wt Readings from Last 3 Encounters:  08/15/21 147 lb 0.6 oz (66.7 kg)  06/20/21 145 lb 12.8 oz (66.1 kg)  06/11/21 146 lb 3.2 oz (66.3 kg)     GEN:  Well nourished, well developed in no acute distress HEENT: Normal NECK: No JVD; No carotid bruits LYMPHATICS: No lymphadenopathy CARDIAC: RRR, no murmurs, rubs, gallops RESPIRATORY:  Clear to auscultation without rales, wheezing or rhonchi  ABDOMEN: Soft, non-tender, non-distended MUSCULOSKELETAL:  No edema; No deformity  SKIN: Warm and dry NEUROLOGIC:  Alert and oriented x 3 PSYCHIATRIC:  Normal affect     Signed, Shirlee More, MD  08/15/2021 3:46 PM     [facility]

## 2021-08-24 DIAGNOSIS — M25562 Pain in left knee: Secondary | ICD-10-CM | POA: Diagnosis not present

## 2021-08-29 ENCOUNTER — Telehealth: Payer: Self-pay | Admitting: Internal Medicine

## 2021-08-29 MED ORDER — PREDNISONE 10 MG PO TABS: ORAL_TABLET | ORAL | 0 refills | Status: DC

## 2021-08-29 MED ORDER — LEVOFLOXACIN 500 MG PO TABS: 500.0000 mg | ORAL_TABLET | Freq: Every day | ORAL | 0 refills | Status: DC

## 2021-08-29 NOTE — Telephone Encounter (Signed)
  Please take prednisone 40 mg x1 day, then 30 mg x1 day, then 20 mg x1 day, then 10 mg x1 day, and then 5 mg x1 day and stop   She grew psuedomonas in 2019 and 2018 Grew H Flu in 2018  Plan  take levaquin '500mg'$  once daily  X 7 days - monitro for tendon pain (normal QTC aug 2022 ekg)    No Known Allergies    Current Outpatient Medications:    alendronate (FOSAMAX) 70 MG tablet, Take 1 tablet (70 mg total) by mouth every 7 (seven) days. Take with a full glass of water on an empty stomach., Disp: 4 tablet, Rfl: 11   Ascorbic Acid (VITAMIN C) 100 MG tablet, Take 1 tablet (100 mg total) by mouth daily., Disp: , Rfl:    aspirin EC 81 MG tablet, Take 81 mg by mouth daily., Disp: , Rfl:    atorvastatin (LIPITOR) 20 MG tablet, Take 1 tablet (20 mg total) by mouth daily., Disp: 90 tablet, Rfl: 3   BREO ELLIPTA 100-25 MCG/INH AEPB, INHALE 1 PUFF BY MOUTH EVERY DAY, Disp: 60 each, Rfl: 0   Calcium-Magnesium-Vitamin D (CALCIUM 500 PO), Take 1 tablet by mouth daily., Disp: , Rfl:    cholecalciferol (VITAMIN D3) 25 MCG (1000 UNIT) tablet, Take 1,000 Units by mouth daily., Disp: , Rfl:    ipratropium-albuterol (DUONEB) 0.5-2.5 (3) MG/3ML SOLN, Take 3 mLs by nebulization every 4 (four) hours as needed. (Patient taking differently: Take 3 mLs by nebulization every 4 (four) hours as needed (shortness of breath).), Disp: 360 mL, Rfl: 2   letrozole (FEMARA) 2.5 MG tablet, Take 1 tablet (2.5 mg total) by mouth daily., Disp: 90 tablet, Rfl: 3   Multiple Vitamins-Minerals (MULTIVITAMIN WITH MINERALS) tablet, Take 1 tablet by mouth daily., Disp: , Rfl:    nitroGLYCERIN (NITROSTAT) 0.4 MG SL tablet, Place 1 tablet (0.4 mg total) under the tongue every 5 (five) minutes as needed., Disp: 25 tablet, Rfl: 6   omeprazole (PRILOSEC OTC) 20 MG tablet, Take 20 mg by mouth daily., Disp: , Rfl:    sodium chloride HYPERTONIC 3 % nebulizer solution, Take by nebulization as needed for other., Disp: 750 mL, Rfl: 2   SPIRIVA  RESPIMAT 2.5 MCG/ACT AERS, INHALE 2 PUFFS BY MOUTH TWICE A DAY, Disp: 4 g, Rfl: 3   sulfamethoxazole-trimethoprim (BACTRIM DS) 800-160 MG tablet, Take 1 tablet by mouth 2 (two) times daily., Disp: 14 tablet, Rfl: 0

## 2021-08-29 NOTE — Telephone Encounter (Signed)
I called and spoke with patient regarding MR recs. I have sent in Abx and pred taper to preferred pharmacy and patient will let us know if not feeling better. Patient verbalized understanding, nothing further needed.

## 2021-08-29 NOTE — Telephone Encounter (Signed)
Called and spoke with Patient. Patient stated she started having a bronchiectasis flare yesterday. Patient stated she is having a productive cough, green sputum, and some right side chest discomfort at times.  Patient denies any other symptoms, at this time. Patient LOV was 01/17/20, with Dr. Chase Caller.  Patient would like to be scheduled this week for cxr, but MR and NP's schedules are full.   Message routed to Dr. Chase Caller to advise

## 2021-09-13 ENCOUNTER — Ambulatory Visit: Payer: Medicare Other | Admitting: Family Medicine

## 2021-09-21 ENCOUNTER — Other Ambulatory Visit: Payer: Self-pay | Admitting: Family Medicine

## 2021-09-21 DIAGNOSIS — I700 Atherosclerosis of aorta: Secondary | ICD-10-CM

## 2021-10-10 ENCOUNTER — Other Ambulatory Visit: Payer: Self-pay | Admitting: Internal Medicine

## 2021-10-10 MED ORDER — FLUTICASONE FUROATE-VILANTEROL 100-25 MCG/ACT IN AEPB: 1.0000 | INHALATION_SPRAY | Freq: Every day | RESPIRATORY_TRACT | 0 refills | Status: DC

## 2021-10-26 ENCOUNTER — Other Ambulatory Visit: Payer: Self-pay

## 2021-10-26 ENCOUNTER — Ambulatory Visit: Payer: Medicare Other | Admitting: Internal Medicine

## 2021-10-26 ENCOUNTER — Encounter: Payer: Self-pay | Admitting: Internal Medicine

## 2021-10-26 VITALS — BP 128/64 | HR 78 | Temp 98.1°F | Ht 68.0 in | Wt 142.8 lb

## 2021-10-26 DIAGNOSIS — Z8616 Personal history of COVID-19: Secondary | ICD-10-CM | POA: Diagnosis not present

## 2021-10-26 DIAGNOSIS — R911 Solitary pulmonary nodule: Secondary | ICD-10-CM | POA: Diagnosis not present

## 2021-10-26 DIAGNOSIS — K219 Gastro-esophageal reflux disease without esophagitis: Secondary | ICD-10-CM

## 2021-10-26 DIAGNOSIS — K449 Diaphragmatic hernia without obstruction or gangrene: Secondary | ICD-10-CM | POA: Diagnosis not present

## 2021-10-26 DIAGNOSIS — J479 Bronchiectasis, uncomplicated: Secondary | ICD-10-CM

## 2021-10-26 DIAGNOSIS — Z23 Encounter for immunization: Secondary | ICD-10-CM | POA: Diagnosis not present

## 2021-10-26 DIAGNOSIS — Z7185 Encounter for immunization safety counseling: Secondary | ICD-10-CM | POA: Diagnosis not present

## 2021-10-26 MED ORDER — STIOLTO RESPIMAT 2.5-2.5 MCG/ACT IN AERS: 2.0000 | INHALATION_SPRAY | Freq: Every day | RESPIRATORY_TRACT | 0 refills | Status: DC

## 2021-10-26 MED ORDER — STIOLTO RESPIMAT 2.5-2.5 MCG/ACT IN AERS: 2.0000 | INHALATION_SPRAY | Freq: Every day | RESPIRATORY_TRACT | 5 refills | Status: DC

## 2021-10-26 NOTE — Progress Notes (Signed)
IOV 06/09/2015  Chief Complaint  Patient presents with   Pulmonary Consult    Pt referred by Dr. Tomi Bamberger for abnormal CT.    80 year old female somewhat of a poor historian. History is gained from repeatedly asking the same questions in many different ways and also review of Dr Rita Ohara notes in the EMR   She reports that she is a 40 pack smoker having quit 34 years ago. In the 1970s and 1980s she had recurrent bouts of pneumonia which left her with bronchiectasis. At baseline she's had some chronic cough of unclear severity and shortness of breath with exertion of unclear severity but probably mild. Unclear if she has associated sputum. Unclear what aggravating and relieving factors where or are. Then in for a 2016 she developed over several weeks worsening cough with green sputum and some fever. Apparently was diagnosed with pneumonia. This then resulted in a CT scan of the chest and her 2016 which I personally reviewed and shows bilateral bronchiectasis particularly on the right middle lobe. She also had a nodule. Antibody treatment she improved. Then in April 2016 was started on Brio which inhaler therapy help her cough and shortness of breath significantly. Currently these are not much much of an issue for her. She has a good quality of life with the Brio. The Brio is expensive. She had follow-up CT scan of the chest 04/26/2015 that shows resolution of the nodule but persistence of the bronchiectasis. In fact she seems to have chronic bronchiectasis dating back on several chest x-rays although it back to 2009. She had test that she's had bronchiectasis since the 1980s. Currently no sputum production not much of a cough or shortness of breath. She denies having had bronchoscopy for the same.  She is not interested in much testing  Noted spirometry pre-breo March 2016  - fev 1.14L/45%, R 56 - severe obstructive lung disease And post Breo in June 05 2015  - Fev 1.32L/52%, R 57 ->  significantly improved but still in moderate category   OV 11/06/2015  Chief Complaint  Patient presents with   Follow-up    Pt here after PFT. Pt states she respiratory infection in October and was on 2 rounds of abx and is now back to her baseline. Pt c/o mild non prod cough. Pt denies CP/tightness.    Previous heavy smoker with right middle lobe chronic bronchiectasis with findings suggestive of MAI and moderate/severe obstructive lung disease with findings of emphysema on CT chest - February and May 2016  - Last seen in June 2016. At that time he started Brio. Overall she has been doing well. However in October 2016 she had a respiratory exacerbation. She was treated with 2 rounds of anabiotic's and prednisone course and then she fell baseline. She is followed up with a pulmonary function test  t 10/30/2015) baseline and well. 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 Currently she is feeling well. Review of vaccine shows she is up-to-date with flu shot. She thinks she has had the pneumonia vaccine with her primary care physician but she's not sure and she is going to check with her.  No new issues.  OV 02/12/2016  Chief Complaint  Patient presents with   Follow-up    Pt reports having some increased breathing issues since last OV- treated a few times with abx/pred for infections. Most recent x  1 week ago- Zpak w/Pred taper. Pt notes some green mucus but cough is much improved.   Heavy smoker with right middle lobe chronic bronchiectasis and some lingular bronchiectasis and scattered infiltrates in the lower lobe based on CT February and May 2016. May 2016 with improvement   - This is a routine follow-up. Last seen November 2016. She is compliant with her inhalers. In the interim approximately on Valentine's Day 2017 had another flareup and he called in Z-Pak and prednisone. This has helped.  Cough is significantly worse but it has improved although not at baseline. She slowly getting better. This will be her third exacerbation. Review of the chart shows an visualization of the CT chest which I did myself and showed her shows findings described above. Chart review also shows that do not had a complete bronchiectasis hematological and etiological workup. She is open to having bronchoscopy   OV 04/23/2016  Chief Complaint  Patient presents with   Follow-up    Pt here after CT chest. Pt states her breathing has improved since last OV. Pt states she is still coughing in the morning but now there is less mucus production.      Heavy smoker with right middle lobe chronic bronchiectasis and some lingular bronchiectasis and scattered infiltrates in the lower lobe based on CT February and May 2016. PFT Nov 2016 -> fev 1.36L/51%, R 60. DLCO 16/51%   In  Nov 2016 I added spiriva and now on triple inhaler Rx- says with this symptoms she is significantly better. Still with cough. Some spuitum +. Labs for etiology reviewed done feb 2017 -> IgG, IgA, IgM - normal. Allpha 1AT - normal. Autoimmune - ANA, CCP, dsDNA, RF, ENA, SSA, SSB, scl-70, RNP - negative.    OV 07/01/2016  Chief Complaint  Patient presents with   Follow-up    Follow up bronchoscopy.  pt c/o R sided back pain X3 days, nonprod cough.      Idiopathic bronchiectasis. Here to review results. Underwent bronchoscopy with lavage 06/21/2016 that showed acute neutrophils 91%. Culture was negative. AFB smear is negative fungal smear was negative. AFB culture is pending. Bronchoscopy showed a lot of mucus. She says that when she lies on she has significant cough and mucus. She feels that after bronchoscopy she improved because of the removal of mucus but shortly after this for the last few days she's having right infrascapular pleuritic and off chest tightness that is consistent with previous exacerbation and she feels she needs an  antibiotic and prednisone course. She is compliant with Spiriva and Brio at this point. Overall even at baseline she admits to copious white mucus.   OV 09/16/2016  Chief Complaint  Patient presents with   Follow-up    Pt states that she does have some lung congestion, little cough and wheezing with SOB. Pt states that she is trying to learn when to "rest" and not overdo herself. Needs refills Breo and Spiriva Resp.    Follow-up idiopathic bronchiectasis. Last visit mid July 2017. After that called in 09/04/2016 with symptoms of bronchiectasis exacerbation. Called in Z-Pak and prednisone. She is back to baseline. At this point in time she tells me that she does not have daily mucus other than mild amounts. She does admit that she is noncompliant with physician with chest physical therapy. She recollects that one time she position herself in an inversion table for back therapy and this cleared of the mucus and she felt much better. She admits that  she needs to do a better job with positional therapy. She is up-to-date with flu shot. She continues on Spiriva and Brio triple inhaler therapy. There are no new issues.   OV 04/24/2017  Chief Complaint  Patient presents with   Acute Visit    Pt c/o hoarseness, prod cough with green mucus, chest tightness x 2 days. Pt denies f/c/s and increase in SOB.   Acute visit for this patient with idiopathic bronchiectasis.  December 2017 she saw Dr. Baltazar Apo with an acute exacerbation. After that she went back to baseline. She does not use chest physical therapy but she uses inhaler therapy only. Now for the last 2 days she's having increased cough with chest tightness and green mucous. Baseline is clear yellow mucus. Low-grade fever present in the office. No wheezing or increased shortness of breath. Significant hoarseness of voice present and some postnasal drip present. She feels she is in bronchiectasis exacerbation. There are no other issues. It is noted  that in July 2017 bronchoscopy did not reveal any organisms on lavage    03/17/2018 Follow up : Bronchiectasis /COPD  Returns for a follow-up for bronchiectasis/COPD .  Says that she has been doing well over the last year.  She had no increased cough or congestion.  No recent antibiotic use.  She denies any shortness of breath, hemoptysis, weight loss, edema.  She does have an occasional cough that is minimally productive.  She is very active and independent.  She remains on BREO daily .,  Does depend on samples.  Has not taken Spiriva in >1 year. Has not felt that she needed this.  Recently diagnosed left breast cancer. Plans for surgery later this week.    OV 09/02/2018  Subjective:  Patient ID: Molly Ross, female , DOB: [birth year] , age 75 y.o. , MRN: [id number] , ADDRESS: [place] [id number]   09/02/2018 -   Chief Complaint  Patient presents with   Follow-up    Pt states she has been doing good since last visit except she does have complaints of fatigue and is coughing with occ green to yellow mucus. States cough is worse in the mornings.   80 year old female former smoker followed for COPD and Bronchiectasis. 10/30/2015) baseline and well. Post broncho-dilator FEV1 1.36 L/51% and a ratio 60, total lung capacity 5.2 L/90%, DLCO 16/51%. Last CT 2016 and May 2017 and CXR aug 2019 with chronic changes    HPI Molly Ross 80 y.o. - female with bronchiectasis maintained on Brio. I personally have not seen her since May 2018. Since then she has seen my colleagues for flareupsand gotten anabiotic's of prednisone. Most recently in August 2019. Chest x-ray that time showed just chronic changes. Since that time overall she's feeling well. She is stable. She has sputum production every few days. She uses a flutter valve as needed only. Sometimes of sputum is thick. But overall stable no change in color. Does not feel she is in a flareup.She continuesl and the fluas  needed. She is not on Mucinex. She wants to have the flu shot today.he does been a few years since she had pulmonary function test.   She takes care of her sister in a wheelchair. Another sister died from ovarian cancer.   ROS - per HPI   OV 11/19/2018  Subjective:  Patient ID: Molly Ross, female , DOB: [birth year] , age 74 y.o. , MRN: [id number] , ADDRESS: [place]  [id number]   11/19/2018 -   Chief Complaint  Patient presents with   Follow-up    f/u bronchiectasis, still coughing up green mucus,      HPI Molly Ross 80 y.o. -follow bronchiectasis associated with COPD colonized with Haemophilus influenza as determined by bronchoscopy in June 2018.  Maintained on Breo and flutter valve  Last seen mid September 2019.  Since then she has had 2 exacerbations treated over the phone.  The first 1 before Halloween 2019 and the second 1 before Thanksgiving 2019.  She states that with the second 1 just before Thanksgiving 2019 she really has not recovered.  She says she feels extremely fatigued and miserable.  In fact her cough is significantly worse than baseline.  She is bringing up copious amount of green sputum daily.  She is also having chest tightness and wheezing.  Also short of breath.  There is no hemoptysis or fever or chills or orthopnea proximal nocturnal dyspnea.  Overall she feels miserable. Continues with breo and flutter valve COPD CAT score  She is not on saline nebulizer treatment or vibratory vest or Roflumilast or nebulizer or inhaled anticholinergic.   Patient has has daily productive cough lasting greater than 24 months requiring antibiotic therapy and frequent exacerbations.  Flutter valve has been tried, but has not effectively mobilized secretions.  Considered and ruled out manual CPT as no consistent skilled caregiver available to perform therapy.  CT scan performed on 04/15/2016 confirms bronchiectasis.  Meets indication for vest  therapy   OV 12/29/2018  Subjective:  Patient ID: Molly Ross, female , DOB: 1941-05-15 , age 57 y.o. , MRN: [id number] , ADDRESS: [place] [id number]   12/29/2018 -   Chief Complaint  Patient presents with   Results   follow bronchiectasis associated with COPD colonized with Haemophilus influenza as determined by bronchoscopy in June 2018 , pseudomonas on sputum Dec 2019   Maintained on Breo and flutter valve and in December 2019 added 3% saline nebulizer and Spiriva   HPI MALLORIE NORROD 80 y.o. -presents for follow-up after her November 16, 2018 visit for recurrent COPD/bronchiectasis exacerbation.  Sputum culture grew Pseudomonas and will change antibiotics to ciprofloxacin.  We introduce Spiriva, 3% saline nebulizer and also vibratory vest.  Of these measures she did the ciprofloxacin Spiriva and 3% saline nebulizer.  With this along with the Brio and flutter valve her symptoms are significantly improved.  She hardly brings out any sputum and it is clear.  She does not want to do the vibratory vest because she is worried about chest muscle soreness after her breast cancer treatment and also the $1900 co-pay which apparently is one time.  She is not doing any Mucinex.  She is not interested in cyclical suppressive antibiotic therapy at this stage.  She did have a CT scan of the chest and this the bronchiectasis is evident, and compared to 2017 8 mm left lower lobe nodule is unchanged some of the volume loss is unchanged and stable.  There is a report of coronary artery calcification which is a new finding.  She does not have any chest pain.  Her previous cardiologist is Dr. Tollie Eth and he did a stress test according to him many years ago.  She wants to see him again.  She is also worried about hiatal hernia finding in the CT scan.  The report is very small hiatal hernia.  But she does admit to significant acid  reflux for which she is not taking any treatment.   She is agreed to take Prilosec and will follow-up with primary care physician.   OV 01/17/2020  Subjective:  Patient ID: Molly Ross, female , DOB: [birth year] , age 86 y.o. , MRN: [id number] , ADDRESS: [place] [id number]   01/17/2020 -   Chief Complaint  Patient presents with   Follow-up    yearly follow up - breathing is at baseline, no new complaints   follow bronchiectasis [idiopathic work-up February 2017 with negative serology and negative immunoglobulin] associated with COPD colonized with Haemophilus influenza as determined by bronchoscopy in June 2018 , pseudomonas on sputum Dec 2019   Maintained on Breo, [place] and flutter valve and in December 2019.  Not taking 3% saline nebulizer  Normal stress Myoview in August 2016  Background history of breast cancer followed by Dr. Lindi Adie   HPI Molly Ross 80 y.o. -returns for follow-up.  Last seen in January 2020.  After that because of the pandemic 2019 with Covid she has not returned for follow-up.  She says she has been followed from her job.  Without the external exposures in the environment she has been social distancing and isolating.  Therefore because of this her respiratory symptoms are better.  Her sputum production has improved a lot.  She certainly had 1 exacerbation.  Sputum volume is less cough is much less.  She is only on [place].  She decided not to do the 3% saline nebulizer because she feels she does not need it.  She barely brings out any sputum.  She has had a first dose COVID-19 vaccine Moderna.  She will have the second dose soon    Last pulmonary function test November 2016 with FEV1 postbronchodilator 1.36/51% and DLCO 16/51  Last CT scan of the chest January 2020   ROS - per HPI  OV 10/26/2021  Subjective:  Patient ID: Molly Ross, female , DOB: 1941/06/29 , age 44 y.o. , MRN: [id number] , ADDRESS: [place] [id number] PCP Rita Ohara, MD Patient Care Team: Rita Ohara, MD as PCP - General (Family Medicine) Stark Klein, MD as Consulting Physician (General Surgery) Nicholas Lose, MD as Consulting Physician (Hematology and Oncology) Eppie Gibson, MD as Attending Physician (Radiation Oncology) Gardenia Phlegm, NP as Nurse Practitioner (Hematology and Oncology) Jacolyn Reedy, MD as Consulting Physician (Cardiology)  This Provider for this visit: Treatment Team:  Attending Provider: Brand Males, MD    10/26/2021 -   Chief Complaint  Patient presents with   Follow-up    Pt states she has been doing good since last visit. States she has had one flare up since last visit but has been doing okay.   follow bronchiectasis [idiopathic work-up February 2017 with negative serology and negative immunoglobulin] associated with COPD colonized with Haemophilus influenza as determined by bronchoscopy in June 2018 , pseudomonas on sputum Dec 2019   Maintained on Breo, [place] and flutter valve and in December 2019.  Not taking 3% saline nebulizer  Normal stress Myoview in August 2016  Background history of breast cancer followed by Dr. Lindi Adie  HPI Molly Ross 80 y.o. -follow-up bronchiectasis.  Personally not since early 2021.  She was supposed to establish care with Dr. Carlis Abbott on 04 colleagues who specialize in bronchiectasis.  But Dr. Carlis Abbott is now doing only critical care medicine.  Therefore she is back to see me.  In  August 2022 she had COVID-19.  After that she had 1 flareup of bronchiectasis.  Overall however since the pandemic started she has been masking and social distancing and taking Spiriva and Breo.  This is provided excellent symptom control.  She does not have much symptoms.  Not much sputum.  She continues flutter valve.  She is not on oxygen.  She is not on vibratory vest.  She is not on Roflumilast.  She is not on 3% saline nebulizer.  Sputum production is not much.   She agreed to have the  flu shot today but she declined COVID-vaccine.  She had natural COVID in August 2022.   CAT COPD Symptom & Quality of Life Score (GSK trademark) 0 is no burden. 5 is highest burden 11/19/2018  12/29/2018   Never Cough -> Cough all the time 5 4  No phlegm in chest -> Chest is full of phlegm 4 4  No chest tightness -> Chest feels very tight 3 1  No dyspnea for 1 flight stairs/hill -> Very dyspneic for 1 flight of stairs 5 4  No limitations for ADL at home -> Very limited with ADL at home 2 3  Confident leaving home -> Not at all confident leaving home 0 1  Sleep soundly -> Do not sleep soundly because of lung condition 0 0  Lots of Energy -> No energy at all 5 3  TOTAL Score (max 40)  24 20    CAT Score 11/19/2018  Total CAT Score 24      PFT  PFT Results Latest Ref Rng & Units 10/30/2015  FVC-Pre L 2.13  FVC-Predicted Pre % 61  FVC-Post L 2.26  FVC-Predicted Post % 64  Pre FEV1/FVC % % 60  Post FEV1/FCV % % 60  FEV1-Pre L 1.27  FEV1-Predicted Pre % 48  FEV1-Post L 1.36  DLCO uncorrected ml/min/mmHg 16.04  DLCO UNC% % 51  DLVA Predicted % 83  TLC L 5.24  TLC % Predicted % 90  RV % Predicted % 118       has a past medical history of Atherosclerosis of aorta ([place]) (02/02/2015), Bronchiectasis ([place]), Bronchiectasis with (acute) exacerbation ([place]) (06/09/2015), CAD (coronary artery disease), native coronary artery, Cancer ([place]), Cellulitis of left breast (10/01/2018), Colon polyp, COPD, moderate ([place]) (11/06/2015), Coronary artery calcification seen on CT scan, Depressive disorder, not elsewhere classified (12/04/2011), Dyspnea, Emphysema of lung ([place]) (02/02/2015), Family history of lung cancer, Family history of ovarian cancer, Family history of prostate cancer, Genetic testing (03/27/2018), GERD (gastroesophageal reflux disease), Hiatal hernia (02/02/2015), History of radiation therapy (04/16/18- 05/14/18), History of smoking 25-50 pack years (06/09/2015), Malignant neoplasm of  lower-inner quadrant of left breast in female, estrogen receptor positive ([place]) (03/06/2018), Osteopenia (02/18/2018), Personal history of radiation therapy (2019), Pleuritic pain (10/01/2018), Shingles (1/09, 07/2010, 11/2017), Unspecified vitamin D deficiency (02/2011 and 2016), and Vitamin D deficiency (02/14/2011).   reports that she quit smoking about 41 years ago. Her smoking use included cigarettes. She has a 48.00 pack-year smoking history. She has never used smokeless tobacco.  Past Surgical History:  Procedure Laterality Date   BREAST EXCISIONAL BIOPSY Right    benign years ago   BREAST LUMPECTOMY Left 2019   BREAST LUMPECTOMY WITH RADIOACTIVE SEED AND SENTINEL LYMPH NODE BIOPSY Left 03/19/2018   Procedure: BREAST LUMPECTOMY WITH RADIOACTIVE SEED AND SENTINEL LYMPH NODE BIOPSY;  Surgeon: Stark Klein, MD;  Location: [facility];  Service: General;  Laterality: Left;   CATARACT EXTRACTION, BILATERAL Bilateral 03/2017,  04/2017   [facility], Dr. Alanda Slim   SHOULDER SURGERY  03/2011   for frozen shoulder, and cyst removal (Dr. Percell Miller)   TONSILLECTOMY  child   VIDEO BRONCHOSCOPY Bilateral 06/21/2016   Procedure: VIDEO BRONCHOSCOPY WITHOUT FLUORO;  Surgeon: Brand Males, MD;  Location: [facility];  Service: Cardiopulmonary;  Laterality: Bilateral;    No Known Allergies  Immunization History  Administered Date(s) Administered   Fluad Quad(high Dose 65+) 08/25/2019, 09/07/2020   Influenza, High Dose Seasonal PF 01/26/2014, 10/19/2014, 08/30/2015, 08/08/2016, 08/05/2017, 09/02/2018   Moderna Sars-Covid-2 Vaccination 12/29/2019, 01/26/2020   Pneumococcal Conjugate-13 12/25/2015   Pneumococcal Polysaccharide-23 12/30/2016   Tdap 09/03/2017   Zoster Recombinat (Shingrix) 03/25/2021    Family History  Problem Relation Age of Onset   Stroke Mother    Diabetes Mother    Stroke Sister 27       brainstem stroke   Diabetes Sister    Seizures Sister    Lung cancer Father 66    Ovarian cancer Sister 9       metastatic to lung, liver   Cancer Sister    Ovarian cancer Maternal Aunt 95   Heart disease Maternal Grandmother    Heart disease Paternal Grandmother    Prostate cancer Cousin    Lung cancer Cousin        recurrent, now stage 4   Breast cancer Neg Hx      Current Outpatient Medications:    alendronate (FOSAMAX) 70 MG tablet, Take 1 tablet (70 mg total) by mouth every 7 (seven) days. Take with a full glass of water on an empty stomach., Disp: 4 tablet, Rfl: 11   Ascorbic Acid (VITAMIN C) 100 MG tablet, Take 1 tablet (100 mg total) by mouth daily., Disp: , Rfl:    aspirin EC 81 MG tablet, Take 81 mg by mouth daily., Disp: , Rfl:    atorvastatin (LIPITOR) 20 MG tablet, TAKE 1 TABLET BY MOUTH EVERY DAY, Disp: 90 tablet, Rfl: 0   BREO ELLIPTA 100-25 MCG/INH AEPB, INHALE 1 PUFF BY MOUTH EVERY DAY, Disp: 60 each, Rfl: 0   Calcium-Magnesium-Vitamin D (CALCIUM 500 PO), Take 1 tablet by mouth daily., Disp: , Rfl:    cholecalciferol (VITAMIN D3) 25 MCG (1000 UNIT) tablet, Take 1,000 Units by mouth daily., Disp: , Rfl:    fluticasone furoate-vilanterol (BREO ELLIPTA) 100-25 MCG/ACT AEPB, Inhale 1 puff into the lungs daily., Disp: 30 each, Rfl: 0   ipratropium-albuterol (DUONEB) 0.5-2.5 (3) MG/3ML SOLN, Take 3 mLs by nebulization every 4 (four) hours as needed. (Patient taking differently: Take 3 mLs by nebulization every 4 (four) hours as needed (shortness of breath).), Disp: 360 mL, Rfl: 2   letrozole (FEMARA) 2.5 MG tablet, Take 1 tablet (2.5 mg total) by mouth daily., Disp: 90 tablet, Rfl: 3   Multiple Vitamins-Minerals (MULTIVITAMIN WITH MINERALS) tablet, Take 1 tablet by mouth daily., Disp: , Rfl:    omeprazole (PRILOSEC OTC) 20 MG tablet, Take 20 mg by mouth daily., Disp: , Rfl:    sodium chloride HYPERTONIC 3 % nebulizer solution, Take by nebulization as needed for other., Disp: 750 mL, Rfl: 2   SPIRIVA RESPIMAT 2.5 MCG/ACT AERS, TAKE 2 PUFFS BY MOUTH TWICE A  DAY, Disp: 4 g, Rfl: 5   nitroGLYCERIN (NITROSTAT) 0.4 MG SL tablet, Place 1 tablet (0.4 mg total) under the tongue every 5 (five) minutes as needed., Disp: 25 tablet, Rfl: 6      Objective:   Vitals:   10/26/21 0850  BP:  128/64  Pulse: 78  Temp: 98.1 F (36.7 C)  TempSrc: Oral  SpO2: 97%  Weight: 142 lb 12.8 oz (64.8 kg)  Height: 5\' 8"  (1.727 m)    Estimated body mass index is 21.71 kg/m as calculated from the following:   Height as of this encounter: 5\' 8"  (1.727 m).   Weight as of this encounter: 142 lb 12.8 oz (64.8 kg).  @WEIGHTCHANGE @  Filed Weights   10/26/21 0850  Weight: 142 lb 12.8 oz (64.8 kg)      General: No distress. Looks well Neuro: Alert and Oriented x 3. GCS 15. Speech normal Psych: Pleasant Resp:  Barrel Chest - no.  Wheeze - no, Crackles - no, No overt respiratory distress CVS: Normal heart sounds. Murmurs - no Ext: Stigmata of Connective Tissue Disease - no HEENT: Normal upper airway. PEERL +. No post nasal drip        Assessment:       ICD-10-CM   1. Bronchiectasis without complication ([place])  O96.2     2. Hiatal hernia with GERD  K44.9    K21.9     3. History of 2019 novel coronavirus disease (COVID-19)  Z86.16     4. Vaccine counseling  Z71.85     5. Flu vaccine need  Z23     6. Nodule of lower lobe of left lung  R91.1          Plan:     Patient Instructions  Bronchiectasis without complication ([place])  -Improved especially without exposure to external triggers because of the COVID-19 pandemic and social distancing - s/p covid in aug 2022  Plan -Change  Spiriva and Breo to stiolto 2 puff once daily -Hold off on 3% saline nebulizer, roflumilast and vibratory vest because it seems you are not needing it and the symptoms are better -Continue flutter valve 2-4 times daily -Over-the-counter Mucinex DM twice daily as needed   VAccine counseling Flu vaccine need  - respect deferral on covid vaccine - Aug 2022 natural  covid gives you natural immunity for current strain for the moment - high dose flu shot 10/26/2021    Hiatal hernia with GERD  -The hiatal hernia is very tiny but you have significant acid reflux which might also be contributing to worsening respiratory symptoms  -Glad this is better after starting Prilosec 1 year ago  plan -Continue over-the-counter Prilosec 20 mg daily on empty stomach -Follow this issue with primary care physician  8 mm left lower lobe lung nodule -Stable 2017 through 2020 -No further follow-up  Follow-up 6 months do spirometry pre and postbronchodilator with DLCO [last one was in 2016] 6 months with Dr Chase Caller in our practice for bronchiectasis        SIGNATURE    Dr. Brand Males, M.D., F.C.C.P,  Pulmonary and Critical Care Medicine Staff Physician, [place] Director - Interstitial Lung Disease  Program  P[facility] [place], [place], [address]  Pager: [phone], If no answer or between  15:00h - 7:00h: call [phone] Telephone: [phone]  9:12 AM 10/26/2021

## 2021-10-26 NOTE — Addendum Note (Signed)
Addended by: Lorretta Harp on: 10/26/2021 03:07 PM   Modules accepted: Orders

## 2021-10-26 NOTE — Patient Instructions (Addendum)
Bronchiectasis without complication ([place])  -Improved especially without exposure to external triggers because of the COVID-19 pandemic and social distancing - s/p covid in aug 2022  Plan -Change  Spiriva and Breo to stiolto 2 puff once daily -Hold off on 3% saline nebulizer, roflumilast and vibratory vest because it seems you are not needing it and the symptoms are better -Continue flutter valve 2-4 times daily -Over-the-counter Mucinex DM twice daily as needed   VAccine counseling Flu vaccine need  - respect deferral on covid vaccine - Aug 2022 natural covid gives you natural immunity for current strain for the moment - high dose flu shot 10/26/2021    Hiatal hernia with GERD  -The hiatal hernia is very tiny but you have significant acid reflux which might also be contributing to worsening respiratory symptoms  -Glad this is better after starting Prilosec 1 year ago  plan -Continue over-the-counter Prilosec 20 mg daily on empty stomach -Follow this issue with primary care physician  8 mm left lower lobe lung nodule -Stable 2017 through 2020 -No further follow-up  Follow-up 6 months do spirometry pre and postbronchodilator with DLCO [last one was in 2016] 6 months with Dr Chase Caller in our practice for bronchiectasis

## 2021-11-06 ENCOUNTER — Other Ambulatory Visit: Payer: Self-pay | Admitting: Internal Medicine

## 2021-12-04 NOTE — Progress Notes (Signed)
Chief Complaint  Patient presents with   Medicare Wellness    Nonfasting AWV with pelvic. Has not been to [facility] for colonoscopy. No longer taking alendronate-ran out and did not refill or ask for refill. Does not want covid booster and did not have second shingles vaccine. No new concerns.    Molly Ross is a 80 y.o. female who presents for annual physical exam, Medicare wellness visit and follow-up on chronic medical conditions.   She was last seen in July with abscess of neck and groin. She reports these have healed up, haven't recurred.  The neck still has a bump, but isn't tender/painful, no drainage.  Osteopenia:  She was started on Alendronate in 02/2020 for osteopenia with elevated FRAX score (4.6% for hip). She tolerated this without any side effects, but stopped taking it when the prescription ran out (after 1 year). She would like to restart this again.  Left breast cancer 02/2018, s/p lumpectomy and radiation. She is on letrozole and doing well, denies side side effects. Plan is letrozole 2.5 mg for 5-7 years (started 05/15/2019). She had developed lymphedema of the breast, had physical therapy.  She hasn't had any further problems. She denies any breast complaints today.  She is due to see Dr. Lindi Adie in April 2023 (last visit 03/2021).  CAD: She was noted to have coronary calcification in 2 vessels on CT scan in 2016, saw Dr. Wynonia Lawman and had a myocardial perfusion scan. She had no evidence of ischemia with an ejection fraction of 76%. She has been compliant with taking statin, and denies side effects.  She is doing well on 53m atorvastatin (had some issues with discomfort in leg on the initial 420mdose). She follows a lowfat, low cholesterol diet. She last saw Dr. MuBettina Gavian 07/2021, reported very rare use of nitroglycerin, felt to have stable angina. She takes it when she feels funny in her jaw. Last used it 3 weeks ago, and the discomfort goes away.  She continues to have DOE,  unchanged, related to her underlying lung disease. She denies any recent changes in her symptoms.  She is due for repeat lipids. She is not fasting today. She admits that she stopped taking her aspirin. (Not exactly sure why--"listening to others", not advised by a doctor to stop). Lab Results  Component Value Date   CHOL 164 09/07/2020   HDL 89 09/07/2020   LDLCALC 65 09/07/2020   TRIG 45 09/07/2020   CHOLHDL 1.8 09/07/2020   Aortic atherosclerosis:  noted on CT of chest in 12/2018.  She is compliant with her statin.  Emphysema and bronchiectasis: Under the care of pulmonologist. Last seen 10/2021, when they switched her from Spiriva and Breo to Stiolto 2 puffs once daily. She reports doing very well/better on the StDarden RestaurantsShe doesn't have much sputum production (what she does get up is greenish-yellow), mainly after laying down. She doesn't require oxygen.  She continues to use flutter valve, but admits that she only uses it once or twice a week (recommended 2-4x/d).  No longer using hypertonic saline nebs.  Hasn't used any albuterol in a couple of years.  Hiatal hernia was noted on CT done by pulm. It was small, but felt it might exacerbate her pulmonary symptoms, so they started her on Prilosec 2052m She is doing well on this. She denies heartburn, belching, or dysphagia.   H/o Vitamin D deficiency: s/p 12 weeks of rx replacement in February 2016.  Last vitamin D level was 52.2  in 12/2017. She is currently taking Ca+D once daily, MVI daily and Vitamin D 1000 IU daily.  Immunization History  Administered Date(s) Administered   Fluad Quad(high Dose 65+) 08/25/2019, 09/07/2020, 10/26/2021   Influenza, High Dose Seasonal PF 01/26/2014, 10/19/2014, 08/30/2015, 08/08/2016, 08/05/2017, 09/02/2018   Moderna Sars-Covid-2 Vaccination 12/29/2019, 01/26/2020   Pneumococcal Conjugate-13 12/25/2015   Pneumococcal Polysaccharide-23 12/30/2016   Tdap 09/03/2017   Zoster Recombinat (Shingrix)  03/25/2021   Had COVID 04/2021, and declined booster at pulm visit last month (and again today) Last Pap smear: 12/2015, normal, no high risk HPV detected Last mammogram: 03/2021 Last colonoscopy:  10/2017; 7 tubular adenomas found, and diverticulosis. Repeat was due 10/2020 (3 yr f/u). Last DEXA: 02/2020 T-2.1 R fem neck, FRAX elevated at 4.6% hip Dentist: twice yearly Ophtho: yearly, due Exercise: Walks 3x/week about 15 minutes (up and down the driveway), on the days she wasn't working, Does some weight-bearing exercise for upper body (body weight--pushing self up on chair).  Patient Care Team: Rita Ohara, MD as PCP - General (Family Medicine) Stark Klein, MD as Consulting Physician (General Surgery) Nicholas Lose, MD as Consulting Physician (Hematology and Oncology) Eppie Gibson, MD as Attending Physician (Radiation Oncology) Gardenia Phlegm, NP as Nurse Practitioner (Hematology and Oncology) Brand Males, MD as Consulting Physician (Pulmonary Disease) Richardo Priest, MD as Consulting Physician (Cardiology) Otis Brace, MD as Consulting Physician (Gastroenterology) Delsa Sale, OD (Optometry) Mincey, Neena Rhymes, MD as Consulting Physician (Ophthalmology) Ulis Rias, DMD (Dentistry) Pulmonary: Dr. Chase Caller Cardiology: Dr. Bettina Gavia Dentist: Dr. Geanie Logan (in [place]) GI:  Dr. Hoy Morn (at [place]) Ophtho: Dr. Teodoro Spray, Dr. Alanda Slim did her cataract surgery  Depression Screening: [place] Office Visit from 12/05/2021 in [place]  PHQ-2 Total Score 0        Falls screen:  Fall Risk  12/05/2021 09/07/2020 04/07/2019 06/16/2018 04/10/2018  Falls in the past year? 0 0 0 No Yes  Number falls in past yr: 0 - - - 1  Injury with Fall? 0 - - - No  Risk for fall due to : No Fall Risks - - - -  Follow up Falls evaluation completed - - - -     Functional Status Survey: Is the patient deaf or have difficulty hearing?:  No Does the patient have difficulty seeing, even when wearing glasses/contacts?: Yes (maybe some minor changes, past due for eye exam-will schedule) Does the patient have difficulty concentrating, remembering, or making decisions?: No Does the patient have difficulty walking or climbing stairs?: No Does the patient have difficulty dressing or bathing?: No Does the patient have difficulty doing errands alone such as visiting a doctor's office or shopping?: No  Mini-Cog Scoring: 5     End of Life Discussion:  Patient has a living will and medical power of attorney (scanned 03/2018).  PMH, PSH, SH and FH were reviewed and updated.  Outpatient Encounter Medications as of 12/05/2021  Medication Sig Note   Ascorbic Acid (VITAMIN C) 100 MG tablet Take 1 tablet (100 mg total) by mouth daily.    atorvastatin (LIPITOR) 20 MG tablet TAKE 1 TABLET BY MOUTH EVERY DAY    Calcium-Magnesium-Vitamin D (CALCIUM 500 PO) Take 1 tablet by mouth daily.    cholecalciferol (VITAMIN D3) 25 MCG (1000 UNIT) tablet Take 1,000 Units by mouth daily.    letrozole (FEMARA) 2.5 MG tablet Take 1 tablet (2.5 mg total) by mouth daily.    Multiple Vitamins-Minerals (MULTIVITAMIN WITH MINERALS) tablet Take 1 tablet  by mouth daily.    omeprazole (PRILOSEC OTC) 20 MG tablet Take 20 mg by mouth daily.    Tiotropium Bromide-Olodaterol (STIOLTO RESPIMAT) 2.5-2.5 MCG/ACT AERS Inhale 2 puffs into the lungs daily.    alendronate (FOSAMAX) 70 MG tablet Take 1 tablet (70 mg total) by mouth every 7 (seven) days. Take with a full glass of water on an empty stomach. (Patient not taking: Reported on 12/05/2021) 12/05/2021: Ran out, never refilled   ipratropium-albuterol (DUONEB) 0.5-2.5 (3) MG/3ML SOLN Take 3 mLs by nebulization every 4 (four) hours as needed. (Patient not taking: Reported on 12/05/2021) 12/05/2021: Has not used in long time   nitroGLYCERIN (NITROSTAT) 0.4 MG SL tablet Place 1 tablet (0.4 mg total) under the tongue every 5  (five) minutes as needed. (Patient not taking: Reported on 12/05/2021) 12/05/2021: Uses prn, last used  within 3 weeks   sodium chloride HYPERTONIC 3 % nebulizer solution Take by nebulization as needed for other. (Patient not taking: Reported on 12/05/2021) 12/05/2021: Has not used in a long time   [DISCONTINUED] aspirin EC 81 MG tablet Take 81 mg by mouth daily.    No facility-administered encounter medications on file as of 12/05/2021.   No Known Allergies   ROS: The patient denies anorexia, fever, headaches, vision changes, decreased hearing, ear pain, sore throat, breast concerns, chest pain, palpitations, dizziness, syncope, dyspnea on exertion, cough, swelling, nausea, vomiting, diarrhea, constipation, abdominal pain, melena, hematochezia, indigestion/heartburn, hematuria, dysuria, vaginal bleeding, discharge, odor or itch, genital lesions, joint pains, numbness, tingling, weakness, tremor, suspicious skin lesions, depression, abnormal bleeding/bruising, or enlarged lymph nodes. Breathing is at baseline/improved.   Cough, stable per HPI. Some leakage of urine with cough/sneeze Had tooth extraction last month; denies any current dental pain.    PHYSICAL EXAM:  BP 122/62    Pulse 76    Ht '5\' 8"'  (1.727 m)    Wt 141 lb 3.2 oz (64 kg)    BMI 21.47 kg/m   Wt Readings from Last 3 Encounters:  12/05/21 141 lb 3.2 oz (64 kg)  10/26/21 142 lb 12.8 oz (64.8 kg)  08/15/21 147 lb 0.6 oz (66.7 kg)    General Appearance:    Alert, cooperative, no distress, appears stated age. Frequent clearing of throat and coughing (productive of gray-green phlegm after laying down. Appears fairly thin, large amount).  Head:    Normocephalic, without obvious abnormality, atraumatic  Eyes:    PERRL, conjunctiva/corneas clear, EOM's intact, fundi benign  Ears:    Normal TM's and external ear canals  Nose:   Not examined, wearing mask due to COVID-19 pandemic  Throat:   Not examined, wearing mask due to  COVID-19 pandemic  Neck:   Supple, no lymphadenopathy;  thyroid:  no enlargement/tenderness/ nodules; no carotid bruit or JVD  Back:    Spine nontender, no curvature, ROM normal, no CVA tenderness  Lungs:     Good air movement. No wheezes, or ronchi. There were crackles and slight decreased air movement on the right.  The air movement improved after coughing up phlegm. Respirations unlabored.   Chest Wall:    No tenderness or deformity   Heart:    Regular rate and rhythm, S1 and S2 normal, no murmur, rub or gallop.   Breast Exam:    No tenderness, masses, or nipple discharge or inversion on the right. WHSS in R upper breast.  Left breast has WHSS, below areola. There is very slight color discoloration of the left breast (slightly yellow, compared to  the right).  There is some tenderness at L UOQ, and also posteriorly to the L breast at 4 o'clock position.  These areas of tenderness are the same as present on exam last year. No axillary lymphadenopathy.  No masses.  Abdomen:     Soft, non-tender, nondistended, normoactive bowel sounds, no masses, no hepatosplenomegaly  Genitalia:    Normal external genitalia without lesions, some atrophy noted.  BUS and vagina normal; no cervical motion tenderness. No abnormal vaginal discharge.  Uterus and adnexa not enlarged, nontender, no masses.  Pap not performed  Rectal:    Normal sphincter tone, no masses.  Heme negative stool  Extremities:   No clubbing, cyanosis. Trace edema at ankles only  Pulses:   2+ and symmetric all extremities  Skin:   Skin color, texture, turgor normal, no lesions. She has many seborrheic keratoses throughout her body many of which are large. There are also many scattered angiomas. Left mid-shin --hyperkeratotic lesion, 4x17m, and slightly gray. (unchanged in size/appearance from last year).  Posterior neck--small scar from I&D of cyst. Residual cyst/induration to the left of the scar, <1cm, nontender, no fluctuance.  Lymph nodes:    Cervical, supraclavicular, inguinal and axillary nodes normal  Neurologic:   Normal strength, sensation and gait; reflexes 2+ and symmetric throughout                                  Psych:   Normal mood, affect, hygiene and grooming    ASSESSMENT/PLAN:  Annual physical exam - Plan: Lipid panel, CBC with Differential/Platelet, Comprehensive metabolic panel  Medicare annual wellness visit, subsequent  Osteopenia of right hip - Disc Ca, D, weight-bearing exercise. Restart weekly alendronate. Repeat DEXA 02/2022 - Plan: DG BONE DENSITY (DXA), alendronate (FOSAMAX) 70 MG tablet  COPD, moderate (HCC) - stable on current regimen per pulm. Productive cough x 2 during visit. Encouraged more frequent use of flutter valve  Bronchiectasis without complication (HCC) - stable on current regimen per pulm  Malignant neoplasm of lower-inner quadrant of left breast in female, estrogen receptor positive ([place] - doing well on letrozole.  Mammo UTD, will f/u with oncologist in April. Persistent tenderness L breast  Vitamin D deficiency - continue current vitamins  Atherosclerosis of aorta ([place] - noted on CT 12/2018. Cont statin - Plan: Lipid panel  Coronary artery disease of native artery of native heart with stable angina pectoris ([place] - stable per cardiologist's last note, rare NTG use. Restart ASA 848mdaily. Further eval needed if changes (pt had declined) - Plan: Lipid panel  Medication monitoring encounter - Plan: Lipid panel, CBC with Differential/Platelet, Comprehensive metabolic panel  High risk for hip fracture - Plan: alendronate (FOSAMAX) 70 MG tablet  Past due in getting 2nd Shingrix, reminded to get. Encouraged bivalent COVID booster, pt declined.  Pt to check with dentist prior to restarting alendronate (due to recent extractions).  Lipid, c-met, cbc--will return for fasting labs. Will need atorvastatin RF when labs are back.   Discussed monthly self breast exams and yearly  mammograms; at least 30 minutes of aerobic activity at least 5 days/week and weight-bearing exercise 2x/week; proper sunscreen use reviewed; healthy diet, including goals of calcium and vitamin D intake and alcohol recommendations (less than or equal to 1 drink/day) reviewed; regular seatbelt use; changing batteries in smoke detectors.  Immunization recommendations discussed--continue yearly Flu shots. COVID bivalent booster strongly encouraged, declined. 2nd Shingrix recommended--to get from pharmacy (  in [place] no OOP cost).  Colonoscopy UTD, repeat due 10/2020.  Pt was reminded to call [facility] to schedule appt   F/u 1 year (AWV/CPE), sooner prn.  Full Code, Full care   Medicare Attestation I have personally reviewed: The patient's medical and social history Their use of alcohol, tobacco or illicit drugs Their current medications and supplements The patient's functional ability including ADLs,fall risks, home safety risks, cognitive, and hearing and visual impairment Diet and physical activities Evidence for depression or mood disorders  The patient's weight, height, BMI have been recorded in the chart.  I have made referrals, counseling, and provided education to the patient based on review of the above and I have provided the patient with a written personalized care plan for preventive services.

## 2021-12-04 NOTE — Patient Instructions (Addendum)
HEALTH MAINTENANCE RECOMMENDATIONS:  It is recommended that you get at least 30 minutes of aerobic exercise at least 5 days/week (for weight loss, you may need as much as 60-90 minutes). This can be any activity that gets your heart rate up. This can be divided in 10-15 minute intervals if needed, but try and build up your endurance at least once a week.  Weight bearing exercise is also recommended twice weekly.  Eat a healthy diet with lots of vegetables, fruits and fiber.  "Colorful" foods have a lot of vitamins (ie green vegetables, tomatoes, red peppers, etc).  Limit sweet tea, regular sodas and alcoholic beverages, all of which has a lot of calories and sugar.  Up to 1 alcoholic drink daily may be beneficial for women (unless trying to lose weight, watch sugars).  Drink a lot of water.  Calcium recommendations are 1200-1500 mg daily (1500 mg for postmenopausal women or women without ovaries), and vitamin D 1000 IU daily.  This should be obtained from diet and/or supplements (vitamins), and calcium should not be taken all at once, but in divided doses.  Monthly self breast exams and yearly mammograms for women over the age of 80 is recommended.  Sunscreen of at least SPF 30 should be used on all sun-exposed parts of the skin when outside between the hours of 10 am and 4 pm (not just when at beach or pool, but even with exercise, golf, tennis, and yard work!)  Use a sunscreen that says "broad spectrum" so it covers both UVA and UVB rays, and make sure to reapply every 1-2 hours.  Remember to change the batteries in your smoke detectors when changing your clock times in the spring and fall. Carbon monoxide detectors are recommended for your home.  Use your seat belt every time you are in a car, and please drive safely and not be distracted with cell phones and texting while driving.   Molly Ross , Thank you for taking time to come for your Medicare Wellness Visit. I appreciate your ongoing  commitment to your health goals. Please review the following plan we discussed and let me know if I can assist you in the future.   This is a list of the screening recommended for you and due dates:  Health Maintenance  Topic Date Due   COVID-19 Vaccine (3 - Moderna risk series) 02/23/2020   Colon Cancer Screening  11/11/2020   Zoster (Shingles) Vaccine (2 of 2) 05/20/2021   Tetanus Vaccine  09/04/2027   Pneumonia Vaccine  Completed   Flu Shot  Completed   DEXA scan (bone density measurement)  Completed   HPV Vaccine  Aged Out   Bivalent COVID booster is recommended.  2nd shingrix vaccine is recommended.  While it was supposed to be given 2 months after the first, it is still worth getting the 2nd one now (and not having to restart the series). I believe there will be no out of pocket cost through the pharmacy if you get this in January.  Any vaccines should be separated by at least 2 weeks from each other.  Please contact [facility] to schedule follow-up (either for colonoscopy, or to discuss whether it is needed).  Your next bone density test is due in March.  I ordered it, but you will need to contact their office to get it scheduled.  You may want to verify with the dentist that it is okay to resume your alendronate (since your extractions done last month).  You can double check with Dr. Bettina Gavia if you prefer, but I believe based on your heart issues, that you should continue to take aspirin 81mg  daily.  This is because you have some known coronary disease (not taking it just for prevention, which is no longer recommended). You may notice more bruising on your arms when you restart the aspirin.

## 2021-12-05 ENCOUNTER — Encounter: Payer: Self-pay | Admitting: Family Medicine

## 2021-12-05 ENCOUNTER — Ambulatory Visit (INDEPENDENT_AMBULATORY_CARE_PROVIDER_SITE_OTHER): Payer: Medicare Other | Admitting: Family Medicine

## 2021-12-05 ENCOUNTER — Other Ambulatory Visit: Payer: Self-pay

## 2021-12-05 VITALS — BP 122/62 | HR 76 | Ht 68.0 in | Wt 141.2 lb

## 2021-12-05 DIAGNOSIS — J479 Bronchiectasis, uncomplicated: Secondary | ICD-10-CM | POA: Diagnosis not present

## 2021-12-05 DIAGNOSIS — E559 Vitamin D deficiency, unspecified: Secondary | ICD-10-CM

## 2021-12-05 DIAGNOSIS — Z5181 Encounter for therapeutic drug level monitoring: Secondary | ICD-10-CM | POA: Diagnosis not present

## 2021-12-05 DIAGNOSIS — Z0000 Encounter for general adult medical examination without abnormal findings: Secondary | ICD-10-CM | POA: Diagnosis not present

## 2021-12-05 DIAGNOSIS — J449 Chronic obstructive pulmonary disease, unspecified: Secondary | ICD-10-CM

## 2021-12-05 DIAGNOSIS — M85851 Other specified disorders of bone density and structure, right thigh: Secondary | ICD-10-CM

## 2021-12-05 DIAGNOSIS — I700 Atherosclerosis of aorta: Secondary | ICD-10-CM

## 2021-12-05 DIAGNOSIS — Z9189 Other specified personal risk factors, not elsewhere classified: Secondary | ICD-10-CM | POA: Diagnosis not present

## 2021-12-05 DIAGNOSIS — Z170 Estrogen receptor positive status [ER+]: Secondary | ICD-10-CM | POA: Diagnosis not present

## 2021-12-05 DIAGNOSIS — C50312 Malignant neoplasm of lower-inner quadrant of left female breast: Secondary | ICD-10-CM

## 2021-12-05 DIAGNOSIS — I25118 Atherosclerotic heart disease of native coronary artery with other forms of angina pectoris: Secondary | ICD-10-CM | POA: Diagnosis not present

## 2021-12-05 MED ORDER — ALENDRONATE SODIUM 70 MG PO TABS
70.0000 mg | ORAL_TABLET | ORAL | 3 refills | Status: DC
Start: 2021-12-05 — End: 2023-01-20

## 2021-12-05 MED ORDER — ASPIRIN 81 MG PO TBEC: 81.0000 mg | DELAYED_RELEASE_TABLET | Freq: Every day | ORAL | 0 refills | Status: AC

## 2021-12-07 ENCOUNTER — Other Ambulatory Visit: Payer: Medicare Other

## 2021-12-14 ENCOUNTER — Other Ambulatory Visit: Payer: Medicare Other

## 2022-01-03 ENCOUNTER — Telehealth: Payer: Self-pay | Admitting: Internal Medicine

## 2022-01-03 MED ORDER — AMOXICILLIN 500 MG PO TABS: 500.0000 mg | ORAL_TABLET | Freq: Three times a day (TID) | ORAL | 0 refills | Status: DC

## 2022-01-03 MED ORDER — PREDNISONE 10 MG PO TABS: ORAL_TABLET | ORAL | 0 refills | Status: DC

## 2022-01-03 NOTE — Telephone Encounter (Signed)
Calling to see if MR has called in any prescriptions for her. She said she has already taken a covid test

## 2022-01-03 NOTE — Telephone Encounter (Signed)
I called and spoke with the pt and notified of response per MR  She verbalized understanding  Already tested neg for covid this am at home  She wants to take meds that MR mentioned  I have sent these to pharm  Pt to call if not improving

## 2022-01-03 NOTE — Telephone Encounter (Signed)
°  She should get covid tested Should consider flu test too and strep test -  Prob urgent care  But If she wants to try something  Amoxicillin 500mg  tid x 5 days (caution - diarrhea) Please take prednisone 40 mg x1 day, then 30 mg x1 day, then 20 mg x1 day, then 10 mg x1 day, and then 5 mg x1 day and stop   No Known Allergies

## 2022-01-03 NOTE — Telephone Encounter (Signed)
Called and spoke with patient. She stated that she developed a severe sore throat last night after getting home from dinner. She went to bed thinking that the sore throat would go away. She woke up this morning and the sore throat is even worse. She is also starting to lose her voice. She developed a productive cough with thick, green phlegm. She denied any worsening SOB or wheezing.   She wants to know if MR could send in something for her to help with the cough and throat pain.   Pharmacy is [facility] on [address].   MR, can you please advise? Thanks!

## 2022-01-04 MED ORDER — PAXLOVID (300/100) 20 X 150 MG & 10 X 100MG PO TBPK: 3.0000 | ORAL_TABLET | Freq: Two times a day (BID) | ORAL | 0 refills | Status: AC

## 2022-01-04 NOTE — Telephone Encounter (Signed)
6 she should stop the amoxicillin but can take the prednisone.  She should now also take PAXLOVID - Paxlovid (nirmatelvir 300/Ritonavir100) - BID x 5 days - for GFR >= 60.  Her recent renal function test is normal    PLEASE CHECK MED LIST for the following issues. Please check the 2 different condition related to concomitant medications in alphabetical order  If Molly Ross with DOB 1941/01/11 is on any of the following Strong CYP3A inhibtors - this patient Molly Ross should withold these concomitant meds so they can start paxlovid stratight away. If taking any of these:  alfuzosin, amiodarone, clozapine, colchicine, dihydroergotamine, dronedarone, ergotamine, flecainide, lovastatin, lurasidone, methylergonovine, midazolam [oral], pethidine, pimozide, propafenone, propoxyphene, quinidine, ranolazine, sildenafil simvastatin, triazolam).   If   Molly Ross  with dob [birth year] Is on any of these other strong CYP3A inducers then starting paxlovid should be delayed and the following meds should wash out first. These are dapalutamide, carbamazepine, phenobarbital, phenytoin, rifampin, [place] wort) - let me know immediately and we should delay starting paxlovid by some days even if he stops these medication.    PLEASE INFORM Molly Ross  OF FOLLOWING SIDE EFFECTS  Side effects - all < 5%  - skin rash (and veyr rare a conditon called TEN) - angiomedia  - myalgia - jaundice - high bP (1%) - loss of taste  - diarrhea - reboud 25%   MEDS   Current Outpatient Medications:    amoxicillin (AMOXIL) 500 MG tablet, Take 1 tablet (500 mg total) by mouth 3 (three) times daily., Disp: 15 tablet, Rfl: 0   predniSONE (DELTASONE) 10 MG tablet, 4 x 1 day, 3 x 1 day, 2 x 1 day, 1 x 1 day 1/2 x 1 day then stop, Disp: 11 tablet, Rfl: 0   alendronate (FOSAMAX) 70 MG tablet, Take 1 tablet (70 mg total) by mouth every 7 (seven) days. Take with a full glass of water on an empty stomach., Disp:  12 tablet, Rfl: 3   Ascorbic Acid (VITAMIN C) 100 MG tablet, Take 1 tablet (100 mg total) by mouth daily., Disp: , Rfl:    aspirin 81 MG EC tablet, Take 1 tablet (81 mg total) by mouth daily. Swallow whole., Disp: 1 tablet, Rfl: 0   atorvastatin (LIPITOR) 20 MG tablet, TAKE 1 TABLET BY MOUTH EVERY DAY, Disp: 90 tablet, Rfl: 0   Calcium-Magnesium-Vitamin D (CALCIUM 500 PO), Take 1 tablet by mouth daily., Disp: , Rfl:    cholecalciferol (VITAMIN D3) 25 MCG (1000 UNIT) tablet, Take 1,000 Units by mouth daily., Disp: , Rfl:    ipratropium-albuterol (DUONEB) 0.5-2.5 (3) MG/3ML SOLN, Take 3 mLs by nebulization every 4 (four) hours as needed. (Patient not taking: Reported on 12/05/2021), Disp: 360 mL, Rfl: 2   letrozole (FEMARA) 2.5 MG tablet, Take 1 tablet (2.5 mg total) by mouth daily., Disp: 90 tablet, Rfl: 3   Multiple Vitamins-Minerals (MULTIVITAMIN WITH MINERALS) tablet, Take 1 tablet by mouth daily., Disp: , Rfl:    nitroGLYCERIN (NITROSTAT) 0.4 MG SL tablet, Place 1 tablet (0.4 mg total) under the tongue every 5 (five) minutes as needed. (Patient not taking: Reported on 12/05/2021), Disp: 25 tablet, Rfl: 6   omeprazole (PRILOSEC OTC) 20 MG tablet, Take 20 mg by mouth daily., Disp: , Rfl:    sodium chloride HYPERTONIC 3 % nebulizer solution, Take by nebulization as needed for other. (Patient not taking: Reported on 12/05/2021), Disp: 750 mL, Rfl: 2   Tiotropium Bromide-Olodaterol (  STIOLTO RESPIMAT) 2.5-2.5 MCG/ACT AERS, Inhale 2 puffs into the lungs daily., Disp: 4 g, Rfl: 5

## 2022-01-04 NOTE — Telephone Encounter (Signed)
Called patient this morning and she informed us that she tested herself for covid today and she tested positive and was wanting to know if the medication you prescribed her earlier this week would work for her. You prescribed:   Amoxicillin 500mg  tid x 5 days (caution - diarrhea) Please take prednisone 40 mg x1 day, then 30 mg x1 day, then 20 mg x1 day, then 10 mg x1 day, and then 5 mg x1 day and stop  Dr Chase Caller please advise

## 2022-01-04 NOTE — Telephone Encounter (Signed)
Called and spoke with patient. She verbalized understanding. Will go ahead and send in Paxlovid for her. She is aware of the side effects.   Nothing further needed at time of call.

## 2022-01-04 NOTE — Addendum Note (Signed)
Addended by: Valerie Salts on: 01/04/2022 05:24 PM   Modules accepted: Orders

## 2022-01-04 NOTE — Telephone Encounter (Signed)
Patient calling because she took a covid test this morning and it was positive. Wants to know if the prescription MR sent in will help with that or does she need something else

## 2022-01-05 ENCOUNTER — Other Ambulatory Visit: Payer: Self-pay | Admitting: Family Medicine

## 2022-01-05 DIAGNOSIS — I700 Atherosclerosis of aorta: Secondary | ICD-10-CM

## 2022-01-30 DIAGNOSIS — H5203 Hypermetropia, bilateral: Secondary | ICD-10-CM | POA: Diagnosis not present

## 2022-01-30 DIAGNOSIS — H18461 Peripheral corneal degeneration, right eye: Secondary | ICD-10-CM | POA: Diagnosis not present

## 2022-02-12 ENCOUNTER — Other Ambulatory Visit: Payer: Self-pay

## 2022-02-12 ENCOUNTER — Encounter: Payer: Self-pay | Admitting: Emergency Medicine

## 2022-02-12 ENCOUNTER — Emergency Department
Admission: EM | Admit: 2022-02-12 | Discharge: 2022-02-12 | Disposition: A | Discharge status: Home / Self Care | Payer: Medicare Other | Source: Home / Self Care | Attending: Family Medicine | Admitting: Family Medicine

## 2022-02-12 ENCOUNTER — Emergency Department (INDEPENDENT_AMBULATORY_CARE_PROVIDER_SITE_OTHER): Payer: Medicare Other

## 2022-02-12 DIAGNOSIS — R0781 Pleurodynia: Secondary | ICD-10-CM | POA: Diagnosis not present

## 2022-02-12 DIAGNOSIS — R0789 Other chest pain: Secondary | ICD-10-CM

## 2022-02-12 DIAGNOSIS — S2241XA Multiple fractures of ribs, right side, initial encounter for closed fracture: Secondary | ICD-10-CM | POA: Diagnosis not present

## 2022-02-12 DIAGNOSIS — Z8709 Personal history of other diseases of the respiratory system: Secondary | ICD-10-CM

## 2022-02-12 DIAGNOSIS — J984 Other disorders of lung: Secondary | ICD-10-CM | POA: Diagnosis not present

## 2022-02-12 DIAGNOSIS — W19XXXA Unspecified fall, initial encounter: Secondary | ICD-10-CM

## 2022-02-12 DIAGNOSIS — J8410 Pulmonary fibrosis, unspecified: Secondary | ICD-10-CM | POA: Diagnosis not present

## 2022-02-12 MED ORDER — OXYCODONE-ACETAMINOPHEN 5-325 MG PO TABS: 1.0000 | ORAL_TABLET | Freq: Four times a day (QID) | ORAL | 0 refills | Status: DC | PRN

## 2022-02-12 NOTE — ED Triage Notes (Signed)
Pt states she fell in her laundry room around 12 today and hit her right side of her back/ribs. States she is having a lot of pain and bruising on that side.

## 2022-02-12 NOTE — Discharge Instructions (Signed)
May take Tylenol, ibuprofen, or naproxen for moderate pain Take oxycodone for severe pain Do not drive on oxycodone.  Oxycodone can cause constipation so consider a stool softener See your doctor in follow-up Do breathing exercises 3 times a day Call right away for any signs of infection, fever, sputum, increasing shortness of breath

## 2022-02-12 NOTE — ED Provider Notes (Signed)
[facility]    CSN: [id number] Arrival date & time: 02/12/22  1538      History   Chief Complaint Chief Complaint  Patient presents with   Rib Injury    HPI Molly Ross is a 81 y.o. female.   HPI  Patient is an 81 year old with history of bronchiectasis and COPD.  Prior smoker.  Also history of coronary artery disease.  She is here because she fell at home today.  She fell backwards and landed across a rod iron planter.  She has bruising on her back.  She has pain in her right ribs, pain with deep breath  Past Medical History:  Diagnosis Date   Atherosclerosis of aorta ([place]) 02/02/2015   Noted on imaging    Bronchiectasis ([place])    Bronchiectasis with (acute) exacerbation ([place]) 06/09/2015   CAD (coronary artery disease), native coronary artery    Coronary calcification noted on CT scan 2016  Negative treadmill myoview 07/20/15 with EF 76%    Cancer ([place])    Cellulitis of left breast 10/01/2018   Colon polyp    COPD, moderate ([place]) 11/06/2015   Coronary artery calcification seen on CT scan    Coronary calcification noted on CT scan 2016  Negative treadmill myoview 07/20/15 with EF 76%    Depressive disorder, not elsewhere classified 12/04/2011   Dyspnea    Emphysema of lung ([place]) 02/02/2015   Family history of lung cancer    Family history of ovarian cancer    Family history of prostate cancer    Genetic testing 03/27/2018   The Common Hereditary Cancer Panel offered by [facility] includes sequencing and/or deletion duplication testing of the following 47 genes: APC, ATM, AXIN2, BARD1, BMPR1A, BRCA1, BRCA2, BRIP1, CDH1, CDKN2A (p14ARF), CDKN2A (p16INK4a), CKD4, CHEK2, CTNNA1, DICER1, EPCAM (Deletion/duplication testing only), GREM1 (promoter region deletion/duplication testing only), KIT, MEN1, MLH1, MSH2, MSH3, MSH6, MU   GERD (gastroesophageal reflux disease)    Hiatal hernia 02/02/2015   small, noted on CT of chest    History of radiation therapy 04/16/18- 05/14/18    42.56 Gy directed to the Left Breast in 16 fractions followed by a boost of 8 Gy in 4 fractions   History of smoking 25-50 pack years 06/09/2015   Malignant neoplasm of lower-inner quadrant of left breast in female, 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   Osteopenia 02/18/2018   DEXA 02/2018, T-1.8 R femoral neck   Personal history of radiation therapy 2019   Pleuritic pain 10/01/2018   Shingles 1/09, 07/2010, 11/2017   Unspecified vitamin D deficiency 02/2011 and 2016   Vitamin D deficiency 02/14/2011    Patient Active Problem List   Diagnosis Date Noted   Shingles    History of radiation therapy    GERD (gastroesophageal reflux disease)    Dyspnea    Colon polyp    Cancer ([place])    CAD (coronary artery disease), native coronary artery    Bronchiectasis ([place])    Senile purpura ([place]) 08/25/2019   Hyperlipidemia 02/09/2019   Cellulitis of left breast 10/01/2018   Pleuritic pain 10/01/2018   Genetic testing 03/27/2018   Family history of ovarian cancer    Family history of lung cancer    Family history of prostate cancer    Malignant neoplasm of lower-inner quadrant of left breast in female, 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   Osteopenia 02/18/2018   Personal history of radiation therapy 2019   COPD, moderate ([place]) 11/06/2015   Coronary artery calcification  seen on CT scan    Bronchiectasis with (acute) exacerbation ([place]) 06/09/2015   History of smoking 25-50 pack years 06/09/2015   Atherosclerosis of aorta ([place]) 02/02/2015   Hiatal hernia 02/02/2015   Emphysema of lung ([place]) 02/02/2015   Depressive disorder, not elsewhere classified 12/04/2011   Vitamin D deficiency 02/14/2011    Past Surgical History:  Procedure Laterality Date   BREAST EXCISIONAL BIOPSY Right    benign years ago   BREAST LUMPECTOMY Left 2019   BREAST LUMPECTOMY WITH RADIOACTIVE SEED AND SENTINEL LYMPH NODE BIOPSY Left 03/19/2018   Procedure: BREAST LUMPECTOMY WITH RADIOACTIVE SEED AND  SENTINEL LYMPH NODE BIOPSY;  Surgeon: Stark Klein, MD;  Location: [facility];  Service: General;  Laterality: Left;   CATARACT EXTRACTION, BILATERAL Bilateral 03/2017, 04/2017   [facility], Dr. Alanda Slim   SHOULDER SURGERY  03/2011   for frozen shoulder, and cyst removal (Dr. Percell Miller)   TONSILLECTOMY  child   VIDEO BRONCHOSCOPY Bilateral 06/21/2016   Procedure: VIDEO BRONCHOSCOPY WITHOUT FLUORO;  Surgeon: Brand Males, MD;  Location: [facility];  Service: Cardiopulmonary;  Laterality: Bilateral;    OB History     Gravida  2   Para      Term      Preterm      AB  1   Living  1      SAB  1   IAB      Ectopic      Multiple      Live Births               Home Medications    Prior to Admission medications   Medication Sig Start Date End Date Taking? Authorizing Provider  oxyCODONE-acetaminophen (PERCOCET/ROXICET) 5-325 MG tablet Take 1 tablet by mouth every 6 (six) hours as needed for severe pain. 02/12/22  Yes Raylene Everts, MD  alendronate (FOSAMAX) 70 MG tablet Take 1 tablet (70 mg total) by mouth every 7 (seven) days. Take with a full glass of water on an empty stomach. 12/05/21   Rita Ohara, MD  Ascorbic Acid (VITAMIN C) 100 MG tablet Take 1 tablet (100 mg total) by mouth daily. 04/01/18   Nicholas Lose, MD  aspirin 81 MG EC tablet Take 1 tablet (81 mg total) by mouth daily. Swallow whole. 12/05/21   Rita Ohara, MD  atorvastatin (LIPITOR) 20 MG tablet TAKE 1 TABLET BY MOUTH EVERY DAY 01/07/22   Rita Ohara, MD  Calcium-Magnesium-Vitamin D (CALCIUM 500 PO) Take 1 tablet by mouth daily.    [provider]  cholecalciferol (VITAMIN D3) 25 MCG (1000 UNIT) tablet Take 1,000 Units by mouth daily.    [provider]  letrozole (FEMARA) 2.5 MG tablet Take 1 tablet (2.5 mg total) by mouth daily. 04/11/21   Nicholas Lose, MD  Multiple Vitamins-Minerals (MULTIVITAMIN WITH MINERALS) tablet Take 1 tablet by mouth daily.    [provider]  nitroGLYCERIN (NITROSTAT) 0.4 MG SL tablet Place 1 tablet (0.4 mg total) under the tongue every 5 (five) minutes as needed. Patient not taking: Reported on 12/05/2021 02/12/21 08/15/21  Richardo Priest, MD  omeprazole (PRILOSEC OTC) 20 MG tablet Take 20 mg by mouth daily.    [provider]  Tiotropium Bromide-Olodaterol (STIOLTO RESPIMAT) 2.5-2.5 MCG/ACT AERS Inhale 2 puffs into the lungs daily. 10/26/21   Brand Males, MD    Family History Family History  Problem Relation Age of Onset   Stroke Mother    Diabetes Mother  Stroke Sister 74       brainstem stroke   Diabetes Sister    Seizures Sister    Lung cancer Father 62   Ovarian cancer Sister 33       metastatic to lung, liver   Cancer Sister    Ovarian cancer Maternal Aunt 95   Heart disease Maternal Grandmother    Heart disease Paternal Grandmother    Prostate cancer Cousin    Lung cancer Cousin        recurrent, now stage 4   Breast cancer Neg Hx     Social History Social History   Tobacco Use   Smoking status: Former    Packs/day: 2.00    Years: 24.00    Pack years: 48.00    Types: Cigarettes    Quit date: 06/28/1980    Years since quitting: 41.6   Smokeless tobacco: Never   Tobacco comments:    smoked 1-2 PPD x 24 years;  Vaping Use   Vaping Use: Never used  Substance Use Topics   Alcohol use: Yes    Alcohol/week: 0.0 standard drinks    Comment: 1 glass of wine once a week or less   Drug use: No     Allergies   Patient has no known allergies.   Review of Systems Review of Systems See HPI  Physical Exam Triage Vital Signs ED Triage Vitals  Enc Vitals Group     BP 02/12/22 1556 (!) 163/74     Pulse Rate 02/12/22 1556 88     Resp 02/12/22 1556 16     Temp 02/12/22 1556 98.2 F (36.8 C)     Temp Source 02/12/22 1556 Oral     SpO2 02/12/22 1556 98 %     Weight --      Height --      Head Circumference --      Peak Flow --      Pain Score 02/12/22 1558 8      Pain Loc --      Pain Edu? --      Excl. in [place]? --    No data found.  Updated Vital Signs BP (!) 163/74 (BP Location: Right Arm) Comment: pt denies hypertension, feels this is from pain   Pulse 88    Temp 98.2 F (36.8 C) (Oral)    Resp 16    SpO2 98%      Physical Exam Constitutional:      General: She is in acute distress.     Appearance: She is well-developed and normal weight.  HENT:     Head: Normocephalic and atraumatic.     Mouth/Throat:     Comments: Mask is in place Eyes:     Conjunctiva/sclera: Conjunctivae normal.     Pupils: Pupils are equal, round, and reactive to light.  Cardiovascular:     Rate and Rhythm: Normal rate.  Pulmonary:     Effort: Pulmonary effort is normal. No respiratory distress.    Abdominal:     General: There is no distension.     Palpations: Abdomen is soft.  Musculoskeletal:        General: Normal range of motion.     Cervical back: Normal range of motion.  Skin:    General: Skin is warm and dry.  Neurological:     Mental Status: She is alert.     Gait: Gait normal.  Psychiatric:        Mood and Affect: Mood normal.  Behavior: Behavior normal.     UC Treatments / Results  Labs (all labs ordered are listed, but only abnormal results are displayed) Labs Reviewed - No data to display  EKG   Radiology DG Ribs Unilateral W/Chest Right  Result Date: 02/12/2022 CLINICAL DATA:  [address] and hit right-side of chest at noon today, pain EXAM: RIGHT RIBS AND CHEST - 3+ VIEW COMPARISON:  08/21/2020 FINDINGS: Frontal view of the chest as well as frontal and frogleg lateral views of the right thoracic cage are obtained. Cardiac silhouette is unremarkable. Background scarring and fibrosis again noted without airspace disease, effusion, or pneumothorax. There are minimally displaced right anterolateral ninth and tenth rib fractures, best seen on the oblique projections. No other acute bony abnormalities. IMPRESSION: 1. Minimally displaced  right anterolateral ninth and tenth rib fractures. 2. No effusion or pneumothorax. 3. Stable parenchymal lung scarring and fibrosis. Electronically Signed   By: Randa Ngo M.D.   On: 02/12/2022 16:22    Procedures Procedures (including critical care time)  Medications Ordered in UC Medications - No data to display  Initial Impression / Assessment and Plan / UC Course  I have reviewed the triage vital signs and the nursing notes.  Pertinent labs & imaging results that were available during my care of the patient were reviewed by me and considered in my medical decision making (see chart for details).     Discussed with underlying lung disease the importance ofPreventing pneumonia.  Incentive spirometer is given to her with demonstration on how to use.  Reviewed why she is not being given a rib belt.  She needs to follow-up with her usual provider next week for repeat lung exam Final Clinical Impressions(s) / UC Diagnoses   Final diagnoses:  Closed fracture of multiple ribs of right side, initial encounter  Fall at home, initial encounter  History of bronchiectasis     Discharge Instructions      May take Tylenol, ibuprofen, or naproxen for moderate pain Take oxycodone for severe pain Do not drive on oxycodone.  Oxycodone can cause constipation so consider a stool softener See your doctor in follow-up Do breathing exercises 3 times a day Call right away for any signs of infection, fever, sputum, increasing shortness of breath   ED Prescriptions     Medication Sig Dispense Auth. Provider   oxyCODONE-acetaminophen (PERCOCET/ROXICET) 5-325 MG tablet Take 1 tablet by mouth every 6 (six) hours as needed for severe pain. 20 tablet Raylene Everts, MD      I have reviewed the PDMP during this encounter.   Raylene Everts, MD 02/12/22 [phone]

## 2022-02-14 ENCOUNTER — Telehealth: Payer: Self-pay | Admitting: Internal Medicine

## 2022-02-14 MED ORDER — CEPHALEXIN 500 MG PO CAPS: 500.0000 mg | ORAL_CAPSULE | Freq: Three times a day (TID) | ORAL | 0 refills | Status: AC

## 2022-02-14 NOTE — Telephone Encounter (Signed)
Spoke with pt who states on Tuesday she fell and broke several ribs. Pt now c/o feeling like she can not effectively cough r/t increased pain. Pt did state that when she does cough green phlegm is produced. Pt does states she is currently taking pain meds provided at time of fall, using incentive spirometer x 3 times daily and sleeping in recliner. Pt is concerned about pneumonia and is wondering if there is anything else she should be doing. Pt  does state that she is not currently taking Stiolto. Dr. Chase Caller please advise.  ?

## 2022-02-14 NOTE — Telephone Encounter (Signed)
Her concern was already addressed by office. ?

## 2022-02-14 NOTE — Telephone Encounter (Signed)
Called and spoke with patient. She verbalized understanding of recommendations. She is aware to call us if the abx are not helping.  ? ?RX has been sent to her pharmacy.  ? ?Nothing further needed at time of call.  ?

## 2022-02-14 NOTE — Telephone Encounter (Signed)
Definitely with rib fractures there is always concern for pneumonia.  Pain and splinting this increase the risk for pneumonia.  If she gets pneumonia to be classic fever with more sputum.  Current green sputum I do not know if it is from bronchiectasis or emerging infection ? ?Better to be safe and take antibiotics ? ?Plan ?- cephalexin 500mg  three times daily x  5 days -however if she is not improving or getting worse she should definitely call us or come in and be seen acutely ?-If nurse practitioner has opening Friday, 02/15/2022 or Monday, 02/18/2022 she could be seen ? ? ? ? ? ? ?No Known Allergies ? ?

## 2022-02-20 DIAGNOSIS — M545 Low back pain, unspecified: Secondary | ICD-10-CM | POA: Diagnosis not present

## 2022-03-14 ENCOUNTER — Ambulatory Visit: Payer: Medicare Other | Admitting: Adult Health

## 2022-03-14 ENCOUNTER — Ambulatory Visit (INDEPENDENT_AMBULATORY_CARE_PROVIDER_SITE_OTHER): Payer: Medicare Other

## 2022-03-14 ENCOUNTER — Encounter: Payer: Self-pay | Admitting: Adult Health

## 2022-03-14 VITALS — BP 100/50 | HR 83 | Temp 98.2°F | Ht 68.0 in | Wt 139.8 lb

## 2022-03-14 DIAGNOSIS — S299XXA Unspecified injury of thorax, initial encounter: Secondary | ICD-10-CM | POA: Diagnosis not present

## 2022-03-14 DIAGNOSIS — J479 Bronchiectasis, uncomplicated: Secondary | ICD-10-CM

## 2022-03-14 DIAGNOSIS — J449 Chronic obstructive pulmonary disease, unspecified: Secondary | ICD-10-CM | POA: Diagnosis not present

## 2022-03-14 DIAGNOSIS — S2231XA Fracture of one rib, right side, initial encounter for closed fracture: Secondary | ICD-10-CM | POA: Diagnosis not present

## 2022-03-14 NOTE — Addendum Note (Signed)
Addended by: Vanessa Darlina on: 03/14/2022 05:36 PM   Modules accepted: Orders

## 2022-03-14 NOTE — Assessment & Plan Note (Signed)
Appears compensated however with recent rib fractures advised to continue with pulmonary hygiene regimen.  To slowly start back on flutter valve as able. ? ?Plan  ?Patient Instructions  ?Continue on Stiolto 2 puffs daily  ?Mucinex Twice daily  As needed  cough/congestion  ?Activity as tolerated.  ?Restart Flutter valve Three times a day  as able  ?Chest xray today .  ?Warm heat to ribs As needed   ?Follow up with Dr. Chase Caller in 4 months with PFT  ?Please contact office for sooner follow up if symptoms do not improve or worsen or seek emergency care  ? ?  ? ?

## 2022-03-14 NOTE — Progress Notes (Signed)
? ?'@Patient'  ID: Molly Ross, female    DOB: [birth year], 81 y.o.   MRN: [id number] ? ?Chief Complaint  ?Patient presents with  ? Follow-up  ? ? ?Referring provider: ?Rita Ohara, MD ? ?HPI: ?81 year old female former smoker father for COPD with emphysema and bronchiectasis, abnormal CT suggestive of MAI.  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 ? ?TEST/EVENTS :  ?10/30/2015) baseline and well. Post broncho-dilator FEV1 1.36 L/51% and a ratio 60, total lung capacity 5.2 L/90%, DLCO 16/51% ? ?03/14/2022 Follow up : Bronchiectasis  and COPD  ?Returns for 4 month follow up . Says was doing better until 4 weeks ago , she fell at home with right sided rib fractures. Went to ER , Chest xray showed minimally displaced right 9/10 rib fractures. Says very sore , sleeping in recliner. No longer taking pain meds. Cough was increased until this week. Worried that she still has pain.Bruising has resolved but still very sore in right ribs and back.  ? ?Remains on Stiolto  daily . Uses flutter valve but not able to last few weeks. Not using nebs currently . Has started incentive spirometry . Had Covid 19 infection in January 2023 . Improved with Paxlovid. Cough is at baseline .No fever, hemoptysis , weight loss.  ?Appetite is fair.  ? ? ? ? ?No Known Allergies ? ?Immunization History  ?Administered Date(s) Administered  ? Fluad Quad(high Dose 65+) 08/25/2019, 09/07/2020, 10/26/2021  ? Influenza, High Dose Seasonal PF 01/26/2014, 10/19/2014, 08/30/2015, 08/08/2016, 08/05/2017, 09/02/2018  ? Moderna Sars-Covid-2 Vaccination 12/29/2019, 01/26/2020  ? Pneumococcal Conjugate-13 12/25/2015  ? Pneumococcal Polysaccharide-23 12/30/2016  ? Tdap 09/03/2017  ? Zoster Recombinat (Shingrix) 03/25/2021  ? ? ?Past Medical History:  ?Diagnosis Date  ? Atherosclerosis of aorta ([place]) 02/02/2015  ? Noted on imaging   ?  Bronchiectasis ([place])   ? Bronchiectasis with (acute) exacerbation ([place]) 06/09/2015  ? CAD (coronary artery disease), native coronary artery   ? Coronary calcification noted on CT scan 2016  Negative treadmill myoview 07/20/15 with EF 76%   ? Cancer [facility])   ? Cellulitis of left breast 10/01/2018  ? Colon polyp   ? COPD, moderate ([place]) 11/06/2015  ? Coronary artery calcification seen on CT scan   ? Coronary calcification noted on CT scan 2016  Negative treadmill myoview 07/20/15 with EF 76%   ? Depressive disorder, not elsewhere classified 12/04/2011  ? Dyspnea   ? Emphysema of lung ([place]) 02/02/2015  ? Family history of lung cancer   ? Family history of ovarian cancer   ? Family history of prostate cancer   ? Genetic testing 03/27/2018  ? The Common Hereditary Cancer Panel offered by [facility] includes sequencing and/or deletion duplication testing of the following 47 genes: APC, ATM, AXIN2, BARD1, BMPR1A, BRCA1, BRCA2, BRIP1, CDH1, CDKN2A (p14ARF), CDKN2A (p16INK4a), CKD4, CHEK2, CTNNA1, DICER1, EPCAM (Deletion/duplication testing only), GREM1 (promoter region deletion/duplication testing only), KIT, MEN1, MLH1, MSH2, MSH3, MSH6, MU  ? GERD (gastroesophageal reflux disease)   ? Hiatal hernia 02/02/2015  ? small, noted on CT of chest   ? History of radiation therapy 04/16/18- 05/14/18  ? 42.56 Gy directed to the Left Breast in 16 fractions followed by a boost of 8 Gy in 4 fractions  ? History of smoking 25-50 pack years 06/09/2015  ? Malignant neoplasm of lower-inner quadrant of left breast in female, estrogen receptor positive ([place]) 03/06/2018  ? Osteopenia 02/18/2018  ?  DEXA 02/2018, T-1.8 R femoral neck  ? Personal history of radiation therapy 2019  ? Pleuritic pain 10/01/2018  ? Shingles 1/09, 07/2010, 11/2017  ? Unspecified vitamin D deficiency 02/2011 and 2016  ? Vitamin D deficiency 02/14/2011  ? ? ?Tobacco History: ?Social History  ? ?Tobacco Use  ?Smoking Status Former  ? Packs/day: 2.00  ? Years: 24.00  ? Pack years: 48.00  ?  Types: Cigarettes  ? Quit date: 06/28/1980  ? Years since quitting: 41.7  ?Smokeless Tobacco Never  ?Tobacco Comments  ? smoked 1-2 PPD x 24 years;  ? ?Counseling given: Not Answered ?Tobacco comments: smoked 1-2 PPD x 24 years; ? ? ?Outpatient Medications Prior to Visit  ?Medication Sig Dispense Refill  ? alendronate (FOSAMAX) 70 MG tablet Take 1 tablet (70 mg total) by mouth every 7 (seven) days. Take with a full glass of water on an empty stomach. 12 tablet 3  ? Ascorbic Acid (VITAMIN C) 100 MG tablet Take 1 tablet (100 mg total) by mouth daily.    ? aspirin 81 MG EC tablet Take 1 tablet (81 mg total) by mouth daily. Swallow whole. 1 tablet 0  ? atorvastatin (LIPITOR) 20 MG tablet TAKE 1 TABLET BY MOUTH EVERY DAY 90 tablet 2  ? Calcium-Magnesium-Vitamin D (CALCIUM 500 PO) Take 1 tablet by mouth daily.    ? cholecalciferol (VITAMIN D3) 25 MCG (1000 UNIT) tablet Take 1,000 Units by mouth daily.    ? letrozole (FEMARA) 2.5 MG tablet Take 1 tablet (2.5 mg total) by mouth daily. 90 tablet 3  ? Multiple Vitamins-Minerals (MULTIVITAMIN WITH MINERALS) tablet Take 1 tablet by mouth daily.    ? omeprazole (PRILOSEC OTC) 20 MG tablet Take 20 mg by mouth daily.    ? Tiotropium Bromide-Olodaterol (STIOLTO RESPIMAT) 2.5-2.5 MCG/ACT AERS Inhale 2 puffs into the lungs daily. 4 g 5  ? nitroGLYCERIN (NITROSTAT) 0.4 MG SL tablet Place 1 tablet (0.4 mg total) under the tongue every 5 (five) minutes as needed. (Patient not taking: Reported on 12/05/2021) 25 tablet 6  ? oxyCODONE-acetaminophen (PERCOCET/ROXICET) 5-325 MG tablet Take 1 tablet by mouth every 6 (six) hours as needed for severe pain. (Patient not taking: Reported on 03/14/2022) 20 tablet 0  ? ?No facility-administered medications prior to visit.  ? ? ? ?Review of Systems:  ? ?Constitutional:   No  weight loss, night sweats,  Fevers, chills, fatigue, or  lassitude. ? ?HEENT:   No headaches,  Difficulty swallowing,  Tooth/dental problems, or  Sore throat,  ?              No  sneezing, itching, ear ache, nasal congestion, post nasal drip,  ? ?CV:  No chest pain,  Orthopnea, PND, swelling in lower extremities, anasarca, dizziness, palpitations, syncope.  ? ?GI  No heartburn, indigestion, abdominal pain, nausea, vomiting, diarrhea, change in bowel habits, loss of appetite, bloody stools.  ? ?Resp: No shortness of breath with exertion or at rest.  No excess mucus, no productive cough,  No non-productive cough,  No coughing up of blood.  No change in color of mucus.  No wheezing.  No chest wall deformity ? ?Skin: no rash or lesions. ? ?GU: no dysuria, change in color of urine, no urgency or frequency.  No flank pain, no hematuria  ? ?MS:  No joint pain or swelling.  No decreased range of motion.  No back pain. ? ? ? ?Physical Exam ? ?BP (!) 100/50 (BP Location: Right Arm, Patient Position: Sitting, Cuff  Size: Normal)   Pulse 83   Temp 98.2 ?F (36.8 ?C) (Oral)   Ht '5\' 8"'  (1.727 m)   Wt 139 lb 12.8 oz (63.4 kg)   SpO2 98%   BMI 21.26 kg/m?  ? ?GEN: A/Ox3; pleasant , NAD, well nourished  ?  ?HEENT:  [place]/AT,  EACs-clear, TMs-wnl, NOSE-clear, THROAT-clear, no lesions, no postnasal drip or exudate noted.  ? ?NECK:  Supple w/ fair ROM; no JVD; normal carotid impulses w/o bruits; no thyromegaly or nodules palpated; no lymphadenopathy.   ? ?RESP  Clear  P & A; w/o, wheezes/ rales/ or rhonchi. no accessory muscle use, no dullness to percussion ? ?Ross:  RRR, no m/r/g, no peripheral edema, pulses intact, no cyanosis or clubbing. ? ?GI:   Soft & nt; nml bowel sounds; no organomegaly or masses detected.  ? ?Musco: Warm bil, no deformities or joint swelling noted.  ? ?Neuro: alert, no focal deficits noted.   ? ?Skin: Warm, no lesions or rashes ? ? ? ?Lab Results: ? ?CBC ?   ?Component Value Date/Time  ? WBC 7.9 08/21/2020 1109  ? RBC 4.29 08/21/2020 1109  ? HGB 12.6 08/21/2020 1109  ? HGB 12.1 08/19/2019 0857  ? HCT 39.7 08/21/2020 1109  ? HCT 37.2 08/19/2019 0857  ? PLT 280 08/21/2020 1109  ? PLT  333 08/19/2019 0857  ? MCV 92.5 08/21/2020 1109  ? MCV 90 08/19/2019 0857  ? MCH 29.4 08/21/2020 1109  ? MCHC 31.7 08/21/2020 1109  ? RDW 13.7 08/21/2020 1109  ? RDW 11.9 08/19/2019 0857  ? LYMPHSABS 1.3 09/06/2

## 2022-03-14 NOTE — Assessment & Plan Note (Signed)
Right-sided rib injury with fall.  Patient had and 10th rib fractures.  Patient is slowly healing.  Advised her to use Tylenol and warm heat as needed.  We will check chest x-ray today.   ? ?Plan  ?Patient Instructions  ?Continue on Stiolto 2 puffs daily  ?Mucinex Twice daily  As needed  cough/congestion  ?Activity as tolerated.  ?Restart Flutter valve Three times a day  as able  ?Chest xray today .  ?Warm heat to ribs As needed   ?Follow up with Dr. Chase Caller in 4 months with PFT  ?Please contact office for sooner follow up if symptoms do not improve or worsen or seek emergency care  ? ?  ? ?

## 2022-03-14 NOTE — Patient Instructions (Addendum)
Continue on Stiolto 2 puffs daily  ?Mucinex Twice daily  As needed  cough/congestion  ?Activity as tolerated.  ?Restart Flutter valve Three times a day  as able  ?Chest xray today .  ?Warm heat to ribs As needed   ?Follow up with Dr. Chase Caller in 4 months with PFT  ?Please contact office for sooner follow up if symptoms do not improve or worsen or seek emergency care  ? ?

## 2022-03-14 NOTE — Assessment & Plan Note (Signed)
Continue on current regimen.  Check chest x-ray today.  Mucociliary clearance regimen ? ?Plan  ?Patient Instructions  ?Continue on Stiolto 2 puffs daily  ?Mucinex Twice daily  As needed  cough/congestion  ?Activity as tolerated.  ?Restart Flutter valve Three times a day  as able  ?Chest xray today .  ?Warm heat to ribs As needed   ?Follow up with Dr. Chase Caller in 4 months with PFT  ?Please contact office for sooner follow up if symptoms do not improve or worsen or seek emergency care  ? ?  ? ?

## 2022-03-21 NOTE — Progress Notes (Signed)
Called and spoke with patient, provided the results/recommendations per Rexene Edison NP.  She verbalized understanding.  Nothing further needed.

## 2022-04-11 NOTE — Progress Notes (Incomplete)
? ?Patient Care Team: ?Rita Ohara, MD as PCP - General (Family Medicine) ?Stark Klein, MD as Consulting Physician (General Surgery) ?Nicholas Lose, MD as Consulting Physician (Hematology and Oncology) ?Eppie Gibson, MD as Attending Physician (Radiation Oncology) ?Gardenia Phlegm, NP as Nurse Practitioner (Hematology and Oncology) ?Brand Males, MD as Consulting Physician (Pulmonary Disease) ?Richardo Priest, MD as Consulting Physician (Cardiology) ?Otis Brace, MD as Consulting Physician (Gastroenterology) ?Delsa Sale, OD (Optometry) ?Awanda Mink, MD as Consulting Physician (Ophthalmology) ?Ulis Rias, DMD (Dentistry) ? ?DIAGNOSIS: No diagnosis found. ? ?SUMMARY OF ONCOLOGIC HISTORY: ?Oncology History  ?Malignant neoplasm of lower-inner quadrant of left breast in female, estrogen receptor positive ([place])  ?03/06/2018 Initial Diagnosis  ? 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 ?  ?03/19/2018 Surgery  ? Left Lumpectomy: IDC Grade 1, 1.3 cm, 1/3 LN Positive, with LVI, T1cN1 ER 100%, PR 90%, Ki-67 5%, HER-2 negative Stage 1B ? ?  ?03/21/2018 Genetic Testing  ? The Common Hereditary Cancer Panel offered by [facility] includes sequencing and/or deletion duplication testing of the following 47 genes: APC, ATM, AXIN2, BARD1, BMPR1A, BRCA1, BRCA2, BRIP1, CDH1, CDKN2A (p14ARF), CDKN2A (p16INK4a), CKD4, CHEK2, CTNNA1, DICER1, EPCAM (Deletion/duplication testing only), GREM1 (promoter region deletion/duplication testing only), KIT, MEN1, MLH1, MSH2, MSH3, MSH6, MUTYH, NBN, NF1, NHTL1, PALB2, PDGFRA, PMS2, POLD1, POLE, PTEN, RAD50, RAD51C, RAD51D, SDHB, SDHC, SDHD, SMAD4, SMARCA4. STK11, TP53, TSC1, TSC2, and VHL.  The following genes were evaluated for sequence changes only: SDHA and HOXB13  c.251G>A variant only. ? ?Results: No pathogenic variants identified.   A variant of uncertain significance in the gene SMARCA4 was identified c.5011C>T ([id number]).  The date of this test report is 03/21/2018 ?  ?04/10/2018 Cancer Staging  ? Staging form: Breast, AJCC 8th Edition ?- Pathologic: Stage IA (pT1c, pN1a, cM0, G1, ER+, PR+, HER2-) - Signed by Eppie Gibson, MD on 04/10/2018 ? ?  ?04/16/2018 - 05/14/2018 Radiation Therapy  ? Adjuvant radiation therapy ? ?  ?05/2018 -  Anti-estrogen oral therapy  ? Letrozole daily ? ?  ? ? ?CHIEF COMPLIANT: Follow-up on antiestrogen therapy with letrozole ? ?INTERVAL HISTORY: Molly Ross is a ?81 y.o. with above-mentioned history of left breast cancer treated with lumpectomy, radiation, and who is currently on anti-estrogen therapy with letrozole. She presents to the clinic today for a follow-up.   ? ? ? ?ALLERGIES:  has No Known Allergies. ? ?MEDICATIONS:  ?Current Outpatient Medications  ?Medication Sig Dispense Refill  ? alendronate (FOSAMAX) 70 MG tablet Take 1 tablet (70 mg total) by mouth every 7 (seven) days. Take with a full glass of water on an empty stomach. 12 tablet 3  ? Ascorbic Acid (VITAMIN C) 100 MG tablet Take 1 tablet (100 mg total) by mouth daily.    ? aspirin 81 MG EC tablet Take 1 tablet (81 mg total) by mouth daily. Swallow whole. 1 tablet 0  ? atorvastatin (LIPITOR) 20 MG tablet TAKE 1 TABLET BY MOUTH EVERY DAY 90 tablet 2  ? Calcium-Magnesium-Vitamin D (CALCIUM 500 PO) Take 1 tablet by mouth daily.    ? cholecalciferol (VITAMIN D3) 25 MCG (1000 UNIT) tablet Take 1,000 Units by mouth daily.    ? letrozole (FEMARA) 2.5 MG tablet Take 1 tablet (2.5 mg total) by mouth daily. 90 tablet 3  ?  Multiple Vitamins-Minerals (MULTIVITAMIN WITH MINERALS) tablet Take 1 tablet by mouth daily.    ? nitroGLYCERIN (NITROSTAT) 0.4 MG SL tablet Place 1 tablet (0.4 mg total) under the tongue every 5 (five) minutes as needed. (Patient not taking: Reported on 12/05/2021) 25  tablet 6  ? omeprazole (PRILOSEC OTC) 20 MG tablet Take 20 mg by mouth daily.    ? Tiotropium Bromide-Olodaterol (STIOLTO RESPIMAT) 2.5-2.5 MCG/ACT AERS Inhale 2 puffs into the lungs daily. 4 g 5  ? ?No current facility-administered medications for this visit.  ? ? ?PHYSICAL EXAMINATION: ?ECOG PERFORMANCE STATUS: {CHL ONC ECOG YS:[id number]} ? ?There were no vitals filed for this visit. ?There were no vitals filed for this visit. ? ?BREAST:*** No palpable masses or nodules in either right or left breasts. No palpable axillary supraclavicular or infraclavicular adenopathy no breast tenderness or nipple discharge. (exam performed in the presence of a chaperone) ? ?LABORATORY DATA:  ?I have reviewed the data as listed ? ?  Latest Ref Rng & Units 09/07/2020  ? 11:05 AM 08/21/2020  ? 11:09 AM 08/19/2019  ?  8:57 AM  ?CMP  ?Glucose 65 - 99 mg/dL 79   95   82    ?BUN 8 - 27 mg/dL _0 ?Creatinine 0.57 - 1.00 mg/dL 0.54   0.42   0.57    ?Sodium 134 - 144 mmol/L 142   139   140    ?Potassium 3.5 - 5.2 mmol/L 4.5   4.2   5.4    ?Chloride 96 - 106 mmol/L 101   101   101    ?CO2 20 - 29 mmol/L _1 ?Calcium 8.7 - 10.3 mg/dL 10.1   9.4   10.2    ?Total Protein 6.0 - 8.5 g/dL 7.5    6.8    ?Total Bilirubin 0.0 - 1.2 mg/dL 1.2    0.6    ?Alkaline Phos 44 - 121 IU/L 112    100    ?AST 0 - 40 IU/L 18    20    ?ALT 0 - 32 IU/L 10    12    ? ? ?Lab Results  ?Component Value Date  ? WBC 7.9 08/21/2020  ? HGB 12.6 08/21/2020  ? HCT 39.7 08/21/2020  ? MCV 92.5 08/21/2020  ? PLT 280 08/21/2020  ? NEUTROABS 5.5 08/21/2020  ? ? ?ASSESSMENT & PLAN:  ?No problem-specific Assessment & Plan notes found for this encounter. ? ? ? ?No orders of the defined types were placed in this encounter. ? ?The patient has a good understanding of the overall plan. she agrees with it. she will call with any problems that may develop before the next visit here. ?Total time spent: 30 mins including face to face time and time spent for planning,  charting and co-ordination of care ? ? Suzzette Righter, CMA ?04/11/22 ? ? ? I Gardiner Coins am scribing for Dr. Lindi Adie ? ?***  ?

## 2022-04-25 ENCOUNTER — Inpatient Hospital Stay: Payer: Medicare Other | Admitting: Hematology and Oncology

## 2022-05-09 ENCOUNTER — Other Ambulatory Visit: Payer: Self-pay | Admitting: Hematology and Oncology

## 2022-05-28 ENCOUNTER — Other Ambulatory Visit: Payer: Medicare Other

## 2022-05-28 ENCOUNTER — Other Ambulatory Visit: Payer: Self-pay

## 2022-05-28 DIAGNOSIS — I25118 Atherosclerotic heart disease of native coronary artery with other forms of angina pectoris: Secondary | ICD-10-CM | POA: Diagnosis not present

## 2022-05-28 DIAGNOSIS — Z0000 Encounter for general adult medical examination without abnormal findings: Secondary | ICD-10-CM | POA: Diagnosis not present

## 2022-05-28 DIAGNOSIS — Z5181 Encounter for therapeutic drug level monitoring: Secondary | ICD-10-CM | POA: Diagnosis not present

## 2022-05-28 DIAGNOSIS — I700 Atherosclerosis of aorta: Secondary | ICD-10-CM

## 2022-05-29 ENCOUNTER — Telehealth: Payer: Self-pay | Admitting: Family Medicine

## 2022-05-29 ENCOUNTER — Encounter: Payer: Self-pay | Admitting: Family Medicine

## 2022-05-29 LAB — CBC WITH DIFFERENTIAL/PLATELET
Basophils Absolute: 0.1 10*3/uL (ref 0.0–0.2)
Basos: 1 %
EOS (ABSOLUTE): 0.1 10*3/uL (ref 0.0–0.4)
Eos: 1 %
Hematocrit: 39.5 % (ref 34.0–46.6)
Hemoglobin: 12.7 g/dL (ref 11.1–15.9)
Immature Grans (Abs): 0 10*3/uL (ref 0.0–0.1)
Immature Granulocytes: 0 %
Lymphocytes Absolute: 1.5 10*3/uL (ref 0.7–3.1)
Lymphs: 17 %
MCH: 28.3 pg (ref 26.6–33.0)
MCHC: 32.2 g/dL (ref 31.5–35.7)
MCV: 88 fL (ref 79–97)
Monocytes Absolute: 1 10*3/uL — ABNORMAL HIGH (ref 0.1–0.9)
Monocytes: 11 %
Neutrophils Absolute: 6.4 10*3/uL (ref 1.4–7.0)
Neutrophils: 70 %
Platelets: 330 10*3/uL (ref 150–450)
RBC: 4.48 x10E6/uL (ref 3.77–5.28)
RDW: 12.2 % (ref 11.7–15.4)
WBC: 9.2 10*3/uL (ref 3.4–10.8)

## 2022-05-29 LAB — COMPREHENSIVE METABOLIC PANEL
ALT: 6 IU/L (ref 0–32)
AST: 18 IU/L (ref 0–40)
Albumin/Globulin Ratio: 1.6 (ref 1.2–2.2)
Albumin: 4.3 g/dL (ref 3.7–4.7)
Alkaline Phosphatase: 125 IU/L — ABNORMAL HIGH (ref 44–121)
BUN/Creatinine Ratio: 30 — ABNORMAL HIGH (ref 12–28)
BUN: 17 mg/dL (ref 8–27)
Bilirubin Total: 0.7 mg/dL (ref 0.0–1.2)
CO2: 26 mmol/L (ref 20–29)
Calcium: 10 mg/dL (ref 8.7–10.3)
Chloride: 102 mmol/L (ref 96–106)
Creatinine, Ser: 0.57 mg/dL (ref 0.57–1.00)
Globulin, Total: 2.7 g/dL (ref 1.5–4.5)
Glucose: 94 mg/dL (ref 70–99)
Potassium: 5.6 mmol/L — ABNORMAL HIGH (ref 3.5–5.2)
Sodium: 142 mmol/L (ref 134–144)
Total Protein: 7 g/dL (ref 6.0–8.5)
eGFR: 92 mL/min/{1.73_m2} (ref >59)

## 2022-05-29 LAB — LIPID PANEL
Chol/HDL Ratio: 1.8 ratio (ref 0.0–4.4)
Cholesterol, Total: 147 mg/dL (ref 100–199)
HDL: 80 mg/dL (ref >39)
LDL Chol Calc (NIH): 57 mg/dL (ref 0–99)
Triglycerides: 40 mg/dL (ref 0–149)
VLDL Cholesterol Cal: 10 mg/dL (ref 5–40)

## 2022-05-29 NOTE — Telephone Encounter (Signed)
Returning your call, please call

## 2022-05-29 NOTE — Telephone Encounter (Signed)
Spoke to pt and she will call next week to come in for labs and said thank you so much for the condolence. Lab orders placed

## 2022-05-31 ENCOUNTER — Encounter: Payer: Self-pay | Admitting: Family Medicine

## 2022-06-05 ENCOUNTER — Telehealth: Payer: Self-pay | Admitting: Family Medicine

## 2022-06-05 ENCOUNTER — Other Ambulatory Visit: Payer: Self-pay | Admitting: *Deleted

## 2022-06-05 DIAGNOSIS — E875 Hyperkalemia: Secondary | ICD-10-CM

## 2022-06-05 NOTE — Telephone Encounter (Signed)
Molly Ross called back, says you can call back anytime, she is now near her phone.

## 2022-06-05 NOTE — Telephone Encounter (Signed)
Called patient back and scheduled her for 06/10/22 @ 10:00.

## 2022-06-10 ENCOUNTER — Other Ambulatory Visit: Payer: Medicare Other

## 2022-06-10 DIAGNOSIS — E875 Hyperkalemia: Secondary | ICD-10-CM | POA: Diagnosis not present

## 2022-06-11 LAB — BASIC METABOLIC PANEL
BUN/Creatinine Ratio: 29 — ABNORMAL HIGH (ref 12–28)
BUN: 17 mg/dL (ref 8–27)
CO2: 24 mmol/L (ref 20–29)
Calcium: 9.9 mg/dL (ref 8.7–10.3)
Chloride: 105 mmol/L (ref 96–106)
Creatinine, Ser: 0.58 mg/dL (ref 0.57–1.00)
Glucose: 81 mg/dL (ref 70–99)
Potassium: 5.1 mmol/L (ref 3.5–5.2)
Sodium: 143 mmol/L (ref 134–144)
eGFR: 91 mL/min/{1.73_m2} (ref >59)

## 2022-07-05 ENCOUNTER — Ambulatory Visit: Payer: Medicare Other | Admitting: Internal Medicine

## 2022-07-05 ENCOUNTER — Encounter: Payer: Self-pay | Admitting: Internal Medicine

## 2022-07-05 ENCOUNTER — Ambulatory Visit (INDEPENDENT_AMBULATORY_CARE_PROVIDER_SITE_OTHER): Payer: Medicare Other | Admitting: Internal Medicine

## 2022-07-05 VITALS — BP 100/50 | HR 71 | Ht 69.0 in | Wt 142.0 lb

## 2022-07-05 DIAGNOSIS — K219 Gastro-esophageal reflux disease without esophagitis: Secondary | ICD-10-CM

## 2022-07-05 DIAGNOSIS — Z7185 Encounter for immunization safety counseling: Secondary | ICD-10-CM

## 2022-07-05 DIAGNOSIS — Z8616 Personal history of COVID-19: Secondary | ICD-10-CM

## 2022-07-05 DIAGNOSIS — J479 Bronchiectasis, uncomplicated: Secondary | ICD-10-CM | POA: Diagnosis not present

## 2022-07-05 DIAGNOSIS — J449 Chronic obstructive pulmonary disease, unspecified: Secondary | ICD-10-CM

## 2022-07-05 DIAGNOSIS — K449 Diaphragmatic hernia without obstruction or gangrene: Secondary | ICD-10-CM

## 2022-07-05 MED ORDER — ANORO ELLIPTA 62.5-25 MCG/ACT IN AEPB: 1.0000 | INHALATION_SPRAY | Freq: Every day | RESPIRATORY_TRACT | 0 refills | Status: DC

## 2022-07-05 NOTE — Patient Instructions (Signed)
Spirometry pre/post and dlco performed today. 

## 2022-07-05 NOTE — Patient Instructions (Addendum)
Bronchiectasis without complication (HCC)  -Stable overall without any flareup -Doing well on Stiolto -Noted that you subjectively do not feel the need to use flutter valve - s/p covid in aug 2022  Plan -Continue stiolto 2 puff once daily  -No samples available so given new sample of Anoro for a month -Hold off on 3% saline nebulizer, roflumilast and vibratory vest because it seems you are not needing it and the symptoms are better -Continue flutter valve 2-4 times daily as needed -Over-the-counter Mucinex DM twice daily as needed   [facility] counseling Flu vaccine need  - respect deferral on covid vaccine - Aug 2022 natural covid gives you natural immunity for 3-12 months  -Consider high-dose flu shot in the fall 2023 - Consider new RSV vaccine    Hiatal hernia with GERD  -The hiatal hernia is very tiny but you have significant acid reflux which might also be contributing to worsening respiratory symptoms  -Glad this is better after starting Prilosec 2 year ago  plan -Continue over-the-counter Prilosec 20 mg daily on empty stomach -Follow this issue with primary care physician  8 mm left lower lobe lung nodule -Stable 2017 through 2020 -No further follow-up  Follow-up 6 months with Dr Chase Caller in our practice for bronchiectasis

## 2022-07-05 NOTE — Progress Notes (Signed)
IOV 06/09/2015  Chief Complaint  Patient presents with   Pulmonary Consult    Pt referred by Dr. Tomi Bamberger for abnormal CT.    81 year old female somewhat of a poor historian. History is gained from repeatedly asking the same questions in many different ways and also review of Dr Rita Ohara notes in the EMR   She reports that she is a 40 pack smoker having quit 34 years ago. In the 1970s and 1980s she had recurrent bouts of pneumonia which left her with bronchiectasis. At baseline she's had some chronic cough of unclear severity and shortness of breath with exertion of unclear severity but probably mild. Unclear if she has associated sputum. Unclear what aggravating and relieving factors where or are. Then in for a 2016 she developed over several weeks worsening cough with green sputum and some fever. Apparently was diagnosed with pneumonia. This then resulted in a CT scan of the chest and her 2016 which I personally reviewed and shows bilateral bronchiectasis particularly on the right middle lobe. She also had a nodule. Antibody treatment she improved. Then in April 2016 was started on Brio which inhaler therapy help her cough and shortness of breath significantly. Currently these are not much much of an issue for her. She has a good quality of life with the Brio. The Brio is expensive. She had follow-up CT scan of the chest 04/26/2015 that shows resolution of the nodule but persistence of the bronchiectasis. In fact she seems to have chronic bronchiectasis dating back on several chest x-rays although it back to 2009. She had test that she's had bronchiectasis since the 1980s. Currently no sputum production not much of a cough or shortness of breath. She denies having had bronchoscopy for the same.  She is not interested in much testing  Noted spirometry pre-breo March 2016  - fev 1.14L/45%, R 56 - severe obstructive lung disease And post Breo in June 05 2015  - Fev 1.32L/52%, R 57 -> significantly  improved but still in moderate category   OV 11/06/2015  Chief Complaint  Patient presents with   Follow-up    Pt here after PFT. Pt states she respiratory infection in October and was on 2 rounds of abx and is now back to her baseline. Pt c/o mild non prod cough. Pt denies CP/tightness.    Previous heavy smoker with right middle lobe chronic bronchiectasis with findings suggestive of MAI and moderate/severe obstructive lung disease with findings of emphysema on CT chest - February and May 2016  - Last seen in June 2016. At that time he started Brio. Overall she has been doing well. However in October 2016 she had a respiratory exacerbation. She was treated with 2 rounds of anabiotic's and prednisone course and then she fell baseline. She is followed up with a pulmonary function test  t 10/30/2015) baseline and well. 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 Currently she is feeling well. Review of vaccine shows she is up-to-date with flu shot. She thinks she has had the pneumonia vaccine with her primary care physician but she's not sure and she is going to check with her.  No new issues.  OV 02/12/2016  Chief Complaint  Patient presents with   Follow-up    Pt reports having some increased breathing issues since last OV- treated a few times with abx/pred for infections. Most recent x 1  week ago- Zpak w/Pred taper. Pt notes some green mucus but cough is much improved.   Heavy smoker with right middle lobe chronic bronchiectasis and some lingular bronchiectasis and scattered infiltrates in the lower lobe based on CT February and May 2016. May 2016 with improvement   - This is a routine follow-up. Last seen November 2016. She is compliant with her inhalers. In the interim approximately on Valentine's Day 2017 had another flareup and he called in Z-Pak and prednisone. This has helped. Cough is  significantly worse but it has improved although not at baseline. She slowly getting better. This will be her third exacerbation. Review of the chart shows an visualization of the CT chest which I did myself and showed her shows findings described above. Chart review also shows that do not had a complete bronchiectasis hematological and etiological workup. She is open to having bronchoscopy   OV 04/23/2016  Chief Complaint  Patient presents with   Follow-up    Pt here after CT chest. Pt states her breathing has improved since last OV. Pt states she is still coughing in the morning but now there is less mucus production.      Heavy smoker with right middle lobe chronic bronchiectasis and some lingular bronchiectasis and scattered infiltrates in the lower lobe based on CT February and May 2016. PFT Nov 2016 -> fev 1.36L/51%, R 60. DLCO 16/51%   In  Nov 2016 I added spiriva and now on triple inhaler Rx- says with this symptoms she is significantly better. Still with cough. Some spuitum +. Labs for etiology reviewed done feb 2017 -> IgG, IgA, IgM - normal. Allpha 1AT - normal. Autoimmune - ANA, CCP, dsDNA, RF, ENA, SSA, SSB, scl-70, RNP - negative.    OV 07/01/2016  Chief Complaint  Patient presents with   Follow-up    Follow up bronchoscopy.  pt c/o R sided back pain X3 days, nonprod cough.      Idiopathic bronchiectasis. Here to review results. Underwent bronchoscopy with lavage 06/21/2016 that showed acute neutrophils 91%. Culture was negative. AFB smear is negative fungal smear was negative. AFB culture is pending. Bronchoscopy showed a lot of mucus. She says that when she lies on she has significant cough and mucus. She feels that after bronchoscopy she improved because of the removal of mucus but shortly after this for the last few days she's having right infrascapular pleuritic and off chest tightness that is consistent with previous exacerbation and she feels she needs an antibiotic and  prednisone course. She is compliant with Spiriva and Brio at this point. Overall even at baseline she admits to copious white mucus.   OV 09/16/2016  Chief Complaint  Patient presents with   Follow-up    Pt states that she does have some lung congestion, little cough and wheezing with SOB. Pt states that she is trying to learn when to "rest" and not overdo herself. Needs refills Breo and Spiriva Resp.    Follow-up idiopathic bronchiectasis. Last visit mid July 2017. After that called in 09/04/2016 with symptoms of bronchiectasis exacerbation. Called in Z-Pak and prednisone. She is back to baseline. At this point in time she tells me that she does not have daily mucus other than mild amounts. She does admit that she is noncompliant with physician with chest physical therapy. She recollects that one time she position herself in an inversion table for back therapy and this cleared of the mucus and she felt much better. She admits that she  needs to do a better job with positional therapy. She is up-to-date with flu shot. She continues on Spiriva and Brio triple inhaler therapy. There are no new issues.   OV 04/24/2017  Chief Complaint  Patient presents with   Acute Visit    Pt c/o hoarseness, prod cough with green mucus, chest tightness x 2 days. Pt denies f/c/s and increase in SOB.   Acute visit for this patient with idiopathic bronchiectasis.  December 2017 she saw Dr. Baltazar Apo with an acute exacerbation. After that she went back to baseline. She does not use chest physical therapy but she uses inhaler therapy only. Now for the last 2 days she's having increased cough with chest tightness and green mucous. Baseline is clear yellow mucus. Low-grade fever present in the office. No wheezing or increased shortness of breath. Significant hoarseness of voice present and some postnasal drip present. She feels she is in bronchiectasis exacerbation. There are no other issues. It is noted that in July  2017 bronchoscopy did not reveal any organisms on lavage    03/17/2018 Follow up : Bronchiectasis /COPD  Returns for a follow-up for bronchiectasis/COPD .  Says that she has been doing well over the last year.  She had no increased cough or congestion.  No recent antibiotic use.  She denies any shortness of breath, hemoptysis, weight loss, edema.  She does have an occasional cough that is minimally productive.  She is very active and independent.  She remains on BREO daily .,  Does depend on samples.  Has not taken Spiriva in >1 year. Has not felt that she needed this.  Recently diagnosed left breast cancer. Plans for surgery later this week.    OV 09/02/2018  Subjective:  Patient ID: Molly Ross, female , DOB: September 10, 1941 , age 47 y.o. , MRN: [id number] , ADDRESS: [place] [id number]   09/02/2018 -   Chief Complaint  Patient presents with   Follow-up    Pt states she has been doing good since last visit except she does have complaints of fatigue and is coughing with occ green to yellow mucus. States cough is worse in the mornings.   81 year old female former smoker followed for COPD and Bronchiectasis. 10/30/2015) baseline and well. Post broncho-dilator FEV1 1.36 L/51% and a ratio 60, total lung capacity 5.2 L/90%, DLCO 16/51%. Last CT 2016 and May 2017 and CXR aug 2019 with chronic changes    HPI Molly Ross 81 y.o. - female with bronchiectasis maintained on Brio. I personally have not seen her since May 2018. Since then she has seen my colleagues for flareupsand gotten anabiotic's of prednisone. Most recently in August 2019. Chest x-ray that time showed just chronic changes. Since that time overall she's feeling well. She is stable. She has sputum production every few days. She uses a flutter valve as needed only. Sometimes of sputum is thick. But overall stable no change in color. Does not feel she is in a flareup.She continuesl and the fluas needed. She is  not on Mucinex. She wants to have the flu shot today.he does been a few years since she had pulmonary function test.   She takes care of her sister in a wheelchair. Another sister died from ovarian cancer.   ROS - per HPI   OV 11/19/2018  Subjective:  Patient ID: Molly Ross, female , DOB: 01/17/1941 , age 5 y.o. , MRN: [id number] , ADDRESS: [place] [address]  11/19/2018 -   Chief Complaint  Patient presents with   Follow-up    f/u bronchiectasis, still coughing up green mucus,      HPI Molly Ross 81 y.o. -follow bronchiectasis associated with COPD colonized with Haemophilus influenza as determined by bronchoscopy in June 2018.  Maintained on Breo and flutter valve  Last seen mid September 2019.  Since then she has had 2 exacerbations treated over the phone.  The first 1 before Halloween 2019 and the second 1 before Thanksgiving 2019.  She states that with the second 1 just before Thanksgiving 2019 she really has not recovered.  She says she feels extremely fatigued and miserable.  In fact her cough is significantly worse than baseline.  She is bringing up copious amount of green sputum daily.  She is also having chest tightness and wheezing.  Also short of breath.  There is no hemoptysis or fever or chills or orthopnea proximal nocturnal dyspnea.  Overall she feels miserable. Continues with breo and flutter valve COPD CAT score  She is not on saline nebulizer treatment or vibratory vest or Roflumilast or nebulizer or inhaled anticholinergic.   Patient has has daily productive cough lasting greater than 24 months requiring antibiotic therapy and frequent exacerbations.  Flutter valve has been tried, but has not effectively mobilized secretions.  Considered and ruled out manual CPT as no consistent skilled caregiver available to perform therapy.  CT scan performed on 04/15/2016 confirms bronchiectasis.  Meets indication for vest therapy   OV  12/29/2018  Subjective:  Patient ID: Molly Ross, female , DOB: Jan 04, 1941 , age 31 y.o. , MRN: [id number] , ADDRESS: [place] [id number]   12/29/2018 -   Chief Complaint  Patient presents with   Results   follow bronchiectasis associated with COPD colonized with Haemophilus influenza as determined by bronchoscopy in June 2018 , pseudomonas on sputum Dec 2019   Maintained on Breo and flutter valve and in December 2019 added 3% saline nebulizer and Spiriva   HPI Molly Ross 81 y.o. -presents for follow-up after her November 16, 2018 visit for recurrent COPD/bronchiectasis exacerbation.  Sputum culture grew Pseudomonas and will change antibiotics to ciprofloxacin.  We introduce Spiriva, 3% saline nebulizer and also vibratory vest.  Of these measures she did the ciprofloxacin Spiriva and 3% saline nebulizer.  With this along with the Brio and flutter valve her symptoms are significantly improved.  She hardly brings out any sputum and it is clear.  She does not want to do the vibratory vest because she is worried about chest muscle soreness after her breast cancer treatment and also the $1900 co-pay which apparently is one time.  She is not doing any Mucinex.  She is not interested in cyclical suppressive antibiotic therapy at this stage.  She did have a CT scan of the chest and this the bronchiectasis is evident, and compared to 2017 8 mm left lower lobe nodule is unchanged some of the volume loss is unchanged and stable.  There is a report of coronary artery calcification which is a new finding.  She does not have any chest pain.  Her previous cardiologist is Dr. Tollie Eth and he did a stress test according to him many years ago.  She wants to see him again.  She is also worried about hiatal hernia finding in the CT scan.  The report is very small hiatal hernia.  But she does admit to significant acid reflux for which  she is not taking any treatment.  She is agreed to  take Prilosec and will follow-up with primary care physician.   OV 01/17/2020  Subjective:  Patient ID: Molly Ross, female , DOB: [birth year] , age 19 y.o. , MRN: [id number] , ADDRESS: [place] [id number]   01/17/2020 -   Chief Complaint  Patient presents with   Follow-up    yearly follow up - breathing is at baseline, no new complaints   follow bronchiectasis [idiopathic work-up February 2017 with negative serology and negative immunoglobulin] associated with COPD colonized with Haemophilus influenza as determined by bronchoscopy in June 2018 , pseudomonas on sputum Dec 2019   Maintained on Breo, [place] and flutter valve and in December 2019.  Not taking 3% saline nebulizer  Normal stress Myoview in August 2016  Background history of breast cancer followed by Dr. Lindi Adie   HPI Molly Ross 81 y.o. -returns for follow-up.  Last seen in January 2020.  After that because of the pandemic 2019 with Covid she has not returned for follow-up.  She says she has been followed from her job.  Without the external exposures in the environment she has been social distancing and isolating.  Therefore because of this her respiratory symptoms are better.  Her sputum production has improved a lot.  She certainly had 1 exacerbation.  Sputum volume is less cough is much less.  She is only on [place].  She decided not to do the 3% saline nebulizer because she feels she does not need it.  She barely brings out any sputum.  She has had a first dose COVID-19 vaccine Moderna.  She will have the second dose soon    Last pulmonary function test November 2016 with FEV1 postbronchodilator 1.36/51% and DLCO 16/51  Last CT scan of the chest January 2020   ROS - per HPI  OV 10/26/2021  Subjective:  Patient ID: Molly Ross, female , DOB: 1941-09-21 , age 87 y.o. , MRN: [id number] , ADDRESS: [place] [id number] PCP Rita Ohara, MD Patient  Care Team: Rita Ohara, MD as PCP - General (Family Medicine) Stark Klein, MD as Consulting Physician (General Surgery) Nicholas Lose, MD as Consulting Physician (Hematology and Oncology) Eppie Gibson, MD as Attending Physician (Radiation Oncology) Gardenia Phlegm, NP as Nurse Practitioner (Hematology and Oncology) Jacolyn Reedy, MD as Consulting Physician (Cardiology)  This Provider for this visit: Treatment Team:  Attending Provider: Brand Males, MD    10/26/2021 -   Chief Complaint  Patient presents with   Follow-up    Pt states she has been doing good since last visit. States she has had one flare up since last visit but has been doing okay.   follow bronchiectasis [idiopathic work-up February 2017 with negative serology and negative immunoglobulin] associated with COPD colonized with Haemophilus influenza as determined by bronchoscopy in June 2018 , pseudomonas on sputum Dec 2019   Maintained on Breo, [place] and flutter valve and in December 2019.  Not taking 3% saline nebulizer  Normal stress Myoview in August 2016  Background history of breast cancer followed by Dr. Lindi Adie  HPI Molly Ross 81 y.o. -follow-up bronchiectasis.  Personally not since early 2021.  She was supposed to establish care with Dr. Carlis Abbott on 04 colleagues who specialize in bronchiectasis.  But Dr. Carlis Abbott is now doing only critical care medicine.  Therefore she is back to see me.  In August 2022 she  had COVID-19.  After that she had 1 flareup of bronchiectasis.  Overall however since the pandemic started she has been masking and social distancing and taking Spiriva and Breo.  This is provided excellent symptom control.  She does not have much symptoms.  Not much sputum.  She continues flutter valve.  She is not on oxygen.  She is not on vibratory vest.  She is not on Roflumilast.  She is not on 3% saline nebulizer.  Sputum production is not much.   She agreed to have the flu shot today  but she declined COVID-vaccine.  She had natural COVID in August 2022.    03/14/2022 Follow up : Bronchiectasis  and COPD  Returns for 4 month follow up . Says was doing better until 4 weeks ago , she fell at home with right sided rib fractures. Went to ER , Chest xray showed minimally displaced right 9/10 rib fractures. Says very sore , sleeping in recliner. No longer taking pain meds. Cough was increased until this week. Worried that she still has pain.Bruising has resolved but still very sore in right ribs and back.   Remains on Stiolto  daily . Uses flutter valve but not able to last few weeks. Not using nebs currently . Has started incentive spirometry . Had Covid 19 infection in January 2023 . Improved with Paxlovid. Cough is at baseline .No fever, hemoptysis , weight loss.  Appetite is fair.        OV 07/05/2022  Subjective:  Patient ID: Molly Ross, female , DOB: [birth year] , age 71 y.o. , MRN: [id number] , ADDRESS: [place] [id number] PCP Rita Ohara, MD Patient Care Team: Rita Ohara, MD as PCP - General (Family Medicine) Stark Klein, MD as Consulting Physician (General Surgery) Nicholas Lose, MD as Consulting Physician (Hematology and Oncology) Eppie Gibson, MD as Attending Physician (Radiation Oncology) Delice Bison, Charlestine Massed, NP as Nurse Practitioner (Hematology and Oncology) Brand Males, MD as Consulting Physician (Pulmonary Disease) Richardo Priest, MD as Consulting Physician (Cardiology) Otis Brace, MD as Consulting Physician (Gastroenterology) Delsa Sale, [place] (Optometry) Alanda Slim, Neena Rhymes, MD as Consulting Physician (Ophthalmology) Ulis Rias, DMD (Dentistry)  This Provider for this visit: Treatment Team:  Attending Provider: Brand Males, MD    07/05/2022 -   Chief Complaint  Patient presents with   Follow-up    Follow-up    follow bronchiectasis [idiopathic work-up 2016-02-03 with  negative serology and negative immunoglobulin] associated with COPD colonized with Haemophilus influenza as determined by bronchoscopy in June 2018 , pseudomonas on sputum Dec 2019   Maintained on [place], [place] and flutter valve and in December 2019.  Not taking 3% saline nebulizer  Normal stress Myoview in August 2016  Background history of breast cancer followed by Dr. Lindi Adie  HPI Molly Ross 81 y.o. -returns for follow-up.  She is doing well.  In between she is seen the nurse practitioner.  In the interim her sister passed away.  I used to know her sister.  Her sister went to SNF 3 years ago and then recently had aspiration and passed away.  She says in 02-02-22 she fell down and fractured her ribs and after that was in the recliner.  Being in the recliner helped her because she avoided going outside.  Currently working at [employer] and able to do it.  She does get tired a little bit.  She is on Stiolto and she likes it.  She  is asking for samples but we only had [place] samples.  She is not that compliant with the flutter valve because she feels she does not need it.  Overall stable she had pulmonary function test which I believe shows stability/slight age-related decline I went over the results with her and visualized the image myself    CT Chest data  No results found.    PFT     Latest Ref Rng & Units 07/05/2022    9:42 AM 10/30/2015    4:38 PM  PFT Results  FVC-Pre L 2.03  P 2.13   FVC-Predicted Pre % 62  P 61   FVC-Post L 2.01  P 2.26   FVC-Predicted Post % 61  P 64   Pre FEV1/FVC % % 58  P 60   Post FEV1/FCV % % 58  P 60   FEV1-Pre L 1.17  P 1.27   FEV1-Predicted Pre % 47  P 48   FEV1-Post L 1.17  P 1.36   DLCO uncorrected ml/min/mmHg 12.66  P 16.04   DLCO UNC% % 57  P 51   DLCO corrected ml/min/mmHg 12.95  P   DLCO COR %Predicted % 58  P   DLVA Predicted % 110  P 83   TLC L  5.24   TLC % Predicted %  90   RV % Predicted %  118     P Preliminary result        has a past medical history of Atherosclerosis of aorta ([place]) (02/02/2015), Bronchiectasis ([place]), Bronchiectasis with (acute) exacerbation ([place]) (06/09/2015), CAD (coronary artery disease), native coronary artery, Cancer ([place]), Cellulitis of left breast (10/01/2018), Colon polyp, COPD, moderate ([place]) (11/06/2015), Coronary artery calcification seen on CT scan, Depressive disorder, not elsewhere classified (12/04/2011), Dyspnea, Emphysema of lung ([place]) (02/02/2015), Family history of lung cancer, Family history of ovarian cancer, Family history of prostate cancer, Genetic testing (03/27/2018), GERD (gastroesophageal reflux disease), Hiatal hernia (02/02/2015), History of radiation therapy (04/16/18- 05/14/18), History of smoking 25-50 pack years (06/09/2015), Malignant neoplasm of lower-inner quadrant of left breast in female, estrogen receptor positive ([place]) (03/06/2018), Osteopenia (02/18/2018), Personal history of radiation therapy (2019), Pleuritic pain (10/01/2018), Shingles (1/09, 07/2010, 11/2017), Unspecified vitamin D deficiency (02/2011 and 2016), and Vitamin D deficiency (02/14/2011).   reports that she quit smoking about 42 years ago. Her smoking use included cigarettes. She has a 48.00 pack-year smoking history. She has never used smokeless tobacco.  Past Surgical History:  Procedure Laterality Date   BREAST EXCISIONAL BIOPSY Right    benign years ago   BREAST LUMPECTOMY Left 2019   BREAST LUMPECTOMY WITH RADIOACTIVE SEED AND SENTINEL LYMPH NODE BIOPSY Left 03/19/2018   Procedure: BREAST LUMPECTOMY WITH RADIOACTIVE SEED AND SENTINEL LYMPH NODE BIOPSY;  Surgeon: Stark Klein, MD;  Location: [facility];  Service: General;  Laterality: Left;   CATARACT EXTRACTION, BILATERAL Bilateral 03/2017, 04/2017   [facility], Dr. Alanda Slim   SHOULDER SURGERY  03/2011   for frozen shoulder, and cyst removal (Dr. Percell Miller)   TONSILLECTOMY  child   VIDEO BRONCHOSCOPY Bilateral 06/21/2016   Procedure:  VIDEO BRONCHOSCOPY WITHOUT FLUORO;  Surgeon: Brand Males, MD;  Location: [facility];  Service: Cardiopulmonary;  Laterality: Bilateral;    No Known Allergies  Immunization History  Administered Date(s) Administered   Fluad Quad(high Dose 65+) 08/25/2019, 09/07/2020, 10/26/2021   Influenza, High Dose Seasonal PF 01/26/2014, 10/19/2014, 08/30/2015, 08/08/2016, 08/05/2017, 09/02/2018   Moderna Sars-Covid-2 Vaccination 12/29/2019, 01/26/2020   Pneumococcal Conjugate-13 12/25/2015  Pneumococcal Polysaccharide-23 12/30/2016   Tdap 09/03/2017   Zoster Recombinat (Shingrix) 03/25/2021    Family History  Problem Relation Age of Onset   Stroke Mother    Diabetes Mother    Lung cancer Father 40   Stroke Sister 38       brainstem stroke   Diabetes Sister    Seizures Sister    Ovarian cancer Sister 54       metastatic to lung, liver   Cancer Sister    Heart disease Maternal Grandmother    Heart disease Paternal Grandmother    Ovarian cancer Maternal Aunt 95   Prostate cancer Cousin    Lung cancer Cousin        recurrent, now stage 4   Breast cancer Neg Hx      Current Outpatient Medications:    alendronate (FOSAMAX) 70 MG tablet, Take 1 tablet (70 mg total) by mouth every 7 (seven) days. Take with a full glass of water on an empty stomach., Disp: 12 tablet, Rfl: 3   Ascorbic Acid (VITAMIN C) 100 MG tablet, Take 1 tablet (100 mg total) by mouth daily., Disp: , Rfl:    aspirin 81 MG EC tablet, Take 1 tablet (81 mg total) by mouth daily. Swallow whole., Disp: 1 tablet, Rfl: 0   atorvastatin (LIPITOR) 20 MG tablet, TAKE 1 TABLET BY MOUTH EVERY DAY, Disp: 90 tablet, Rfl: 2   Calcium-Magnesium-Vitamin D (CALCIUM 500 PO), Take 1 tablet by mouth daily., Disp: , Rfl:    cholecalciferol (VITAMIN D3) 25 MCG (1000 UNIT) tablet, Take 1,000 Units by mouth daily., Disp: , Rfl:    letrozole (FEMARA) 2.5 MG tablet, TAKE 1 TABLET BY MOUTH EVERY DAY, Disp: 90 tablet, Rfl: 0   Multiple  Vitamins-Minerals (MULTIVITAMIN WITH MINERALS) tablet, Take 1 tablet by mouth daily., Disp: , Rfl:    omeprazole (PRILOSEC OTC) 20 MG tablet, Take 20 mg by mouth daily., Disp: , Rfl:    Tiotropium Bromide-Olodaterol (STIOLTO RESPIMAT) 2.5-2.5 MCG/ACT AERS, Inhale 2 puffs into the lungs daily., Disp: 4 g, Rfl: 5   umeclidinium-vilanterol (ANORO ELLIPTA) 62.5-25 MCG/ACT AEPB, Inhale 1 puff into the lungs daily., Disp: 7 each, Rfl: 0   nitroGLYCERIN (NITROSTAT) 0.4 MG SL tablet, Place 1 tablet (0.4 mg total) under the tongue every 5 (five) minutes as needed. (Patient not taking: Reported on 12/05/2021), Disp: 25 tablet, Rfl: 6      Objective:   Vitals:   07/05/22 1057  BP: (!) 100/50  Pulse: 71  SpO2: 97%  Weight: 142 lb (64.4 kg)  Height: '5\' 9"'$  (1.753 m)    Estimated body mass index is 20.97 kg/m as calculated from the following:   Height as of this encounter: '5\' 9"'$  (1.753 m).   Weight as of this encounter: 142 lb (64.4 kg).  '@WEIGHTCHANGE'$ @  [employer]   07/05/22 1057  Weight: 142 lb (64.4 kg)     Physical Exam    General: No distress. Looks well Neuro: Alert and Oriented x 3. GCS 15. Speech normal Psych: Pleasant Resp:  Barrel Chest - no.  Wheeze - no, Crackles - no, No overt respiratory distress CVS: Normal heart sounds. Murmurs - no Ext: Stigmata of Connective Tissue Disease - no HEENT: Normal upper airway. PEERL +. No post nasal drip        Assessment:       ICD-10-CM   1. Bronchiectasis without complication ([place])  G81.8     2. History of 2019 novel coronavirus disease (COVID-19)  N98.16     3. Hiatal hernia with GERD  K44.9    K21.9     4. Vaccine counseling  Z71.85          Plan:     Patient Instructions  Bronchiectasis without complication ([place])  -Stable overall without any flareup -Doing well on Stiolto -Noted that you subjectively do not feel the need to use flutter valve - s/p covid in aug 2022  Plan -Continue stiolto 2 puff once  daily  -No samples available so given new sample of Anoro for a month -Hold off on 3% saline nebulizer, roflumilast and vibratory vest because it seems you are not needing it and the symptoms are better -Continue flutter valve 2-4 times daily as needed -Over-the-counter Mucinex DM twice daily as needed   [facility] counseling Flu vaccine need  - respect deferral on covid vaccine - Aug 2022 natural covid gives you natural immunity for 3-12 months  -Consider high-dose flu shot in the fall 2023 - Consider new RSV vaccine    Hiatal hernia with GERD  -The hiatal hernia is very tiny but you have significant acid reflux which might also be contributing to worsening respiratory symptoms  -Glad this is better after starting Prilosec 2 year ago  plan -Continue over-the-counter Prilosec 20 mg daily on empty stomach -Follow this issue with primary care physician  8 mm left lower lobe lung nodule -Stable 2017 through 2020 -No further follow-up  Follow-up 6 months with Dr Chase Caller in our practice for bronchiectasis        SIGNATURE    Dr. Brand Males, M.D., F.C.C.P,  Pulmonary and Critical Care Medicine Staff Physician, [place] Director - Interstitial Lung Disease  Program  [facility] [place], [place], [address]  Pager: [phone], If no answer or between  15:00h - 7:00h: call [phone] Telephone: [phone]  12:29 PM 07/05/2022

## 2022-07-05 NOTE — Progress Notes (Signed)
Spirometry pre/post and dlco performed today. 

## 2022-07-09 ENCOUNTER — Ambulatory Visit: Payer: Medicare Other | Admitting: Hematology and Oncology

## 2022-07-11 LAB — PULMONARY FUNCTION TEST
DL/VA % pred: 110 %
DL/VA: 4.33 ml/min/mmHg/L
DLCO cor % pred: 58 %
DLCO cor: 12.95 ml/min/mmHg
DLCO unc % pred: 57 %
DLCO unc: 12.66 ml/min/mmHg
FEF 25-75 Post: 0.46 L/sec
FEF 25-75 Pre: 0.44 L/sec
FEF2575-%Change-Post: 3 %
FEF2575-%Pred-Post: 26 %
FEF2575-%Pred-Pre: 25 %
FEV1-%Change-Post: 0 %
FEV1-%Pred-Post: 48 %
FEV1-%Pred-Pre: 47 %
FEV1-Post: 1.17 L
FEV1-Pre: 1.17 L
FEV1FVC-%Change-Post: 1 %
FEV1FVC-%Pred-Pre: 77 %
FEV6-%Change-Post: 0 %
FEV6-%Pred-Post: 63 %
FEV6-%Pred-Pre: 64 %
FEV6-Post: 1.96 L
FEV6-Pre: 1.98 L
FEV6FVC-%Change-Post: 0 %
FEV6FVC-%Pred-Post: 102 %
FEV6FVC-%Pred-Pre: 102 %
FVC-%Change-Post: -1 %
FVC-%Pred-Post: 61 %
FVC-%Pred-Pre: 62 %
FVC-Post: 2.01 L
FVC-Pre: 2.03 L
Post FEV1/FVC ratio: 58 %
Post FEV6/FVC ratio: 98 %
Pre FEV1/FVC ratio: 58 %
Pre FEV6/FVC Ratio: 97 %

## 2022-07-15 ENCOUNTER — Other Ambulatory Visit: Payer: Self-pay | Admitting: Family Medicine

## 2022-07-15 DIAGNOSIS — Z1231 Encounter for screening mammogram for malignant neoplasm of breast: Secondary | ICD-10-CM

## 2022-07-17 ENCOUNTER — Ambulatory Visit (INDEPENDENT_AMBULATORY_CARE_PROVIDER_SITE_OTHER): Payer: Medicare Other

## 2022-07-17 DIAGNOSIS — Z1231 Encounter for screening mammogram for malignant neoplasm of breast: Secondary | ICD-10-CM | POA: Diagnosis not present

## 2022-07-19 ENCOUNTER — Other Ambulatory Visit: Payer: Self-pay | Admitting: Family Medicine

## 2022-07-19 DIAGNOSIS — R928 Other abnormal and inconclusive findings on diagnostic imaging of breast: Secondary | ICD-10-CM

## 2022-07-24 ENCOUNTER — Ambulatory Visit (INDEPENDENT_AMBULATORY_CARE_PROVIDER_SITE_OTHER): Payer: Medicare Other

## 2022-07-24 DIAGNOSIS — M8588 Other specified disorders of bone density and structure, other site: Secondary | ICD-10-CM | POA: Diagnosis not present

## 2022-07-24 DIAGNOSIS — M810 Age-related osteoporosis without current pathological fracture: Secondary | ICD-10-CM | POA: Diagnosis not present

## 2022-07-24 DIAGNOSIS — M85851 Other specified disorders of bone density and structure, right thigh: Secondary | ICD-10-CM | POA: Diagnosis not present

## 2022-07-25 ENCOUNTER — Ambulatory Visit
Admission: RE | Admit: 2022-07-25 | Discharge: 2022-07-25 | Disposition: A | Discharge status: Home / Self Care | Payer: Medicare Other | Source: Ambulatory Visit | Attending: Family Medicine | Admitting: Family Medicine

## 2022-07-25 DIAGNOSIS — R928 Other abnormal and inconclusive findings on diagnostic imaging of breast: Secondary | ICD-10-CM

## 2022-07-25 DIAGNOSIS — R922 Inconclusive mammogram: Secondary | ICD-10-CM | POA: Diagnosis not present

## 2022-08-10 ENCOUNTER — Other Ambulatory Visit: Payer: Self-pay | Admitting: Hematology and Oncology

## 2022-08-13 ENCOUNTER — Telehealth: Payer: Self-pay | Admitting: Hematology and Oncology

## 2022-08-13 NOTE — Telephone Encounter (Signed)
Scheduled appointment per 8/28 staff message. Left voicemail for patient.

## 2022-08-21 ENCOUNTER — Encounter: Payer: Self-pay | Admitting: Internal Medicine

## 2022-08-26 ENCOUNTER — Ambulatory Visit (INDEPENDENT_AMBULATORY_CARE_PROVIDER_SITE_OTHER): Payer: Medicare Other | Admitting: Family Medicine

## 2022-08-26 ENCOUNTER — Other Ambulatory Visit: Payer: Self-pay | Admitting: Hematology and Oncology

## 2022-08-26 ENCOUNTER — Encounter: Payer: Self-pay | Admitting: Family Medicine

## 2022-08-26 VITALS — BP 126/62 | HR 76 | Temp 98.5°F | Ht 69.0 in | Wt 141.8 lb

## 2022-08-26 DIAGNOSIS — Z23 Encounter for immunization: Secondary | ICD-10-CM | POA: Diagnosis not present

## 2022-08-26 DIAGNOSIS — I8393 Asymptomatic varicose veins of bilateral lower extremities: Secondary | ICD-10-CM | POA: Diagnosis not present

## 2022-08-26 NOTE — Progress Notes (Signed)
Chief Complaint  Patient presents with   Mass    Right leg "bulge" inner calf area. She feels like it is warm, no pain. Feet are swelling, she has been on her feet a lot.     A week ago she noted a bulge at the right lower leg, medially. It hasn't been sore. It fluctuates in size. It is larger when standing, smaller in the mornings.  She has been working 4 days/week x 2 months, on her feet more. Has noticed some swelling  at the ankles.  Doesn't use compression socks.  PMH, PSH, SH reviewed  Outpatient Encounter Medications as of 08/26/2022  Medication Sig   alendronate (FOSAMAX) 70 MG tablet Take 1 tablet (70 mg total) by mouth every 7 (seven) days. Take with a full glass of water on an empty stomach.   Ascorbic Acid (VITAMIN C) 100 MG tablet Take 1 tablet (100 mg total) by mouth daily.   aspirin 81 MG EC tablet Take 1 tablet (81 mg total) by mouth daily. Swallow whole.   atorvastatin (LIPITOR) 20 MG tablet TAKE 1 TABLET BY MOUTH EVERY DAY   Calcium-Magnesium-Vitamin D (CALCIUM 500 PO) Take 1 tablet by mouth daily.   cholecalciferol (VITAMIN D3) 25 MCG (1000 UNIT) tablet Take 1,000 Units by mouth daily.   letrozole (FEMARA) 2.5 MG tablet TAKE 1 TABLET BY MOUTH EVERY DAY   Multiple Vitamins-Minerals (MULTIVITAMIN WITH MINERALS) tablet Take 1 tablet by mouth daily.   omeprazole (PRILOSEC OTC) 20 MG tablet Take 20 mg by mouth daily.   Tiotropium Bromide-Olodaterol (STIOLTO RESPIMAT) 2.5-2.5 MCG/ACT AERS Inhale 2 puffs into the lungs daily.   nitroGLYCERIN (NITROSTAT) 0.4 MG SL tablet Place 1 tablet (0.4 mg total) under the tongue every 5 (five) minutes as needed. (Patient not taking: Reported on 12/05/2021)   umeclidinium-vilanterol (ANORO ELLIPTA) 62.5-25 MCG/ACT AEPB Inhale 1 puff into the lungs daily. (Patient not taking: Reported on 08/26/2022)   No facility-administered encounter medications on file as of 08/26/2022.   No Known Allergies  ROS:  Breathing is at baseline. No fever,  chills, URI symptoms. Some swelling of ankles per HPI. No chest pain, shortness of breath. See HPI   PHYSICAL EXAM:  BP 126/62   Pulse 76   Temp 98.5 F (36.9 C) (Tympanic)   Ht '5\' 9"'$  (1.753 m)   Wt 141 lb 12.8 oz (64.3 kg)   BMI 20.94 kg/m   Well-appearing, pleasant female, in no distress HEENT: conjunctiva and sclera are clear, EOMI She is alert and oriented, speaking comfortably. Rare cough. She is alert and oriented.  Normal gait. Extremities: 2+ pulses Trace pitting edema at the ankles. Some varicosities present bilaterally. There is a broader diffuse bulge at the right medial lower leg. This is more prominent with standing. There is slight discomfort inferomedial to the original bulge she noted. No erythema, warm. No firm areas.   ASSESSMENT/PLAN:  Varicose veins of both lower extremities, unspecified whether complicated - "bulge" seems vascular, overlying VV, nontender, no e/o thrombosis. Compression socks and elevation recommended  Need for influenza vaccination - Plan: Flu Vaccine QUAD High Dose(Fluad), CANCELED: Flu vaccine HIGH DOSE PF (Fluzone High dose)

## 2022-08-26 NOTE — Patient Instructions (Signed)
I recommend wearing compression sock (knee high) of 20-30 mm hg of pressure. Wear these to work and whenever you are sitting or standing prolonged periods of time where you can't have your legs elevated. Put these on first thing in the morning, prior to developing and swelling or discomfort.  If you notice redness, focal swelling, pain, or a tender know, please return for re-evaluation.  This could a blood clot.  Right now I think the swelling (which is around the area of a vein collection) is related to your varicose veins.

## 2022-09-16 ENCOUNTER — Telehealth: Payer: Self-pay | Admitting: Internal Medicine

## 2022-09-16 NOTE — Telephone Encounter (Signed)
Called and spoke with patient. She stated that she will be leaving for [place] tomorrow and wanted to know if she needed to take any special precautions. She confirmed that she is not currently having an symptoms of a bronchiectasis flare up.   MR, can you please advise? Thanks!

## 2022-09-16 NOTE — Telephone Encounter (Signed)
If [place] is not [place] she has to keep herself hydrated.  Also watch out for any high-altitude pulmonary sickness and then if that happens she needs to descend down immediately or go to an emergency department  Likewise  bronchiectasis exacerbations can happen.  In that case she can call us and we can try to call medications into nearest pharmacy  Given travel she is going be susceptible to respiratory viral infection such as COVID or flu.  She needs to watch out for this.  Advised masking

## 2022-09-23 ENCOUNTER — Ambulatory Visit: Payer: Medicare Other | Admitting: Hematology and Oncology

## 2022-09-24 ENCOUNTER — Encounter: Payer: Self-pay | Admitting: Internal Medicine

## 2022-10-03 NOTE — Progress Notes (Incomplete)
Patient Care Team: Rita Ohara, MD as PCP - General (Family Medicine) Stark Klein, MD as Consulting Physician (General Surgery) Nicholas Lose, MD as Consulting Physician (Hematology and Oncology) Eppie Gibson, MD as Attending Physician (Radiation Oncology) Delice Bison Charlestine Massed, NP as Nurse Practitioner (Hematology and Oncology) Brand Males, MD as Consulting Physician (Pulmonary Disease) Richardo Priest, MD as Consulting Physician (Cardiology) Otis Brace, MD as Consulting Physician (Gastroenterology) Delsa Sale, OD (Optometry) Alanda Slim, Neena Rhymes, MD as Consulting Physician (Ophthalmology) Ulis Rias, DMD (Dentistry)  DIAGNOSIS: No diagnosis found.  SUMMARY OF ONCOLOGIC HISTORY: Oncology History  Malignant neoplasm of lower-inner quadrant of left breast in female, estrogen receptor positive ([place])  03/06/2018 Initial Diagnosis   Screening detected left breast mass at 8 o'clock position 1.2 cm in size lower inner quadrant axilla is negative biopsy revealed grade 2 invasive ductal carcinoma ER 100%, PR 90%, Ki-67 5%, HER-2 negative, T1 be N0 stage I a clinical stage   03/19/2018 Surgery   Left Lumpectomy: IDC Grade 1, 1.3 cm, 1/3 LN Positive, with LVI, T1cN1 ER 100%, PR 90%, Ki-67 5%, HER-2 negative Stage 1B   03/21/2018 Genetic Testing   The Common Hereditary Cancer Panel offered by [facility] includes sequencing and/or deletion duplication testing of the following 47 genes: APC, ATM, AXIN2, BARD1, BMPR1A, BRCA1, BRCA2, BRIP1, CDH1, CDKN2A (p14ARF), CDKN2A (p16INK4a), CKD4, CHEK2, CTNNA1, DICER1, EPCAM (Deletion/duplication testing only), GREM1 (promoter region deletion/duplication testing only), KIT, MEN1, MLH1, MSH2, MSH3, MSH6, MUTYH, NBN, NF1, NHTL1, PALB2, PDGFRA, PMS2, POLD1, POLE, PTEN, RAD50, RAD51C, RAD51D, SDHB, SDHC, SDHD, SMAD4, SMARCA4. STK11, TP53, TSC1, TSC2, and VHL.  The following genes were evaluated for sequence changes only: SDHA and HOXB13  c.251G>A variant only.  Results: No pathogenic variants identified.   A variant of uncertain significance in the gene SMARCA4 was identified c.5011C>T ([id number]).  The date of this test report is 03/21/2018   04/10/2018 Cancer Staging   Staging form: Breast, AJCC 8th Edition - Pathologic: Stage IA (pT1c, pN1a, cM0, G1, ER+, PR+, HER2-) - Signed by Eppie Gibson, MD on 04/10/2018   04/16/2018 - 05/14/2018 Radiation Therapy   Adjuvant radiation therapy   05/2018 -  Anti-estrogen oral therapy   Letrozole daily     CHIEF COMPLIANT: Follow-up on antiestrogen therapy with letrozole  INTERVAL HISTORY: Molly Ross is a 81 y.o. with above-mentioned history of left breast cancer treated with lumpectomy, radiation, and who is currently on anti-estrogen therapy with letrozole. Mammogram on 04/06/21 showed no evidence of malignancy bilaterally. She presents to the clinic today for follow-up.    ALLERGIES:  has No Known Allergies.  MEDICATIONS:  Current Outpatient Medications  Medication Sig Dispense Refill   alendronate (FOSAMAX) 70 MG tablet Take 1 tablet (70 mg total) by mouth every 7 (seven) days. Take with a full glass of water on an empty stomach. 12 tablet 3   Ascorbic Acid (VITAMIN C) 100 MG tablet Take 1 tablet (100 mg total) by mouth daily.     aspirin 81 MG EC tablet Take 1 tablet (81 mg total) by mouth daily. Swallow whole. 1 tablet 0   atorvastatin (LIPITOR) 20 MG tablet TAKE 1 TABLET BY MOUTH EVERY DAY 90 tablet 2   Calcium-Magnesium-Vitamin D (CALCIUM 500 PO) Take 1 tablet by mouth daily.     cholecalciferol (VITAMIN D3) 25 MCG (1000 UNIT) tablet Take 1,000 Units by mouth daily.     letrozole (FEMARA) 2.5 MG tablet TAKE 1 TABLET BY MOUTH EVERY DAY 30 tablet 0  Multiple Vitamins-Minerals (MULTIVITAMIN WITH MINERALS) tablet Take 1 tablet by mouth daily.     nitroGLYCERIN (NITROSTAT) 0.4 MG SL tablet Place 1 tablet (0.4 mg total) under the tongue every 5 (five) minutes as needed.  (Patient not taking: Reported on 12/05/2021) 25 tablet 6   omeprazole (PRILOSEC OTC) 20 MG tablet Take 20 mg by mouth daily.     Tiotropium Bromide-Olodaterol (STIOLTO RESPIMAT) 2.5-2.5 MCG/ACT AERS Inhale 2 puffs into the lungs daily. 4 g 5   umeclidinium-vilanterol (ANORO ELLIPTA) 62.5-25 MCG/ACT AEPB Inhale 1 puff into the lungs daily. (Patient not taking: Reported on 08/26/2022) 7 each 0   No current facility-administered medications for this visit.    PHYSICAL EXAMINATION: ECOG PERFORMANCE STATUS: {CHL ONC ECOG PS:[phone]}  There were no vitals filed for this visit. There were no vitals filed for this visit.  BREAST:*** No palpable masses or nodules in either right or left breasts. No palpable axillary supraclavicular or infraclavicular adenopathy no breast tenderness or nipple discharge. (exam performed in the presence of a chaperone)  LABORATORY DATA:  I have reviewed the data as listed    Latest Ref Rng & Units 06/10/2022    9:56 AM 05/28/2022    8:52 AM 09/07/2020   11:05 AM  CMP  Glucose 70 - 99 mg/dL 81  94  79   BUN 8 - 27 mg/dL '17  17  12   ' Creatinine 0.57 - 1.00 mg/dL 0.58  0.57  0.54   Sodium 134 - 144 mmol/L 143  142  142   Potassium 3.5 - 5.2 mmol/L 5.1  5.6  4.5   Chloride 96 - 106 mmol/L 105  102  101   CO2 20 - 29 mmol/L '24  26  26   ' Calcium 8.7 - 10.3 mg/dL 9.9  10.0  10.1   Total Protein 6.0 - 8.5 g/dL  7.0  7.5   Total Bilirubin 0.0 - 1.2 mg/dL  0.7  1.2   Alkaline Phos 44 - 121 IU/L  125  112   AST 0 - 40 IU/L  18  18   ALT 0 - 32 IU/L  6  10     Lab Results  Component Value Date   WBC 9.2 05/28/2022   HGB 12.7 05/28/2022   HCT 39.5 05/28/2022   MCV 88 05/28/2022   PLT 330 05/28/2022   NEUTROABS 6.4 05/28/2022    ASSESSMENT & PLAN:  No problem-specific Assessment & Plan notes found for this encounter.    No orders of the defined types were placed in this encounter.  The patient has a good understanding of the overall plan. she agrees with  it. she will call with any problems that may develop before the next visit here. Total time spent: 30 mins including face to face time and time spent for planning, charting and co-ordination of care   Suzzette Righter, [place] 10/03/22    I Gardiner Coins am scribing for Dr. Lindi Adie  ***

## 2022-10-04 ENCOUNTER — Telehealth: Payer: Self-pay | Admitting: Internal Medicine

## 2022-10-04 MED ORDER — PREDNISONE 10 MG PO TABS: ORAL_TABLET | ORAL | 0 refills | Status: AC

## 2022-10-04 MED ORDER — CEPHALEXIN 500 MG PO CAPS: 500.0000 mg | ORAL_CAPSULE | Freq: Three times a day (TID) | ORAL | 0 refills | Status: DC

## 2022-10-04 NOTE — Telephone Encounter (Signed)
I called the patient and verified that I have sent in the correct medication was sent to the correct pharmacy. I have sent in the medication. Nothing further needed.

## 2022-10-04 NOTE — Telephone Encounter (Signed)
Called and spoke with pt who states she had been out in the cool weather 2 days ago and believes due to being out in the cool air, she has started having more problems with coughing. Pt states that when she coughs, she is getting up green phlegm. Pt also said that she has been having pain on her right side and said when trying to lie down overnight to sleep, she was having some discomfort.  Pt denies any complaints of fever.  Pt is requesting to have meds sent in to help with her symptoms. Dr. Chase Caller, please advise on this for pt.

## 2022-10-04 NOTE — Telephone Encounter (Signed)
Called and spoke with patient. She verbalized understanding of MR's recs. RXs have been sent to her pharmacy.   Nothing further needed at time of call.

## 2022-10-04 NOTE — Telephone Encounter (Signed)
ATC patient. LVMTCB. 

## 2022-10-04 NOTE — Telephone Encounter (Signed)
  Take prednisone 40 mg daily x 2 days, then '20mg'$  daily x 2 days, then '10mg'$  daily x 2 days, then '5mg'$  daily x 2 days and stop   cephalexin '500mg'$  three times daily x  7 days   If not getting better   No Known Allergies

## 2022-10-07 ENCOUNTER — Inpatient Hospital Stay: Payer: Medicare Other | Attending: Hematology and Oncology | Admitting: Hematology and Oncology

## 2022-10-07 NOTE — Assessment & Plan Note (Deleted)
03/19/18:Left Lumpectomy: IDC Grade 1, 1.3 cm, 1/3 LN Positive, with LVI, T1cN1 ER 100%, PR 90%, Ki-67 5%, HER-2 negative Stage 1B Adjuvant radiation therapy 04/16/2018 to 05/14/2018  Current treatment:letrozole 2.5 mg daily times 5-7yearsstarted 05/15/2019 Letrozole toxicities: Denies any major adverse effects to letrozole.  Breast cancer surveillance: 1.Breast exam  10/07/2022: Benign 2.Mammogram  08/14/2022: Asymmetry right breast: Diagnostic mammogram and ultrasound: Benign breast density category C  Bone density 07/24/2022: T score -2.5: Osteoporosis (used to be -2.1): Recommended bisphosphonates along with calcium and vitamin D and weightbearing exercises.  Return to clinic in 1 year for follow-up

## 2022-10-08 ENCOUNTER — Ambulatory Visit (INDEPENDENT_AMBULATORY_CARE_PROVIDER_SITE_OTHER): Payer: Medicare Other

## 2022-10-08 ENCOUNTER — Telehealth: Payer: Self-pay

## 2022-10-08 ENCOUNTER — Encounter: Payer: Self-pay | Admitting: Internal Medicine

## 2022-10-08 ENCOUNTER — Telehealth: Payer: Self-pay | Admitting: Internal Medicine

## 2022-10-08 ENCOUNTER — Ambulatory Visit (INDEPENDENT_AMBULATORY_CARE_PROVIDER_SITE_OTHER): Payer: Medicare Other | Admitting: Internal Medicine

## 2022-10-08 VITALS — BP 132/58 | HR 91 | Temp 98.8°F | Ht 69.0 in | Wt 147.4 lb

## 2022-10-08 DIAGNOSIS — J479 Bronchiectasis, uncomplicated: Secondary | ICD-10-CM

## 2022-10-08 DIAGNOSIS — J471 Bronchiectasis with (acute) exacerbation: Secondary | ICD-10-CM

## 2022-10-08 DIAGNOSIS — R0781 Pleurodynia: Secondary | ICD-10-CM | POA: Diagnosis not present

## 2022-10-08 DIAGNOSIS — J439 Emphysema, unspecified: Secondary | ICD-10-CM | POA: Diagnosis not present

## 2022-10-08 LAB — CBC WITH DIFFERENTIAL/PLATELET
Basophils Absolute: 0.1 10*3/uL (ref 0.0–0.1)
Basophils Relative: 0.3 % (ref 0.0–3.0)
Eosinophils Absolute: 0 10*3/uL (ref 0.0–0.7)
Eosinophils Relative: 0.3 % (ref 0.0–5.0)
HCT: 34.9 % — ABNORMAL LOW (ref 36.0–46.0)
Hemoglobin: 11.4 g/dL — ABNORMAL LOW (ref 12.0–15.0)
Lymphocytes Relative: 10.5 % — ABNORMAL LOW (ref 12.0–46.0)
Lymphs Abs: 1.8 10*3/uL (ref 0.7–4.0)
MCHC: 32.7 g/dL (ref 30.0–36.0)
MCV: 89.5 fl (ref 78.0–100.0)
Monocytes Absolute: 1.8 10*3/uL — ABNORMAL HIGH (ref 0.1–1.0)
Monocytes Relative: 10.6 % (ref 3.0–12.0)
Neutro Abs: 13.1 10*3/uL — ABNORMAL HIGH (ref 1.4–7.7)
Neutrophils Relative %: 78.3 % — ABNORMAL HIGH (ref 43.0–77.0)
Platelets: 293 10*3/uL (ref 150.0–400.0)
RBC: 3.9 Mil/uL (ref 3.87–5.11)
RDW: 14 % (ref 11.5–15.5)
WBC: 16.7 10*3/uL — ABNORMAL HIGH (ref 4.0–10.5)

## 2022-10-08 LAB — HEPATIC FUNCTION PANEL
ALT: 9 U/L (ref 0–35)
AST: 13 U/L (ref 0–37)
Albumin: 3.9 g/dL (ref 3.5–5.2)
Alkaline Phosphatase: 83 U/L (ref 39–117)
Bilirubin, Direct: 0.2 mg/dL (ref 0.0–0.3)
Total Bilirubin: 0.9 mg/dL (ref 0.2–1.2)
Total Protein: 7 g/dL (ref 6.0–8.3)

## 2022-10-08 LAB — BASIC METABOLIC PANEL
BUN: 18 mg/dL (ref 6–23)
CO2: 31 mEq/L (ref 19–32)
Calcium: 9.3 mg/dL (ref 8.4–10.5)
Chloride: 103 mEq/L (ref 96–112)
Creatinine, Ser: 0.54 mg/dL (ref 0.40–1.20)
GFR: 86.65 mL/min (ref >60.00)
Glucose, Bld: 101 mg/dL — ABNORMAL HIGH (ref 70–99)
Potassium: 4 mEq/L (ref 3.5–5.1)
Sodium: 140 mEq/L (ref 135–145)

## 2022-10-08 LAB — TROPONIN I (HIGH SENSITIVITY): High Sens Troponin I: 5 ng/L (ref 2–17)

## 2022-10-08 LAB — D-DIMER, QUANTITATIVE: D-Dimer, Quant: 0.42 mcg/mL FEU (ref <0.50)

## 2022-10-08 MED ORDER — TRAMADOL HCL 50 MG PO TABS
50.0000 mg | ORAL_TABLET | Freq: Two times a day (BID) | ORAL | 0 refills | Status: DC
Start: 2022-10-08 — End: 2022-10-21

## 2022-10-08 NOTE — Patient Instructions (Addendum)
ICD-10-CM   1. Bronchiectasis with (acute) exacerbation ([place])  J47.1     2. Bronchiectasis without complication ([place])  I77.8 DG Chest 2 View    3. Pleuritic pain  R07.81       Unclera cause of left pleuritic pain  Plan   - sputum gram stain and and culture and AFB - Blood test  - quantiferon gold  - d-dimer, troponin, cbc , bmet , lft - STAT - CXR 2 view 10/08/2022 - will call with results  - ER if worse - continue  current antibiotic and prednisone course - tramadol '50mg'$  twice daily as needed  x 10 days for pain  Hiatal hernia with GERD  -The hiatal hernia is very tiny but you have  had significant acid reflux which might also be contributing to worsening respiratory symptoms. Glad this is better after starting Prilosec 2 year ago. Fosfamax makes acid reflux wore  plan -Continue over-the-counter Prilosec 20 mg daily on empty stomach - recommend change fosafamx to something else -Follow this issue with primary care physician  8 mm left lower lobe lung nodule -Stable 2017 through 2020 -No further follow-up  Follow-up 4 weeeks with APP

## 2022-10-08 NOTE — Telephone Encounter (Signed)
   D-dimer and troponin normal.  Chest x-ray without any acute abnormalities.  Chest x-ray only is chronic abnormalities.  She has new mild anemia 11.4 g% [normal hemoglobin June 2023 through 2019]  Other than the mild new anemia no other abnormalities  Plan - She can take some Tylenol and apply some heat pad for the pleuritic pain -If she can tolerate ibuprofen she should take 200-400 mg for a max of 3 times daily as needed [caution stomach pain and kidney injury] -If needed I can send a tramadol -Needs to follow with primary care about anemia -Needs to follow back with nurse practitioner in a few weeks -If getting worse go to ER   LABS    Latest Reference Range & Units 10/08/22 13:35  High Sens Troponin I 2 - 17 ng/L 5    Latest Reference Range & Units 09/30/18 16:57 10/08/22 13:35  D-Dimer, Quant <0.50 mcg/mL FEU 0.63 (H) 0.42  (H): Data is abnormally high  PULMONARY No results for input(s): "PHART", "PCO2ART", "PO2ART", "HCO3", "TCO2", "O2SAT" in the last 168 hours.  Invalid input(s): "PCO2", "PO2"  CBC Recent Labs  Lab 10/08/22 1334  HGB 11.4*  HCT 34.9*  WBC 16.7*  PLT 293.0    COAGULATION No results for input(s): "INR" in the last 168 hours.  CARDIAC  No results for input(s): "TROPONINI" in the last 168 hours. No results for input(s): "PROBNP" in the last 168 hours.   CHEMISTRY Recent Labs  Lab 10/08/22 1334  NA 140  K 4.0  CL 103  CO2 31  GLUCOSE 101*  BUN 18  CREATININE 0.54  CALCIUM 9.3   Estimated Creatinine Clearance: 58.6 mL/min (by C-G formula based on SCr of 0.54 mg/dL).   LIVER Recent Labs  Lab 10/08/22 1334  AST 13  ALT 9  ALKPHOS 83  BILITOT 0.9  PROT 7.0  ALBUMIN 3.9     INFECTIOUS No results for input(s): "LATICACIDVEN", "PROCALCITON" in the last 168 hours.   ENDOCRINE CBG (last 3)  No results for input(s): "GLUCAP" in the last 72 hours.       IMAGING x48h  - image(s) personally visualized  -   highlighted in  bold DG Chest 2 View  Result Date: 10/08/2022 CLINICAL DATA:  Bronchiectasis EXAM: CHEST - 2 VIEW COMPARISON:  03/14/2022 FINDINGS: Normal heart size, mediastinal contours, and pulmonary vascularity. New line Atherosclerotic calcification aorta. Emphysematous, chronic bronchitic, and chronic interstitial changes throughout both lungs. More focal areas of opacity at the RIGHT middle lobe and and RIGHT apex. Changes appear stable since the previous study. No definite superimposed acute infiltrate, pleural effusion, or pneumothorax. No discrete mass. Bones demineralized. IMPRESSION: Emphysematous, chronic bronchitic and chronic interstitial changes throughout both lungs, greater on RIGHT consistent with history of COPD and bronchiectasis as well as chronic interstitial lung disease. No acute abnormalities. Aortic Atherosclerosis (ICD10-I70.0) and Emphysema (ICD10-J43.9). Electronically Signed   By: Lavonia Dana M.D.   On: 10/08/2022 13:22

## 2022-10-08 NOTE — Progress Notes (Signed)
IOV 06/09/2015  Chief Complaint  Patient presents with   Pulmonary Consult    Pt referred by Dr. Tomi Bamberger for abnormal CT.    81 year old female somewhat of a poor historian. History is gained from repeatedly asking the same questions in many different ways and also review of Dr Rita Ohara notes in the EMR   She reports that she is a 40 pack smoker having quit 34 years ago. In the 1970s and 1980s she had recurrent bouts of pneumonia which left her with bronchiectasis. At baseline she's had some chronic cough of unclear severity and shortness of breath with exertion of unclear severity but probably mild. Unclear if she has associated sputum. Unclear what aggravating and relieving factors where or are. Then in for a 2016 she developed over several weeks worsening cough with green sputum and some fever. Apparently was diagnosed with pneumonia. This then resulted in a CT scan of the chest and her 2016 which I personally reviewed and shows bilateral bronchiectasis particularly on the right middle lobe. She also had a nodule. Antibody treatment she improved. Then in April 2016 was started on Brio which inhaler therapy help her cough and shortness of breath significantly. Currently these are not much much of an issue for her. She has a good quality of life with the Brio. The Brio is expensive. She had follow-up CT scan of the chest 04/26/2015 that shows resolution of the nodule but persistence of the bronchiectasis. In fact she seems to have chronic bronchiectasis dating back on several chest x-rays although it back to 2009. She had test that she's had bronchiectasis since the 1980s. Currently no sputum production not much of a cough or shortness of breath. She denies having had bronchoscopy for the same.  She is not interested in much testing  Noted spirometry pre-breo March 2016  - fev 1.14L/45%, R 56 - severe obstructive lung disease And post Breo in June 05 2015  - Fev 1.32L/52%, R 57 ->  significantly improved but still in moderate category   OV 11/06/2015  Chief Complaint  Patient presents with   Follow-up    Pt here after PFT. Pt states she respiratory infection in October and was on 2 rounds of abx and is now back to her baseline. Pt c/o mild non prod cough. Pt denies CP/tightness.    Previous heavy smoker with right middle lobe chronic bronchiectasis with findings suggestive of MAI and moderate/severe obstructive lung disease with findings of emphysema on CT chest - February and May 2016  - Last seen in June 2016. At that time he started Brio. Overall she has been doing well. However in October 2016 she had a respiratory exacerbation. She was treated with 2 rounds of anabiotic's and prednisone course and then she fell baseline. She is followed up with a pulmonary function test  t 10/30/2015) baseline and well. 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 Currently she is feeling well. Review of vaccine shows she is up-to-date with flu shot. She thinks she has had the pneumonia vaccine with her primary care physician but she's not sure and she is going to check with her.  No new issues.  OV 02/12/2016  Chief Complaint  Patient presents with   Follow-up    Pt reports having some increased breathing issues since last OV- treated a few times with abx/pred for infections. Most recent x  1 week ago- Zpak w/Pred taper. Pt notes some green mucus but cough is much improved.   Heavy smoker with right middle lobe chronic bronchiectasis and some lingular bronchiectasis and scattered infiltrates in the lower lobe based on CT February and May 2016. May 2016 with improvement   - This is a routine follow-up. Last seen November 2016. She is compliant with her inhalers. In the interim approximately on Valentine's Day 2017 had another flareup and he called in Z-Pak and prednisone. This has helped.  Cough is significantly worse but it has improved although not at baseline. She slowly getting better. This will be her third exacerbation. Review of the chart shows an visualization of the CT chest which I did myself and showed her shows findings described above. Chart review also shows that do not had a complete bronchiectasis hematological and etiological workup. She is open to having bronchoscopy   OV 04/23/2016  Chief Complaint  Patient presents with   Follow-up    Pt here after CT chest. Pt states her breathing has improved since last OV. Pt states she is still coughing in the morning but now there is less mucus production.      Heavy smoker with right middle lobe chronic bronchiectasis and some lingular bronchiectasis and scattered infiltrates in the lower lobe based on CT February and May 2016. PFT Nov 2016 -> fev 1.36L/51%, R 60. DLCO 16/51%   In  Nov 2016 I added spiriva and now on triple inhaler Rx- says with this symptoms she is significantly better. Still with cough. Some spuitum +. Labs for etiology reviewed done feb 2017 -> IgG, IgA, IgM - normal. Allpha 1AT - normal. Autoimmune - ANA, CCP, dsDNA, RF, ENA, SSA, SSB, scl-70, RNP - negative.    OV 07/01/2016  Chief Complaint  Patient presents with   Follow-up    Follow up bronchoscopy.  pt c/o R sided back pain X3 days, nonprod cough.      Idiopathic bronchiectasis. Here to review results. Underwent bronchoscopy with lavage 06/21/2016 that showed acute neutrophils 91%. Culture was negative. AFB smear is negative fungal smear was negative. AFB culture is pending. Bronchoscopy showed a lot of mucus. She says that when she lies on she has significant cough and mucus. She feels that after bronchoscopy she improved because of the removal of mucus but shortly after this for the last few days she's having right infrascapular pleuritic and off chest tightness that is consistent with previous exacerbation and she feels she needs an  antibiotic and prednisone course. She is compliant with Spiriva and Brio at this point. Overall even at baseline she admits to copious white mucus.   OV 09/16/2016  Chief Complaint  Patient presents with   Follow-up    Pt states that she does have some lung congestion, little cough and wheezing with SOB. Pt states that she is trying to learn when to "rest" and not overdo herself. Needs refills Breo and Spiriva Resp.    Follow-up idiopathic bronchiectasis. Last visit mid July 2017. After that called in 09/04/2016 with symptoms of bronchiectasis exacerbation. Called in Z-Pak and prednisone. She is back to baseline. At this point in time she tells me that she does not have daily mucus other than mild amounts. She does admit that she is noncompliant with physician with chest physical therapy. She recollects that one time she position herself in an inversion table for back therapy and this cleared of the mucus and she felt much better. She admits that  she needs to do a better job with positional therapy. She is up-to-date with flu shot. She continues on Spiriva and Brio triple inhaler therapy. There are no new issues.   OV 04/24/2017  Chief Complaint  Patient presents with   Acute Visit    Pt c/o hoarseness, prod cough with green mucus, chest tightness x 2 days. Pt denies f/c/s and increase in SOB.   Acute visit for this patient with idiopathic bronchiectasis.  December 2017 she saw Dr. Baltazar Apo with an acute exacerbation. After that she went back to baseline. She does not use chest physical therapy but she uses inhaler therapy only. Now for the last 2 days she's having increased cough with chest tightness and green mucous. Baseline is clear yellow mucus. Low-grade fever present in the office. No wheezing or increased shortness of breath. Significant hoarseness of voice present and some postnasal drip present. She feels she is in bronchiectasis exacerbation. There are no other issues. It is noted  that in July 2017 bronchoscopy did not reveal any organisms on lavage    03/17/2018 Follow up : Bronchiectasis /COPD  Returns for a follow-up for bronchiectasis/COPD .  Says that she has been doing well over the last year.  She had no increased cough or congestion.  No recent antibiotic use.  She denies any shortness of breath, hemoptysis, weight loss, edema.  She does have an occasional cough that is minimally productive.  She is very active and independent.  She remains on BREO daily .,  Does depend on samples.  Has not taken Spiriva in >1 year. Has not felt that she needed this.  Recently diagnosed left breast cancer. Plans for surgery later this week.    OV 09/02/2018  Subjective:  Patient ID: Molly Ross, female , DOB: [birth year] , age 75 y.o. , MRN: [id number] , ADDRESS: [place] [id number]   09/02/2018 -   Chief Complaint  Patient presents with   Follow-up    Pt states she has been doing good since last visit except she does have complaints of fatigue and is coughing with occ green to yellow mucus. States cough is worse in the mornings.   81 year old female former smoker followed for COPD and Bronchiectasis. 10/30/2015) baseline and well. Post broncho-dilator FEV1 1.36 L/51% and a ratio 60, total lung capacity 5.2 L/90%, DLCO 16/51%. Last CT 2016 and May 2017 and CXR aug 2019 with chronic changes    HPI Molly Ross 81 y.o. - female with bronchiectasis maintained on Brio. I personally have not seen her since May 2018. Since then she has seen my colleagues for flareupsand gotten anabiotic's of prednisone. Most recently in August 2019. Chest x-ray that time showed just chronic changes. Since that time overall she's feeling well. She is stable. She has sputum production every few days. She uses a flutter valve as needed only. Sometimes of sputum is thick. But overall stable no change in color. Does not feel she is in a flareup.She continuesl and the fluas  needed. She is not on Mucinex. She wants to have the flu shot today.he does been a few years since she had pulmonary function test.   She takes care of her sister in a wheelchair. Another sister died from ovarian cancer.   ROS - per HPI   OV 11/19/2018  Subjective:  Patient ID: Molly Ross, female , DOB: [birth year] , age 74 y.o. , MRN: [id number] , ADDRESS: [place]  [id number]   11/19/2018 -   Chief Complaint  Patient presents with   Follow-up    f/u bronchiectasis, still coughing up green mucus,      HPI Molly Ross 81 y.o. -follow bronchiectasis associated with COPD colonized with Haemophilus influenza as determined by bronchoscopy in June 2018.  Maintained on Breo and flutter valve  Last seen mid September 2019.  Since then she has had 2 exacerbations treated over the phone.  The first 1 before Halloween 2019 and the second 1 before Thanksgiving 2019.  She states that with the second 1 just before Thanksgiving 2019 she really has not recovered.  She says she feels extremely fatigued and miserable.  In fact her cough is significantly worse than baseline.  She is bringing up copious amount of green sputum daily.  She is also having chest tightness and wheezing.  Also short of breath.  There is no hemoptysis or fever or chills or orthopnea proximal nocturnal dyspnea.  Overall she feels miserable. Continues with breo and flutter valve COPD CAT score  She is not on saline nebulizer treatment or vibratory vest or Roflumilast or nebulizer or inhaled anticholinergic.   Patient has has daily productive cough lasting greater than 24 months requiring antibiotic therapy and frequent exacerbations.  Flutter valve has been tried, but has not effectively mobilized secretions.  Considered and ruled out manual CPT as no consistent skilled caregiver available to perform therapy.  CT scan performed on 04/15/2016 confirms bronchiectasis.  Meets indication for vest  therapy   OV 12/29/2018  Subjective:  Patient ID: Molly Ross, female , DOB: 1941-05-15 , age 57 y.o. , MRN: [id number] , ADDRESS: [place] [id number]   12/29/2018 -   Chief Complaint  Patient presents with   Results   follow bronchiectasis associated with COPD colonized with Haemophilus influenza as determined by bronchoscopy in June 2018 , pseudomonas on sputum Dec 2019   Maintained on Breo and flutter valve and in December 2019 added 3% saline nebulizer and Spiriva   HPI MALLORIE NORROD 81 y.o. -presents for follow-up after her November 16, 2018 visit for recurrent COPD/bronchiectasis exacerbation.  Sputum culture grew Pseudomonas and will change antibiotics to ciprofloxacin.  We introduce Spiriva, 3% saline nebulizer and also vibratory vest.  Of these measures she did the ciprofloxacin Spiriva and 3% saline nebulizer.  With this along with the Brio and flutter valve her symptoms are significantly improved.  She hardly brings out any sputum and it is clear.  She does not want to do the vibratory vest because she is worried about chest muscle soreness after her breast cancer treatment and also the $1900 co-pay which apparently is one time.  She is not doing any Mucinex.  She is not interested in cyclical suppressive antibiotic therapy at this stage.  She did have a CT scan of the chest and this the bronchiectasis is evident, and compared to 2017 8 mm left lower lobe nodule is unchanged some of the volume loss is unchanged and stable.  There is a report of coronary artery calcification which is a new finding.  She does not have any chest pain.  Her previous cardiologist is Dr. Tollie Eth and he did a stress test according to him many years ago.  She wants to see him again.  She is also worried about hiatal hernia finding in the CT scan.  The report is very small hiatal hernia.  But she does admit to significant acid  reflux for which she is not taking any treatment.   She is agreed to take Prilosec and will follow-up with primary care physician.   OV 01/17/2020  Subjective:  Patient ID: Molly Ross, female , DOB: [birth year] , age 86 y.o. , MRN: [id number] , ADDRESS: [place] [id number]   01/17/2020 -   Chief Complaint  Patient presents with   Follow-up    yearly follow up - breathing is at baseline, no new complaints   follow bronchiectasis [idiopathic work-up February 2017 with negative serology and negative immunoglobulin] associated with COPD colonized with Haemophilus influenza as determined by bronchoscopy in June 2018 , pseudomonas on sputum Dec 2019   Maintained on Breo, [place] and flutter valve and in December 2019.  Not taking 3% saline nebulizer  Normal stress Myoview in August 2016  Background history of breast cancer followed by Dr. Lindi Adie   HPI Molly Ross 81 y.o. -returns for follow-up.  Last seen in January 2020.  After that because of the pandemic 2019 with Covid she has not returned for follow-up.  She says she has been followed from her job.  Without the external exposures in the environment she has been social distancing and isolating.  Therefore because of this her respiratory symptoms are better.  Her sputum production has improved a lot.  She certainly had 1 exacerbation.  Sputum volume is less cough is much less.  She is only on [place].  She decided not to do the 3% saline nebulizer because she feels she does not need it.  She barely brings out any sputum.  She has had a first dose COVID-19 vaccine Moderna.  She will have the second dose soon    Last pulmonary function test November 2016 with FEV1 postbronchodilator 1.36/51% and DLCO 16/51  Last CT scan of the chest January 2020   ROS - per HPI  OV 10/26/2021  Subjective:  Patient ID: Molly Ross, female , DOB: 1941/06/29 , age 44 y.o. , MRN: [id number] , ADDRESS: [place] [id number] PCP Rita Ohara, MD Patient Care Team: Rita Ohara, MD as PCP - General (Family Medicine) Stark Klein, MD as Consulting Physician (General Surgery) Nicholas Lose, MD as Consulting Physician (Hematology and Oncology) Eppie Gibson, MD as Attending Physician (Radiation Oncology) Gardenia Phlegm, NP as Nurse Practitioner (Hematology and Oncology) Jacolyn Reedy, MD as Consulting Physician (Cardiology)  This Provider for this visit: Treatment Team:  Attending Provider: Brand Males, MD    10/26/2021 -   Chief Complaint  Patient presents with   Follow-up    Pt states she has been doing good since last visit. States she has had one flare up since last visit but has been doing okay.   follow bronchiectasis [idiopathic work-up February 2017 with negative serology and negative immunoglobulin] associated with COPD colonized with Haemophilus influenza as determined by bronchoscopy in June 2018 , pseudomonas on sputum Dec 2019   Maintained on Breo, [place] and flutter valve and in December 2019.  Not taking 3% saline nebulizer  Normal stress Myoview in August 2016  Background history of breast cancer followed by Dr. Lindi Adie  HPI Molly Ross 81 y.o. -follow-up bronchiectasis.  Personally not since early 2021.  She was supposed to establish care with Dr. Carlis Abbott on 04 colleagues who specialize in bronchiectasis.  But Dr. Carlis Abbott is now doing only critical care medicine.  Therefore she is back to see me.  In  August 2022 she had COVID-19.  After that she had 1 flareup of bronchiectasis.  Overall however since the pandemic started she has been masking and social distancing and taking Spiriva and Breo.  This is provided excellent symptom control.  She does not have much symptoms.  Not much sputum.  She continues flutter valve.  She is not on oxygen.  She is not on vibratory vest.  She is not on Roflumilast.  She is not on 3% saline nebulizer.  Sputum production is not much.   She agreed to have the  flu shot today but she declined COVID-vaccine.  She had natural COVID in August 2022.    03/14/2022 Follow up : Bronchiectasis  and COPD  Returns for 4 month follow up . Says was doing better until 4 weeks ago , she fell at home with right sided rib fractures. Went to ER , Chest xray showed minimally displaced right 9/10 rib fractures. Says very sore , sleeping in recliner. No longer taking pain meds. Cough was increased until this week. Worried that she still has pain.Bruising has resolved but still very sore in right ribs and back.   Remains on Stiolto  daily . Uses flutter valve but not able to last few weeks. Not using nebs currently . Has started incentive spirometry . Had Covid 19 infection in January 2023 . Improved with Paxlovid. Cough is at baseline .No fever, hemoptysis , weight loss.  Appetite is fair.        OV 07/05/2022  Subjective:  Patient ID: Molly Ross, female , DOB: 1941-08-05 , age 18 y.o. , MRN: [id number] , ADDRESS: [place] [id number] PCP Rita Ohara, MD Patient Care Team: Rita Ohara, MD as PCP - General (Family Medicine) Stark Klein, MD as Consulting Physician (General Surgery) Nicholas Lose, MD as Consulting Physician (Hematology and Oncology) Eppie Gibson, MD as Attending Physician (Radiation Oncology) Delice Bison Charlestine Massed, NP as Nurse Practitioner (Hematology and Oncology) Brand Males, MD as Consulting Physician (Pulmonary Disease) Richardo Priest, MD as Consulting Physician (Cardiology) Otis Brace, MD as Consulting Physician (Gastroenterology) Delsa Sale, OD (Optometry) Alanda Slim, Neena Rhymes, MD as Consulting Physician (Ophthalmology) Ulis Rias, DMD (Dentistry)  This Provider for this visit: Treatment Team:  Attending Provider: Brand Males, MD    07/05/2022 -   Chief Complaint  Patient presents with   Follow-up    Follow-up    HPI Molly Ross 81 y.o. -returns for  follow-up.  She is doing well.  In between she is seen the nurse practitioner.  In the interim her sister passed away.  I used to know her sister.  Her sister went to SNF 3 years ago and then recently had aspiration and passed away.  She says in February 19, 2022 she fell down and fractured her ribs and after that was in the recliner.  Being in the recliner helped her because she avoided going outside.  Currently working at [employer] and able to do it.  She does get tired a little bit.  She is on Stiolto and she likes it.  She is asking for samples but we only had [place] samples.  She is not that compliant with the flutter valve because she feels she does not need it.  Overall stable she had pulmonary function test which I believe shows stability/slight age-related decline I went over the results with her and visualized the image myself    CT Chest data  No results found.  OV 10/08/2022  Subjective:  Patient ID: Molly Ross, female , DOB: 07-Jan-1941 , age 55 y.o. , MRN: [id number] , ADDRESS: [place] [id number] PCP Rita Ohara, MD Patient Care Team: Rita Ohara, MD as PCP - General (Family Medicine) Stark Klein, MD as Consulting Physician (General Surgery) Nicholas Lose, MD as Consulting Physician (Hematology and Oncology) Eppie Gibson, MD as Attending Physician (Radiation Oncology) Delice Bison, Charlestine Massed, NP as Nurse Practitioner (Hematology and Oncology) Brand Males, MD as Consulting Physician (Pulmonary Disease) Richardo Priest, MD as Consulting Physician (Cardiology) Otis Brace, MD as Consulting Physician (Gastroenterology) Delsa Sale, [place] (Optometry) Alanda Slim, Neena Rhymes, MD as Consulting Physician (Ophthalmology) Ulis Rias, DMD (Dentistry)  This Provider for this visit: Treatment Team:  Attending Provider: Chesley Mires, MD    10/08/2022 -   Chief Complaint  Patient presents with   Acute Visit    PT states she has  pain in left lung (started this AM)    follow bronchiectasis [idiopathic work-up February 2017 with negative serology and negative immunoglobulin] 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  Not taking 3% saline nebulizer  Normal stress Myoview in August 2016  Background history of breast cancer followed by Dr. Lindi Adie  HPI Molly Ross 81 y.o. -    CT Chest data  No results found.    PFT     Latest Ref Rng & Units 07/05/2022    9:42 AM 10/30/2015    4:38 PM  PFT Results  FVC-Pre L 2.03  2.13   FVC-Predicted Pre % 62  61   FVC-Post L 2.01  2.26   FVC-Predicted Post % 61  64   Pre FEV1/FVC % % 58  60   Post FEV1/FCV % % 58  60   FEV1-Pre L 1.17  1.27   FEV1-Predicted Pre % 47  48   FEV1-Post L 1.17  1.36   DLCO uncorrected ml/min/mmHg 12.66  16.04   DLCO UNC% % 57  51   DLCO corrected ml/min/mmHg 12.95    DLCO COR %Predicted % 58    DLVA Predicted % 110  83   TLC L  5.24   TLC % Predicted %  90   RV % Predicted %  118        has a past medical history of Atherosclerosis of aorta ([place]) (02/02/2015), Bronchiectasis ([place]), Bronchiectasis with (acute) exacerbation ([place]) (06/09/2015), CAD (coronary artery disease), native coronary artery, Cancer ([place]), Cellulitis of left breast (10/01/2018), Colon polyp, COPD, moderate ([place]) (11/06/2015), Coronary artery calcification seen on CT scan, Depressive disorder, not elsewhere classified (12/04/2011), Dyspnea, Emphysema of lung ([place]) (02/02/2015), Family history of lung cancer, Family history of ovarian cancer, Family history of prostate cancer, Genetic testing (03/27/2018), GERD (gastroesophageal reflux disease), Hiatal hernia (02/02/2015), History of radiation therapy (04/16/18- 05/14/18), History of smoking 25-50 pack years (06/09/2015), Malignant neoplasm of lower-inner quadrant  of left breast in female, estrogen receptor positive ([place]) (03/06/2018), Osteopenia (02/18/2018), Personal history of radiation therapy (2019), Pleuritic pain (10/01/2018), Shingles (1/09, 07/2010, 11/2017), Unspecified vitamin D deficiency (02/2011 and 2016), and Vitamin D deficiency (02/14/2011).   reports that she quit smoking about 42 years ago. Her smoking use included cigarettes. She has a 48.00 pack-year smoking history. She has never used smokeless tobacco.  Past Surgical History:  Procedure Laterality Date  BREAST EXCISIONAL BIOPSY Right    benign years ago   BREAST LUMPECTOMY Left 2019   BREAST LUMPECTOMY WITH RADIOACTIVE SEED AND SENTINEL LYMPH NODE BIOPSY Left 03/19/2018   Procedure: BREAST LUMPECTOMY WITH RADIOACTIVE SEED AND SENTINEL LYMPH NODE BIOPSY;  Surgeon: Stark Klein, MD;  Location: [facility];  Service: General;  Laterality: Left;   CATARACT EXTRACTION, BILATERAL Bilateral 03/2017, 04/2017   [facility], Dr. Alanda Slim   SHOULDER SURGERY  03/2011   for frozen shoulder, and cyst removal (Dr. Percell Miller)   TONSILLECTOMY  child   VIDEO BRONCHOSCOPY Bilateral 06/21/2016   Procedure: VIDEO BRONCHOSCOPY WITHOUT FLUORO;  Surgeon: Brand Males, MD;  Location: [facility];  Service: Cardiopulmonary;  Laterality: Bilateral;    No Known Allergies  Immunization History  Administered Date(s) Administered   Fluad Quad(high Dose 65+) 08/25/2019, 09/07/2020, 10/26/2021, 08/26/2022   Influenza, High Dose Seasonal PF 01/26/2014, 10/19/2014, 08/30/2015, 08/08/2016, 08/05/2017, 09/02/2018   Moderna Sars-Covid-2 Vaccination 12/29/2019, 01/26/2020   Pneumococcal Conjugate-13 12/25/2015   Pneumococcal Polysaccharide-23 12/30/2016   Tdap 09/03/2017   Zoster Recombinat (Shingrix) 03/25/2021    Family History  Problem Relation Age of Onset   Stroke Mother    Diabetes Mother    Lung cancer Father 56   Stroke Sister 54       brainstem stroke   Diabetes Sister    Seizures Sister     Ovarian cancer Sister 39       metastatic to lung, liver   Cancer Sister    Heart disease Maternal Grandmother    Heart disease Paternal Grandmother    Ovarian cancer Maternal Aunt 95   Prostate cancer Cousin    Lung cancer Cousin        recurrent, now stage 4   Breast cancer Neg Hx      Current Outpatient Medications:    alendronate (FOSAMAX) 70 MG tablet, Take 1 tablet (70 mg total) by mouth every 7 (seven) days. Take with a full glass of water on an empty stomach., Disp: 12 tablet, Rfl: 3   Ascorbic Acid (VITAMIN C) 100 MG tablet, Take 1 tablet (100 mg total) by mouth daily., Disp: , Rfl:    aspirin 81 MG EC tablet, Take 1 tablet (81 mg total) by mouth daily. Swallow whole., Disp: 1 tablet, Rfl: 0   atorvastatin (LIPITOR) 20 MG tablet, TAKE 1 TABLET BY MOUTH EVERY DAY, Disp: 90 tablet, Rfl: 2   Calcium-Magnesium-Vitamin D (CALCIUM 500 PO), Take 1 tablet by mouth daily., Disp: , Rfl:    cephALEXin (KEFLEX) 500 MG capsule, Take 1 capsule (500 mg total) by mouth 3 (three) times daily., Disp: 21 capsule, Rfl: 0   cholecalciferol (VITAMIN D3) 25 MCG (1000 UNIT) tablet, Take 1,000 Units by mouth daily., Disp: , Rfl:    letrozole (FEMARA) 2.5 MG tablet, TAKE 1 TABLET BY MOUTH EVERY DAY, Disp: 30 tablet, Rfl: 0   Multiple Vitamins-Minerals (MULTIVITAMIN WITH MINERALS) tablet, Take 1 tablet by mouth daily., Disp: , Rfl:    omeprazole (PRILOSEC OTC) 20 MG tablet, Take 20 mg by mouth daily., Disp: , Rfl:    predniSONE (DELTASONE) 10 MG tablet, Take 4 tablets (40 mg total) by mouth daily for 2 days, THEN 2 tablets (20 mg total) daily for 2 days, THEN 1 tablet (10 mg total) daily for 2 days, THEN 0.5 tablets (5 mg total) daily for 2 days., Disp: 15 tablet, Rfl: 0   Tiotropium Bromide-Olodaterol (STIOLTO RESPIMAT) 2.5-2.5 MCG/ACT AERS, Inhale 2 puffs into the lungs  daily., Disp: 4 g, Rfl: 5   umeclidinium-vilanterol (ANORO ELLIPTA) 62.5-25 MCG/ACT AEPB, Inhale 1 puff into the lungs daily., Disp: 7  each, Rfl: 0   nitroGLYCERIN (NITROSTAT) 0.4 MG SL tablet, Place 1 tablet (0.4 mg total) under the tongue every 5 (five) minutes as needed. (Patient not taking: Reported on 12/05/2021), Disp: 25 tablet, Rfl: 6      Objective:   Vitals:   10/08/22 1157  BP: (!) 132/58  Pulse: 91  Temp: 98.8 F (37.1 C)  TempSrc: Oral  SpO2: 96%  Weight: 147 lb 6.4 oz (66.9 kg)  Height: '5\' 9"'$  (1.753 m)    Estimated body mass index is 21.77 kg/m as calculated from the following:   Height as of this encounter: '5\' 9"'$  (1.753 m).   Weight as of this encounter: 147 lb 6.4 oz (66.9 kg).  '@WEIGHTCHANGE'$ @  [employer]   10/08/22 1157  Weight: 147 lb 6.4 oz (66.9 kg)     Physical Exam  General Appearance:    Alert, cooperative, no distress, appears stated age - *** , Deconditioned looking - *** , OBESE  - ***, Sitting on Wheelchair -  ***  Head:    Normocephalic, without obvious abnormality, atraumatic  Eyes:    PERRL, conjunctiva/corneas clear,  Ears:    Normal TM's and external ear canals, both ears  Nose:   Nares normal, septum midline, mucosa normal, no drainage    or sinus tenderness. OXYGEN ON  - *** . Patient is @ ***   Throat:   Lips, mucosa, and tongue normal; teeth and gums normal. Cyanosis on lips - ***  Neck:   Supple, symmetrical, trachea midline, no adenopathy;    thyroid:  no enlargement/tenderness/nodules; no carotid   bruit or JVD  Back:     Symmetric, no curvature, ROM normal, no CVA tenderness  Lungs:     Distress - *** , Wheeze ***, Barrell Chest - ***, Purse lip breathing - ***, Crackles - ***   Chest Wall:    No tenderness or deformity.    Heart:    Regular rate and rhythm, S1 and S2 normal, no rub   or gallop, Murmur - ***  Breast Exam:    NOT DONE  Abdomen:     Soft, non-tender, bowel sounds active all four quadrants,    no masses, no organomegaly. Visceral obesity - ***  Genitalia:   NOT DONE  Rectal:   NOT DONE  Extremities:   Extremities - normal, Has Cane - ***,  Clubbing - ***, Edema - ***  Pulses:   2+ and symmetric all extremities  Skin:   Stigmata of Connective Tissue Disease - ***  Lymph nodes:   Cervical, supraclavicular, and axillary nodes normal  Psychiatric:  Neurologic:   Pleasant - ***, Anxious - ***, Flat affect - ***  CAm-ICU - neg, Alert and Oriented x 3 - yes, Moves all 4s - yes, Speech - normal, Cognition - intact    General: No distress. *** Neuro: Alert and Oriented x 3. GCS 15. Speech normal Psych: Pleasant Resp:  Barrel Chest - ***.  Wheeze - ***, Crackles - ***, No overt respiratory distress CVS: Normal heart sounds. Murmurs - *** Ext: Stigmata of Connective Tissue Disease - *** HEENT: Normal upper airway. PEERL +. No post nasal drip        Assessment:       ICD-10-CM   1. Bronchiectasis without complication ([place])  W09.8 DG Chest 2 View  Plan:     Patient Instructions  Bronchiectasis without complication ([place])  -Stable overall without any flareup -Doing well on Stiolto -Noted that you subjectively do not feel the need to use flutter valve - s/p covid in aug 2022  Plan -Continue stiolto 2 puff once daily  -No samples available so given new sample of Anoro for a month -Hold off on 3% saline nebulizer, roflumilast and vibratory vest because it seems you are not needing it and the symptoms are better -Continue flutter valve 2-4 times daily as needed -Over-the-counter Mucinex DM twice daily as needed   [facility] counseling Flu vaccine need  - respect deferral on covid vaccine - Aug 2022 natural covid gives you natural immunity for 3-12 months  -Consider high-dose flu shot in the fall 2023 - Consider new RSV vaccine    Hiatal hernia with GERD  -The hiatal hernia is very tiny but you have significant acid reflux which might also be contributing to worsening respiratory symptoms  -Glad this is better after starting Prilosec 2 year ago  plan -Continue over-the-counter Prilosec 20 mg daily on  empty stomach -Follow this issue with primary care physician  8 mm left lower lobe lung nodule -Stable 2017 through 2020 -No further follow-up  Follow-up 6 months with Dr Chase Caller in our practice for bronchiectasis       SIGNATURE    Dr. Brand Males, M.D., F.C.C.P,  Pulmonary and Critical Care Medicine Staff Physician, [place] Director - Interstitial Lung Disease  Program  [facility] [place], [place], [address]  Pager: [phone], If no answer or between  15:00h - 7:00h: call [phone] Telephone: [phone]  12:04 PM 10/08/2022

## 2022-10-08 NOTE — Telephone Encounter (Signed)
I spoke with the pt  She is c/o "lung pain"- hurts to take a breath  She was given abx recently and not improving  Acute visit with Dr Halford Chessman today at 10 am

## 2022-10-09 ENCOUNTER — Telehealth: Payer: Self-pay | Admitting: Internal Medicine

## 2022-10-09 ENCOUNTER — Ambulatory Visit: Payer: Medicare Other | Admitting: Pulmonary Disease

## 2022-10-09 NOTE — Telephone Encounter (Signed)
See other encounter from 10/24.

## 2022-10-13 LAB — QUANTIFERON-TB GOLD PLUS
Mitogen-NIL: 6.67 IU/mL
NIL: 0.02 IU/mL
QuantiFERON-TB Gold Plus: NEGATIVE
TB1-NIL: 0.01 IU/mL
TB2-NIL: 0 IU/mL

## 2022-10-17 NOTE — Telephone Encounter (Signed)
Will close encounter

## 2022-10-18 ENCOUNTER — Telehealth: Payer: Self-pay | Admitting: Internal Medicine

## 2022-10-18 DIAGNOSIS — R0781 Pleurodynia: Secondary | ICD-10-CM

## 2022-10-18 DIAGNOSIS — R059 Cough, unspecified: Secondary | ICD-10-CM

## 2022-10-18 NOTE — Telephone Encounter (Signed)
Lab work from 10/08/2022 shows normal D-dimer and normal eosinophils  She needs to get CT chest without contrast to understand the pain Also give azithromycin Z-Pak And tramadol 50 mg 3 times daily as needed x2 weeks and 1 refill     No Known Allergies

## 2022-10-18 NOTE — Telephone Encounter (Signed)
Spoke with pt who states she is still having Right lung pain and a dry cough. Pt finished ABT about 1 week ago and has finished her prednisone as well. Pt is requesting more ABT. Dr. Chase Caller please advise.

## 2022-10-21 MED ORDER — AZITHROMYCIN 250 MG PO TABS: ORAL_TABLET | ORAL | 0 refills | Status: DC

## 2022-10-21 MED ORDER — TRAMADOL HCL 50 MG PO TABS: 50.0000 mg | ORAL_TABLET | Freq: Three times a day (TID) | ORAL | 1 refills | Status: DC | PRN

## 2022-10-21 NOTE — Telephone Encounter (Signed)
Called and spoke with pt letting her know the info per MR and she verbalized understanding. Abx sent to pharmacy and tramadol called into pharmacy and order for CT has been placed. Nothing further needed.

## 2022-11-01 ENCOUNTER — Other Ambulatory Visit: Payer: Self-pay | Admitting: Family Medicine

## 2022-11-01 DIAGNOSIS — I700 Atherosclerosis of aorta: Secondary | ICD-10-CM

## 2022-11-13 ENCOUNTER — Ambulatory Visit: Payer: Medicare Other | Admitting: Primary Care

## 2022-11-13 NOTE — Progress Notes (Deleted)
_0  ID: Molly Ross, female    DOB: January 24, 1941, 81 y.o.   MRN: [id number]  No chief complaint on file.   Referring provider: Rita Ohara, MD  HPI:  10/08/2022 -   Chief Complaint  Patient presents with   Acute Visit    PT states she has pain in left lung (started this AM)   follow bronchiectasis [idiopathic work-up February 2017 with negative serology and negative immunoglobulin] 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  Not taking 3% saline nebulizer  Normal stress Myoview in August 2016  Background history of breast cancer followed by Dr. Venda Rodes 81 y.o. -acute visit.  She tells me that between September 17, 2022 and September 30, 2022 she was in [place] because a friend passed away and was a Transport planner.  She was in a tent with a lot of people.  Then when she came back she started having some green sputum and right-sided lung pain.  She called [place] and we prescribed cephalexin and prednisone which she is on currently.  Those symptoms are improving but this morning she woke up with abrupt pleuritic pain on her left lung is excruciating.  Denies any injuries.  Describes classic symptoms of pleurisy.  It is in the left upper lobe area.  But she is pointing to the precordial area.  There is no hemoptysis.  No rib fractures.  She is not in respiratory distress.   11/13/2022- interim hx  Patient presents today for 4 week follow-up.  She was felt to be stable are her last visit and recommended to fu in 6 months  Called our office with complaints of new pleuritic pain in October  CT scheduled for 11/20/22       No Known Allergies  Immunization History  Administered Date(s) Administered   Fluad Quad(high Dose 65+) 08/25/2019, 09/07/2020, 10/26/2021, 08/26/2022   Influenza, High Dose Seasonal PF  01/26/2014, 10/19/2014, 08/30/2015, 08/08/2016, 08/05/2017, 09/02/2018   Moderna Sars-Covid-2 Vaccination 12/29/2019, 01/26/2020   Pneumococcal Conjugate-13 12/25/2015   Pneumococcal Polysaccharide-23 12/30/2016   Tdap 09/03/2017   Zoster Recombinat (Shingrix) 03/25/2021    Past Medical History:  Diagnosis Date   Atherosclerosis of aorta ([place]) 02/02/2015   Noted on imaging    Bronchiectasis ([place])    Bronchiectasis with (acute) exacerbation ([place]) 06/09/2015   CAD (coronary artery disease), native coronary artery    Coronary calcification noted on CT scan 2016  Negative treadmill myoview 07/20/15 with EF 76%    Cancer ([place])    Cellulitis of left breast 10/01/2018   Colon polyp    COPD, moderate ([place]) 11/06/2015   Coronary artery calcification seen on CT scan    Coronary calcification noted on CT scan 2016  Negative treadmill myoview 07/20/15 with EF 76%    Depressive disorder, not elsewhere classified 12/04/2011   Dyspnea    Emphysema of lung ([place]) 02/02/2015   Family history of lung cancer    Family history of ovarian cancer    Family history of prostate cancer    Genetic testing 03/27/2018   The Common Hereditary Cancer Panel offered by [facility] includes sequencing and/or deletion duplication testing of the following 47 genes: APC, ATM, AXIN2, BARD1, BMPR1A, BRCA1, BRCA2, BRIP1, CDH1, CDKN2A (p14ARF), CDKN2A (p16INK4a), CKD4, CHEK2, CTNNA1, DICER1, EPCAM (Deletion/duplication testing only), GREM1 (promoter region deletion/duplication testing only), KIT,  MEN1, MLH1, MSH2, MSH3, MSH6, MU   GERD (gastroesophageal reflux disease)    Hiatal hernia 02/02/2015   small, noted on CT of chest    History of radiation therapy 04/16/18- 05/14/18   42.56 Gy directed to the Left Breast in 16 fractions followed by a boost of 8 Gy in 4 fractions   History of smoking 25-50 pack years 06/09/2015   Malignant neoplasm of lower-inner quadrant of left breast in female, estrogen receptor positive ([place]) 03/06/2018    Osteopenia 02/18/2018   DEXA 02/2018, T-1.8 R femoral neck   Personal history of radiation therapy 2019   Pleuritic pain 10/01/2018   Shingles 1/09, 07/2010, 11/2017   Unspecified vitamin D deficiency 02/2011 and 2016   Vitamin D deficiency 02/14/2011    Tobacco History: Social History   Tobacco Use  Smoking Status Former   Packs/day: 2.00   Years: 24.00   Total pack years: 48.00   Types: Cigarettes   Quit date: 06/28/1980   Years since quitting: 42.4  Smokeless Tobacco Never  Tobacco Comments   smoked 1-2 PPD x 24 years;   Counseling given: Not Answered Tobacco comments: smoked 1-2 PPD x 24 years;   Outpatient Medications Prior to Visit  Medication Sig Dispense Refill   alendronate (FOSAMAX) 70 MG tablet Take 1 tablet (70 mg total) by mouth every 7 (seven) days. Take with a full glass of water on an empty stomach. 12 tablet 3   Ascorbic Acid (VITAMIN C) 100 MG tablet Take 1 tablet (100 mg total) by mouth daily.     aspirin 81 MG EC tablet Take 1 tablet (81 mg total) by mouth daily. Swallow whole. 1 tablet 0   atorvastatin (LIPITOR) 20 MG tablet TAKE 1 TABLET BY MOUTH EVERY DAY 90 tablet 0   azithromycin (ZITHROMAX) 250 MG tablet Take two today and then one daily until finished. 6 tablet 0   Calcium-Magnesium-Vitamin D (CALCIUM 500 PO) Take 1 tablet by mouth daily.     cephALEXin (KEFLEX) 500 MG capsule Take 1 capsule (500 mg total) by mouth 3 (three) times daily. 21 capsule 0   cholecalciferol (VITAMIN D3) 25 MCG (1000 UNIT) tablet Take 1,000 Units by mouth daily.     letrozole (FEMARA) 2.5 MG tablet TAKE 1 TABLET BY MOUTH EVERY DAY 30 tablet 0   Multiple Vitamins-Minerals (MULTIVITAMIN WITH MINERALS) tablet Take 1 tablet by mouth daily.     nitroGLYCERIN (NITROSTAT) 0.4 MG SL tablet Place 1 tablet (0.4 mg total) under the tongue every 5 (five) minutes as needed. (Patient not taking: Reported on 12/05/2021) 25 tablet 6   omeprazole (PRILOSEC OTC) 20 MG tablet Take 20 mg by mouth  daily.     Tiotropium Bromide-Olodaterol (STIOLTO RESPIMAT) 2.5-2.5 MCG/ACT AERS Inhale 2 puffs into the lungs daily. 4 g 5   traMADol (ULTRAM) 50 MG tablet Take 1 tablet (50 mg total) by mouth 3 (three) times daily as needed. 42 tablet 1   umeclidinium-vilanterol (ANORO ELLIPTA) 62.5-25 MCG/ACT AEPB Inhale 1 puff into the lungs daily. 7 each 0   No facility-administered medications prior to visit.      Review of Systems  Review of Systems   Physical Exam  There were no vitals taken for this visit. Physical Exam   Lab Results:  CBC    Component Value Date/Time   WBC 16.7 (H) 10/08/2022 1334   RBC 3.90 10/08/2022 1334   HGB 11.4 (L) 10/08/2022 1334   HGB 12.7 05/28/2022 0852   HCT 34.9 (  L) 10/08/2022 1334   HCT 39.5 05/28/2022 0852   PLT 293.0 10/08/2022 1334   PLT 330 05/28/2022 0852   MCV 89.5 10/08/2022 1334   MCV 88 05/28/2022 0852   MCH 28.3 05/28/2022 0852   MCH 29.4 08/21/2020 1109   MCHC 32.7 10/08/2022 1334   RDW 14.0 10/08/2022 1334   RDW 12.2 05/28/2022 0852   LYMPHSABS 1.8 10/08/2022 1334   LYMPHSABS 1.5 05/28/2022 0852   MONOABS 1.8 (H) 10/08/2022 1334   EOSABS 0.0 10/08/2022 1334   EOSABS 0.1 05/28/2022 0852   BASOSABS 0.1 10/08/2022 1334   BASOSABS 0.1 05/28/2022 0852    BMET    Component Value Date/Time   NA 140 10/08/2022 1334   NA 143 06/10/2022 0956   K 4.0 10/08/2022 1334   CL 103 10/08/2022 1334   CO2 31 10/08/2022 1334   GLUCOSE 101 (H) 10/08/2022 1334   BUN 18 10/08/2022 1334   BUN 17 06/10/2022 0956   CREATININE 0.54 10/08/2022 1334   CREATININE 0.69 03/11/2018 0848   CREATININE 0.56 (L) 09/01/2017 1307   CALCIUM 9.3 10/08/2022 1334   GFRNONAA 91 09/07/2020 1105   GFRNONAA >60 03/11/2018 0848   GFRAA 105 09/07/2020 1105   GFRAA >60 03/11/2018 0848    BNP    Component Value Date/Time   BNP 20.3 07/28/2013 1647    ProBNP No results found for: "PROBNP"  Imaging: No results found.   Assessment & Plan:   No  problem-specific Assessment & Plan notes found for this encounter.     Martyn Ehrich, NP 11/13/2022

## 2022-11-20 ENCOUNTER — Ambulatory Visit
Admission: RE | Admit: 2022-11-20 | Discharge: 2022-11-20 | Disposition: A | Discharge status: Home / Self Care | Payer: Medicare Other | Source: Ambulatory Visit | Attending: Internal Medicine | Admitting: Internal Medicine

## 2022-11-20 ENCOUNTER — Other Ambulatory Visit: Payer: Medicare Other

## 2022-11-20 DIAGNOSIS — R059 Cough, unspecified: Secondary | ICD-10-CM

## 2022-11-20 DIAGNOSIS — I700 Atherosclerosis of aorta: Secondary | ICD-10-CM | POA: Diagnosis not present

## 2022-11-20 DIAGNOSIS — J479 Bronchiectasis, uncomplicated: Secondary | ICD-10-CM | POA: Diagnosis not present

## 2022-11-20 DIAGNOSIS — R0781 Pleurodynia: Secondary | ICD-10-CM

## 2022-12-02 ENCOUNTER — Other Ambulatory Visit: Payer: Self-pay | Admitting: Internal Medicine

## 2022-12-02 ENCOUNTER — Telehealth: Payer: Self-pay | Admitting: Internal Medicine

## 2022-12-02 MED ORDER — SODIUM CHLORIDE 3 % IN NEBU: INHALATION_SOLUTION | RESPIRATORY_TRACT | 12 refills | Status: DC | PRN

## 2022-12-02 MED ORDER — STIOLTO RESPIMAT 2.5-2.5 MCG/ACT IN AERS: 2.0000 | INHALATION_SPRAY | Freq: Every day | RESPIRATORY_TRACT | 5 refills | Status: DC

## 2022-12-02 NOTE — Telephone Encounter (Signed)
Also would like refill on Neb meds. Grandaughter has Covid. Concerned.

## 2022-12-02 NOTE — Telephone Encounter (Signed)
Called and spoke with patient, advised I would send in a refill for her stiolto and 3% nebulizer solution.  She states she feels like she needs it right now.  Her granddaughter tested positive for covid, but is better now.  She denies any fever, chills, body aches, headache, diarrhea, loss of taste or smell, increase sob or cough with mucous.  I advised her that if she develops any symptoms to get a home covid test and if she tests positive to call and let us know so she can be prescribed an antiviral.  She has not received the results for her CT chest that was done on 12/6 and would like the results.  I advised her that I would send a message to Dr. Romilda Garret and that someone will call her with the results.  She verified understanding.  Dr. Chase Caller, please advise regarding the results of the CT chest results.  Thank you.

## 2022-12-02 NOTE — Telephone Encounter (Signed)
Yes please do send the Stiolto and 3% nebulizer  Regarding the CT chest I am sorry for the delay here.  The CT scan shows progression in her infiltrates.  I think she needs another bronchoscopy because it has been many years since we did one.  The reason for the bronchoscopy would be to see if the organisms have changed.  Also to clear up any mucus.  It is not urgent but I think she should get it done in the next for 8 weeks  xxxxxxxxxxxxxxxxxxxxxx  CLINICAL DATA:  Persistent cough.   EXAM: CT CHEST WITHOUT CONTRAST   TECHNIQUE: Multidetector CT imaging of the chest was performed following the standard protocol without IV contrast.   RADIATION DOSE REDUCTION: This exam was performed according to the departmental dose-optimization program which includes automated exposure control, adjustment of the mA and/or kV according to patient size and/or use of iterative reconstruction technique.   COMPARISON:  None Available.  12/18/2018   FINDINGS: Cardiovascular: The heart size is normal. No substantial pericardial effusion. Coronary artery calcification is evident. Mild atherosclerotic calcification is noted in the wall of the thoracic aorta.   Mediastinum/Nodes: No mediastinal lymphadenopathy. No evidence for gross hilar lymphadenopathy although assessment is limited by the lack of intravenous contrast on the current study. Small hiatal hernia noted. The esophagus has normal imaging features. There is no axillary lymphadenopathy.   Lungs/Pleura: Biapical pleuroparenchymal scarring is similar to prior. Mild bronchiectasis with tree-in-bud nodularity posterior right upper lobe is stable. Widespread bronchial wall thickening and mild bronchiectasis is seen in all lobes of both lungs, as before. This is associated with volume loss and peripheral airway impaction in the right middle lobe and lingula, similar to prior. Tree-in-bud opacity in the periphery of the right lower lobe (image  90/3) is progressive in the interval since the prior study. Dominant nodule in the left lower lobe measured previously at 8 mm is 8 mm again today (107/3) compatible with benign etiology. No pleural effusion.   Upper Abdomen: Unremarkable.   Musculoskeletal: No worrisome lytic or sclerotic osseous abnormality.   IMPRESSION: 1. Interval progression of tree-in-bud opacity in the periphery of the right lower lobe. Imaging features are compatible with infectious/inflammatory etiology including atypical infection. 2. Widespread bronchial wall thickening and mild bronchiectasis in all lobes of both lungs, as before. This is associated with volume loss and peripheral airway impaction in the right middle lobe and lingula, similar to prior and compatible with sequelae of atypical infection. 3. Stable 8 mm left lower lobe pulmonary nodule compatible with benign etiology. 4. Small hiatal hernia. 5.  Aortic Atherosclerosis (ICD10-I70.0).     Electronically Signed   By: Misty Stanley M.D.   On: 11/21/2022 16:36

## 2022-12-02 NOTE — Telephone Encounter (Signed)
PT says she had a rad/Scan taken 12/6 but no one has called her w/results.   Also needs refills on 2 meds. She is out of [employer] [facility].  [facility] [address].

## 2022-12-03 NOTE — Telephone Encounter (Signed)
I called and spoke with the pt and notified of response per MR  She verbalized understanding  She was agreeable to bronch and would like to go ahead and get this scheduled

## 2022-12-11 NOTE — Telephone Encounter (Signed)
Seems like encounter was open in error so closing encounter.  

## 2022-12-12 ENCOUNTER — Ambulatory Visit: Payer: Medicare Other | Admitting: Family Medicine

## 2023-01-06 ENCOUNTER — Telehealth: Payer: Self-pay | Admitting: Internal Medicine

## 2023-01-06 MED ORDER — PREDNISONE 10 MG PO TABS: ORAL_TABLET | ORAL | 0 refills | Status: DC

## 2023-01-06 MED ORDER — CEPHALEXIN 500 MG PO CAPS: 500.0000 mg | ORAL_CAPSULE | Freq: Three times a day (TID) | ORAL | 0 refills | Status: DC

## 2023-01-06 NOTE — Telephone Encounter (Signed)
She is having repeated flareup of her bronchiectasis  Plan - Please take Take prednisone '40mg'$  once daily x 3 days, then '30mg'$  once daily x 3 days, then '20mg'$  once daily x 3 days, then prednisone '10mg'$  once daily  x 3 days and stop  -Cephalexin 500 mg 3 times daily x 7 days   -Can she come in anytime and give sputum for Gram stain and culture and AFB  -Please send a message back so I can schedule a bronchoscopy with lavage [previously discussed a month ago]   No Known Allergies

## 2023-01-06 NOTE — Telephone Encounter (Signed)
Spoke with pt and reviewed Dr. Golden Pop advice along with medication education. Pt verified pharmacy and stated understanding.   Pt is also interested is setting up bronch with Dr. Chase Caller but has not heard any further information about that. Dr. Birdena Jubilee is interested is scheduling bronch for pt please enter order along with dot phrase and the PCCs will get pt scheduled.

## 2023-01-06 NOTE — Telephone Encounter (Signed)
Called and spoke with the pt  She is c/o chest tightness, minimal wheezing, and cough with yellow sputum x 2 days  Denies fevers, aches  She states this is how she typically presents with bronchiectasis flare  She is using her stiolto, saline neb and flutter valve  She is asking for abx  No openings until end of this wk with any provider  Please advise, thanks!  No Known Allergies

## 2023-01-16 NOTE — Telephone Encounter (Signed)
I can do Friday 01/24/23 at [facility] anytime 7a-7p   PATIENT: Molly Ross: female MRN: [id number] DOB: 12-Dec-1941 ADDRESS: [place] [id number]    Please schedule the following:  Diagnosis: BRonchiectasis with cough and sputuj Procedure: Video bronchocoopy, flexible bronchoscopy with BAL  Envisia Classifer Transbronchial biopsy: NO Anesthesia: general Do you need Fluro? no Size of Scope: regular Pre-med nebulized lidocaine: no Priority: 01/24/23 at [facility] friday  Does patient have OSA? no DM? no Or Latex allergy? no Medication Restriction: npo in AM except meds Anticoagulate/Antiplatelet: none Pre-op Labs Ordered: NOT Needed Imaging request: x       MISCELLANEOUS KEY INSTRUCTIONS    Please coordinate Pre-op COVID Testing   Please let Dr Chase Caller know via reply phone message on Epic  Thank you     Key patient medical info     Allergy History: No Known Allergies   Current Outpatient Medications:    cephALEXin (KEFLEX) 500 MG capsule, Take 1 capsule (500 mg total) by mouth 3 (three) times daily., Disp: 21 capsule, Rfl: 0   predniSONE (DELTASONE) 10 MG tablet, Take 4 tabs by mouth for 3 days, then 3 for 3 days, 2 for 3 days, 1 for 3 days and stop, Disp: 30 tablet, Rfl: 0   alendronate (FOSAMAX) 70 MG tablet, Take 1 tablet (70 mg total) by mouth every 7 (seven) days. Take with a full glass of water on an empty stomach., Disp: 12 tablet, Rfl: 3   Ascorbic Acid (VITAMIN C) 100 MG tablet, Take 1 tablet (100 mg total) by mouth daily., Disp: , Rfl:    aspirin 81 MG EC tablet, Take 1 tablet (81 mg total) by mouth daily. Swallow whole., Disp: 1 tablet, Rfl: 0   atorvastatin (LIPITOR) 20 MG tablet, TAKE 1 TABLET BY MOUTH EVERY DAY, Disp: 90 tablet, Rfl: 0   Calcium-Magnesium-Vitamin D (CALCIUM 500 PO), Take 1 tablet by mouth daily., Disp: , Rfl:    cholecalciferol (VITAMIN D3) 25 MCG (1000 UNIT) tablet, Take 1,000 Units by  mouth daily., Disp: , Rfl:    letrozole (FEMARA) 2.5 MG tablet, TAKE 1 TABLET BY MOUTH EVERY DAY, Disp: 30 tablet, Rfl: 0   Multiple Vitamins-Minerals (MULTIVITAMIN WITH MINERALS) tablet, Take 1 tablet by mouth daily., Disp: , Rfl:    nitroGLYCERIN (NITROSTAT) 0.4 MG SL tablet, Place 1 tablet (0.4 mg total) under the tongue every 5 (five) minutes as needed. (Patient not taking: Reported on 12/05/2021), Disp: 25 tablet, Rfl: 6   omeprazole (PRILOSEC OTC) 20 MG tablet, Take 20 mg by mouth daily., Disp: , Rfl:    sodium chloride HYPERTONIC 3 % nebulizer solution, Take by nebulization as needed for other., Disp: 750 mL, Rfl: 12   Tiotropium Bromide-Olodaterol (STIOLTO RESPIMAT) 2.5-2.5 MCG/ACT AERS, Inhale 2 puffs into the lungs daily., Disp: 4 g, Rfl: 5   traMADol (ULTRAM) 50 MG tablet, Take 1 tablet (50 mg total) by mouth 3 (three) times daily as needed., Disp: 42 tablet, Rfl: 1   has a past medical history of Atherosclerosis of aorta ([place]) (02/02/2015), Bronchiectasis ([place]), Bronchiectasis with (acute) exacerbation ([place]) (06/09/2015), CAD (coronary artery disease), native coronary artery, Cancer ([place]), Cellulitis of left breast (10/01/2018), Colon polyp, COPD, moderate ([place]) (11/06/2015), Coronary artery calcification seen on CT scan, Depressive disorder, not elsewhere classified (12/04/2011), Dyspnea, Emphysema of lung ([place]) (02/02/2015), Family history of lung cancer, Family history of ovarian cancer, Family history of prostate cancer, Genetic testing (03/27/2018), GERD (gastroesophageal reflux disease), Hiatal  hernia (02/02/2015), History of radiation therapy (04/16/18- 05/14/18), History of smoking 25-50 pack years (06/09/2015), Malignant neoplasm of lower-inner quadrant of left breast in female, estrogen receptor positive ([place]) (03/06/2018), Osteopenia (02/18/2018), Personal history of radiation therapy (2019), Pleuritic pain (10/01/2018), Shingles (1/09, 07/2010, 11/2017), Unspecified vitamin D deficiency (02/2011  and 2016), and Vitamin D deficiency (02/14/2011).    has a past surgical history that includes Shoulder surgery (03/2011); Tonsillectomy (child); Video bronchoscopy (Bilateral, 06/21/2016); Cataract extraction, bilateral (Bilateral, 03/2017, 04/2017); Breast lumpectomy with radioactive seed and sentinel lymph node biopsy (Left, 03/19/2018); Breast lumpectomy (Left, 2019); and Breast excisional biopsy (Right).   SIGNATURE    Dr. Brand Males, M.D., F.C.C.P,  Pulmonary and Critical Care Medicine Staff Physician, [place] Director - Interstitial Lung Disease  Program  P[facility] [place] at [place], [place], [address]  Pager: [phone], If no answer or between  15:00h - 7:00h: call [phone] Telephone: [phone]  4:39 PM 01/16/2023

## 2023-01-17 ENCOUNTER — Other Ambulatory Visit: Payer: Self-pay | Admitting: *Deleted

## 2023-01-17 DIAGNOSIS — Z01818 Encounter for other preprocedural examination: Secondary | ICD-10-CM

## 2023-01-17 NOTE — Telephone Encounter (Signed)
I spoke to pt to give her appt info and she states she can not do next Friday 2/9.  She has an appt that day and will be going out of town.  Any other date is fine.  When would you like me to reschedule?

## 2023-01-17 NOTE — Telephone Encounter (Signed)
Called [facility] and spoke to Santiago Glad who is working there today.  Can't do 2/9 at [facility] but maybe can do [facility] in the afternoon after 1:00 case already scheduled for there.  She asked me to call her back in an hour.  Sending secure chat to MR to see if that will work.

## 2023-01-17 NOTE — Telephone Encounter (Signed)
Spoke to Santiago Glad - she was able to add pt a [facility] on 2/9 at 8:30 since just a bronch.  I have called pt and left her a vm to call me back for appt info.

## 2023-01-19 DIAGNOSIS — M810 Age-related osteoporosis without current pathological fracture: Secondary | ICD-10-CM | POA: Insufficient documentation

## 2023-01-19 NOTE — Patient Instructions (Incomplete)
HEALTH MAINTENANCE RECOMMENDATIONS:  It is recommended that you get at least 30 minutes of aerobic exercise at least 5 days/week (for weight loss, you may need as much as 60-90 minutes). This can be any activity that gets your heart rate up. This can be divided in 10-15 minute intervals if needed, but try and build up your endurance at least once a week.  Weight bearing exercise is also recommended twice weekly.  Eat a healthy diet with lots of vegetables, fruits and fiber.  "Colorful" foods have a lot of vitamins (ie green vegetables, tomatoes, red peppers, etc).  Limit sweet tea, regular sodas and alcoholic beverages, all of which has a lot of calories and sugar.  Up to 1 alcoholic drink daily may be beneficial for women (unless trying to lose weight, watch sugars).  Drink a lot of water.  Calcium recommendations are 1200-1500 mg daily (1500 mg for postmenopausal women or women without ovaries), and vitamin D 1000 IU daily.  This should be obtained from diet and/or supplements (vitamins), and calcium should not be taken all at once, but in divided doses.  Monthly self breast exams and yearly mammograms for women over the age of 64 is recommended.  Sunscreen of at least SPF 30 should be used on all sun-exposed parts of the skin when outside between the hours of 10 am and 4 pm (not just when at beach or pool, but even with exercise, golf, tennis, and yard work!)  Use a sunscreen that says "broad spectrum" so it covers both UVA and UVB rays, and make sure to reapply every 1-2 hours.  Remember to change the batteries in your smoke detectors when changing your clock times in the spring and fall. Carbon monoxide detectors are recommended for your home.  Use your seat belt every time you are in a car, and please drive safely and not be distracted with cell phones and texting while driving.   Molly Ross , Thank you for taking time to come for your Medicare Wellness Visit. I appreciate your ongoing  commitment to your health goals. Please review the following plan we discussed and let me know if I can assist you in the future.   This is a list of the screening recommended for you and due dates:  Health Maintenance  Topic Date Due   COVID-19 Vaccine (3 - Moderna risk series) 02/23/2020   Medicare Annual Wellness Visit  04/06/2020   Colon Cancer Screening  11/11/2020   Zoster (Shingles) Vaccine (2 of 2) 05/20/2021   DTaP/Tdap/Td vaccine (2 - Td or Tdap) 09/04/2027   Pneumonia Vaccine  Completed   Flu Shot  Completed   DEXA scan (bone density measurement)  Completed   HPV Vaccine  Aged Out   Please get your 2nd shingles vaccine (Shingrix) from the pharmacy. RSV vaccine is also recommended.  You also get this at the pharmacy.  I highly encourage you to get this NOW (prior to your shingles vaccine), as we are in the midst of RSV season.  If you decide against getting this now, you should get it in October to protect you from next year's RSV season (and the shot likely works for 2 winters, they are still looking at that).  I recommend getting Prevnar-20 . There is no rush to this--I want you to get the RSV vaccine right away.  You can get the Prevnar-20 2 weeks after you get the 2nd Shingles vaccine, or at your next visit either with my office (nurse  visit or office visit), or at your lung doctor appointment.  COVID booster is recommended. Continue your yearly flu shots in the Fall.  You are past due to see Molly Ross. You are past due to see Molly Ross. Please schedule these appointments.  It is very important that you take the alendronate once weekly.  Your bones have progressively gotten weaker over the last 2 years, due to missed doses. Remember there are alternative--taking actonel or boniva once a month, or a Reclast infusion once yearly. We will recheck the bone density test in 2 years. It is important that you get adequate calcium and vitamin D through diet and supplements, as  well as get weight-bearing exercise, as we discussed.  Since you report some shortness of breath when you walk, I'm surprised that you don't have an albuterol inhaler.  This can be used as a rescue medication when you feel short of breath.  Or, for you, it would make more sense if you used it 15-20 minutes prior to exercise, to avoid the shortness of breath that might develop with activity.  Talk to your lung doctors to see if they think this is something you should have.  You need to contact them to schedule your bronchoscopy.  You are past due for your colonoscopy.  You had many polyps in 2018, and a 3 year follow-up was recommended.  I believe I had asked you to touch base with your [facility] doctor about this, and likely to also need to discuss with your lung doctors if this is okay.  Whether you need another colonoscopy should be discussed with Molly Ross [facility]).

## 2023-01-19 NOTE — Progress Notes (Unsigned)
No chief complaint on file.  Molly Ross is a 82 y.o. female who presents for annual physical exam, Medicare wellness visit and follow-up on chronic medical conditions.   She was last seen in September with some varicosities on lower extremities.  Compression stockings/socks were recommended.  Osteoporosis:  She was started on Alendronate in 02/2020 for osteopenia with elevated FRAX score (4.6% for hip). She tolerated this without any side effects, but stopped taking it when the prescription ran out (after 1 year). She was restarted on this 11/2021. 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 She reports taking alendronate appropriately (empty stomach, separate from food and other medications. Calcium intake Vitamin D was last check in 2019, and was normal at 52.2. She has a history of vitamin D deficiency (requiring rx replacement in 2016).   She is currently taking multivitamin daily, Calcium + D, and vitamin D 1000 IU daily.***VERIFY She has had repeated courses of prednisone related to flares of bronchiectasis. .  Left breast cancer 02/2018, s/p lumpectomy and radiation. She is on letrozole and doing well, denies side side effects. Plan is letrozole 2.5 mg for 5-7 years (started 05/15/2019). She had developed lymphedema of the breast, had physical therapy.  She hasn't had any further problems. She denies any breast complaints today.  She missed her appt with Dr. Lindi Adie in 09/2022, and had cancelled her appointments in July and May.  She hasn't seen Dr. Lindi Adie since 03/2021. She had a normal mammogram in 07/2022.  CAD: She was noted to have coronary calcification in 2 vessels on CT scan in 2016, saw Dr. Wynonia Lawman and had a myocardial perfusion scan. She had no evidence of ischemia with an ejection fraction of 76%. She has been compliant with taking statin, and denies side effects (takes '20mg'$  atorvastatin, had some  issues with discomfort in leg on the initial '40mg'$  dose). She follows a lowfat, low cholesterol diet.  Using any NTG? *** She continues to have DOE, unchanged, related to her underlying lung disease. She denies any recent changes in her symptoms.   Last year she had stopped her '81mg'$  aspirin. She was advised to restart it.   She last saw Dr. Bettina Gavia in 07/2021, was to follow-up in 1 year.  ENSURE TAKING ASA  Lipids were at goal on last check: Lab Results  Component Value Date   CHOL 147 05/28/2022   HDL 80 05/28/2022   LDLCALC 57 05/28/2022   TRIG 40 05/28/2022   CHOLHDL 1.8 05/28/2022   Aortic atherosclerosis:  noted on CT of chest in 12/2018.  She is compliant with her statin.  Emphysema and bronchiectasis: Under the care of pulmonologist. She is doing well on Stiolto. She had a flare in October, and a flare 01/06/23, treated with prednisone and Keflex. She is due to have bronchoscopy (trying to get it scheduled).  She doesn't have much sputum production (what she does get up is greenish-yellow), mainly after laying down. She doesn't require oxygen.  She continues to use flutter valve, but admits that she only uses it once or twice a week (recommended 2-4x/d).  No longer using hypertonic saline nebs.  Hasn't used any albuterol in a couple of years. UPDATE  Hiatal hernia was noted on CT done by pulm. It was small, but felt it might exacerbate her pulmonary symptoms, so they started her on Prilosec '20mg'$ .  She is doing well on this. She denies  heartburn, belching, or dysphagia.    Immunization History  Administered Date(s) Administered   Fluad Quad(high Dose 65+) 08/25/2019, 09/07/2020, 10/26/2021, 08/26/2022   Influenza, High Dose Seasonal PF 01/26/2014, 10/19/2014, 08/30/2015, 08/08/2016, 08/05/2017, 09/02/2018   Moderna Sars-Covid-2 Vaccination 12/29/2019, 01/26/2020   Pneumococcal Conjugate-13 12/25/2015   Pneumococcal Polysaccharide-23 12/30/2016   Tdap 09/03/2017   Zoster  Recombinat (Shingrix) 03/25/2021   Had COVID 04/2021, and declined further boosters Last Pap smear: 12/2015, normal, no high risk HPV detected Last mammogram: 07/2022 Last colonoscopy:  10/2017; 7 tubular adenomas found, and diverticulosis. Repeat was due 10/2020 (3 yr f/u). Last DEXA: 07/2022 T-2.5 R fem neck (prior--02/2020 T-2.1 R fem neck, FRAX elevated at 4.6% hip) Dentist: twice yearly Ophtho: yearly Exercise:  Walks 3x/week about 15 minutes (up and down the driveway), on the days she wasn't working, Does some weight-bearing exercise for upper body (body weight--pushing self up on chair).  Patient Care Team: Rita Ohara, MD as PCP - General (Family Medicine) Stark Klein, MD as Consulting Physician (General Surgery) Nicholas Lose, MD as Consulting Physician (Hematology and Oncology) Eppie Gibson, MD as Attending Physician (Radiation Oncology) Delice Bison Charlestine Massed, NP as Nurse Practitioner (Hematology and Oncology) Brand Males, MD as Consulting Physician (Pulmonary Disease) Richardo Priest, MD as Consulting Physician (Cardiology) Otis Brace, MD as Consulting Physician (Gastroenterology) Delsa Sale, OD (Optometry) Alanda Slim, Neena Rhymes, MD as Consulting Physician (Ophthalmology) Ulis Rias, DMD (Dentistry)  Depression Screening: [place] Office Visit from 12/05/2021 in [place]  PHQ-2 Total Score 0        Falls screen:     08/26/2022    3:12 PM 12/05/2021    2:07 PM 09/07/2020    9:38 AM 04/07/2019    8:35 AM 06/16/2018    2:10 PM  [place] in the past year? 1 0 0 0 No  Number falls in past yr: 0 0     Comment 02/12/22-fell at home in laundry room      Injury with Fall? 1 0     Comment 2 broken ribs      Risk for fall due to : History of fall(s) No Fall Risks     Follow up Falls evaluation completed Falls evaluation completed        Functional Status Survey:          End of Life Discussion:  Patient has a  living will and medical power of attorney (scanned 03/2018).  PMH, PSH, SH and FH were reviewed and updated.    ROS: The patient denies anorexia, fever, headaches, vision changes, decreased hearing, ear pain, sore throat, breast concerns, chest pain, palpitations, dizziness, syncope, dyspnea on exertion, cough, swelling, nausea, vomiting, diarrhea, constipation, abdominal pain, melena, hematochezia, indigestion/heartburn, hematuria, dysuria, vaginal bleeding, discharge, odor or itch, genital lesions, joint pains, numbness, tingling, weakness, tremor, suspicious skin lesions, depression, abnormal bleeding/bruising, or enlarged lymph nodes. Breathing is at baseline/improved.   Cough/breathing  Some leakage of urine with cough/sneeze    PHYSICAL EXAM:  There were no vitals taken for this visit.  Wt Readings from Last 3 Encounters:  10/08/22 147 lb 6.4 oz (66.9 kg)  08/26/22 141 lb 12.8 oz (64.3 kg)  07/05/22 142 lb (64.4 kg)    General Appearance:    Alert, cooperative, no distress, appears stated age. Frequent clearing of throat and coughing   Head:    Normocephalic, without obvious abnormality, atraumatic  Eyes:    PERRL, conjunctiva/corneas clear, EOM's intact, fundi benign  Ears:    Normal TM's and external ear canals  Nose:   No drainage or sinus tenderness  Throat:   Normal mucosa  Neck:   Supple, no lymphadenopathy;  thyroid:  no enlargement/ tenderness/ nodules; no carotid bruit or JVD  Back:    Spine nontender, no curvature, ROM normal, no CVA tenderness  Lungs:     Good air movement. No wheezes, or ronchi. There were crackles and slight decreased air movement on the right.  Respirations unlabored.   Chest Wall:    No tenderness or deformity   Heart:    Regular rate and rhythm, S1 and S2 normal, no murmur, rub or gallop.   Breast Exam:    No tenderness, masses, or nipple discharge or inversion on the right. WHSS in R upper breast.  Left breast has WHSS, below areola. There is  very slight color discoloration of the left breast (slightly yellow, compared to the right).  There is some tenderness at L UOQ, and also posteriorly to the L breast at 4 o'clock position.  These areas of tenderness are the same as present on exam last year. No axillary lymphadenopathy.  No masses.  Abdomen:     Soft, non-tender, nondistended, normoactive bowel sounds, no masses, no hepatosplenomegaly  Genitalia:    Normal external genitalia without lesions, some atrophy noted.  BUS and vagina normal; no cervical motion tenderness. No abnormal vaginal discharge.  Uterus and adnexa not enlarged, nontender, no masses.  Pap not performed  Rectal:    Normal sphincter tone, no masses.  Heme negative stool  Extremities:   No clubbing, cyanosis. Trace edema at ankles only  Pulses:   2+ and symmetric all extremities  Skin:   Skin color, texture, turgor normal, no lesions. She has many seborrheic keratoses throughout her body many of which are large. There are also many scattered angiomas. Left mid-shin --hyperkeratotic lesion, 4x17m, and slightly gray. (unchanged in size/appearance from last year).   Lymph nodes:   Cervical, supraclavicular, inguinal and axillary nodes normal  Neurologic:   Normal strength, sensation and gait; reflexes 2+ and symmetric throughout                                  Psych:   Normal mood, affect, hygiene and grooming  ***UPDATE If coughing/throat-clearing Update lungs Breast--?still tender on L? Edema at ankles? Varicosities? Update skin--L mid-shin   ASSESSMENT/PLAN:   Decline COVID booster (offer, but think she will refuse). Did she ever get 2nd shingrix?? (I reminded her at 11/2021 physical). RSV recommended Consider [id number] Is she still taking letrozole??  Past due to see onc, looks like only rx'd #30 in August? Is she taking alendronate? (Year rx written 11/2021)  RF lipitor, fosamax  Past due to see oncology (missed October appt, cancelled 2 prior  appts). Past due to f/u with Dr. MBettina Gavia(cardiology) Did she ever speak with [facility] re: colonoscopy? (Which was due in 2021)--alternative screening, or none based on age/lungs?  Osteoporosis--discuss Ca, wt-bearing exercise  ***add bronchiectasis dx  Glu, lipids, cbc, vit D  Reviewed cbc, b-met and LFT's done in October.  CBC abnormal (on prednisone), due for recheck Lab Results  Component Value Date   WBC 16.7 (H) 10/08/2022   HGB 11.4 (L) 10/08/2022   HCT 34.9 (L) 10/08/2022   MCV 89.5 10/08/2022   PLT 293.0 10/08/2022    Discussed monthly self breast exams and yearly  mammograms; at least 30 minutes of aerobic activity at least 5 days/week and weight-bearing exercise 2x/week; proper sunscreen use reviewed; healthy diet, including goals of calcium and vitamin D intake and alcohol recommendations (less than or equal to 1 drink/day) reviewed; regular seatbelt use; changing batteries in smoke detectors.  Immunization recommendations discussed--continue yearly Flu shots. COVID bivalent booster strongly encouraged, declined. 2nd Shingrix RSV Prevnar-20 Colonoscopy UTD, repeat due 10/2020.    F/u 1 year (AWV/CPE), sooner prn.  Full Code, Full care   Medicare Attestation I have personally reviewed: The patient's medical and social history Their use of alcohol, tobacco or illicit drugs Their current medications and supplements The patient's functional ability including ADLs,fall risks, home safety risks, cognitive, and hearing and visual impairment Diet and physical activities Evidence for depression or mood disorders  The patient's weight, height, BMI have been recorded in the chart.  I have made referrals, counseling, and provided education to the patient based on review of the above and I have provided the patient with a written personalized care plan for preventive services.

## 2023-01-20 ENCOUNTER — Telehealth: Payer: Self-pay | Admitting: Internal Medicine

## 2023-01-20 ENCOUNTER — Encounter: Payer: Self-pay | Admitting: Family Medicine

## 2023-01-20 ENCOUNTER — Ambulatory Visit (INDEPENDENT_AMBULATORY_CARE_PROVIDER_SITE_OTHER): Payer: Medicare Other | Admitting: Family Medicine

## 2023-01-20 VITALS — BP 122/64 | HR 80 | Ht 69.0 in | Wt 146.4 lb

## 2023-01-20 DIAGNOSIS — Z5181 Encounter for therapeutic drug level monitoring: Secondary | ICD-10-CM

## 2023-01-20 DIAGNOSIS — I700 Atherosclerosis of aorta: Secondary | ICD-10-CM

## 2023-01-20 DIAGNOSIS — E559 Vitamin D deficiency, unspecified: Secondary | ICD-10-CM

## 2023-01-20 DIAGNOSIS — C773 Secondary and unspecified malignant neoplasm of axilla and upper limb lymph nodes: Secondary | ICD-10-CM | POA: Diagnosis not present

## 2023-01-20 DIAGNOSIS — Z170 Estrogen receptor positive status [ER+]: Secondary | ICD-10-CM | POA: Diagnosis not present

## 2023-01-20 DIAGNOSIS — C50312 Malignant neoplasm of lower-inner quadrant of left female breast: Secondary | ICD-10-CM | POA: Diagnosis not present

## 2023-01-20 DIAGNOSIS — Z0000 Encounter for general adult medical examination without abnormal findings: Secondary | ICD-10-CM

## 2023-01-20 DIAGNOSIS — Z131 Encounter for screening for diabetes mellitus: Secondary | ICD-10-CM

## 2023-01-20 DIAGNOSIS — J471 Bronchiectasis with (acute) exacerbation: Secondary | ICD-10-CM

## 2023-01-20 DIAGNOSIS — D692 Other nonthrombocytopenic purpura: Secondary | ICD-10-CM

## 2023-01-20 DIAGNOSIS — M810 Age-related osteoporosis without current pathological fracture: Secondary | ICD-10-CM

## 2023-01-20 DIAGNOSIS — Z79899 Other long term (current) drug therapy: Secondary | ICD-10-CM | POA: Diagnosis not present

## 2023-01-20 DIAGNOSIS — J449 Chronic obstructive pulmonary disease, unspecified: Secondary | ICD-10-CM | POA: Diagnosis not present

## 2023-01-20 DIAGNOSIS — I25118 Atherosclerotic heart disease of native coronary artery with other forms of angina pectoris: Secondary | ICD-10-CM

## 2023-01-20 LAB — POCT URINALYSIS DIP (PROADVANTAGE DEVICE)
Bilirubin, UA: NEGATIVE
Blood, UA: NEGATIVE
Glucose, UA: NEGATIVE mg/dL
Ketones, POC UA: NEGATIVE mg/dL
Leukocytes, UA: NEGATIVE
Nitrite, UA: NEGATIVE
Protein Ur, POC: NEGATIVE mg/dL
Specific Gravity, Urine: 1.005
Urobilinogen, Ur: 0.2
pH, UA: 7 (ref 5.0–8.0)

## 2023-01-20 LAB — LIPID PANEL

## 2023-01-20 MED ORDER — ATORVASTATIN CALCIUM 20 MG PO TABS: 20.0000 mg | ORAL_TABLET | Freq: Every day | ORAL | 1 refills | Status: DC

## 2023-01-20 MED ORDER — ALENDRONATE SODIUM 70 MG PO TABS: 70.0000 mg | ORAL_TABLET | ORAL | 3 refills | Status: DC

## 2023-01-20 NOTE — Telephone Encounter (Signed)
Called and spoke with pt who states that she finished the cephalexin abx and pred taper about 3 days ago. States she is not fully over this recent bronchiectasis flare and she is requesting to have a refill of both meds to see if she can get this knocked out.  Pt said she is still having a cough getting up a lot of green phlegm and she also has tightness in her chest. Pt denies any complaints of fever.  With MR working nights, sending to provider of the day for review. Katie, please advise.

## 2023-01-20 NOTE — Telephone Encounter (Signed)
Called and spoke with pt about the info from [place]. Pt said she didn't notice much improvement after meds. Appt scheduled for pt with Dr. Silas Flood for an acute visit. Nothing further needed.

## 2023-01-20 NOTE — Telephone Encounter (Signed)
Did she have any improvement with the abx and steroids previously? She was on a 7 day course of the abx and 12 days of the steroid so if she did not have any improvement with that, she really should be evaluated in person. She also needs to come provide sputum samples. I don't see where these were collected. AFB and sputum culture. Thanks.

## 2023-01-21 DIAGNOSIS — C773 Secondary and unspecified malignant neoplasm of axilla and upper limb lymph nodes: Secondary | ICD-10-CM | POA: Insufficient documentation

## 2023-01-21 LAB — CBC WITH DIFFERENTIAL/PLATELET
Basophils Absolute: 0.1 10*3/uL (ref 0.0–0.2)
Basos: 1 %
EOS (ABSOLUTE): 0.1 10*3/uL (ref 0.0–0.4)
Eos: 1 %
Hematocrit: 38.3 % (ref 34.0–46.6)
Hemoglobin: 13 g/dL (ref 11.1–15.9)
Immature Grans (Abs): 0 10*3/uL (ref 0.0–0.1)
Immature Granulocytes: 0 %
Lymphocytes Absolute: 1.7 10*3/uL (ref 0.7–3.1)
Lymphs: 15 %
MCH: 29.1 pg (ref 26.6–33.0)
MCHC: 33.9 g/dL (ref 31.5–35.7)
MCV: 86 fL (ref 79–97)
Monocytes Absolute: 0.8 10*3/uL (ref 0.1–0.9)
Monocytes: 7 %
Neutrophils Absolute: 8.7 10*3/uL — ABNORMAL HIGH (ref 1.4–7.0)
Neutrophils: 76 %
Platelets: 376 10*3/uL (ref 150–450)
RBC: 4.46 x10E6/uL (ref 3.77–5.28)
RDW: 11.9 % (ref 11.7–15.4)
WBC: 11.4 10*3/uL — ABNORMAL HIGH (ref 3.4–10.8)

## 2023-01-21 LAB — GLUCOSE, RANDOM: Glucose: 82 mg/dL (ref 70–99)

## 2023-01-21 LAB — VITAMIN D 25 HYDROXY (VIT D DEFICIENCY, FRACTURES): Vit D, 25-Hydroxy: 31.2 ng/mL (ref 30.0–100.0)

## 2023-01-21 LAB — TSH: TSH: 1.03 u[IU]/mL (ref 0.450–4.500)

## 2023-01-21 LAB — LIPID PANEL
Chol/HDL Ratio: 2 ratio (ref 0.0–4.4)
Cholesterol, Total: 158 mg/dL (ref 100–199)
HDL: 81 mg/dL (ref >39)
LDL Chol Calc (NIH): 66 mg/dL (ref 0–99)
Triglycerides: 51 mg/dL (ref 0–149)
VLDL Cholesterol Cal: 11 mg/dL (ref 5–40)

## 2023-01-22 ENCOUNTER — Ambulatory Visit: Payer: Medicare Other | Admitting: Pulmonary Disease

## 2023-01-24 ENCOUNTER — Encounter (HOSPITAL_COMMUNITY): Payer: Self-pay

## 2023-01-24 ENCOUNTER — Ambulatory Visit (HOSPITAL_COMMUNITY): Admit: 2023-01-24 | Payer: Medicare Other | Admitting: Internal Medicine

## 2023-01-24 SURGERY — VIDEO BRONCHOSCOPY WITHOUT FLUORO
Anesthesia: General

## 2023-01-29 NOTE — Telephone Encounter (Signed)
Pt has been scheduled for 3/8 at 7:30 at [facility] by Santiago Glad.  Pt will need to get covid test in the office on 3/5.  Called pt to give her appt info and had to leave a vm for her to call me back.

## 2023-01-29 NOTE — Telephone Encounter (Signed)
I can do the bronchoscopy with lavage on February 10, 2023 Monday sometime in the morning either at [facility] or [place].  In the next available option is February 21, 2023 Friday morning or afternoon at [facility] when I am the rounder in the neurounit

## 2023-01-30 ENCOUNTER — Encounter: Payer: Self-pay | Admitting: Internal Medicine

## 2023-01-30 NOTE — Telephone Encounter (Signed)
Spoke to pt and gave her appt info and send her bronch letter thru [place].

## 2023-02-18 ENCOUNTER — Other Ambulatory Visit: Payer: Medicare Other

## 2023-02-18 DIAGNOSIS — Z01818 Encounter for other preprocedural examination: Secondary | ICD-10-CM

## 2023-02-20 ENCOUNTER — Other Ambulatory Visit: Payer: Self-pay

## 2023-02-20 ENCOUNTER — Encounter (HOSPITAL_COMMUNITY): Payer: Self-pay | Admitting: Internal Medicine

## 2023-02-20 LAB — SPECIMEN STATUS REPORT

## 2023-02-20 LAB — NOVEL CORONAVIRUS, NAA: SARS-CoV-2, NAA: NOT DETECTED

## 2023-02-20 NOTE — Anesthesia Preprocedure Evaluation (Addendum)
Anesthesia Evaluation  Patient identified by MRN, date of birth, ID band Patient awake    Reviewed: Allergy & Precautions, NPO status , Patient's Chart, lab work & pertinent test results  Airway Mallampati: I  TM Distance: >3 FB Neck ROM: Full    Dental  (+) Teeth Intact, Dental Advisory Given   Pulmonary COPD, former smoker    + decreased breath sounds      Cardiovascular + CAD and + Peripheral Vascular Disease  + Valvular Problems/Murmurs  Rhythm:Regular Rate:Normal     Neuro/Psych  PSYCHIATRIC DISORDERS  Depression     Neuromuscular disease    GI/Hepatic Neg liver ROS, hiatal hernia,GERD  ,,  Endo/Other  negative endocrine ROS    Renal/GU negative Renal ROS     Musculoskeletal  (+) Arthritis ,    Abdominal   Peds  Hematology negative hematology ROS (+)   Anesthesia Other Findings   Reproductive/Obstetrics                             Anesthesia Physical Anesthesia Plan  ASA: 3  Anesthesia Plan: General   Post-op Pain Management: Minimal or no pain anticipated   Induction: Intravenous  PONV Risk Score and Plan: 3 and Ondansetron and TIVA  Airway Management Planned: Oral ETT  Additional Equipment: None  Intra-op Plan:   Post-operative Plan: Extubation in OR  Informed Consent: I have reviewed the patients History and Physical, chart, labs and discussed the procedure including the risks, benefits and alternatives for the proposed anesthesia with the patient or authorized representative who has indicated his/her understanding and acceptance.     Dental advisory given  Plan Discussed with: CRNA  Anesthesia Plan Comments:        Anesthesia Quick Evaluation

## 2023-02-20 NOTE — Progress Notes (Signed)
Spoke with pt for pre-op call. Pt denies cardiac history, HTN or Diabetes. Pt does have COPD and has chronic cough and shortness of breath.   Shower instructions given to pt.

## 2023-02-21 ENCOUNTER — Encounter (HOSPITAL_COMMUNITY): Payer: Self-pay | Admitting: Internal Medicine

## 2023-02-21 ENCOUNTER — Ambulatory Visit (HOSPITAL_COMMUNITY)
Admission: RE | Admit: 2023-02-21 | Discharge: 2023-02-21 | Disposition: A | Discharge status: Home / Self Care | Payer: Medicare Other | Attending: Internal Medicine | Admitting: Internal Medicine

## 2023-02-21 ENCOUNTER — Encounter (HOSPITAL_COMMUNITY)
Admission: RE | Disposition: A | Discharge status: Home / Self Care | Payer: Self-pay | Source: Home / Self Care | Attending: Internal Medicine

## 2023-02-21 ENCOUNTER — Ambulatory Visit (HOSPITAL_BASED_OUTPATIENT_CLINIC_OR_DEPARTMENT_OTHER): Payer: Medicare Other | Admitting: Anesthesiology

## 2023-02-21 ENCOUNTER — Ambulatory Visit (HOSPITAL_COMMUNITY): Payer: Medicare Other | Admitting: Anesthesiology

## 2023-02-21 DIAGNOSIS — Z09 Encounter for follow-up examination after completed treatment for conditions other than malignant neoplasm: Secondary | ICD-10-CM | POA: Diagnosis not present

## 2023-02-21 DIAGNOSIS — K219 Gastro-esophageal reflux disease without esophagitis: Secondary | ICD-10-CM | POA: Insufficient documentation

## 2023-02-21 DIAGNOSIS — J471 Bronchiectasis with (acute) exacerbation: Secondary | ICD-10-CM | POA: Diagnosis not present

## 2023-02-21 DIAGNOSIS — F32A Depression, unspecified: Secondary | ICD-10-CM | POA: Diagnosis not present

## 2023-02-21 DIAGNOSIS — Z87891 Personal history of nicotine dependence: Secondary | ICD-10-CM | POA: Insufficient documentation

## 2023-02-21 DIAGNOSIS — I2510 Atherosclerotic heart disease of native coronary artery without angina pectoris: Secondary | ICD-10-CM | POA: Diagnosis not present

## 2023-02-21 DIAGNOSIS — M1990 Unspecified osteoarthritis, unspecified site: Secondary | ICD-10-CM | POA: Diagnosis not present

## 2023-02-21 DIAGNOSIS — J449 Chronic obstructive pulmonary disease, unspecified: Secondary | ICD-10-CM | POA: Insufficient documentation

## 2023-02-21 DIAGNOSIS — K449 Diaphragmatic hernia without obstruction or gangrene: Secondary | ICD-10-CM | POA: Insufficient documentation

## 2023-02-21 DIAGNOSIS — J479 Bronchiectasis, uncomplicated: Secondary | ICD-10-CM

## 2023-02-21 HISTORY — DX: Pneumonia, unspecified organism: J18.9

## 2023-02-21 HISTORY — PX: BRONCHIAL WASHINGS: SHX5105

## 2023-02-21 HISTORY — DX: Unspecified osteoarthritis, unspecified site: M19.90

## 2023-02-21 HISTORY — PX: VIDEO BRONCHOSCOPY: SHX5072

## 2023-02-21 HISTORY — DX: Cardiac murmur, unspecified: R01.1

## 2023-02-21 LAB — CBC
HCT: 37.1 % (ref 36.0–46.0)
Hemoglobin: 12.1 g/dL (ref 12.0–15.0)
MCH: 29.5 pg (ref 26.0–34.0)
MCHC: 32.6 g/dL (ref 30.0–36.0)
MCV: 90.5 fL (ref 80.0–100.0)
Platelets: 268 10*3/uL (ref 150–400)
RBC: 4.1 MIL/uL (ref 3.87–5.11)
RDW: 13.8 % (ref 11.5–15.5)
WBC: 8 10*3/uL (ref 4.0–10.5)
nRBC: 0 % (ref 0.0–0.2)

## 2023-02-21 LAB — BODY FLUID CELL COUNT WITH DIFFERENTIAL
Other Cells, Fluid: UNDETERMINED %
Total Nucleated Cell Count, Fluid: UNDETERMINED cu mm (ref 0–1000)

## 2023-02-21 LAB — BASIC METABOLIC PANEL
Anion gap: 7 (ref 5–15)
BUN: 16 mg/dL (ref 8–23)
CO2: 28 mmol/L (ref 22–32)
Calcium: 8.9 mg/dL (ref 8.9–10.3)
Chloride: 104 mmol/L (ref 98–111)
Creatinine, Ser: 0.57 mg/dL (ref 0.44–1.00)
GFR, Estimated: >60 mL/min (ref >60)
Glucose, Bld: 90 mg/dL (ref 70–99)
Potassium: 3.3 mmol/L — ABNORMAL LOW (ref 3.5–5.1)
Sodium: 139 mmol/L (ref 135–145)

## 2023-02-21 SURGERY — VIDEO BRONCHOSCOPY WITHOUT FLUORO
Anesthesia: General

## 2023-02-21 MED ORDER — CHLORHEXIDINE GLUCONATE 0.12 % MT SOLN
OROMUCOSAL | Status: AC
  Filled 2023-02-21: qty 15

## 2023-02-21 MED ORDER — PROPOFOL 500 MG/50ML IV EMUL
INTRAVENOUS | Status: DC | PRN
  Administered 2023-02-21: 150 ug/kg/min via INTRAVENOUS

## 2023-02-21 MED ORDER — ONDANSETRON HCL 4 MG/2ML IJ SOLN
INTRAMUSCULAR | Status: DC | PRN
  Administered 2023-02-21: 4 mg via INTRAVENOUS

## 2023-02-21 MED ORDER — SUGAMMADEX SODIUM 200 MG/2ML IV SOLN
INTRAVENOUS | Status: DC | PRN
  Administered 2023-02-21: 300 mg via INTRAVENOUS

## 2023-02-21 MED ORDER — LACTATED RINGERS IV SOLN: INTRAVENOUS | Status: DC

## 2023-02-21 MED ORDER — FENTANYL CITRATE (PF) 250 MCG/5ML IJ SOLN
INTRAMUSCULAR | Status: DC | PRN
  Administered 2023-02-21 (×2): 25 ug via INTRAVENOUS

## 2023-02-21 MED ORDER — LIDOCAINE 2% (20 MG/ML) 5 ML SYRINGE
INTRAMUSCULAR | Status: DC | PRN
  Administered 2023-02-21: 40 mg via INTRAVENOUS

## 2023-02-21 MED ORDER — ROCURONIUM BROMIDE 10 MG/ML (PF) SYRINGE
PREFILLED_SYRINGE | INTRAVENOUS | Status: DC | PRN
  Administered 2023-02-21: 40 mg via INTRAVENOUS

## 2023-02-21 MED ORDER — PROPOFOL 10 MG/ML IV BOLUS
INTRAVENOUS | Status: DC | PRN
  Administered 2023-02-21: 110 mg via INTRAVENOUS

## 2023-02-21 MED ORDER — DEXAMETHASONE SODIUM PHOSPHATE 10 MG/ML IJ SOLN
INTRAMUSCULAR | Status: DC | PRN
  Administered 2023-02-21: 10 mg via INTRAVENOUS

## 2023-02-21 NOTE — Op Note (Signed)
Name:  Molly Ross MRN:  [id number] DOB:  02/09/1941  PROCEDURE NOTE  Procedure(s): Flexible bronchoscopy [phone]) Bronchial alveolar lavage [phone]) of the Right lower lobe  Indications:  Bronchiectasis with frequent exacerbations  Consent:  Procedure, benefits, risks and alternatives discussed.  Questions answered.  Consent obtained.  Anesthesia:  General anesthesis  Location: [facility]  Procedure summary:  Appropriate equipment was assembled.  The patient was brought to the procedure suite room and identified as Molly Ross with 02-03-1941  Safety timeout was performed. The patient was placed supine on the  table, airway and GA given by Anesthesia team  After the appropriate level of  anesthsiea  was assured, flexible video bronchoscope was lubricated and inserted through the ET TUBe   Airway examination was YES performed bilaterally to subsegmental level.   Lot of thick purulent secretions  everywhere . WAs very difficult to suction. NEeded saline to clear. Was coming out of all subsegmental airways but mostly from lower lobe.  THese were aspirated to a LEukens trap and being sent for general cutlure  Bronchial alveolar lavage of the the was performed of the RLL with 65m X 2 mL of normal saline . Total return of 40 mL of fluid, of THICK PURULENT RETURNS OBTAINED after which the bronchoscope was withdrawn.   After hemostasis was assure, the bronchoscope was withdrawn.  The patient was recovered and then  transferred to recovery area  Post-procedure chest x-ray was NOT ordered.  Specimens sent: Bronchial alveolar lavage specimen of the RLL for cell count, microbiology and cytology.  Complications:  No immediate complications were noted.  Hemodynamic parameters and oxygenation remained stable throughout the procedure.  Estimated blood loss:  NONE  IMPRESSION 1. Bronchiectasis  2. Purulent secretions - significant - 3. No endobronchial  lesion  Followup  Likely needs aggressive pulmonary toilet  Future Appointments  Date Time Provider DDwight 03/07/2023 10:00 AM Parrett, TFonnie Mu NP [facility] None  01/29/2024  8:45 AM KRita Ohara MD [facility]     Dr. MBrand Males M.D., F.C.C.P Pulmonary and Critical Care Medicine Staff Physician [facility] Pager: [phone] If no answer or between  15:00h - 7:00h: call [phone]  02/21/2023 8:25 AM

## 2023-02-21 NOTE — Anesthesia Procedure Notes (Signed)
Procedure Name: Intubation Date/Time: 02/21/2023 8:03 AM  Performed by: Gaylene Brooks, CRNAPre-anesthesia Checklist: Patient identified, Emergency Drugs available, Suction available and Patient being monitored Patient Re-evaluated:Patient Re-evaluated prior to induction Oxygen Delivery Method: Circle System Utilized Preoxygenation: Pre-oxygenation with 100% oxygen Induction Type: IV induction Ventilation: Mask ventilation without difficulty Laryngoscope Size: Miller and 2 Grade View: Grade I Tube type: Oral Tube size: 8.5 mm Number of attempts: 1 Airway Equipment and Method: Stylet and Oral airway Placement Confirmation: ETT inserted through vocal cords under direct vision, positive ETCO2 and breath sounds checked- equal and bilateral Secured at: 22 cm Tube secured with: Tape Dental Injury: Teeth and Oropharynx as per pre-operative assessment

## 2023-02-21 NOTE — Transfer of Care (Signed)
Immediate Anesthesia Transfer of Care Note  Patient: Molly Ross  Procedure(s) Performed: VIDEO BRONCHOSCOPY WITHOUT FLUORO BRONCHIAL WASHINGS  Patient Location: PACU  Anesthesia Type:General  Level of Consciousness: awake, drowsy, and patient cooperative  Airway & Oxygen Therapy: Patient Spontanous Breathing and Patient connected to face mask oxygen  Post-op Assessment: Report given to RN, Post -op Vital signs reviewed and stable, and Patient moving all extremities X 4  Post vital signs: Reviewed and stable  Last Vitals:  Vitals Value Taken Time  BP    Temp    Pulse    Resp    SpO2      Last Pain:  Vitals:   02/21/23 0617  TempSrc: Oral  PainSc:          Complications: No notable events documented.

## 2023-02-21 NOTE — Discharge Instructions (Signed)
BRONCHOSCOPY DISCHARGE INSTRUCTIONS  - Please have someone to drive you home - Please be careful with activities for next 24 hours - You can eat 2-4 hours after getting home provided you are fully alert, able to cough, and are not nauseated or vomiting and     feel well - You are expected to have low grade fever or cough some amount of blood for next 24-48 hours; if this worsens call us - If you are very short of breath or coughing blood or chest pain or not feeling well, call us [phone] anytime or go to emergency room - For followup appointment - see below. If not there please call [phone] to make an appointment with Dr Chase Caller or nurse practitioner   Future Appointments  Date Time Provider Leonard  03/07/2023 10:00 AM Parrett, Fonnie Mu, NP [facility] None  01/29/2024  8:45 AM Rita Ohara, MD [facility]

## 2023-02-21 NOTE — Anesthesia Postprocedure Evaluation (Signed)
Anesthesia Post Note  Patient: Molly Ross  Procedure(s) Performed: VIDEO BRONCHOSCOPY WITHOUT FLUORO BRONCHIAL WASHINGS     Patient location during evaluation: PACU Anesthesia Type: General Level of consciousness: awake and alert Pain management: pain level controlled Vital Signs Assessment: post-procedure vital signs reviewed and stable Respiratory status: spontaneous breathing, nonlabored ventilation, respiratory function stable and patient connected to nasal cannula oxygen Cardiovascular status: blood pressure returned to baseline and stable Postop Assessment: no apparent nausea or vomiting Anesthetic complications: no  No notable events documented.  Last Vitals:  Vitals:   02/21/23 0845 02/21/23 0900  BP: (!) 154/68 (!) 157/72  Pulse: 88 87  Resp: 20 18  Temp:    SpO2: 92% 93%    Last Pain:  Vitals:   02/21/23 0900  TempSrc:   PainSc: 0-No pain                 Effie Berkshire

## 2023-02-21 NOTE — H&P (Signed)
OV 02/21/2023  Subjective:  Patient ID: Molly Ross, female , DOB: 1941/11/14 , age 82 y.o. , MRN: [id number] , ADDRESS: [place] [id number] PCP Rita Ohara, MD Patient Care Team: Rita Ohara, MD as PCP - General (Family Medicine) Stark Klein, MD as Consulting Physician (General Surgery) Nicholas Lose, MD as Consulting Physician (Hematology and Oncology) Eppie Gibson, MD as Attending Physician (Radiation Oncology) Delice Bison, Charlestine Massed, NP as Nurse Practitioner (Hematology and Oncology) Brand Males, MD as Consulting Physician (Pulmonary Disease) Richardo Priest, MD as Consulting Physician (Cardiology) Otis Brace, MD as Consulting Physician (Gastroenterology) Delsa Sale, [place] (Optometry) Alanda Slim, Neena Rhymes, MD as Consulting Physician (Ophthalmology) Ulis Rias, DMD (Dentistry)  This Provider for this visit: Treatment Team:  Attending Provider: Brand Males, MD    10/08/2022 -       Chief Complaint  Patient presents with   Acute Visit      PT states she has pain in left lung (started this AM)      follow bronchiectasis [idiopathic work-up February 2017 with negative serology and negative immunoglobulin] 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  Not taking 3% saline nebulizer   Normal stress Myoview in August 2016   Background history of breast cancer followed by Dr. Lindi Adie   02/21/2023 -  known bro   HPI Molly Ross 82 y.o. - known bronchiectasis . Having repeated exacerbations. Plan for repat bronch. H&P is  > 38 d old so new one being done. No new issues. NPO confirmed    CT Chest data  No results found.    PFT     Latest Ref Rng & Units 07/05/2022    9:42 AM 10/30/2015    4:38 PM  PFT Results  FVC-Pre L 2.03  2.13    FVC-Predicted Pre % 62  61   FVC-Post L 2.01  2.26   FVC-Predicted Post % 61  64   Pre FEV1/FVC % % 58  60   Post FEV1/FCV % % 58  60   FEV1-Pre L 1.17  1.27   FEV1-Predicted Pre % 47  48   FEV1-Post L 1.17  1.36   DLCO uncorrected ml/min/mmHg 12.66  16.04   DLCO UNC% % 57  51   DLCO corrected ml/min/mmHg 12.95    DLCO COR %Predicted % 58    DLVA Predicted % 110  83   TLC L  5.24   TLC % Predicted %  90   RV % Predicted %  118        has a past medical history of Arthritis, Atherosclerosis of aorta ([place]) (02/02/2015), Bronchiectasis ([place]), Bronchiectasis with (acute) exacerbation ([place]) (06/09/2015), CAD (coronary artery disease), native coronary artery, Cancer ([place]), Cellulitis of left breast (10/01/2018), Colon polyp, COPD, moderate ([place]) (11/06/2015), Coronary artery calcification seen on CT scan, COVID (2020), Depressive disorder, not elsewhere classified (12/04/2011), Dyspnea, Emphysema of lung ([place]) (02/02/2015), Family history of lung cancer, Family history of ovarian cancer, Family history of prostate cancer, Genetic testing (03/27/2018), GERD (gastroesophageal reflux disease), Heart murmur, Hiatal hernia (02/02/2015), History of radiation therapy (04/16/18- 05/14/18), History of smoking 25-50 pack years (06/09/2015), Malignant neoplasm of lower-inner quadrant of left breast in female, estrogen receptor positive ([place]) (03/06/2018), Osteopenia (02/18/2018), Personal history of radiation therapy (2019), Pleuritic pain (10/01/2018), Pneumonia, Shingles (  1/09, 07/2010, 11/2017), Unspecified vitamin D deficiency (02/2011 and 2016), and Vitamin D deficiency (02/14/2011).   reports that she quit smoking about 42 years ago. Her smoking use included cigarettes. She has a 48.00 pack-year smoking history. She has never used smokeless tobacco.  Past Surgical History:  Procedure Laterality Date   BREAST EXCISIONAL BIOPSY Right    benign years ago   BREAST LUMPECTOMY Left 2019   BREAST LUMPECTOMY  WITH RADIOACTIVE SEED AND SENTINEL LYMPH NODE BIOPSY Left 03/19/2018   Procedure: BREAST LUMPECTOMY WITH RADIOACTIVE SEED AND SENTINEL LYMPH NODE BIOPSY;  Surgeon: Stark Klein, MD;  Location: [facility];  Service: General;  Laterality: Left;   CATARACT EXTRACTION, BILATERAL Bilateral 03/2017, 04/2017   [facility], Dr. Alanda Slim   SHOULDER SURGERY  03/2011   for frozen shoulder, and cyst removal (Dr. Percell Miller)   TONSILLECTOMY  child   VIDEO BRONCHOSCOPY Bilateral 06/21/2016   Procedure: VIDEO BRONCHOSCOPY WITHOUT FLUORO;  Surgeon: Brand Males, MD;  Location: [facility];  Service: Cardiopulmonary;  Laterality: Bilateral;    No Known Allergies  Immunization History  Administered Date(s) Administered   Fluad Quad(high Dose 65+) 08/25/2019, 09/07/2020, 10/26/2021, 08/26/2022   Influenza, High Dose Seasonal PF 01/26/2014, 10/19/2014, 08/30/2015, 08/08/2016, 08/05/2017, 09/02/2018   Moderna Sars-Covid-2 Vaccination 12/29/2019, 01/26/2020   Pneumococcal Conjugate-13 12/25/2015   Pneumococcal Polysaccharide-23 12/30/2016   Tdap 09/03/2017   Zoster Recombinat (Shingrix) 03/25/2021    Family History  Problem Relation Age of Onset   Stroke Mother    Diabetes Mother    Lung cancer Father 63   Stroke Sister 27       brainstem stroke   Diabetes Sister    Seizures Sister    Ovarian cancer Sister 29       metastatic to lung, liver   Cancer Sister    Heart disease Maternal Grandmother    Heart disease Paternal Grandmother    Ovarian cancer Maternal Aunt 95   Prostate cancer Cousin    Lung cancer Cousin        recurrent, now stage 4   Breast cancer Neg Hx      Current Facility-Administered Medications:    lactated ringers infusion, , Intravenous, Continuous, Ellender, Karyl Kinnier, MD      Objective:   Vitals:   02/21/23 0617  BP: 122/64  Pulse: 72  Resp: 20  Temp: 98 F (36.7 C)  TempSrc: Oral  SpO2: 96%  Weight: 63.5 kg  Height: '5\' 9"'$  (1.753 m)    Estimated  body mass index is 20.67 kg/m as calculated from the following:   Height as of this encounter: '5\' 9"'$  (1.753 m).   Weight as of this encounter: 63.5 kg.  Weight change:   Filed Weights   02/21/23 0617  Weight: 63.5 kg     Physical Examlooks wellGeneral: No distress. no Neuro: Alert and Oriented x 3. GCS 15. Speech normal Psych: Pleasant Resp:  Barrel Chest - no.  Wheeze - no, Crackles - no, No overt respiratory distress CVS: Normal heart sounds. Murmurs - no Ext: Stigmata of Connective Tissue Disease - no HEENT: Normal upper airway. PEERL +. No post nasal drip        Assessment:     Bronchiectasis     Plan:       Risks of pneumothorax, hemothorax, sedation/anesthesia complications such as cardiac or respiratory arrest or hypotension, stroke and bleeding all explained. Benefits of diagnosis but limitations of non-diagnosis also explained. Patient verbalized understanding and wished to proceed.  SIGNATURE    Dr. Brand Males, M.D., F.C.C.P,  Pulmonary and Critical Care Medicine Staff Physician, [place] Director - Interstitial Lung Disease  Program  [facility] [place], [place], [address]  Pager: [phone], If no answer or between  15:00h - 7:00h: call [phone] Telephone: [phone]  7:52 AM 02/21/2023

## 2023-02-23 ENCOUNTER — Telehealth (INDEPENDENT_AMBULATORY_CARE_PROVIDER_SITE_OTHER): Payer: Medicare Other | Admitting: Student

## 2023-02-23 DIAGNOSIS — R0789 Other chest pain: Secondary | ICD-10-CM | POA: Diagnosis not present

## 2023-02-23 MED ORDER — AMOXICILLIN-POT CLAVULANATE 875-125 MG PO TABS: 1.0000 | ORAL_TABLET | Freq: Two times a day (BID) | ORAL | 0 refills | Status: AC

## 2023-02-23 NOTE — Telephone Encounter (Signed)
Virtual Visit via Telephone Note   I connected with Ms. Montie today by a video enabled telemedicine application and verified that I am speaking with the correct person using two identifiers.   Location: Patient: Home Provider: Office   I discussed the limitations of evaluation and management by telemedicine and the availability of in person appointments. The patient expressed understanding and agreed to proceed.   Pain in left lung feels like she is getting bronchiectasis exacerbation. No biopsies done with bronch, and BAL done on right side, feel risk of pneumotx is low. Encouraged use of flutter valve after stiolto. Augmentin 2 week course called in.   9 minutes spent in discussion with pt, chart review, medication changes

## 2023-02-24 LAB — CYTOLOGY - NON PAP

## 2023-02-24 NOTE — Progress Notes (Signed)
Pt states she developed pain in lower lobe.  Pt called dr yesterday (Sunday) and got antibiotic and states she feels much better today

## 2023-02-25 ENCOUNTER — Encounter (HOSPITAL_COMMUNITY): Payer: Self-pay | Admitting: Internal Medicine

## 2023-02-26 LAB — ACID FAST SMEAR (AFB, MYCOBACTERIA): Acid Fast Smear: NEGATIVE

## 2023-02-27 LAB — MTB-RIF NAA W AFB CULT, NON-SPUTUM

## 2023-02-28 LAB — SUSCEPTIBILITY, AER + ANAEROB

## 2023-02-28 LAB — SUSCEPTIBILITY RESULT

## 2023-03-06 LAB — CULTURE, RESPIRATORY W GRAM STAIN

## 2023-03-07 ENCOUNTER — Ambulatory Visit: Payer: Medicare Other | Admitting: Adult Health

## 2023-03-07 ENCOUNTER — Encounter: Payer: Self-pay | Admitting: Adult Health

## 2023-03-07 VITALS — Temp 97.7°F | Ht 69.0 in | Wt 144.2 lb

## 2023-03-07 DIAGNOSIS — J471 Bronchiectasis with (acute) exacerbation: Secondary | ICD-10-CM

## 2023-03-07 DIAGNOSIS — J449 Chronic obstructive pulmonary disease, unspecified: Secondary | ICD-10-CM

## 2023-03-07 DIAGNOSIS — K219 Gastro-esophageal reflux disease without esophagitis: Secondary | ICD-10-CM

## 2023-03-07 MED ORDER — ALBUTEROL SULFATE (2.5 MG/3ML) 0.083% IN NEBU: 2.5000 mg | INHALATION_SOLUTION | Freq: Four times a day (QID) | RESPIRATORY_TRACT | 5 refills | Status: DC | PRN

## 2023-03-07 MED ORDER — CIPROFLOXACIN HCL 500 MG PO TABS: 500.0000 mg | ORAL_TABLET | Freq: Two times a day (BID) | ORAL | 0 refills | Status: DC

## 2023-03-07 NOTE — Progress Notes (Signed)
@Patient  ID: Molly Ross, female    DOB: 1941/05/26, 82 y.o.   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 Medical history significant for breast cancer  TEST/EVENTS :  10/30/2015)Post broncho-dilator FEV1 1.36 L/51% and a ratio 60, total lung capacity 5.2 L/90%, DLCO 16/51%   CT chest November 20, 2022 biapical pleural-parenchymal scarring similar, mild bronchiectasis with tree-in-bud nodularity in the right upper lobe stable, widespread bronchial wall thickening and mild bronchiectasis in all lobes of the lungs, volume loss with peripheral airway impaction of the right middle lobe and lingula similar to previous.  Tree-in-bud opacity in the right lower lobe seems progressive.  Nodule in the left lower lobe 8 mm.  PFTs July 05, 2022 shows severe airflow obstruction with FEV1 at 48%, ratio 58, FVC 61%, diffusing capacity 57%, no significant bronchodilator response  03/07/2023 Follow up : COPD with emphysema, bronchiectasis, follow-up bronchoscopy Patient presents for a follow-up visit.  Patient has underlying COPD with emphysema and bronchiectasis.  Patient continued to have frequent bronchiectatic exacerbations requiring antibiotics.  He she was set up for CT chest December 6 that showed stable bronchiectatic changes and nodularity however progressive tree-in-bud opacities in the right lower lobe.  She was set up for bronchoscopy that was done February 21, 2023.  That showed very thick purulent secretions everywhere.  Cytology showed no malignant cells.  Acute inflammation with scattered benign bronchial cells.  AFB and fungal cultures have been negative to date.  BAL positive for Pseudomonas aeruginosa.  That was pansensitive.  Shortly after bronchoscopy patient had increased  symptoms with cough and congestion.  She was called in a 14-day course of Augmentin prior to final culture data.  Patient says she is feeling some better but continues to cough up green mucus throughout the day.  We discussed her culture results.  Patient denies any hemoptysis or fever. She remains on Stiolto daily.  Does get short of breath with activities.  Is using her flutter valve at least twice daily.  Currently not using any saline nebs or albuterol nebs.   No Known Allergies  Immunization History  Administered Date(s) Administered   Fluad Quad(high Dose 65+) 08/25/2019, 09/07/2020, 10/26/2021, 08/26/2022   Influenza, High Dose Seasonal PF 01/26/2014, 10/19/2014, 08/30/2015, 08/08/2016, 08/05/2017, 09/02/2018   Moderna Sars-Covid-2 Vaccination 12/29/2019, 01/26/2020   Pneumococcal Conjugate-13 12/25/2015   Pneumococcal Polysaccharide-23 12/30/2016   Tdap 09/03/2017   Zoster Recombinat (Shingrix) 03/25/2021    Past Medical History:  Diagnosis Date   Arthritis    Atherosclerosis of aorta ([place]) 02/02/2015   Noted on imaging    Bronchiectasis ([place])    Bronchiectasis with (acute) exacerbation ([place]) 06/09/2015   CAD (coronary artery disease), native coronary artery    Coronary calcification noted on CT scan 2016  Negative treadmill myoview 07/20/15 with EF 76%    Cancer ([place])    Cellulitis of left breast 10/01/2018   Colon polyp    COPD, moderate ([place]) 11/06/2015   Coronary artery calcification seen on CT scan    Coronary calcification noted on CT scan 2016  Negative treadmill myoview 07/20/15 with EF 76%    COVID 2020   had the infusion   Depressive disorder, not elsewhere classified 12/04/2011   Dyspnea    Emphysema of lung ([place]) 02/02/2015   Family history of lung  cancer    Family history of ovarian cancer    Family history of prostate cancer    Genetic testing 03/27/2018   The Common Hereditary Cancer Panel offered by [facility] includes sequencing and/or deletion duplication  testing of the following 47 genes: APC, ATM, AXIN2, BARD1, BMPR1A, BRCA1, BRCA2, BRIP1, CDH1, CDKN2A (p14ARF), CDKN2A (p16INK4a), CKD4, CHEK2, CTNNA1, DICER1, EPCAM (Deletion/duplication testing only), GREM1 (promoter region deletion/duplication testing only), KIT, MEN1, MLH1, MSH2, MSH3, MSH6, MU   GERD (gastroesophageal reflux disease)    Heart murmur    as a child after rheumatic fever, has not been heard since   Hiatal hernia 02/02/2015   small, noted on CT of chest    History of radiation therapy 04/16/18- 05/14/18   42.56 Gy directed to the Left Breast in 16 fractions followed by a boost of 8 Gy in 4 fractions   History of smoking 25-50 pack years 06/09/2015   Malignant neoplasm of lower-inner quadrant of left breast in female, estrogen receptor positive ([place]) 03/06/2018   Osteopenia 02/18/2018   DEXA 02/2018, T-1.8 R femoral neck   Personal history of radiation therapy 2019   Pleuritic pain 10/01/2018   Pneumonia    Shingles 1/09, 07/2010, 11/2017   Unspecified vitamin D deficiency 02/2011 and 2016   Vitamin D deficiency 02/14/2011    Tobacco History: Social History   Tobacco Use  Smoking Status Former   Packs/day: 2.00   Years: 24.00   Additional pack years: 0.00   Total pack years: 48.00   Types: Cigarettes   Quit date: 06/28/1980   Years since quitting: 42.7  Smokeless Tobacco Never  Tobacco Comments   smoked 1-2 PPD x 24 years;   Counseling given: Not Answered Tobacco comments: smoked 1-2 PPD x 24 years;   Outpatient Medications Prior to Visit  Medication Sig Dispense Refill   alendronate (FOSAMAX) 70 MG tablet Take 1 tablet (70 mg total) by mouth every 7 (seven) days. Take with a full glass of water on an empty stomach. 12 tablet 3   amoxicillin-clavulanate (AUGMENTIN) 875-125 MG tablet Take 1 tablet by mouth 2 (two) times daily for 14 days. 28 tablet 0   Ascorbic Acid (VITAMIN C) 100 MG tablet Take 1 tablet (100 mg total) by mouth daily. (Patient taking differently:  Take 500 mg by mouth daily.)     aspirin 81 MG EC tablet Take 1 tablet (81 mg total) by mouth daily. Swallow whole. 1 tablet 0   atorvastatin (LIPITOR) 20 MG tablet Take 1 tablet (20 mg total) by mouth daily. 90 tablet 1   Calcium Carb-Cholecalciferol (CALCIUM 600 + D PO) Take 1 tablet by mouth 2 (two) times daily.     Cholecalciferol (VITAMIN D) 50 MCG (2000 UT) CAPS Take 2,000 Units by mouth daily.     Coenzyme Q10 (COQ10 PO) Take 300 mg by mouth daily.     letrozole (FEMARA) 2.5 MG tablet TAKE 1 TABLET BY MOUTH EVERY DAY 30 tablet 0   Misc Natural Products (OSTEO BI-FLEX TRIPLE STRENGTH) TABS Take 1 tablet by mouth daily.     Multiple Vitamins-Minerals (MULTIVITAMIN WITH MINERALS) tablet Take 1 tablet by mouth daily. 50 + centrum silver     omeprazole (PRILOSEC OTC) 20 MG tablet Take 20 mg by mouth daily.     sodium chloride HYPERTONIC 3 % nebulizer solution Take by nebulization as needed for other. (Patient taking differently: Take 4 mLs by nebulization daily as needed for other or cough.) 750 mL 12   Tiotropium  Bromide-Olodaterol (STIOLTO RESPIMAT) 2.5-2.5 MCG/ACT AERS Inhale 2 puffs into the lungs daily. 4 g 5   traMADol (ULTRAM) 50 MG tablet Take 1 tablet (50 mg total) by mouth 3 (three) times daily as needed. 42 tablet 1   nitroGLYCERIN (NITROSTAT) 0.4 MG SL tablet Place 1 tablet (0.4 mg total) under the tongue every 5 (five) minutes as needed. (Patient taking differently: Place 0.4 mg under the tongue every 5 (five) minutes as needed for chest pain.) 25 tablet 6   No facility-administered medications prior to visit.     Review of Systems:   Constitutional:   No  weight loss, night sweats,  Fevers, chills, fatigue, or  lassitude.  HEENT:   No headaches,  Difficulty swallowing,  Tooth/dental problems, or  Sore throat,                No sneezing, itching, ear ache, nasal congestion, post nasal drip,   CV:  No chest pain,  Orthopnea, PND, swelling in lower extremities, anasarca,  dizziness, palpitations, syncope.   GI  No heartburn, indigestion, abdominal pain, nausea, vomiting, diarrhea, change in bowel habits, loss of appetite, bloody stools.   Resp: No shortness of breath with exertion or at rest.  No excess mucus, no productive cough,  No non-productive cough,  No coughing up of blood.  No change in color of mucus.  No wheezing.  No chest wall deformity  Skin: no rash or lesions.  GU: no dysuria, change in color of urine, no urgency or frequency.  No flank pain, no hematuria   MS:  No joint pain or swelling.  No decreased range of motion.  No back pain.    Physical Exam  Temp 97.7 F (36.5 C) (Oral)   Ht 5\' 9"  (1.753 m)   Wt 144 lb 3.2 oz (65.4 kg)   SpO2 92%   BMI 21.29 kg/m   GEN: A/Ox3; pleasant , NAD, well nourished    HEENT:  [place]/AT,  EACs-clear, TMs-wnl, NOSE-clear, THROAT-clear, no lesions, no postnasal drip or exudate noted.   NECK:  Supple w/ fair ROM; no JVD; normal carotid impulses w/o bruits; no thyromegaly or nodules palpated; no lymphadenopathy.    RESP few trace rhonchi  no accessory muscle use, no dullness to percussion  Ross:  RRR, no m/r/g, no peripheral edema, pulses intact, no cyanosis or clubbing.  GI:   Soft & nt; nml bowel sounds; no organomegaly or masses detected.   Musco: Warm bil, no deformities or joint swelling noted.   Neuro: alert, no focal deficits noted.    Skin: Warm, no lesions or rashes    Lab Results:  CBC   Imaging: No results found.       Latest Ref Rng & Units 07/05/2022    9:42 AM 10/30/2015    4:38 PM  PFT Results  FVC-Pre L 2.03  2.13   FVC-Predicted Pre % 62  61   FVC-Post L 2.01  2.26   FVC-Predicted Post % 61  64   Pre FEV1/FVC % % 58  60   Post FEV1/FCV % % 58  60   FEV1-Pre L 1.17  1.27   FEV1-Predicted Pre % 47  48   FEV1-Post L 1.17  1.36   DLCO uncorrected ml/min/mmHg 12.66  16.04   DLCO UNC% % 57  51   DLCO corrected ml/min/mmHg 12.95    DLCO COR %Predicted % 58     DLVA Predicted % 110  83   TLC L  5.24   TLC % Predicted %  90   RV % Predicted %  118     No results found for: "NITRICOXIDE"      Assessment & Plan:   Bronchiectasis with (acute) exacerbation (H[facility]) Recurrent bronchiectatic exacerbation with daily symptom burden.  Underwent bronchoscopy earlier this month with culture showing Pseudomonas. -Pansensitive.  Patient continues to have significant discolored mucus despite Augmentin.  Will change over to Cipro.  Treat for 10 days.  Reviewed her test results in detail with her and her friend. Patient is encouraged on mucociliary clearance.  Advised to use flutter valve at least 2-3 times a day.  May use her saline neb or albuterol daily as needed.  Continue on Stiolto.  Have her follow back up in 4 to 6 weeks.  Consider chest x-ray at follow-up visit.  Plan  Patient Instructions  Stop Augmentin  Begin Cipro 500mg  Twice daily  for 10 days , take with food.  Continue on Stiolto 2 puffs daily  Mucinex Twice daily  As needed  cough/congestion  Activity as tolerated.  Flutter valve Twice daily   Albuterol inhaler or neb As needed   Follow up with Dr. Chase Caller in 6 weeks and As needed   Please contact office for sooner follow up if symptoms do not improve or worsen or seek emergency care     COPD, moderate ([place]) Appears stable.  Continue with mucociliary clearance.  Continue on Stiolto.  Plan  Patient Instructions  Stop Augmentin  Begin Cipro 500mg  Twice daily  for 10 days , take with food.  Continue on Stiolto 2 puffs daily  Mucinex Twice daily  As needed  cough/congestion  Activity as tolerated.  Flutter valve Twice daily   Albuterol inhaler or neb As needed   Follow up with Dr. Chase Caller in 6 weeks and As needed   Please contact office for sooner follow up if symptoms do not improve or worsen or seek emergency care     GERD (gastroesophageal reflux disease) Continue with GERD diet.  Continue on Prilosec daily     Rexene Edison, NP 03/07/2023

## 2023-03-07 NOTE — Assessment & Plan Note (Signed)
Appears stable.  Continue with mucociliary clearance.  Continue on Stiolto.  Plan  Patient Instructions  Stop Augmentin  Begin Cipro 500mg  Twice daily  for 10 days , take with food.  Continue on Stiolto 2 puffs daily  Mucinex Twice daily  As needed  cough/congestion  Activity as tolerated.  Flutter valve Twice daily   Albuterol inhaler or neb As needed   Follow up with Dr. Chase Caller in 6 weeks and As needed   Please contact office for sooner follow up if symptoms do not improve or worsen or seek emergency care

## 2023-03-07 NOTE — Patient Instructions (Addendum)
Stop Augmentin  Begin Cipro 500mg  Twice daily  for 10 days , take with food.  Continue on Stiolto 2 puffs daily  Mucinex Twice daily  As needed  cough/congestion  Activity as tolerated.  Flutter valve Twice daily   Albuterol inhaler or neb As needed   Follow up with Dr. Chase Caller in 6 weeks and As needed   Please contact office for sooner follow up if symptoms do not improve or worsen or seek emergency care

## 2023-03-07 NOTE — Assessment & Plan Note (Signed)
Recurrent bronchiectatic exacerbation with daily symptom burden.  Underwent bronchoscopy earlier this month with culture showing Pseudomonas. -Pansensitive.  Patient continues to have significant discolored mucus despite Augmentin.  Will change over to Cipro.  Treat for 10 days.  Reviewed her test results in detail with her and her friend. Patient is encouraged on mucociliary clearance.  Advised to use flutter valve at least 2-3 times a day.  May use her saline neb or albuterol daily as needed.  Continue on Stiolto.  Have her follow back up in 4 to 6 weeks.  Consider chest x-ray at follow-up visit.  Plan  Patient Instructions  Stop Augmentin  Begin Cipro 500mg  Twice daily  for 10 days , take with food.  Continue on Stiolto 2 puffs daily  Mucinex Twice daily  As needed  cough/congestion  Activity as tolerated.  Flutter valve Twice daily   Albuterol inhaler or neb As needed   Follow up with Dr. Chase Caller in 6 weeks and As needed   Please contact office for sooner follow up if symptoms do not improve or worsen or seek emergency care

## 2023-03-07 NOTE — Assessment & Plan Note (Signed)
Continue with GERD diet.  Continue on Prilosec daily

## 2023-03-10 LAB — PNEUMOCYSTIS JIROVECI SMEAR BY DFA

## 2023-03-27 LAB — FUNGUS CULTURE WITH STAIN

## 2023-03-27 LAB — FUNGUS CULTURE RESULT

## 2023-03-27 LAB — FUNGAL ORGANISM REFLEX

## 2023-04-09 LAB — ACID FAST CULTURE WITH REFLEXED SENSITIVITIES (MYCOBACTERIA): Acid Fast Culture: NEGATIVE

## 2023-04-10 LAB — MTB-RIF NAA W AFB CULT, NON-SPUTUM

## 2023-04-21 ENCOUNTER — Ambulatory Visit: Payer: Medicare Other | Admitting: Adult Health

## 2023-04-29 ENCOUNTER — Ambulatory Visit (INDEPENDENT_AMBULATORY_CARE_PROVIDER_SITE_OTHER): Payer: Medicare Other

## 2023-04-29 ENCOUNTER — Encounter: Payer: Self-pay | Admitting: Adult Health

## 2023-04-29 ENCOUNTER — Ambulatory Visit: Payer: Medicare Other | Admitting: Adult Health

## 2023-04-29 VITALS — BP 134/76 | HR 81 | Temp 97.7°F | Ht 69.0 in | Wt 142.6 lb

## 2023-04-29 DIAGNOSIS — J471 Bronchiectasis with (acute) exacerbation: Secondary | ICD-10-CM

## 2023-04-29 DIAGNOSIS — J449 Chronic obstructive pulmonary disease, unspecified: Secondary | ICD-10-CM | POA: Diagnosis not present

## 2023-04-29 DIAGNOSIS — J479 Bronchiectasis, uncomplicated: Secondary | ICD-10-CM | POA: Diagnosis not present

## 2023-04-29 DIAGNOSIS — J309 Allergic rhinitis, unspecified: Secondary | ICD-10-CM | POA: Insufficient documentation

## 2023-04-29 DIAGNOSIS — J301 Allergic rhinitis due to pollen: Secondary | ICD-10-CM

## 2023-04-29 NOTE — Assessment & Plan Note (Signed)
Severe COPD with significant symptom burden.  Continue on Stiolto.  Try to avoid ICS if possible as she has underlying bronchiectasis with recurrent infections. Add vest therapy to help with mucociliary clearance  Plan  Patient Instructions  Continue on Stiolto 2 puffs daily  Mucinex Twice daily  As needed  cough/congestion  Activity as tolerated.  Flutter valve Twice daily   Albuterol inhaler or neb As needed -try Albuterol neb daily .   Begin VEST therapy as discussed.  Chest xray today .   Saline nasal spray Twice daily   Begin Flonase 2 puffs daily in am for 2 weeks and then  As needed   Begin Claritin 10mg  daily for 5 days, and then As needed    Follow up with Dr. Marchelle Gearing in 3 months and As needed   Please contact office for sooner follow up if symptoms do not improve or worsen or seek emergency care

## 2023-04-29 NOTE — Patient Instructions (Addendum)
Continue on Stiolto 2 puffs daily  Mucinex Twice daily  As needed  cough/congestion  Activity as tolerated.  Flutter valve Twice daily   Albuterol inhaler or neb As needed -try Albuterol neb daily .   Begin VEST therapy as discussed.  Chest xray today .   Saline nasal spray Twice daily   Begin Flonase 2 puffs daily in am for 2 weeks and then  As needed   Begin Claritin 10mg  daily for 5 days, and then As needed    Follow up with Dr. Marchelle Gearing in 3 months and As needed   Please contact office for sooner follow up if symptoms do not improve or worsen or seek emergency care

## 2023-04-29 NOTE — Progress Notes (Signed)
@Patient  ID: Molly Ross, female    DOB: 07-03-1941, 82 y.o.   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  Tree-in-bud opacity in the right lower lobe seems progressive.  Nodule in the left lower lobe 8 mm.   PFTs July 05, 2022 shows severe airflow obstruction with FEV1 at 48%, ratio 58, FVC 61%, diffusing capacity 57%, no significant bronchodilator response  Bronchoscopy February 21, 2023- thick purulent secretions e.  Cytology showed no malignant cells.  Acute inflammation with scattered benign bronchial cells.  AFB and fungal cultures negative  BAL positive for Pseudomonas aeruginosa.  That was pansensitive.   04/29/2023 Follow up ; Bronchiectasis and COPD  Patient returns for 62-month follow-up.  Patient has underlying COPD with emphysema and bronchiectasis.  Prone to frequent COPD exacerbations and bronchiectasis this is flared.  CT chest November 20, 2022 showed progressive opacities in the right lower lobe.  She underwent a bronchoscopy on February 21, 2023.  BAL was positive for Pseudomonas.  That was pansensitive.  Last visit patient was treated with Cipro for 10 days.  Continued on mucociliary clearance with flutter valve.  She remains on Stiolto daily.  Since last visit she is feeling  better with decreased thick mucus. Energy level is some better.  She continues to have ongoing daily cough with thick mucus.  Does have daily cough with intermittent yellow mucus. Has noticed more nasal congestion and drainage, sneezing. Coughing does cause some sinus pressure.  Uses Flutter valve Twice daily. Uses albuterol neb three times a week.  We discussed vest therapy.  Works part time at [employer]. Staying active.    No Known Allergies  Immunization History  Administered Date(s) Administered   Fluad Quad(high Dose 65+) 08/25/2019, 09/07/2020, 10/26/2021, 08/26/2022   Influenza, High Dose Seasonal PF 01/26/2014, 10/19/2014, 08/30/2015, 08/08/2016, 08/05/2017, 09/02/2018   Moderna Sars-Covid-2 Vaccination 12/29/2019, 01/26/2020   Pneumococcal Conjugate-13 12/25/2015   Pneumococcal Polysaccharide-23 12/30/2016   Tdap 09/03/2017   Zoster Recombinat (Shingrix) 03/25/2021    Past Medical History:  Diagnosis Date   Arthritis    Atherosclerosis of aorta (HCC) 02/02/2015   Noted on imaging    Bronchiectasis (HCC)    Bronchiectasis with (acute) exacerbation (HCC) 06/09/2015   CAD (coronary artery disease), native coronary artery    Coronary calcification noted on CT scan 2016  Negative treadmill myoview 07/20/15 with EF 76%    Cancer (HCC)    Cellulitis of left breast 10/01/2018   Colon polyp    COPD, moderate (HCC) 11/06/2015   Coronary artery calcification seen on CT scan    Coronary calcification noted on CT scan 2016  Negative treadmill myoview 07/20/15 with EF 76%    COVID 2020   had the infusion   Depressive disorder, not elsewhere classified 12/04/2011   Dyspnea    Emphysema of lung (HCC) 02/02/2015   Family history of lung  cancer    Family history of ovarian cancer    Family history of prostate cancer    Genetic testing 03/27/2018   The Common Hereditary Cancer Panel offered by [facility] includes sequencing and/or deletion duplication testing of the following 47 genes: APC,  ATM, AXIN2, BARD1, BMPR1A, BRCA1, BRCA2, BRIP1, CDH1, CDKN2A (p14ARF), CDKN2A (p16INK4a), CKD4, CHEK2, CTNNA1, DICER1, EPCAM (Deletion/duplication testing only), GREM1 (promoter region deletion/duplication testing only), KIT, MEN1, MLH1, MSH2, MSH3, MSH6, MU   GERD (gastroesophageal reflux disease)    Heart murmur    as a child after rheumatic fever, has not been heard since   Hiatal hernia 02/02/2015   small, noted on CT of chest    History of radiation therapy 04/16/18- 05/14/18   42.56 Gy directed to the Left Breast in 16 fractions followed by a boost of 8 Gy in 4 fractions   History of smoking 25-50 pack years 06/09/2015   Malignant neoplasm of lower-inner quadrant of left breast in female, 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   Osteopenia 02/18/2018   DEXA 02/2018, T-1.8 R femoral neck   Personal history of radiation therapy 2019   Pleuritic pain 10/01/2018   Pneumonia    Shingles 1/09, 07/2010, 11/2017   Unspecified vitamin D deficiency 02/2011 and 2016   Vitamin D deficiency 02/14/2011    Tobacco History: Social History   Tobacco Use  Smoking Status Former   Packs/day: 2.00   Years: 24.00   Additional pack years: 0.00   Total pack years: 48.00   Types: Cigarettes   Quit date: 06/28/1980   Years since quitting: 42.8  Smokeless Tobacco Never  Tobacco Comments   smoked 1-2 PPD x 24 years;   Counseling given: Not Answered Tobacco comments: smoked 1-2 PPD x 24 years;   Outpatient Medications Prior to Visit  Medication Sig Dispense Refill   albuterol (PROVENTIL) (2.5 MG/3ML) 0.083% nebulizer solution Take 3 mLs (2.5 mg total) by nebulization every 6 (six) hours as needed for wheezing or shortness of breath. 75 mL 5   alendronate (FOSAMAX) 70 MG tablet Take 1 tablet (70 mg total) by mouth every 7 (seven) days. Take with a full glass of water on an empty stomach. 12 tablet 3   Ascorbic Acid (VITAMIN C) 100 MG tablet Take 1 tablet (100 mg total) by mouth daily. (Patient  taking differently: Take 500 mg by mouth daily.)     aspirin 81 MG EC tablet Take 1 tablet (81 mg total) by mouth daily. Swallow whole. 1 tablet 0   atorvastatin (LIPITOR) 20 MG tablet Take 1 tablet (20 mg total) by mouth daily. 90 tablet 1   Calcium Carb-Cholecalciferol (CALCIUM 600 + D PO) Take 1 tablet by mouth 2 (two) times daily.     Cholecalciferol (VITAMIN D) 50 MCG (2000 UT) CAPS Take 2,000 Units by mouth daily.     ciprofloxacin (CIPRO) 500 MG tablet Take 1 tablet (500 mg total) by mouth 2 (two) times daily. 20 tablet 0   Coenzyme Q10 (COQ10 PO) Take 300 mg by mouth daily.     letrozole (FEMARA) 2.5 MG tablet TAKE 1 TABLET BY MOUTH EVERY DAY 30 tablet 0   Misc Natural Products (OSTEO BI-FLEX TRIPLE STRENGTH) TABS Take 1 tablet by mouth daily.     Multiple Vitamins-Minerals (MULTIVITAMIN WITH MINERALS) tablet Take 1 tablet by mouth daily. 50 + centrum silver     omeprazole (PRILOSEC OTC) 20 MG tablet Take 20 mg by mouth daily.     sodium chloride HYPERTONIC  3 % nebulizer solution Take by nebulization as needed for other. (Patient taking differently: Take 4 mLs by nebulization daily as needed for other or cough.) 750 mL 12   Tiotropium Bromide-Olodaterol (STIOLTO RESPIMAT) 2.5-2.5 MCG/ACT AERS Inhale 2 puffs into the lungs daily. 4 g 5   traMADol (ULTRAM) 50 MG tablet Take 1 tablet (50 mg total) by mouth 3 (three) times daily as needed. 42 tablet 1   nitroGLYCERIN (NITROSTAT) 0.4 MG SL tablet Place 1 tablet (0.4 mg total) under the tongue every 5 (five) minutes as needed. (Patient taking differently: Place 0.4 mg under the tongue every 5 (five) minutes as needed for chest pain.) 25 tablet 6   No facility-administered medications prior to visit.     Review of Systems:   Constitutional:   No  weight loss, night sweats,  Fevers, chills, fatigue, or  lassitude.  HEENT:   No  Difficulty swallowing,  Tooth/dental problems, or  Sore throat,                No sneezing, itching, ear ache,   +nasal congestion, post nasal drip,   CV:  No chest pain,  Orthopnea, PND, swelling in lower extremities, anasarca, dizziness, palpitations, syncope.   GI  No heartburn, indigestion, abdominal pain, nausea, vomiting, diarrhea, change in bowel habits, loss of appetite, bloody stools.   Resp:   No chest wall deformity  Skin: no rash or lesions.  GU: no dysuria, change in color of urine, no urgency or frequency.  No flank pain, no hematuria   MS:  No joint pain or swelling.  No decreased range of motion.  No back pain.    Physical Exam  BP 134/76 (BP Location: Right Arm, Patient Position: Sitting, Cuff Size: Normal)   Pulse 81   Temp 97.7 F (36.5 C) (Oral)   Ht 5\' 9"  (1.753 m)   Wt 142 lb 9.6 oz (64.7 kg)   SpO2 93%   BMI 21.06 kg/m   GEN: A/Ox3; pleasant , NAD, well nourished    HEENT:  [place]/AT,  NOSE-clear, THROAT-clear, no lesions, no postnasal drip or exudate noted.   NECK:  Supple w/ fair ROM; no JVD; normal carotid impulses w/o bruits; no thyromegaly or nodules palpated; no lymphadenopathy.    RESP scattered rhonchi   no accessory muscle use, no dullness to percussion  CARD:  RRR, no m/r/g, no peripheral edema, pulses intact, no cyanosis or clubbing.  GI:   Soft & nt; nml bowel sounds; no organomegaly or masses detected.   Musco: Warm bil, no deformities or joint swelling noted.   Neuro: alert, no focal deficits noted.    Skin: Warm, no lesions or rashes    Lab Results:  CBC    ProBNP No results found for: "PROBNP"  Imaging: No results found.       Latest Ref Rng & Units 07/05/2022    9:42 AM 10/30/2015    4:38 PM  PFT Results  FVC-Pre L 2.03  2.13   FVC-Predicted Pre % 62  61   FVC-Post L 2.01  2.26   FVC-Predicted Post % 61  64   Pre FEV1/FVC % % 58  60   Post FEV1/FCV % % 58  60   FEV1-Pre L 1.17  1.27   FEV1-Predicted Pre % 47  48   FEV1-Post L 1.17  1.36   DLCO uncorrected ml/min/mmHg 12.66  16.04   DLCO UNC% % 57  51   DLCO  corrected ml/min/mmHg 12.95  DLCO COR %Predicted % 58    DLVA Predicted % 110  83   TLC L  5.24   TLC % Predicted %  90   RV % Predicted %  118     No results found for: "NITRICOXIDE"      Assessment & Plan:   Bronchiectasis with (acute) exacerbation (HCC) Prone to recurrent bronchiectatic exacerbations has daily cough with thick mucus, recently treated for recurrent superimposed Pseudomonas bronchiectatic exacerbation.-She is on maximum maintenance regimen with dual bronchodilators, flutter valve, nebulizer but continues to have ongoing daily symptoms.  Will add in vest therapy.  Patient education on vest. Check chest x-ray today-May need to repeat CT chest to further evaluate bronchiectasis.  Plan  Patient Instructions  Continue on Stiolto 2 puffs daily  Mucinex Twice daily  As needed  cough/congestion  Activity as tolerated.  Flutter valve Twice daily   Albuterol inhaler or neb As needed -try Albuterol neb daily .   Begin VEST therapy as discussed.  Chest xray today .   Saline nasal spray Twice daily   Begin Flonase 2 puffs daily in am for 2 weeks and then  As needed   Begin Claritin 10mg  daily for 5 days, and then As needed    Follow up with Dr. Marchelle Gearing in 3 months and As needed   Please contact office for sooner follow up if symptoms do not improve or worsen or seek emergency care      COPD, moderate (HCC) Severe COPD with significant symptom burden.  Continue on Stiolto.  Try to avoid ICS if possible as she has underlying bronchiectasis with recurrent infections. Add vest therapy to help with mucociliary clearance  Plan  Patient Instructions  Continue on Stiolto 2 puffs daily  Mucinex Twice daily  As needed  cough/congestion  Activity as tolerated.  Flutter valve Twice daily   Albuterol inhaler or neb As needed -try Albuterol neb daily .   Begin VEST therapy as discussed.  Chest xray today .   Saline nasal spray Twice daily   Begin Flonase 2 puffs daily  in am for 2 weeks and then  As needed   Begin Claritin 10mg  daily for 5 days, and then As needed    Follow up with Dr. Marchelle Gearing in 3 months and As needed   Please contact office for sooner follow up if symptoms do not improve or worsen or seek emergency care      Allergic rhinitis Flare -add Claritin and Flonase  Plan  Patient Instructions  Continue on Stiolto 2 puffs daily  Mucinex Twice daily  As needed  cough/congestion  Activity as tolerated.  Flutter valve Twice daily   Albuterol inhaler or neb As needed -try Albuterol neb daily .   Begin VEST therapy as discussed.  Chest xray today .   Saline nasal spray Twice daily   Begin Flonase 2 puffs daily in am for 2 weeks and then  As needed   Begin Claritin 10mg  daily for 5 days, and then As needed    Follow up with Dr. Marchelle Gearing in 3 months and As needed   Please contact office for sooner follow up if symptoms do not improve or worsen or seek emergency care       Rubye Oaks, NP 04/29/2023

## 2023-04-29 NOTE — Assessment & Plan Note (Addendum)
Prone to recurrent bronchiectatic exacerbations has daily cough with thick mucus, recently treated for recurrent superimposed Pseudomonas bronchiectatic exacerbation.-She is on maximum maintenance regimen with dual bronchodilators, flutter valve, nebulizer but continues to have ongoing daily symptoms.  Will add in vest therapy.  Patient education on vest. Check chest x-ray today-May need to repeat CT chest to further evaluate bronchiectasis.  Plan  Patient Instructions  Continue on Stiolto 2 puffs daily  Mucinex Twice daily  As needed  cough/congestion  Activity as tolerated.  Flutter valve Twice daily   Albuterol inhaler or neb As needed -try Albuterol neb daily .   Begin VEST therapy as discussed.  Chest xray today .   Saline nasal spray Twice daily   Begin Flonase 2 puffs daily in am for 2 weeks and then  As needed   Begin Claritin 10mg  daily for 5 days, and then As needed    Follow up with Dr. Marchelle Gearing in 3 months and As needed   Please contact office for sooner follow up if symptoms do not improve or worsen or seek emergency care

## 2023-04-29 NOTE — Assessment & Plan Note (Signed)
Flare -add Claritin and Flonase  Plan  Patient Instructions  Continue on Stiolto 2 puffs daily  Mucinex Twice daily  As needed  cough/congestion  Activity as tolerated.  Flutter valve Twice daily   Albuterol inhaler or neb As needed -try Albuterol neb daily .   Begin VEST therapy as discussed.  Chest xray today .   Saline nasal spray Twice daily   Begin Flonase 2 puffs daily in am for 2 weeks and then  As needed   Begin Claritin 10mg  daily for 5 days, and then As needed    Follow up with Dr. Marchelle Gearing in 3 months and As needed   Please contact office for sooner follow up if symptoms do not improve or worsen or seek emergency care

## 2023-04-30 NOTE — Addendum Note (Signed)
Addended by: Delrae Rend on: 04/30/2023 05:36 PM   Modules accepted: Orders

## 2023-05-06 LAB — MTB-RIF NAA WITH AFB CULTURE, SPUTUM: Acid Fast Culture: NEGATIVE

## 2023-05-13 NOTE — Progress Notes (Signed)
Called and spoke with patient, provided results per Rubye Oaks NP.  She verified understanding.  Nothing further needed.

## 2023-05-14 ENCOUNTER — Telehealth: Payer: Self-pay | Admitting: *Deleted

## 2023-05-14 DIAGNOSIS — M810 Age-related osteoporosis without current pathological fracture: Secondary | ICD-10-CM

## 2023-05-14 NOTE — Telephone Encounter (Signed)
Patent called and states that you offered to put her on Prolia at her CPE in Feb. She said she cannot remember to taker her weekly alendronate and would like to see how much Prolia would be, you had said it could be costly.

## 2023-05-14 NOTE — Telephone Encounter (Signed)
Molly Ross, please look into cost for patient.  She now states having trouble remembering to take weekly alendronate. Of course, another option for her would be a monthly bisphosphonate.  We can present that as an option to her as well, for her to decide.  Here is the relevant info from my last visit:  Osteoporosis:  She was started on Alendronate in 02/2020 for osteopenia with elevated FRAX score (4.6% for hip). She tolerated this without any side effects, but stopped taking it when the prescription ran out (after 1 year). She was restarted on this 11/2021. 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

## 2023-06-08 DIAGNOSIS — J479 Bronchiectasis, uncomplicated: Secondary | ICD-10-CM | POA: Diagnosis not present

## 2023-06-17 NOTE — Telephone Encounter (Signed)
I have reached out to [facility] to find out if prolia will be approved since pt has tried alendronate before. Waiting on response

## 2023-06-20 NOTE — Telephone Encounter (Signed)
I have sent additional office notes and test results to see if this can get approved

## 2023-06-23 DIAGNOSIS — M25512 Pain in left shoulder: Secondary | ICD-10-CM | POA: Diagnosis not present

## 2023-06-24 NOTE — Telephone Encounter (Signed)
Insurance has declines Prolia and said that the following is covered under plan. Please advise  Reclast Zoledronic Acid Zometa Boniva Risedronate

## 2023-06-24 NOTE — Telephone Encounter (Signed)
Please advise pt of her options-- She can try a once a month oral medication (Boniva, which is taken the same way her alendronate was), or she can take a once yearly infusion of Reclast. When she lets [place] know which she prefers, and either send in a year supply of Boniva (#3 with 3 refills), or set up the Reclast infusion.  Thanks

## 2023-06-25 DIAGNOSIS — M7542 Impingement syndrome of left shoulder: Secondary | ICD-10-CM | POA: Diagnosis not present

## 2023-06-25 MED ORDER — IBANDRONATE SODIUM 150 MG PO TABS: 150.0000 mg | ORAL_TABLET | ORAL | 3 refills | Status: DC

## 2023-06-25 NOTE — Telephone Encounter (Signed)
Pt is agreeable to trying Boniva and I have sent this in. I have discontinued the alendronate.  I [facility] sent medication in to [facility]

## 2023-06-25 NOTE — Telephone Encounter (Signed)
Left message for pt to call me back 

## 2023-06-25 NOTE — Addendum Note (Signed)
Addended by: Herminio Commons A on: 06/25/2023 03:29 PM   Modules accepted: Orders

## 2023-06-28 ENCOUNTER — Telehealth: Payer: Self-pay | Admitting: Pulmonary Disease

## 2023-06-28 MED ORDER — CIPROFLOXACIN HCL 500 MG PO TABS: 500.0000 mg | ORAL_TABLET | Freq: Two times a day (BID) | ORAL | 0 refills | Status: DC

## 2023-06-28 MED ORDER — PREDNISONE 10 MG PO TABS: ORAL_TABLET | ORAL | 0 refills | Status: AC

## 2023-06-28 NOTE — Telephone Encounter (Signed)
She has bronchiectasis with history of colonization with Pseudomonas, pansensitive. Previously has responded well to ciprofloxacin. She called complaining of shortness of breath, green sputum, no wheezing Will call in Levaquin and low-dose prednisone, to take prednisone only if she develops wheezing Continue airway clearance measures with vest, flutter valve and Mucinex Call office for appointment next week if no better

## 2023-07-02 ENCOUNTER — Ambulatory Visit: Payer: Medicare Other | Admitting: Internal Medicine

## 2023-07-02 ENCOUNTER — Ambulatory Visit (INDEPENDENT_AMBULATORY_CARE_PROVIDER_SITE_OTHER): Payer: Medicare Other

## 2023-07-02 ENCOUNTER — Encounter: Payer: Self-pay | Admitting: Internal Medicine

## 2023-07-02 VITALS — BP 140/72 | HR 104 | Temp 98.8°F | Ht 69.0 in | Wt 137.0 lb

## 2023-07-02 DIAGNOSIS — J471 Bronchiectasis with (acute) exacerbation: Secondary | ICD-10-CM

## 2023-07-02 DIAGNOSIS — R059 Cough, unspecified: Secondary | ICD-10-CM | POA: Diagnosis not present

## 2023-07-02 DIAGNOSIS — R918 Other nonspecific abnormal finding of lung field: Secondary | ICD-10-CM | POA: Diagnosis not present

## 2023-07-02 MED ORDER — AMOXICILLIN-POT CLAVULANATE 875-125 MG PO TABS: 1.0000 | ORAL_TABLET | Freq: Two times a day (BID) | ORAL | 0 refills | Status: DC

## 2023-07-02 NOTE — Progress Notes (Signed)
Molly Ross, female    DOB: 1941/09/15   MRN: [id number]   Brief patient profile:  44   yowf  quit smoking 1981 self referred to pulmonary clinic 07/02/2023 for bronchiectasis / already has flutter valve but VEST still awaiting approval    History of Present Illness  07/02/2023  Pulmonary/ Acute  eval/Molly Ross / maint on stiolto, has mucinex and tramadol but not taking  Chief Complaint  Patient presents with   Acute Visit    Cough, wheeze, SOB and pain in chest with deep breaths x 5 days.  Dyspnea:  baseline MMRC2 = can't walk a nl pace on a flat grade s sob but does fine slow and flat eg on stiolto 2 daily but sob x 100 ft since onset of pleuritic cp/ already called in pred 06/28/23  Cough: worse than usual turing green and pleuritic cp /  ? Fever 06/28/23 rx cipro no change in sputum  Sleep: flat bed/ wedge pillow had to add another  SABA use: not needed so far 02:  none  CP recurrent pattern just like in past started with Right upper ant and then am of ov worse on L, classically pleuritic  No obvious other day to day or daytime pattern/variability or assoc  mucus plugs or hemoptysis or  chest tightness, subjective wheeze or overt sinus or hb symptoms.     Also denies any obvious fluctuation of symptoms with weather or environmental changes or other aggravating or alleviating factors except as outlined above   No unusual exposure hx or h/o childhood pna/ asthma or knowledge of premature birth.  Current Allergies, Complete Past Medical History, Past Surgical History, Family History, and Social History were reviewed in [employer] record.  ROS  The following are not active complaints unless bolded Hoarseness, sore throat, dysphagia, dental problems, itching, sneezing,  nasal congestion or discharge of excess mucus or purulent secretions, ear ache,   fever, chills, sweats, unintended wt loss or wt gain, classically  exertional cp,  orthopnea pnd or arm/hand swelling   or leg swelling, presyncope, palpitations, abdominal pain, anorexia, nausea, vomiting, diarrhea  or change in bowel habits or change in bladder habits, change in stools or change in urine, dysuria, hematuria,  rash, arthralgias, visual complaints, headache, numbness, weakness or ataxia or problems with walking or coordination,  change in mood or  memory.             Outpatient Medications Prior to Visit  Medication Sig Dispense Refill   albuterol (PROVENTIL) (2.5 MG/3ML) 0.083% nebulizer solution Take 3 mLs (2.5 mg total) by nebulization every 6 (six) hours as needed for wheezing or shortness of breath. 75 mL 5   Ascorbic Acid (VITAMIN C) 100 MG tablet Take 1 tablet (100 mg total) by mouth daily. (Patient taking differently: Take 500 mg by mouth daily.)     aspirin 81 MG EC tablet Take 1 tablet (81 mg total) by mouth daily. Swallow whole. 1 tablet 0   atorvastatin (LIPITOR) 20 MG tablet Take 1 tablet (20 mg total) by mouth daily. 90 tablet 1   Calcium Carb-Cholecalciferol (CALCIUM 600 + D PO) Take 1 tablet by mouth 2 (two) times daily.     Cholecalciferol (VITAMIN D) 50 MCG (2000 UT) CAPS Take 2,000 Units by mouth daily.     ciprofloxacin (CIPRO) 500 MG tablet Take 1 tablet (500 mg total) by mouth 2 (two) times daily. 20 tablet 0   Coenzyme Q10 (COQ10 PO) Take 300 mg  by mouth daily.     ibandronate (BONIVA) 150 MG tablet Take 1 tablet (150 mg total) by mouth every 30 (thirty) days. Take in the morning with a full glass of water, on an empty stomach, and do not take anything else by mouth or lie down for the next 30 min. 3 tablet 3   letrozole (FEMARA) 2.5 MG tablet TAKE 1 TABLET BY MOUTH EVERY DAY 30 tablet 0   Misc Natural Products (OSTEO BI-FLEX TRIPLE STRENGTH) TABS Take 1 tablet by mouth daily.     Multiple Vitamins-Minerals (MULTIVITAMIN WITH MINERALS) tablet Take 1 tablet by mouth daily. 50 + centrum silver     omeprazole (PRILOSEC OTC) 20 MG tablet Take 20 mg by mouth daily.      predniSONE (DELTASONE) 10 MG tablet Take 2 tablets (20 mg total) by mouth daily with breakfast for 5 days, THEN 1 tablet (10 mg total) daily with breakfast for 5 days. 20 tablet 0   sodium chloride HYPERTONIC 3 % nebulizer solution Take by nebulization as needed for other. (Patient taking differently: Take 4 mLs by nebulization daily as needed for other or cough.) 750 mL 12   Tiotropium Bromide-Olodaterol (STIOLTO RESPIMAT) 2.5-2.5 MCG/ACT AERS Inhale 2 puffs into the lungs daily. 4 g 5   traMADol (ULTRAM) 50 MG tablet Take 1 tablet (50 mg total) by mouth 3 (three) times daily as needed. 42 tablet 1   ciprofloxacin (CIPRO) 500 MG tablet Take 1 tablet (500 mg total) by mouth 2 (two) times daily. (Patient not taking: Reported on 07/02/2023) 20 tablet 0   nitroGLYCERIN (NITROSTAT) 0.4 MG SL tablet Place 1 tablet (0.4 mg total) under the tongue every 5 (five) minutes as needed. (Patient taking differently: Place 0.4 mg under the tongue every 5 (five) minutes as needed for chest pain.) 25 tablet 6   No facility-administered medications prior to visit.    Past Medical History:  Diagnosis Date   Arthritis    Atherosclerosis of aorta (HCC) 02/02/2015   Noted on imaging    Bronchiectasis (HCC)    Bronchiectasis with (acute) exacerbation (HCC) 06/09/2015   CAD (coronary artery disease), native coronary artery    Coronary calcification noted on CT scan 2016  Negative treadmill myoview 07/20/15 with EF 76%    Cancer (HCC)    Cellulitis of left breast 10/01/2018   Colon polyp    COPD, moderate (HCC) 11/06/2015   Coronary artery calcification seen on CT scan    Coronary calcification noted on CT scan 2016  Negative treadmill myoview 07/20/15 with EF 76%    COVID 2020   had the infusion   Depressive disorder, not elsewhere classified 12/04/2011   Dyspnea    Emphysema of lung (HCC) 02/02/2015   Family history of lung cancer    Family history of ovarian cancer    Family history of prostate cancer     Genetic testing 03/27/2018   The Common Hereditary Cancer Panel offered by [facility] includes sequencing and/or deletion duplication testing of the following 47 genes: APC, ATM, AXIN2, BARD1, BMPR1A, BRCA1, BRCA2, BRIP1, CDH1, CDKN2A (p14ARF), CDKN2A (p16INK4a), CKD4, CHEK2, CTNNA1, DICER1, EPCAM (Deletion/duplication testing only), GREM1 (promoter region deletion/duplication testing only), KIT, MEN1, MLH1, MSH2, MSH3, MSH6, MU   GERD (gastroesophageal reflux disease)    Heart murmur    as a child after rheumatic fever, has not been heard since   Hiatal hernia 02/02/2015   small, noted on CT of chest    History of radiation therapy 04/16/18- 05/14/18  42.56 Gy directed to the Left Breast in 16 fractions followed by a boost of 8 Gy in 4 fractions   History of smoking 25-50 pack years 06/09/2015   Malignant neoplasm of lower-inner quadrant of left breast in female, 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   Osteopenia 02/18/2018   DEXA 02/2018, T-1.8 R femoral neck   Personal history of radiation therapy 2019   Pleuritic pain 10/01/2018   Pneumonia    Shingles 1/09, 07/2010, 11/2017   Unspecified vitamin D deficiency 02/2011 and 2016   Vitamin D deficiency 02/14/2011      Objective:     BP (!) 140/72 (BP Location: Right Arm, Patient Position: Sitting, Cuff Size: Normal)   Pulse (!) 104   Temp 98.8 F (37.1 C) (Oral)   Ht 5\' 9"  (1.753 m)   Wt 137 lb (62.1 kg)   SpO2 93%   BMI 20.23 kg/m   SpO2: 93 % RA  Amb wf harsh hacking cough and obvious pain with coughing fits   HEENT : Oropharynx  clear       NECK :  without  apparent JVD/ palpable Nodes/TM    LUNGS: no acc muscle use,  Nl contour chest  with isnp/ exp rhonchi R>L    CV:  RRR  no s3 or murmur or increase in P2, and no edema   ABD:  soft and nontender with nl inspiratory excursion in the supine position. No bruits or organomegaly appreciated   MS:  Nl gait/ ext warm without deformities Or obvious joint restrictions   calf tenderness, cyanosis or clubbing    SKIN: warm and dry without lesions    NEURO:  alert, approp, nl sensorium with  no motor or cerebellar deficits apparent.     CXR PA and Lateral:   07/02/2023 :    I personally reviewed images and impression is as follows:     C/w bronchiectasis/no new as dz, no ptx or effusions     Assessment   Bronchiectasis with (acute) exacerbation (HCC) Flare since 06/26/23  rx cipro x 06/28/23 > no better 07/02/2023 rx add augmentin/ flutter/ mucinex dm /tramadol and seek VEST approval  The pattern of cough with purulent sputum is recurrent/ stereo typical and classic for bronchiectasis but if not responding to above measures rec eval in ER          Each maintenance medication was reviewed in detail including emphasizing most importantly the difference between maintenance and prns and under what circumstances the prns are to be triggered using an action plan format where appropriate.  Total time for H and P, chart review, counseling, reviewing hfa/neb/flutter  device(s) and generating customized AVS unique to this office visit / same day charting > 30 mi pt new to me          Sandrea Hughs, MD 07/02/2023

## 2023-07-02 NOTE — Assessment & Plan Note (Addendum)
Flare since 06/26/23  rx cipro x 06/28/23 > no better 07/02/2023 rx add augmentin/ flutter/ mucinex dm /tramadol and seek VEST approval  The pattern of cough with purulent sputum is recurrent/ stereo typical and classic for bronchiectasis but if not responding to above measures rec eval in ER          Each maintenance medication was reviewed in detail including emphasizing most importantly the difference between maintenance and prns and under what circumstances the prns are to be triggered using an action plan format where appropriate.  Total time for H and P, chart review, counseling, reviewing hfa/neb/flutter  device(s) and generating customized AVS unique to this office visit / same day charting > 30 mi pt new to me

## 2023-07-02 NOTE — Patient Instructions (Signed)
For cough/ congestion >   mucinex dm  up to maximum of  1200 mg every 12 hours and use the flutter valve as much as you can    If still can't stop coughing, then add tramadol 50  1-2 every 4 hours as needed  Start today Augmentin 875 mg take one pill twice daily  X 10 days - take at breakfast and supper with large glass of water.  It would help reduce the usual side effects (diarrhea and yeast infections) if you ate cultured yogurt at lunch.   Finish Cipro also  Please remember to go to the  x-ray department  for your tests - we will call you with the results when they are available    Call if not better by July 22 call for follow up  - if worse go to ER

## 2023-07-04 ENCOUNTER — Telehealth: Payer: Self-pay | Admitting: Internal Medicine

## 2023-07-04 NOTE — Telephone Encounter (Signed)
PT calling on the results for xrays done on 7/17. She states that she has called before but I do not see an open encounter. Pls call back @ [phone]

## 2023-07-08 DIAGNOSIS — J479 Bronchiectasis, uncomplicated: Secondary | ICD-10-CM | POA: Diagnosis not present

## 2023-07-09 NOTE — Telephone Encounter (Signed)
Cxr not final yet - I'll call radiology and see what the hold up is  What is a TE?

## 2023-07-09 NOTE — Telephone Encounter (Signed)
No result note attached to CXR or in TEs.  Please advise, thanks!

## 2023-07-10 NOTE — Telephone Encounter (Signed)
TE possibly Therapeutic Equivalence.  LM for PT with Dr's comments.

## 2023-08-07 ENCOUNTER — Ambulatory Visit: Payer: Medicare Other | Admitting: Internal Medicine

## 2023-08-07 NOTE — Progress Notes (Deleted)
she is not taking any treatment.  She is agreed to  take Prilosec and will follow-up with primary care physician.   OV 01/17/2020  Subjective:  Patient ID: Molly Ross, female , DOB: [birth year] , age 82 y.o. , MRN: [id number] , ADDRESS: [address] 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  Not taking 3% saline nebulizer  Normal stress Myoview in August 2016  Background history of breast cancer followed by Dr. Pamelia Hoit   HPI Molly Ross 82 y.o. -returns for follow-up.  Last seen in January 2020.  After that because of the pandemic 2019 with Covid she has not returned for follow-up.  She says she has been followed from her job.  Without the external exposures in the environment she has been social distancing and isolating.  Therefore because of this her respiratory symptoms are better.  Her sputum production has improved a lot.  She certainly had 1 exacerbation.  Sputum volume is less cough is much less.  She is only on [place].  She decided not to do the 3% saline nebulizer because she feels she does not need it.  She barely brings out any sputum.  She has had a first dose COVID-19 vaccine Moderna.  She will have the second dose soon    Last pulmonary function test November 2016 with FEV1 postbronchodilator 1.36/51% and DLCO 16/51  Last CT scan of the chest January 2020   ROS - per HPI  OV 10/26/2021  Subjective:  Patient ID: Molly Ross, female , DOB: [birth year] , age 60 y.o. , MRN: [id number] , ADDRESS: [address] [place] [address] PCP Joselyn Arrow, MD Patient  Care Team: Joselyn Arrow, MD as PCP - General (Family Medicine) Almond Lint, MD as Consulting Physician (General Surgery) Serena Croissant, MD as Consulting Physician (Hematology and Oncology) Lonie Peak, MD as Attending Physician (Radiation Oncology) Loa Socks, NP as Nurse Practitioner (Hematology and Oncology) Othella Boyer, MD as Consulting Physician (Cardiology)  This Provider for this visit: Treatment Team:  Attending Provider: Kalman Shan, MD    10/26/2021 -   Chief Complaint  Patient presents with   Follow-up    Pt states she has been doing good since last visit. States she has had one flare up since last visit but has been doing okay.   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  Not taking 3% saline nebulizer  Normal stress Myoview in August 2016  Background history of breast cancer followed by Dr. Pamelia Hoit  HPI Molly Ross 82 y.o. -follow-up bronchiectasis.  Personally not since early 2021.  She was supposed to establish care with Dr. Chestine Spore on 04 colleagues who specialize in bronchiectasis.  But Dr. Chestine Spore is now doing only critical care medicine.  Therefore she is back to see me.  In August 2022 she  OV 10/08/2022  Subjective:  Patient ID: Molly Ross, female , DOB: February 16, 1941 , age 64 y.o. , MRN: [id number] , ADDRESS: [address] [place] [address] PCP Joselyn Arrow, MD Patient Care Team: Joselyn Arrow, MD as PCP - General (Family Medicine) Almond Lint, MD as Consulting Physician (General Surgery) Serena Croissant, MD as Consulting Physician (Hematology and Oncology) Lonie Peak, MD as Attending Physician (Radiation Oncology) Axel Filler, Larna Daughters, NP as Nurse Practitioner (Hematology and Oncology) Kalman Shan, MD as Consulting Physician (Pulmonary Disease) Baldo Daub, MD as Consulting Physician (Cardiology) Kathi Der, MD as Consulting Physician (Gastroenterology) Carman Ching, OD (Optometry) Genia Del, Daisy Blossom, MD as Consulting Physician (Ophthalmology) Joette Catching, DMD (Dentistry)  This Provider for this visit: Treatment Team:  Attending Provider: Coralyn Helling, MD    10/08/2022 -   Chief Complaint  Patient presents with   Acute Visit    PT states she has pain in left lung  (started this AM)    follow bronchiectasis [idiopathic work-up February 2017 with negative serology and negative immunoglobulin] 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  Not taking 3% saline nebulizer  Normal stress Myoview in August 2016  Background history of breast cancer followed by Dr. Pamelia Hoit   HPI Molly Ross 82 y.o. -acute visit.  She tells me that between September 17, 2022 and September 30, 2022 she was in [place] because a friend passed away and was a Engineer, building services.  She was in a tent with a lot of people.  Then when she came back she started having some green sputum and right-sided lung pain.  She called [place] and we prescribed cephalexin and prednisone which she is on currently.  Those symptoms are improving but this morning she woke up with abrupt pleuritic pain on her left lung is excruciating.  Denies any injuries.  Describes classic symptoms of pleurisy.  It is in the left upper lobe area.  But she is pointing to the precordial area.  There is no hemoptysis.  No rib fractures.  She is not in respiratory distress.      OV 08/07/2023  Subjective:  Patient ID: Molly Ross, female , DOB: [birth year] , age 62 y.o. , MRN: [id number] , ADDRESS: [address] [place] [address] PCP Joselyn Arrow, MD Patient Care Team: Joselyn Arrow, MD as PCP - General (Family Medicine) Almond Lint, MD as Consulting Physician (General Surgery) Serena Croissant, MD as Consulting Physician (Hematology and Oncology) Lonie Peak, MD as Attending Physician (Radiation Oncology) Axel Filler, Larna Daughters, NP as Nurse Practitioner (Hematology and Oncology) Kalman Shan, MD as Consulting Physician (Pulmonary Disease) Baldo Daub, MD as Consulting Physician (Cardiology) Kathi Der, MD as Consulting Physician  (Gastroenterology) Carman Ching, OD (Optometry) Genia Del, Daisy Blossom, MD as Consulting Physician (Ophthalmology) Joette Catching, DMD (Dentistry)  This Provider for this visit: Treatment Team:  Attending Provider: Kalman Shan, MD    08/07/2023 -  No chief complaint on file.    HPI Molly Ross 82 y.o. 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  She is agreed to  take Prilosec and will follow-up with primary care physician.   OV 01/17/2020  Subjective:  Patient ID: Molly Ross, female , DOB: [birth year] , age 82 y.o. , MRN: [id number] , ADDRESS: [address] 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  Not taking 3% saline nebulizer  Normal stress Myoview in August 2016  Background history of breast cancer followed by Dr. Pamelia Hoit   HPI Molly Ross 82 y.o. -returns for follow-up.  Last seen in January 2020.  After that because of the pandemic 2019 with Covid she has not returned for follow-up.  She says she has been followed from her job.  Without the external exposures in the environment she has been social distancing and isolating.  Therefore because of this her respiratory symptoms are better.  Her sputum production has improved a lot.  She certainly had 1 exacerbation.  Sputum volume is less cough is much less.  She is only on [place].  She decided not to do the 3% saline nebulizer because she feels she does not need it.  She barely brings out any sputum.  She has had a first dose COVID-19 vaccine Moderna.  She will have the second dose soon    Last pulmonary function test November 2016 with FEV1 postbronchodilator 1.36/51% and DLCO 16/51  Last CT scan of the chest January 2020   ROS - per HPI  OV 10/26/2021  Subjective:  Patient ID: Molly Ross, female , DOB: [birth year] , age 60 y.o. , MRN: [id number] , ADDRESS: [address] [place] [address] PCP Joselyn Arrow, MD Patient  Care Team: Joselyn Arrow, MD as PCP - General (Family Medicine) Almond Lint, MD as Consulting Physician (General Surgery) Serena Croissant, MD as Consulting Physician (Hematology and Oncology) Lonie Peak, MD as Attending Physician (Radiation Oncology) Loa Socks, NP as Nurse Practitioner (Hematology and Oncology) Othella Boyer, MD as Consulting Physician (Cardiology)  This Provider for this visit: Treatment Team:  Attending Provider: Kalman Shan, MD    10/26/2021 -   Chief Complaint  Patient presents with   Follow-up    Pt states she has been doing good since last visit. States she has had one flare up since last visit but has been doing okay.   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  Not taking 3% saline nebulizer  Normal stress Myoview in August 2016  Background history of breast cancer followed by Dr. Pamelia Hoit  HPI Molly Ross 82 y.o. -follow-up bronchiectasis.  Personally not since early 2021.  She was supposed to establish care with Dr. Chestine Spore on 04 colleagues who specialize in bronchiectasis.  But Dr. Chestine Spore is now doing only critical care medicine.  Therefore she is back to see me.  In August 2022 she  she is not taking any treatment.  She is agreed to  take Prilosec and will follow-up with primary care physician.   OV 01/17/2020  Subjective:  Patient ID: Molly Ross, female , DOB: [birth year] , age 82 y.o. , MRN: [id number] , ADDRESS: [address] 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  Not taking 3% saline nebulizer  Normal stress Myoview in August 2016  Background history of breast cancer followed by Dr. Pamelia Hoit   HPI Molly Ross 82 y.o. -returns for follow-up.  Last seen in January 2020.  After that because of the pandemic 2019 with Covid she has not returned for follow-up.  She says she has been followed from her job.  Without the external exposures in the environment she has been social distancing and isolating.  Therefore because of this her respiratory symptoms are better.  Her sputum production has improved a lot.  She certainly had 1 exacerbation.  Sputum volume is less cough is much less.  She is only on [place].  She decided not to do the 3% saline nebulizer because she feels she does not need it.  She barely brings out any sputum.  She has had a first dose COVID-19 vaccine Moderna.  She will have the second dose soon    Last pulmonary function test November 2016 with FEV1 postbronchodilator 1.36/51% and DLCO 16/51  Last CT scan of the chest January 2020   ROS - per HPI  OV 10/26/2021  Subjective:  Patient ID: Molly Ross, female , DOB: [birth year] , age 60 y.o. , MRN: [id number] , ADDRESS: [address] [place] [address] PCP Joselyn Arrow, MD Patient  Care Team: Joselyn Arrow, MD as PCP - General (Family Medicine) Almond Lint, MD as Consulting Physician (General Surgery) Serena Croissant, MD as Consulting Physician (Hematology and Oncology) Lonie Peak, MD as Attending Physician (Radiation Oncology) Loa Socks, NP as Nurse Practitioner (Hematology and Oncology) Othella Boyer, MD as Consulting Physician (Cardiology)  This Provider for this visit: Treatment Team:  Attending Provider: Kalman Shan, MD    10/26/2021 -   Chief Complaint  Patient presents with   Follow-up    Pt states she has been doing good since last visit. States she has had one flare up since last visit but has been doing okay.   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  Not taking 3% saline nebulizer  Normal stress Myoview in August 2016  Background history of breast cancer followed by Dr. Pamelia Hoit  HPI Molly Ross 82 y.o. -follow-up bronchiectasis.  Personally not since early 2021.  She was supposed to establish care with Dr. Chestine Spore on 04 colleagues who specialize in bronchiectasis.  But Dr. Chestine Spore is now doing only critical care medicine.  Therefore she is back to see me.  In August 2022 she  OV 10/08/2022  Subjective:  Patient ID: Molly Ross, female , DOB: February 16, 1941 , age 64 y.o. , MRN: [id number] , ADDRESS: [address] [place] [address] PCP Joselyn Arrow, MD Patient Care Team: Joselyn Arrow, MD as PCP - General (Family Medicine) Almond Lint, MD as Consulting Physician (General Surgery) Serena Croissant, MD as Consulting Physician (Hematology and Oncology) Lonie Peak, MD as Attending Physician (Radiation Oncology) Axel Filler, Larna Daughters, NP as Nurse Practitioner (Hematology and Oncology) Kalman Shan, MD as Consulting Physician (Pulmonary Disease) Baldo Daub, MD as Consulting Physician (Cardiology) Kathi Der, MD as Consulting Physician (Gastroenterology) Carman Ching, OD (Optometry) Genia Del, Daisy Blossom, MD as Consulting Physician (Ophthalmology) Joette Catching, DMD (Dentistry)  This Provider for this visit: Treatment Team:  Attending Provider: Coralyn Helling, MD    10/08/2022 -   Chief Complaint  Patient presents with   Acute Visit    PT states she has pain in left lung  (started this AM)    follow bronchiectasis [idiopathic work-up February 2017 with negative serology and negative immunoglobulin] 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  Not taking 3% saline nebulizer  Normal stress Myoview in August 2016  Background history of breast cancer followed by Dr. Pamelia Hoit   HPI Molly Ross 82 y.o. -acute visit.  She tells me that between September 17, 2022 and September 30, 2022 she was in [place] because a friend passed away and was a Engineer, building services.  She was in a tent with a lot of people.  Then when she came back she started having some green sputum and right-sided lung pain.  She called [place] and we prescribed cephalexin and prednisone which she is on currently.  Those symptoms are improving but this morning she woke up with abrupt pleuritic pain on her left lung is excruciating.  Denies any injuries.  Describes classic symptoms of pleurisy.  It is in the left upper lobe area.  But she is pointing to the precordial area.  There is no hemoptysis.  No rib fractures.  She is not in respiratory distress.      OV 08/07/2023  Subjective:  Patient ID: Molly Ross, female , DOB: [birth year] , age 62 y.o. , MRN: [id number] , ADDRESS: [address] [place] [address] PCP Joselyn Arrow, MD Patient Care Team: Joselyn Arrow, MD as PCP - General (Family Medicine) Almond Lint, MD as Consulting Physician (General Surgery) Serena Croissant, MD as Consulting Physician (Hematology and Oncology) Lonie Peak, MD as Attending Physician (Radiation Oncology) Axel Filler, Larna Daughters, NP as Nurse Practitioner (Hematology and Oncology) Kalman Shan, MD as Consulting Physician (Pulmonary Disease) Baldo Daub, MD as Consulting Physician (Cardiology) Kathi Der, MD as Consulting Physician  (Gastroenterology) Carman Ching, OD (Optometry) Genia Del, Daisy Blossom, MD as Consulting Physician (Ophthalmology) Joette Catching, DMD (Dentistry)  This Provider for this visit: Treatment Team:  Attending Provider: Kalman Shan, MD    08/07/2023 -  No chief complaint on file.    HPI Molly Ross 82 y.o. 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 She is up-to-date with flu shot. She continues on Spiriva and Brio triple inhaler therapy. There are no new issues.   OV 04/24/2017  Chief Complaint  Patient presents with   Acute Visit    Pt c/o hoarseness, prod cough with green mucus, chest tightness x 2 days. Pt denies f/c/s and increase in SOB.   Acute visit for this patient with idiopathic bronchiectasis.  December 2017 she saw Dr. Levy Pupa with an acute exacerbation. After that she went back to baseline. She does not use chest physical therapy but she uses inhaler therapy only. Now for the last 2 days she's having increased cough with chest tightness and green mucous. Baseline is clear yellow mucus. Low-grade fever present in the office. No wheezing or increased shortness of breath. Significant hoarseness of voice present and some postnasal drip present. She feels she is in bronchiectasis exacerbation. There are no other issues. It is noted that in July  2017 bronchoscopy did not reveal any organisms on lavage    03/17/2018 Follow up : Bronchiectasis /COPD  Returns for a follow-up for bronchiectasis/COPD .  Says that she has been doing well over the last year.  She had no increased cough or congestion.  No recent antibiotic use.  She denies any shortness of breath, hemoptysis, weight loss, edema.  She does have an occasional cough that is minimally productive.  She is very active and independent.  She remains on BREO daily .,  Does depend on samples.  Has not taken Spiriva in >1 year. Has not felt that she needed this.  Recently diagnosed left breast cancer. Plans for surgery later this week.    OV 09/02/2018  Subjective:  Patient ID: Molly Ross, female , DOB: 1941/04/09 , age 18 y.o. , MRN: [id number] , ADDRESS: [address] [place] [address]   09/02/2018 -   Chief Complaint  Patient presents with   Follow-up    Pt states she has been doing good since last visit except she does have complaints of fatigue and is coughing with occ green to yellow mucus. States cough is worse in the mornings.   82 year old female former smoker followed for COPD and Bronchiectasis. 10/30/2015) baseline and well. Post broncho-dilator FEV1 1.36 L/51% and a ratio 60, total lung capacity 5.2 L/90%, DLCO 16/51%. Last CT 2016 and May 2017 and CXR aug 2019 with chronic changes    HPI Molly Ross 82 y.o. - female with bronchiectasis maintained on Brio. I personally have not seen her since May 2018. Since then she has seen my colleagues for flareupsand gotten anabiotic's of prednisone. Most recently in August 2019. Chest x-ray that time showed just chronic changes. Since that time overall she's feeling well. She is stable. She has sputum production every few days. She uses a flutter valve as needed only. Sometimes of sputum is thick. But overall stable no change in color. Does not feel she is in a flareup.She continuesl and the fluas needed. She is  not on Mucinex. She wants to have the flu shot today.he does been a few years since she had pulmonary function test.   She takes care of her sister in a wheelchair. Another sister died from ovarian cancer.   ROS - per HPI   OV 11/19/2018  Subjective:  Patient ID: Molly Ross, female , DOB: 1941/07/07 , age 23 y.o. , MRN: [id number] , ADDRESS: [address] [place] [address]  had COVID-19.  After that she had 1 flareup of bronchiectasis.  Overall however since the pandemic started she has been masking and social distancing and taking Spiriva and Breo.  This is provided excellent symptom control.  She does not have much symptoms.  Not much sputum.  She continues flutter valve.  She is not on oxygen.  She is not on vibratory vest.  She is not on Roflumilast.  She is not on 3% saline nebulizer.  Sputum production is not much.   She agreed to have the flu shot today  but she declined COVID-vaccine.  She had natural COVID in August 2022.    03/14/2022 Follow up : Bronchiectasis  and COPD  Returns for 4 month follow up . Says was doing better until 4 weeks ago , she fell at home with right sided rib fractures. Went to ER , Chest xray showed minimally displaced right 9/10 rib fractures. Says very sore , sleeping in recliner. No longer taking pain meds. Cough was increased until this week. Worried that she still has pain.Bruising has resolved but still very sore in right ribs and back.   Remains on Stiolto  daily . Uses flutter valve but not able to last few weeks. Not using nebs currently . Has started incentive spirometry . Had Covid 19 infection in January 2023 . Improved with Paxlovid. Cough is at baseline .No fever, hemoptysis , weight loss.  Appetite is fair.        OV 07/05/2022  Subjective:  Patient ID: Molly Ross, female , DOB: [birth year] , age 44 y.o. , MRN: [id number] , ADDRESS: [address] [place] [address] PCP Joselyn Arrow, MD Patient Care Team: Joselyn Arrow, MD as PCP - General (Family Medicine) Almond Lint, MD as Consulting Physician (General Surgery) Serena Croissant, MD as Consulting Physician (Hematology and Oncology) Lonie Peak, MD as Attending Physician (Radiation Oncology) Axel Filler Larna Daughters, NP as Nurse Practitioner (Hematology and Oncology) Kalman Shan, MD as Consulting Physician (Pulmonary Disease) Baldo Daub, MD as Consulting Physician (Cardiology) Kathi Der, MD as Consulting Physician (Gastroenterology) Carman Ching, OD (Optometry) Genia Del, Daisy Blossom, MD as Consulting Physician (Ophthalmology) Joette Catching, DMD (Dentistry)  This Provider for this visit: Treatment Team:  Attending Provider: Kalman Shan, MD    07/05/2022 -   Chief Complaint  Patient presents with   Follow-up    Follow-up    HPI Molly Ross 82 y.o. -returns for follow-up.  She is  doing well.  In between she is seen the nurse practitioner.  In the interim her sister passed away.  I used to know her sister.  Her sister went to SNF 3 years ago and then recently had aspiration and passed away.  She says in 03-19-2023she fell down and fractured her ribs and after that was in the recliner.  Being in the recliner helped her because she avoided going outside.  Currently working at [employer] and able to do it.  She does get tired a little bit.  She is on Stiolto and she likes it.  She is asking for samples but we only had Anoro samples.  She is not that compliant with the flutter valve because she feels she does not need it.  Overall stable she had pulmonary function test which I believe shows stability/slight age-related decline I went over the results with her and visualized the image myself    CT Chest data  No results found.  she is not taking any treatment.  She is agreed to  take Prilosec and will follow-up with primary care physician.   OV 01/17/2020  Subjective:  Patient ID: Molly Ross, female , DOB: [birth year] , age 82 y.o. , MRN: [id number] , ADDRESS: [address] 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  Not taking 3% saline nebulizer  Normal stress Myoview in August 2016  Background history of breast cancer followed by Dr. Pamelia Hoit   HPI Molly Ross 82 y.o. -returns for follow-up.  Last seen in January 2020.  After that because of the pandemic 2019 with Covid she has not returned for follow-up.  She says she has been followed from her job.  Without the external exposures in the environment she has been social distancing and isolating.  Therefore because of this her respiratory symptoms are better.  Her sputum production has improved a lot.  She certainly had 1 exacerbation.  Sputum volume is less cough is much less.  She is only on [place].  She decided not to do the 3% saline nebulizer because she feels she does not need it.  She barely brings out any sputum.  She has had a first dose COVID-19 vaccine Moderna.  She will have the second dose soon    Last pulmonary function test November 2016 with FEV1 postbronchodilator 1.36/51% and DLCO 16/51  Last CT scan of the chest January 2020   ROS - per HPI  OV 10/26/2021  Subjective:  Patient ID: Molly Ross, female , DOB: [birth year] , age 60 y.o. , MRN: [id number] , ADDRESS: [address] [place] [address] PCP Joselyn Arrow, MD Patient  Care Team: Joselyn Arrow, MD as PCP - General (Family Medicine) Almond Lint, MD as Consulting Physician (General Surgery) Serena Croissant, MD as Consulting Physician (Hematology and Oncology) Lonie Peak, MD as Attending Physician (Radiation Oncology) Loa Socks, NP as Nurse Practitioner (Hematology and Oncology) Othella Boyer, MD as Consulting Physician (Cardiology)  This Provider for this visit: Treatment Team:  Attending Provider: Kalman Shan, MD    10/26/2021 -   Chief Complaint  Patient presents with   Follow-up    Pt states she has been doing good since last visit. States she has had one flare up since last visit but has been doing okay.   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  Not taking 3% saline nebulizer  Normal stress Myoview in August 2016  Background history of breast cancer followed by Dr. Pamelia Hoit  HPI Molly Ross 82 y.o. -follow-up bronchiectasis.  Personally not since early 2021.  She was supposed to establish care with Dr. Chestine Spore on 04 colleagues who specialize in bronchiectasis.  But Dr. Chestine Spore is now doing only critical care medicine.  Therefore she is back to see me.  In August 2022 she  OV 10/08/2022  Subjective:  Patient ID: Molly Ross, female , DOB: February 16, 1941 , age 64 y.o. , MRN: [id number] , ADDRESS: [address] [place] [address] PCP Joselyn Arrow, MD Patient Care Team: Joselyn Arrow, MD as PCP - General (Family Medicine) Almond Lint, MD as Consulting Physician (General Surgery) Serena Croissant, MD as Consulting Physician (Hematology and Oncology) Lonie Peak, MD as Attending Physician (Radiation Oncology) Axel Filler, Larna Daughters, NP as Nurse Practitioner (Hematology and Oncology) Kalman Shan, MD as Consulting Physician (Pulmonary Disease) Baldo Daub, MD as Consulting Physician (Cardiology) Kathi Der, MD as Consulting Physician (Gastroenterology) Carman Ching, OD (Optometry) Genia Del, Daisy Blossom, MD as Consulting Physician (Ophthalmology) Joette Catching, DMD (Dentistry)  This Provider for this visit: Treatment Team:  Attending Provider: Coralyn Helling, MD    10/08/2022 -   Chief Complaint  Patient presents with   Acute Visit    PT states she has pain in left lung  (started this AM)    follow bronchiectasis [idiopathic work-up February 2017 with negative serology and negative immunoglobulin] 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  Not taking 3% saline nebulizer  Normal stress Myoview in August 2016  Background history of breast cancer followed by Dr. Pamelia Hoit   HPI Molly Ross 82 y.o. -acute visit.  She tells me that between September 17, 2022 and September 30, 2022 she was in [place] because a friend passed away and was a Engineer, building services.  She was in a tent with a lot of people.  Then when she came back she started having some green sputum and right-sided lung pain.  She called [place] and we prescribed cephalexin and prednisone which she is on currently.  Those symptoms are improving but this morning she woke up with abrupt pleuritic pain on her left lung is excruciating.  Denies any injuries.  Describes classic symptoms of pleurisy.  It is in the left upper lobe area.  But she is pointing to the precordial area.  There is no hemoptysis.  No rib fractures.  She is not in respiratory distress.      OV 08/07/2023  Subjective:  Patient ID: Molly Ross, female , DOB: [birth year] , age 62 y.o. , MRN: [id number] , ADDRESS: [address] [place] [address] PCP Joselyn Arrow, MD Patient Care Team: Joselyn Arrow, MD as PCP - General (Family Medicine) Almond Lint, MD as Consulting Physician (General Surgery) Serena Croissant, MD as Consulting Physician (Hematology and Oncology) Lonie Peak, MD as Attending Physician (Radiation Oncology) Axel Filler, Larna Daughters, NP as Nurse Practitioner (Hematology and Oncology) Kalman Shan, MD as Consulting Physician (Pulmonary Disease) Baldo Daub, MD as Consulting Physician (Cardiology) Kathi Der, MD as Consulting Physician  (Gastroenterology) Carman Ching, OD (Optometry) Genia Del, Daisy Blossom, MD as Consulting Physician (Ophthalmology) Joette Catching, DMD (Dentistry)  This Provider for this visit: Treatment Team:  Attending Provider: Kalman Shan, MD    08/07/2023 -  No chief complaint on file.    HPI Molly Ross 82 y.o. -    CT Chest data from date: ****  - personally visualized and independently interpreted : *** - my findings are: ***   PFT     Latest Ref Rng & Units 07/05/2022    9:42 AM 10/30/2015    4:38 PM  ILD indicators  FVC-Pre L 2.03  had COVID-19.  After that she had 1 flareup of bronchiectasis.  Overall however since the pandemic started she has been masking and social distancing and taking Spiriva and Breo.  This is provided excellent symptom control.  She does not have much symptoms.  Not much sputum.  She continues flutter valve.  She is not on oxygen.  She is not on vibratory vest.  She is not on Roflumilast.  She is not on 3% saline nebulizer.  Sputum production is not much.   She agreed to have the flu shot today  but she declined COVID-vaccine.  She had natural COVID in August 2022.    03/14/2022 Follow up : Bronchiectasis  and COPD  Returns for 4 month follow up . Says was doing better until 4 weeks ago , she fell at home with right sided rib fractures. Went to ER , Chest xray showed minimally displaced right 9/10 rib fractures. Says very sore , sleeping in recliner. No longer taking pain meds. Cough was increased until this week. Worried that she still has pain.Bruising has resolved but still very sore in right ribs and back.   Remains on Stiolto  daily . Uses flutter valve but not able to last few weeks. Not using nebs currently . Has started incentive spirometry . Had Covid 19 infection in January 2023 . Improved with Paxlovid. Cough is at baseline .No fever, hemoptysis , weight loss.  Appetite is fair.        OV 07/05/2022  Subjective:  Patient ID: Molly Ross, female , DOB: [birth year] , age 44 y.o. , MRN: [id number] , ADDRESS: [address] [place] [address] PCP Joselyn Arrow, MD Patient Care Team: Joselyn Arrow, MD as PCP - General (Family Medicine) Almond Lint, MD as Consulting Physician (General Surgery) Serena Croissant, MD as Consulting Physician (Hematology and Oncology) Lonie Peak, MD as Attending Physician (Radiation Oncology) Axel Filler Larna Daughters, NP as Nurse Practitioner (Hematology and Oncology) Kalman Shan, MD as Consulting Physician (Pulmonary Disease) Baldo Daub, MD as Consulting Physician (Cardiology) Kathi Der, MD as Consulting Physician (Gastroenterology) Carman Ching, OD (Optometry) Genia Del, Daisy Blossom, MD as Consulting Physician (Ophthalmology) Joette Catching, DMD (Dentistry)  This Provider for this visit: Treatment Team:  Attending Provider: Kalman Shan, MD    07/05/2022 -   Chief Complaint  Patient presents with   Follow-up    Follow-up    HPI Molly Ross 82 y.o. -returns for follow-up.  She is  doing well.  In between she is seen the nurse practitioner.  In the interim her sister passed away.  I used to know her sister.  Her sister went to SNF 3 years ago and then recently had aspiration and passed away.  She says in 03-19-2023she fell down and fractured her ribs and after that was in the recliner.  Being in the recliner helped her because she avoided going outside.  Currently working at [employer] and able to do it.  She does get tired a little bit.  She is on Stiolto and she likes it.  She is asking for samples but we only had Anoro samples.  She is not that compliant with the flutter valve because she feels she does not need it.  Overall stable she had pulmonary function test which I believe shows stability/slight age-related decline I went over the results with her and visualized the image myself    CT Chest data  No results found.  she is not taking any treatment.  She is agreed to  take Prilosec and will follow-up with primary care physician.   OV 01/17/2020  Subjective:  Patient ID: Molly Ross, female , DOB: [birth year] , age 82 y.o. , MRN: [id number] , ADDRESS: [address] 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  Not taking 3% saline nebulizer  Normal stress Myoview in August 2016  Background history of breast cancer followed by Dr. Pamelia Hoit   HPI Molly Ross 82 y.o. -returns for follow-up.  Last seen in January 2020.  After that because of the pandemic 2019 with Covid she has not returned for follow-up.  She says she has been followed from her job.  Without the external exposures in the environment she has been social distancing and isolating.  Therefore because of this her respiratory symptoms are better.  Her sputum production has improved a lot.  She certainly had 1 exacerbation.  Sputum volume is less cough is much less.  She is only on [place].  She decided not to do the 3% saline nebulizer because she feels she does not need it.  She barely brings out any sputum.  She has had a first dose COVID-19 vaccine Moderna.  She will have the second dose soon    Last pulmonary function test November 2016 with FEV1 postbronchodilator 1.36/51% and DLCO 16/51  Last CT scan of the chest January 2020   ROS - per HPI  OV 10/26/2021  Subjective:  Patient ID: Molly Ross, female , DOB: [birth year] , age 60 y.o. , MRN: [id number] , ADDRESS: [address] [place] [address] PCP Joselyn Arrow, MD Patient  Care Team: Joselyn Arrow, MD as PCP - General (Family Medicine) Almond Lint, MD as Consulting Physician (General Surgery) Serena Croissant, MD as Consulting Physician (Hematology and Oncology) Lonie Peak, MD as Attending Physician (Radiation Oncology) Loa Socks, NP as Nurse Practitioner (Hematology and Oncology) Othella Boyer, MD as Consulting Physician (Cardiology)  This Provider for this visit: Treatment Team:  Attending Provider: Kalman Shan, MD    10/26/2021 -   Chief Complaint  Patient presents with   Follow-up    Pt states she has been doing good since last visit. States she has had one flare up since last visit but has been doing okay.   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  Not taking 3% saline nebulizer  Normal stress Myoview in August 2016  Background history of breast cancer followed by Dr. Pamelia Hoit  HPI Molly Ross 82 y.o. -follow-up bronchiectasis.  Personally not since early 2021.  She was supposed to establish care with Dr. Chestine Spore on 04 colleagues who specialize in bronchiectasis.  But Dr. Chestine Spore is now doing only critical care medicine.  Therefore she is back to see me.  In August 2022 she  OV 10/08/2022  Subjective:  Patient ID: Molly Ross, female , DOB: February 16, 1941 , age 64 y.o. , MRN: [id number] , ADDRESS: [address] [place] [address] PCP Joselyn Arrow, MD Patient Care Team: Joselyn Arrow, MD as PCP - General (Family Medicine) Almond Lint, MD as Consulting Physician (General Surgery) Serena Croissant, MD as Consulting Physician (Hematology and Oncology) Lonie Peak, MD as Attending Physician (Radiation Oncology) Axel Filler, Larna Daughters, NP as Nurse Practitioner (Hematology and Oncology) Kalman Shan, MD as Consulting Physician (Pulmonary Disease) Baldo Daub, MD as Consulting Physician (Cardiology) Kathi Der, MD as Consulting Physician (Gastroenterology) Carman Ching, OD (Optometry) Genia Del, Daisy Blossom, MD as Consulting Physician (Ophthalmology) Joette Catching, DMD (Dentistry)  This Provider for this visit: Treatment Team:  Attending Provider: Coralyn Helling, MD    10/08/2022 -   Chief Complaint  Patient presents with   Acute Visit    PT states she has pain in left lung  (started this AM)    follow bronchiectasis [idiopathic work-up February 2017 with negative serology and negative immunoglobulin] 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  Not taking 3% saline nebulizer  Normal stress Myoview in August 2016  Background history of breast cancer followed by Dr. Pamelia Hoit   HPI Molly Ross 82 y.o. -acute visit.  She tells me that between September 17, 2022 and September 30, 2022 she was in [place] because a friend passed away and was a Engineer, building services.  She was in a tent with a lot of people.  Then when she came back she started having some green sputum and right-sided lung pain.  She called [place] and we prescribed cephalexin and prednisone which she is on currently.  Those symptoms are improving but this morning she woke up with abrupt pleuritic pain on her left lung is excruciating.  Denies any injuries.  Describes classic symptoms of pleurisy.  It is in the left upper lobe area.  But she is pointing to the precordial area.  There is no hemoptysis.  No rib fractures.  She is not in respiratory distress.      OV 08/07/2023  Subjective:  Patient ID: Molly Ross, female , DOB: [birth year] , age 62 y.o. , MRN: [id number] , ADDRESS: [address] [place] [address] PCP Joselyn Arrow, MD Patient Care Team: Joselyn Arrow, MD as PCP - General (Family Medicine) Almond Lint, MD as Consulting Physician (General Surgery) Serena Croissant, MD as Consulting Physician (Hematology and Oncology) Lonie Peak, MD as Attending Physician (Radiation Oncology) Axel Filler, Larna Daughters, NP as Nurse Practitioner (Hematology and Oncology) Kalman Shan, MD as Consulting Physician (Pulmonary Disease) Baldo Daub, MD as Consulting Physician (Cardiology) Kathi Der, MD as Consulting Physician  (Gastroenterology) Carman Ching, OD (Optometry) Genia Del, Daisy Blossom, MD as Consulting Physician (Ophthalmology) Joette Catching, DMD (Dentistry)  This Provider for this visit: Treatment Team:  Attending Provider: Kalman Shan, MD    08/07/2023 -  No chief complaint on file.    HPI Molly Ross 82 y.o. -    CT Chest data from date: ****  - personally visualized and independently interpreted : *** - my findings are: ***   PFT     Latest Ref Rng & Units 07/05/2022    9:42 AM 10/30/2015    4:38 PM  ILD indicators  FVC-Pre L 2.03

## 2023-08-12 ENCOUNTER — Ambulatory Visit: Payer: Medicare Other | Admitting: Internal Medicine

## 2023-09-26 ENCOUNTER — Encounter: Payer: Self-pay | Admitting: Internal Medicine

## 2023-09-26 ENCOUNTER — Ambulatory Visit: Payer: Medicare Other | Admitting: Internal Medicine

## 2023-09-26 VITALS — BP 146/81 | HR 62 | Ht 69.0 in | Wt 137.8 lb

## 2023-09-26 DIAGNOSIS — Z7185 Encounter for immunization safety counseling: Secondary | ICD-10-CM

## 2023-09-26 DIAGNOSIS — J479 Bronchiectasis, uncomplicated: Secondary | ICD-10-CM | POA: Diagnosis not present

## 2023-09-26 MED ORDER — SODIUM CHLORIDE 3 % IN NEBU: INHALATION_SOLUTION | RESPIRATORY_TRACT | 12 refills | Status: AC | PRN

## 2023-09-26 MED ORDER — PREDNISONE 10 MG PO TABS: ORAL_TABLET | ORAL | 0 refills | Status: DC

## 2023-09-26 MED ORDER — CIPROFLOXACIN HCL 750 MG PO TABS: 750.0000 mg | ORAL_TABLET | Freq: Two times a day (BID) | ORAL | 0 refills | Status: DC

## 2023-09-26 NOTE — Progress Notes (Addendum)
OV 10/08/2022  Subjective:  Patient ID: Molly Ross, female , DOB: 1941/07/12 , age 82 y.o. , MRN: [id number] , ADDRESS: [address] [place] [address] PCP Joselyn Arrow, MD Patient Care Team: Joselyn Arrow, MD as PCP - General (Family Medicine) Almond Lint, MD as Consulting Physician (General Surgery) Serena Croissant, MD as Consulting Physician (Hematology and Oncology) Lonie Peak, MD as Attending Physician (Radiation Oncology) Axel Filler Larna Daughters, NP as Nurse Practitioner (Hematology and Oncology) Kalman Shan, MD as Consulting Physician (Pulmonary Disease) Baldo Daub, MD as Consulting Physician (Cardiology) Kathi Der, MD as Consulting Physician (Gastroenterology) Carman Ching, OD (Optometry) Genia Del, Daisy Blossom, MD as Consulting Physician (Ophthalmology) Joette Catching, DMD (Dentistry)  This Provider for this visit: Treatment Team:  Attending Provider: Coralyn Helling, MD    10/08/2022 -   Chief Complaint  Patient presents with   Acute Visit    PT states she has pain in left lung  (started this AM)      HPI Molly Ross 82 y.o. -acute visit.  She tells me that between September 17, 2022 and September 30, 2022 she was in [place] because a friend passed away and was a Engineer, building services.  She was in a tent with a lot of people.  Then when she came back she started having some green sputum and right-sided lung pain.  She called [place] and we prescribed cephalexin and prednisone which she is on currently.  Those symptoms are improving but this morning she woke up with abrupt pleuritic pain on her left lung is excruciating.  Denies any injuries.  Describes classic symptoms of pleurisy.  It is in the left upper lobe area.  But she is pointing to the precordial area.  There is no hemoptysis.  No rib fractures.  She is not in respiratory distress.  July 17/24 Acute Dr Sherene Sires  07/02/2023  Pulmonary/ Acute  eval/Wert / maint on stiolto, has mucinex and tramadol but not taking  Chief Complaint  Patient presents with   Acute Visit    Cough, wheeze, SOB and pain in chest with deep breaths x 5 days.  Dyspnea:  baseline MMRC2 = can't walk a nl pace on a flat grade s sob but does fine slow and flat eg on stiolto 2 daily but sob x 100 ft since onset of pleuritic cp/ already called in pred 06/28/23  Cough: worse than usual turing green and pleuritic cp /  ? Fever 06/28/23 rx cipro no change in sputum  Sleep: flat bed/ wedge pillow had to add another  SABA use: not needed so far 02:  none  CP recurrent pattern just like in past started with Right upper ant and then am of ov worse on L, classically pleuritic  No obvious other day to day or daytime pattern/variability or assoc  mucus plugs or hemoptysis or  chest tightness, subjective wheeze or overt sinus or hb symptoms.     Also denies any obvious fluctuation of symptoms with weather or environmental changes or other aggravating or alleviating factors except as outlined above   No unusual exposure hx or h/o childhood pna/ asthma or knowledge of  premature birth.   OV 09/26/2023  Subjective:  Patient ID: Molly Ross, female , DOB: 02/23/1941 , age 87 y.o. , MRN: [id number] , ADDRESS: [address] [place] [address] PCP Joselyn Arrow, MD Patient Care Team: Joselyn Arrow, MD as PCP - General (Family Medicine) Almond Lint, MD as Consulting Physician (General Surgery)  to do a better job with positional therapy. She is up-to-date with flu shot. She continues on Spiriva and Brio triple inhaler therapy. There are no new issues.   OV 04/24/2017  Chief Complaint  Patient presents with   Acute Visit    Pt c/o hoarseness, prod cough with green mucus, chest tightness x 2 days. Pt denies f/c/s and increase in SOB.   Acute visit for this patient with idiopathic bronchiectasis.  December 2017 she saw Dr. Levy Pupa with an acute exacerbation. After that she went back to baseline. She does not use chest physical therapy but she uses inhaler therapy only. Now for the last 2 days she's having increased cough with chest tightness and green mucous. Baseline is clear yellow mucus. Low-grade fever present in the office. No wheezing or increased shortness of breath. Significant hoarseness of voice present and some postnasal drip present. She feels she is in bronchiectasis exacerbation. There are no other issues. It is noted that in July  2017 bronchoscopy did not reveal any organisms on lavage    03/17/2018 Follow up : Bronchiectasis /COPD  Returns for a follow-up for bronchiectasis/COPD .  Says that she has been doing well over the last year.  She had no increased cough or congestion.  No recent antibiotic use.  She denies any shortness of breath, hemoptysis, weight loss, edema.  She does have an occasional cough that is minimally productive.  She is very active and independent.  She remains on BREO daily .,  Does depend on samples.  Has not taken Spiriva in >1 year. Has not felt that she needed this.  Recently diagnosed left breast cancer. Plans for surgery later this week.    OV 09/02/2018  Subjective:  Patient ID: Molly Ross, female , DOB: [birth year] , age 65 y.o. , MRN: [id number] , ADDRESS: [address] [place] [address]   09/02/2018 -   Chief Complaint  Patient presents with   Follow-up    Pt states she has been doing good since last visit except she does have complaints of fatigue and is coughing with occ green to yellow mucus. States cough is worse in the mornings.   82 year old female former smoker followed for COPD and Bronchiectasis. 10/30/2015) baseline and well. Post broncho-dilator FEV1 1.36 L/51% and a ratio 60, total lung capacity 5.2 L/90%, DLCO 16/51%. Last CT 2016 and May 2017 and CXR aug 2019 with chronic changes    HPI Molly Ross 82 y.o. - female with bronchiectasis maintained on Brio. I personally have not seen her since May 2018. Since then she has seen my colleagues for flareupsand gotten anabiotic's of prednisone. Most recently in August 2019. Chest x-ray that time showed just chronic changes. Since that time overall she's feeling well. She is stable. She has sputum production every few days. She uses a flutter valve as needed only. Sometimes of sputum is thick. But overall stable no change in color. Does not feel she is in a flareup.She continuesl and the fluas needed. She is  not on Mucinex. She wants to have the flu shot today.he does been a few years since she had pulmonary function test.   She takes care of her sister in a wheelchair. Another sister died from ovarian cancer.   ROS - per HPI   OV 11/19/2018  Subjective:  Patient ID: Molly Ross, female , DOB: 1941/10/20 , age 28 y.o. , MRN: [id number] , ADDRESS: [address] [place] [address]  11/19/2018 -   Chief Complaint  Patient presents with   Follow-up    f/u bronchiectasis, still coughing up green mucus,      HPI Molly Ross 82 y.o. -follow bronchiectasis associated with COPD colonized with Haemophilus influenza as determined by bronchoscopy in June 2018.  Maintained on Breo and flutter valve  Last seen mid September 2019.  Since then she has had 2 exacerbations treated over the phone.  The first 1 before Halloween 2019 and the second 1 before Thanksgiving 2019.  She states that with the second 1 just before Thanksgiving 2019 she really has not recovered.  She says she feels extremely fatigued and miserable.  In fact her cough is significantly worse than baseline.  She is bringing up copious amount of green sputum daily.  She is also having chest tightness and wheezing.  Also short of breath.  There is no hemoptysis or fever or chills or orthopnea proximal nocturnal dyspnea.  Overall she feels miserable. Continues with breo and flutter valve COPD CAT score  She is not on saline nebulizer treatment or vibratory vest or Roflumilast or nebulizer or inhaled anticholinergic.   Patient has has daily productive cough lasting greater than 24 months requiring antibiotic therapy and frequent exacerbations.  Flutter valve has been tried, but has not effectively mobilized secretions.  Considered and ruled out manual CPT as no consistent skilled caregiver available to perform therapy.  CT scan performed on 04/15/2016 confirms bronchiectasis.  Meets indication for vest therapy   OV  12/29/2018  Subjective:  Patient ID: Molly Ross, female , DOB: 10/01/1941 , age 39 y.o. , MRN: [id number] , ADDRESS: [address] [place] [address]   12/29/2018 -   Chief Complaint  Patient presents with   Results   follow bronchiectasis associated with COPD colonized with Haemophilus influenza as determined by bronchoscopy in June 2018 , pseudomonas on sputum Dec 2019   Maintained on Breo and flutter valve and in December 2019 added 3% saline nebulizer and Spiriva   HPI KELISE KUCH 82 y.o. -presents for follow-up after her November 16, 2018 visit for recurrent COPD/bronchiectasis exacerbation.  Sputum culture grew Pseudomonas and will change antibiotics to ciprofloxacin.  We introduce Spiriva, 3% saline nebulizer and also vibratory vest.  Of these measures she did the ciprofloxacin Spiriva and 3% saline nebulizer.  With this along with the Brio and flutter valve her symptoms are significantly improved.  She hardly brings out any sputum and it is clear.  She does not want to do the vibratory vest because she is worried about chest muscle soreness after her breast cancer treatment and also the $1900 co-pay which apparently is one time.  She is not doing any Mucinex.  She is not interested in cyclical suppressive antibiotic therapy at this stage.  She did have a CT scan of the chest and this the bronchiectasis is evident, and compared to 2017 8 mm left lower lobe nodule is unchanged some of the volume loss is unchanged and stable.  There is a report of coronary artery calcification which is a new finding.  She does not have any chest pain.  Her previous cardiologist is Dr. Viann Fish and he did a stress test according to him many years ago.  She wants to see him again.  She is also worried about hiatal hernia finding in the CT scan.  The report is very small hiatal hernia.  But she does admit to significant acid reflux for which  to do a better job with positional therapy. She is up-to-date with flu shot. She continues on Spiriva and Brio triple inhaler therapy. There are no new issues.   OV 04/24/2017  Chief Complaint  Patient presents with   Acute Visit    Pt c/o hoarseness, prod cough with green mucus, chest tightness x 2 days. Pt denies f/c/s and increase in SOB.   Acute visit for this patient with idiopathic bronchiectasis.  December 2017 she saw Dr. Levy Pupa with an acute exacerbation. After that she went back to baseline. She does not use chest physical therapy but she uses inhaler therapy only. Now for the last 2 days she's having increased cough with chest tightness and green mucous. Baseline is clear yellow mucus. Low-grade fever present in the office. No wheezing or increased shortness of breath. Significant hoarseness of voice present and some postnasal drip present. She feels she is in bronchiectasis exacerbation. There are no other issues. It is noted that in July  2017 bronchoscopy did not reveal any organisms on lavage    03/17/2018 Follow up : Bronchiectasis /COPD  Returns for a follow-up for bronchiectasis/COPD .  Says that she has been doing well over the last year.  She had no increased cough or congestion.  No recent antibiotic use.  She denies any shortness of breath, hemoptysis, weight loss, edema.  She does have an occasional cough that is minimally productive.  She is very active and independent.  She remains on BREO daily .,  Does depend on samples.  Has not taken Spiriva in >1 year. Has not felt that she needed this.  Recently diagnosed left breast cancer. Plans for surgery later this week.    OV 09/02/2018  Subjective:  Patient ID: Molly Ross, female , DOB: [birth year] , age 65 y.o. , MRN: [id number] , ADDRESS: [address] [place] [address]   09/02/2018 -   Chief Complaint  Patient presents with   Follow-up    Pt states she has been doing good since last visit except she does have complaints of fatigue and is coughing with occ green to yellow mucus. States cough is worse in the mornings.   82 year old female former smoker followed for COPD and Bronchiectasis. 10/30/2015) baseline and well. Post broncho-dilator FEV1 1.36 L/51% and a ratio 60, total lung capacity 5.2 L/90%, DLCO 16/51%. Last CT 2016 and May 2017 and CXR aug 2019 with chronic changes    HPI Molly Ross 82 y.o. - female with bronchiectasis maintained on Brio. I personally have not seen her since May 2018. Since then she has seen my colleagues for flareupsand gotten anabiotic's of prednisone. Most recently in August 2019. Chest x-ray that time showed just chronic changes. Since that time overall she's feeling well. She is stable. She has sputum production every few days. She uses a flutter valve as needed only. Sometimes of sputum is thick. But overall stable no change in color. Does not feel she is in a flareup.She continuesl and the fluas needed. She is  not on Mucinex. She wants to have the flu shot today.he does been a few years since she had pulmonary function test.   She takes care of her sister in a wheelchair. Another sister died from ovarian cancer.   ROS - per HPI   OV 11/19/2018  Subjective:  Patient ID: Molly Ross, female , DOB: 1941/10/20 , age 28 y.o. , MRN: [id number] , ADDRESS: [address] [place] [address]  had COVID-19.  After that she had 1 flareup of bronchiectasis.  Overall however since the pandemic started she has been masking and social distancing and taking Spiriva and Breo.  This is provided excellent symptom control.  She does not have much symptoms.  Not much sputum.  She continues flutter valve.  She is not on oxygen.  She is not on vibratory vest.  She is not on Roflumilast.  She is not on 3% saline nebulizer.  Sputum production is not much.   She agreed to have the flu shot today  but she declined COVID-vaccine.  She had natural COVID in August 2022.    03/14/2022 Follow up : Bronchiectasis  and COPD  Returns for 4 month follow up . Says was doing better until 4 weeks ago , she fell at home with right sided rib fractures. Went to ER , Chest xray showed minimally displaced right 9/10 rib fractures. Says very sore , sleeping in recliner. No longer taking pain meds. Cough was increased until this week. Worried that she still has pain.Bruising has resolved but still very sore in right ribs and back.   Remains on Stiolto  daily . Uses flutter valve but not able to last few weeks. Not using nebs currently . Has started incentive spirometry . Had Covid 19 infection in January 2023 . Improved with Paxlovid. Cough is at baseline .No fever, hemoptysis , weight loss.  Appetite is fair.        OV 07/05/2022  Subjective:  Patient ID: Molly Ross, female , DOB: 1941/09/01 , age 13 y.o. , MRN: [id number] , ADDRESS: [address] [place] [address] PCP Joselyn Arrow, MD Patient Care Team: Joselyn Arrow, MD as PCP - General (Family Medicine) Almond Lint, MD as Consulting Physician (General Surgery) Serena Croissant, MD as Consulting Physician (Hematology and Oncology) Lonie Peak, MD as Attending Physician (Radiation Oncology) Axel Filler Larna Daughters, NP as Nurse Practitioner (Hematology and Oncology) Kalman Shan, MD as Consulting Physician (Pulmonary Disease) Baldo Daub, MD as Consulting Physician (Cardiology) Kathi Der, MD as Consulting Physician (Gastroenterology) Carman Ching, OD (Optometry) Genia Del, Daisy Blossom, MD as Consulting Physician (Ophthalmology) Joette Catching, DMD (Dentistry)  This Provider for this visit: Treatment Team:  Attending Provider: Kalman Shan, MD    07/05/2022 -   Chief Complaint  Patient presents with   Follow-up    Follow-up    HPI Molly Ross 82 y.o. -returns for follow-up.  She is  doing well.  In between she is seen the nurse practitioner.  In the interim her sister passed away.  I used to know her sister.  Her sister went to SNF 3 years ago and then recently had aspiration and passed away.  She says in 2023-02-26she fell down and fractured her ribs and after that was in the recliner.  Being in the recliner helped her because she avoided going outside.  Currently working at [employer] and able to do it.  She does get tired a little bit.  She is on Stiolto and she likes it.  She is asking for samples but we only had Anoro samples.  She is not that compliant with the flutter valve because she feels she does not need it.  Overall stable she had pulmonary function test which I believe shows stability/slight age-related decline I went over the results with her and visualized the image myself    CT Chest data  No results found.  to do a better job with positional therapy. She is up-to-date with flu shot. She continues on Spiriva and Brio triple inhaler therapy. There are no new issues.   OV 04/24/2017  Chief Complaint  Patient presents with   Acute Visit    Pt c/o hoarseness, prod cough with green mucus, chest tightness x 2 days. Pt denies f/c/s and increase in SOB.   Acute visit for this patient with idiopathic bronchiectasis.  December 2017 she saw Dr. Levy Pupa with an acute exacerbation. After that she went back to baseline. She does not use chest physical therapy but she uses inhaler therapy only. Now for the last 2 days she's having increased cough with chest tightness and green mucous. Baseline is clear yellow mucus. Low-grade fever present in the office. No wheezing or increased shortness of breath. Significant hoarseness of voice present and some postnasal drip present. She feels she is in bronchiectasis exacerbation. There are no other issues. It is noted that in July  2017 bronchoscopy did not reveal any organisms on lavage    03/17/2018 Follow up : Bronchiectasis /COPD  Returns for a follow-up for bronchiectasis/COPD .  Says that she has been doing well over the last year.  She had no increased cough or congestion.  No recent antibiotic use.  She denies any shortness of breath, hemoptysis, weight loss, edema.  She does have an occasional cough that is minimally productive.  She is very active and independent.  She remains on BREO daily .,  Does depend on samples.  Has not taken Spiriva in >1 year. Has not felt that she needed this.  Recently diagnosed left breast cancer. Plans for surgery later this week.    OV 09/02/2018  Subjective:  Patient ID: Molly Ross, female , DOB: [birth year] , age 65 y.o. , MRN: [id number] , ADDRESS: [address] [place] [address]   09/02/2018 -   Chief Complaint  Patient presents with   Follow-up    Pt states she has been doing good since last visit except she does have complaints of fatigue and is coughing with occ green to yellow mucus. States cough is worse in the mornings.   82 year old female former smoker followed for COPD and Bronchiectasis. 10/30/2015) baseline and well. Post broncho-dilator FEV1 1.36 L/51% and a ratio 60, total lung capacity 5.2 L/90%, DLCO 16/51%. Last CT 2016 and May 2017 and CXR aug 2019 with chronic changes    HPI Molly Ross 82 y.o. - female with bronchiectasis maintained on Brio. I personally have not seen her since May 2018. Since then she has seen my colleagues for flareupsand gotten anabiotic's of prednisone. Most recently in August 2019. Chest x-ray that time showed just chronic changes. Since that time overall she's feeling well. She is stable. She has sputum production every few days. She uses a flutter valve as needed only. Sometimes of sputum is thick. But overall stable no change in color. Does not feel she is in a flareup.She continuesl and the fluas needed. She is  not on Mucinex. She wants to have the flu shot today.he does been a few years since she had pulmonary function test.   She takes care of her sister in a wheelchair. Another sister died from ovarian cancer.   ROS - per HPI   OV 11/19/2018  Subjective:  Patient ID: Molly Ross, female , DOB: 1941/10/20 , age 28 y.o. , MRN: [id number] , ADDRESS: [address] [place] [address]  to do a better job with positional therapy. She is up-to-date with flu shot. She continues on Spiriva and Brio triple inhaler therapy. There are no new issues.   OV 04/24/2017  Chief Complaint  Patient presents with   Acute Visit    Pt c/o hoarseness, prod cough with green mucus, chest tightness x 2 days. Pt denies f/c/s and increase in SOB.   Acute visit for this patient with idiopathic bronchiectasis.  December 2017 she saw Dr. Levy Pupa with an acute exacerbation. After that she went back to baseline. She does not use chest physical therapy but she uses inhaler therapy only. Now for the last 2 days she's having increased cough with chest tightness and green mucous. Baseline is clear yellow mucus. Low-grade fever present in the office. No wheezing or increased shortness of breath. Significant hoarseness of voice present and some postnasal drip present. She feels she is in bronchiectasis exacerbation. There are no other issues. It is noted that in July  2017 bronchoscopy did not reveal any organisms on lavage    03/17/2018 Follow up : Bronchiectasis /COPD  Returns for a follow-up for bronchiectasis/COPD .  Says that she has been doing well over the last year.  She had no increased cough or congestion.  No recent antibiotic use.  She denies any shortness of breath, hemoptysis, weight loss, edema.  She does have an occasional cough that is minimally productive.  She is very active and independent.  She remains on BREO daily .,  Does depend on samples.  Has not taken Spiriva in >1 year. Has not felt that she needed this.  Recently diagnosed left breast cancer. Plans for surgery later this week.    OV 09/02/2018  Subjective:  Patient ID: Molly Ross, female , DOB: [birth year] , age 65 y.o. , MRN: [id number] , ADDRESS: [address] [place] [address]   09/02/2018 -   Chief Complaint  Patient presents with   Follow-up    Pt states she has been doing good since last visit except she does have complaints of fatigue and is coughing with occ green to yellow mucus. States cough is worse in the mornings.   82 year old female former smoker followed for COPD and Bronchiectasis. 10/30/2015) baseline and well. Post broncho-dilator FEV1 1.36 L/51% and a ratio 60, total lung capacity 5.2 L/90%, DLCO 16/51%. Last CT 2016 and May 2017 and CXR aug 2019 with chronic changes    HPI Molly Ross 82 y.o. - female with bronchiectasis maintained on Brio. I personally have not seen her since May 2018. Since then she has seen my colleagues for flareupsand gotten anabiotic's of prednisone. Most recently in August 2019. Chest x-ray that time showed just chronic changes. Since that time overall she's feeling well. She is stable. She has sputum production every few days. She uses a flutter valve as needed only. Sometimes of sputum is thick. But overall stable no change in color. Does not feel she is in a flareup.She continuesl and the fluas needed. She is  not on Mucinex. She wants to have the flu shot today.he does been a few years since she had pulmonary function test.   She takes care of her sister in a wheelchair. Another sister died from ovarian cancer.   ROS - per HPI   OV 11/19/2018  Subjective:  Patient ID: Molly Ross, female , DOB: 1941/10/20 , age 28 y.o. , MRN: [id number] , ADDRESS: [address] [place] [address]  11/19/2018 -   Chief Complaint  Patient presents with   Follow-up    f/u bronchiectasis, still coughing up green mucus,      HPI Molly Ross 82 y.o. -follow bronchiectasis associated with COPD colonized with Haemophilus influenza as determined by bronchoscopy in June 2018.  Maintained on Breo and flutter valve  Last seen mid September 2019.  Since then she has had 2 exacerbations treated over the phone.  The first 1 before Halloween 2019 and the second 1 before Thanksgiving 2019.  She states that with the second 1 just before Thanksgiving 2019 she really has not recovered.  She says she feels extremely fatigued and miserable.  In fact her cough is significantly worse than baseline.  She is bringing up copious amount of green sputum daily.  She is also having chest tightness and wheezing.  Also short of breath.  There is no hemoptysis or fever or chills or orthopnea proximal nocturnal dyspnea.  Overall she feels miserable. Continues with breo and flutter valve COPD CAT score  She is not on saline nebulizer treatment or vibratory vest or Roflumilast or nebulizer or inhaled anticholinergic.   Patient has has daily productive cough lasting greater than 24 months requiring antibiotic therapy and frequent exacerbations.  Flutter valve has been tried, but has not effectively mobilized secretions.  Considered and ruled out manual CPT as no consistent skilled caregiver available to perform therapy.  CT scan performed on 04/15/2016 confirms bronchiectasis.  Meets indication for vest therapy   OV  12/29/2018  Subjective:  Patient ID: Molly Ross, female , DOB: 10/01/1941 , age 39 y.o. , MRN: [id number] , ADDRESS: [address] [place] [address]   12/29/2018 -   Chief Complaint  Patient presents with   Results   follow bronchiectasis associated with COPD colonized with Haemophilus influenza as determined by bronchoscopy in June 2018 , pseudomonas on sputum Dec 2019   Maintained on Breo and flutter valve and in December 2019 added 3% saline nebulizer and Spiriva   HPI KELISE KUCH 82 y.o. -presents for follow-up after her November 16, 2018 visit for recurrent COPD/bronchiectasis exacerbation.  Sputum culture grew Pseudomonas and will change antibiotics to ciprofloxacin.  We introduce Spiriva, 3% saline nebulizer and also vibratory vest.  Of these measures she did the ciprofloxacin Spiriva and 3% saline nebulizer.  With this along with the Brio and flutter valve her symptoms are significantly improved.  She hardly brings out any sputum and it is clear.  She does not want to do the vibratory vest because she is worried about chest muscle soreness after her breast cancer treatment and also the $1900 co-pay which apparently is one time.  She is not doing any Mucinex.  She is not interested in cyclical suppressive antibiotic therapy at this stage.  She did have a CT scan of the chest and this the bronchiectasis is evident, and compared to 2017 8 mm left lower lobe nodule is unchanged some of the volume loss is unchanged and stable.  There is a report of coronary artery calcification which is a new finding.  She does not have any chest pain.  Her previous cardiologist is Dr. Viann Fish and he did a stress test according to him many years ago.  She wants to see him again.  She is also worried about hiatal hernia finding in the CT scan.  The report is very small hiatal hernia.  But she does admit to significant acid reflux for which  she is not taking any treatment.  She is agreed to  take Prilosec and will follow-up with primary care physician.   OV 01/17/2020  Subjective:  Patient ID: Molly Ross, female , DOB: 1941/04/23 , age 35 y.o. , MRN: [id number] , ADDRESS: [address] [place] [address]   01/17/2020 -   Chief Complaint  Patient presents with   Follow-up    yearly follow up - breathing is at baseline, no new complaints   follow bronchiectasis [idiopathic work-up February 2017 with negative serology and negative immunoglobulin] associated with COPD colonized with Haemophilus influenza as determined by bronchoscopy in June 2018 , pseudomonas on sputum Dec 2019   Maintained on Breo, [place] and flutter valve and in December 2019.  Not taking 3% saline nebulizer  Normal stress Myoview in August 2016  Background history of breast cancer followed by Dr. Pamelia Hoit   HPI Molly Ross 82 y.o. -returns for follow-up.  Last seen in January 2020.  After that because of the pandemic 2019 with Covid she has not returned for follow-up.  She says she has been followed from her job.  Without the external exposures in the environment she has been social distancing and isolating.  Therefore because of this her respiratory symptoms are better.  Her sputum production has improved a lot.  She certainly had 1 exacerbation.  Sputum volume is less cough is much less.  She is only on [place].  She decided not to do the 3% saline nebulizer because she feels she does not need it.  She barely brings out any sputum.  She has had a first dose COVID-19 vaccine Moderna.  She will have the second dose soon    Last pulmonary function test November 2016 with FEV1 postbronchodilator 1.36/51% and DLCO 16/51  Last CT scan of the chest January 2020   ROS - per HPI  OV 10/26/2021  Subjective:  Patient ID: Molly Ross, female , DOB: [birth year] , age 24 y.o. , MRN: [id number] , ADDRESS: [address] [place] [address] PCP Joselyn Arrow, MD Patient  Care Team: Joselyn Arrow, MD as PCP - General (Family Medicine) Almond Lint, MD as Consulting Physician (General Surgery) Serena Croissant, MD as Consulting Physician (Hematology and Oncology) Lonie Peak, MD as Attending Physician (Radiation Oncology) Loa Socks, NP as Nurse Practitioner (Hematology and Oncology) Othella Boyer, MD as Consulting Physician (Cardiology)  This Provider for this visit: Treatment Team:  Attending Provider: Kalman Shan, MD    10/26/2021 -   Chief Complaint  Patient presents with   Follow-up    Pt states she has been doing good since last visit. States she has had one flare up since last visit but has been doing okay.   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  Not taking 3% saline nebulizer  Normal stress Myoview in August 2016  Background history of breast cancer followed by Dr. Pamelia Hoit  HPI Molly Ross 82 y.o. -follow-up bronchiectasis.  Personally not since early 2021.  She was supposed to establish care with Dr. Chestine Spore on 04 colleagues who specialize in bronchiectasis.  But Dr. Chestine Spore is now doing only critical care medicine.  Therefore she is back to see me.  In August 2022 she  to do a better job with positional therapy. She is up-to-date with flu shot. She continues on Spiriva and Brio triple inhaler therapy. There are no new issues.   OV 04/24/2017  Chief Complaint  Patient presents with   Acute Visit    Pt c/o hoarseness, prod cough with green mucus, chest tightness x 2 days. Pt denies f/c/s and increase in SOB.   Acute visit for this patient with idiopathic bronchiectasis.  December 2017 she saw Dr. Levy Pupa with an acute exacerbation. After that she went back to baseline. She does not use chest physical therapy but she uses inhaler therapy only. Now for the last 2 days she's having increased cough with chest tightness and green mucous. Baseline is clear yellow mucus. Low-grade fever present in the office. No wheezing or increased shortness of breath. Significant hoarseness of voice present and some postnasal drip present. She feels she is in bronchiectasis exacerbation. There are no other issues. It is noted that in July  2017 bronchoscopy did not reveal any organisms on lavage    03/17/2018 Follow up : Bronchiectasis /COPD  Returns for a follow-up for bronchiectasis/COPD .  Says that she has been doing well over the last year.  She had no increased cough or congestion.  No recent antibiotic use.  She denies any shortness of breath, hemoptysis, weight loss, edema.  She does have an occasional cough that is minimally productive.  She is very active and independent.  She remains on BREO daily .,  Does depend on samples.  Has not taken Spiriva in >1 year. Has not felt that she needed this.  Recently diagnosed left breast cancer. Plans for surgery later this week.    OV 09/02/2018  Subjective:  Patient ID: Molly Ross, female , DOB: [birth year] , age 65 y.o. , MRN: [id number] , ADDRESS: [address] [place] [address]   09/02/2018 -   Chief Complaint  Patient presents with   Follow-up    Pt states she has been doing good since last visit except she does have complaints of fatigue and is coughing with occ green to yellow mucus. States cough is worse in the mornings.   82 year old female former smoker followed for COPD and Bronchiectasis. 10/30/2015) baseline and well. Post broncho-dilator FEV1 1.36 L/51% and a ratio 60, total lung capacity 5.2 L/90%, DLCO 16/51%. Last CT 2016 and May 2017 and CXR aug 2019 with chronic changes    HPI Molly Ross 82 y.o. - female with bronchiectasis maintained on Brio. I personally have not seen her since May 2018. Since then she has seen my colleagues for flareupsand gotten anabiotic's of prednisone. Most recently in August 2019. Chest x-ray that time showed just chronic changes. Since that time overall she's feeling well. She is stable. She has sputum production every few days. She uses a flutter valve as needed only. Sometimes of sputum is thick. But overall stable no change in color. Does not feel she is in a flareup.She continuesl and the fluas needed. She is  not on Mucinex. She wants to have the flu shot today.he does been a few years since she had pulmonary function test.   She takes care of her sister in a wheelchair. Another sister died from ovarian cancer.   ROS - per HPI   OV 11/19/2018  Subjective:  Patient ID: Molly Ross, female , DOB: 1941/10/20 , age 28 y.o. , MRN: [id number] , ADDRESS: [address] [place] [address]  to do a better job with positional therapy. She is up-to-date with flu shot. She continues on Spiriva and Brio triple inhaler therapy. There are no new issues.   OV 04/24/2017  Chief Complaint  Patient presents with   Acute Visit    Pt c/o hoarseness, prod cough with green mucus, chest tightness x 2 days. Pt denies f/c/s and increase in SOB.   Acute visit for this patient with idiopathic bronchiectasis.  December 2017 she saw Dr. Levy Pupa with an acute exacerbation. After that she went back to baseline. She does not use chest physical therapy but she uses inhaler therapy only. Now for the last 2 days she's having increased cough with chest tightness and green mucous. Baseline is clear yellow mucus. Low-grade fever present in the office. No wheezing or increased shortness of breath. Significant hoarseness of voice present and some postnasal drip present. She feels she is in bronchiectasis exacerbation. There are no other issues. It is noted that in July  2017 bronchoscopy did not reveal any organisms on lavage    03/17/2018 Follow up : Bronchiectasis /COPD  Returns for a follow-up for bronchiectasis/COPD .  Says that she has been doing well over the last year.  She had no increased cough or congestion.  No recent antibiotic use.  She denies any shortness of breath, hemoptysis, weight loss, edema.  She does have an occasional cough that is minimally productive.  She is very active and independent.  She remains on BREO daily .,  Does depend on samples.  Has not taken Spiriva in >1 year. Has not felt that she needed this.  Recently diagnosed left breast cancer. Plans for surgery later this week.    OV 09/02/2018  Subjective:  Patient ID: Molly Ross, female , DOB: [birth year] , age 65 y.o. , MRN: [id number] , ADDRESS: [address] [place] [address]   09/02/2018 -   Chief Complaint  Patient presents with   Follow-up    Pt states she has been doing good since last visit except she does have complaints of fatigue and is coughing with occ green to yellow mucus. States cough is worse in the mornings.   82 year old female former smoker followed for COPD and Bronchiectasis. 10/30/2015) baseline and well. Post broncho-dilator FEV1 1.36 L/51% and a ratio 60, total lung capacity 5.2 L/90%, DLCO 16/51%. Last CT 2016 and May 2017 and CXR aug 2019 with chronic changes    HPI Molly Ross 82 y.o. - female with bronchiectasis maintained on Brio. I personally have not seen her since May 2018. Since then she has seen my colleagues for flareupsand gotten anabiotic's of prednisone. Most recently in August 2019. Chest x-ray that time showed just chronic changes. Since that time overall she's feeling well. She is stable. She has sputum production every few days. She uses a flutter valve as needed only. Sometimes of sputum is thick. But overall stable no change in color. Does not feel she is in a flareup.She continuesl and the fluas needed. She is  not on Mucinex. She wants to have the flu shot today.he does been a few years since she had pulmonary function test.   She takes care of her sister in a wheelchair. Another sister died from ovarian cancer.   ROS - per HPI   OV 11/19/2018  Subjective:  Patient ID: Molly Ross, female , DOB: 1941/10/20 , age 28 y.o. , MRN: [id number] , ADDRESS: [address] [place] [address]

## 2023-09-26 NOTE — Addendum Note (Signed)
Addended by: Glynda Jaeger on: 09/26/2023 10:28 AM   Modules accepted: Orders

## 2023-09-26 NOTE — Patient Instructions (Addendum)
ICD-10-CM   1. Bronchiectasis without complication (HCC)  J47.9     2. Vaccine counseling  Z71.85       Bronchiectasis is stable.  Currently on Stiolto.  Glad you had the flu shot.  Noted that you do not tolerate the vest therapy  Plan - Please do take scheduled over-the-counter Mucinex - Please also start/restart 3 mL of 3% hypertonic saline nebulizer at least once daily for pulmonary clearance  -[facility]. -Respect no vest therapy -Continue Stiolto scheduled with albuterol as needed -Please definitely get the RSV shot highly recommended. -For the next flareup we will send a new prescription I had of time  - Please take prednisone 40 mg x1 day, then 30 mg x1 day, then 20 mg x1 day, then 10 mg x1 day, and then 5 mg x1 day and stop  -Ciprofloxacin 750 mg twice daily x 7 days.  FOllowup  6months

## 2023-11-28 ENCOUNTER — Other Ambulatory Visit: Payer: Self-pay | Admitting: Family Medicine

## 2023-11-28 DIAGNOSIS — I700 Atherosclerosis of aorta: Secondary | ICD-10-CM

## 2023-12-13 ENCOUNTER — Other Ambulatory Visit: Payer: Self-pay | Admitting: Internal Medicine

## 2024-01-28 ENCOUNTER — Other Ambulatory Visit: Payer: Self-pay | Admitting: Family Medicine

## 2024-01-28 ENCOUNTER — Encounter: Payer: Self-pay | Admitting: *Deleted

## 2024-01-28 DIAGNOSIS — Z1231 Encounter for screening mammogram for malignant neoplasm of breast: Secondary | ICD-10-CM

## 2024-01-28 NOTE — Progress Notes (Unsigned)
No chief complaint on file.  Molly Ross is a 83 y.o. female who presents for annual physical exam, Medicare wellness visit and follow-up on chronic medical conditions.    Osteoporosis:  She was started on Alendronate in 02/2020 for osteopenia with elevated FRAX score (4.6% for hip). She tolerated this without any side effects, but stopped taking it when the prescription ran out (after 1 year). She was restarted on this 11/2021. 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 Last year she reported missing alendronate at least half the time. Insurance didn't cover Prolia. She was switched to [facility] in 06/2023. She reports compliance with monthly Boniva, taking it appropriately, once a month. She denies side effects. She doesn't drink milk or eat yogurt, has some cheese.  Eats collards, kale. She has had repeated courses of prednisone related to flares of bronchiectasis.  History of vitamin D deficiency: Last check was low end of normal last year, when taking MVI, Ca+D and D3 1000 IU daily. She is currently taking  Component Ref Range & Units (hover) 1 yr ago (01/20/23) 6 yr ago (01/05/18) 7 yr ago (12/30/16) 8 yr ago (12/25/15) 8 yr ago (06/05/15) 9 yr ago (01/20/15)  Vit D, 25-Hydroxy 31.2 52.2 CM 44 R, CM 39 R, CM 35 R, CM 17 Low  R, CM    Left breast cancer 02/2018, s/p lumpectomy and radiation. She had been doing well on letrozole, without any side side effects. Plan was to take letrozole 2.5 mg for 5-7 years (started 05/15/2019). She last got this filled for #30 in 07/2022.  She is past due to see Dr. Michel Bickers seen in 03/2021.  She missed her appt with Dr. Pamelia Hoit in 09/2022, and hasn't rescheduled. She denies any breast complaints today.  She has some chronic tenderness of the left breast. Last mammogram was in 07/2022, has appointment scheduled for later this month.  CAD: She was noted to have coronary  calcification in 2 vessels on CT scan in 2016, saw Dr. Donnie Aho and had a myocardial perfusion scan. She had no evidence of ischemia with an ejection fraction of 76%. She has been compliant with taking statin, and denies side effects (takes 20mg  atorvastatin, had some issues with discomfort in leg on the initial 40mg  dose). She follows a lowfat, low cholesterol diet.  She occasionally gets some jaw discomfort/numbness, along with some chest pressure which extends to her back. Taking NTG relieves these symptoms.  This can occur at rest, not necessarily with exertion. Very infrequent. *** 2/year?? ***  She continues to have DOE, unchanged, related to her underlying lung disease.  She is taking aspirin 81 mg daily. She last saw Dr. Dulce Sellar in 07/2021, was to follow-up in 1 year.  Lipids were at goal on last check: Lab Results  Component Value Date   CHOL 158 01/20/2023   HDL 81 01/20/2023   LDLCALC 66 01/20/2023   TRIG 51 01/20/2023   CHOLHDL 2.0 01/20/2023   Aortic atherosclerosis:  noted on CT of chest in 12/2018.  She is compliant with her statin.  Emphysema and bronchiectasis: Under the care of pulmonologist, last seen in October, due again in April. She continues on [employer].  She doesn't require oxygen.  She continues to use flutter valve, but admits that she only uses it once a week most of the time, more frequently with flares (recommended 2-4x/d).  She has been using 3% hypertonic saline  nebulizer daily for pulmonary clearance.  Supposed to be using scheduled Mucinex. She doesn't tolerate use of the vest. She was given a course of prednisone and cipro to have on hand (by pulm at 09/2023 visit).  Hiatal hernia was noted on CT done in past by pulm. It was small, but felt it might exacerbate her pulmonary symptoms, so they started her on Prilosec 20mg .  She is doing well on this. She denies heartburn, belching, or dysphagia.    Immunization History  Administered Date(s) Administered   Fluad  Quad(high Dose 65+) 08/25/2019, 09/07/2020, 10/26/2021, 08/26/2022, 09/05/2023   Influenza, High Dose Seasonal PF 01/26/2014, 10/19/2014, 08/30/2015, 08/08/2016, 08/05/2017, 09/02/2018   Moderna Sars-Covid-2 Vaccination 12/29/2019, 01/26/2020   Pneumococcal Conjugate-13 12/25/2015   Pneumococcal Polysaccharide-23 12/30/2016   Tdap 09/03/2017   Zoster Recombinant(Shingrix) 03/25/2021   Declines further COVID boosters. Last Pap smear: 12/2015, normal, no high risk HPV detected Last mammogram: 07/2022 (scheduled for 01/2024) Last colonoscopy:  10/2017; 7 tubular adenomas found, and diverticulosis. Repeat was due 10/2020 (3 yr f/u). Last DEXA: 07/2022 T-2.5 R fem neck (prior--02/2020 T-2.1 R fem neck, FRAX elevated at 4.6% hip) Dentist: twice yearly Ophtho: yearly Exercise:   Working almost every day, walks a lot on the job (frequent stopping, not cardio). (Previously would walk 15 minutes up and down the driveway, when working less). Lifts some boxes at work, no other weight-bearing exercise.  Patient Care Team: Joselyn Arrow, MD as PCP - General (Family Medicine) Almond Lint, MD as Consulting Physician (General Surgery) Serena Croissant, MD as Consulting Physician (Hematology and Oncology) Lonie Peak, MD as Attending Physician (Radiation Oncology) Axel Filler, Larna Daughters, NP as Nurse Practitioner (Hematology and Oncology) Kalman Shan, MD as Consulting Physician (Pulmonary Disease) Baldo Daub, MD as Consulting Physician (Cardiology) Kathi Der, MD as Consulting Physician (Gastroenterology) Carman Ching, OD (Optometry) Genia Del, Daisy Blossom, MD as Consulting Physician (Ophthalmology) Joette Catching, DMD (Dentistry) Dr. Gayleen Orem passed away, going to the same practice  Depression Screening: Flowsheet Row Office Visit from 01/20/2023 in [facility]  PHQ-2 Total Score 0        Falls screen:     01/20/2023    8:57 AM 08/26/2022    3:12 PM 12/05/2021     2:07 PM 09/07/2020    9:38 AM 04/07/2019    8:35 AM  Fall Risk   Falls in the past year? 0 1 0 0 0  Number falls in past yr: 0 0 0    Comment  02/12/22-fell at home in laundry room     Injury with Fall? 0 1 0    Comment  2 broken ribs     Risk for fall due to : No Fall Risks History of fall(s) No Fall Risks    Follow up Falls evaluation completed Falls evaluation completed Falls evaluation completed       Functional Status Survey:          End of Life Discussion:  Patient has a living will and medical power of attorney (scanned 03/2018).  PMH, PSH, SH and FH were reviewed and updated.    ROS: The patient denies anorexia, fever, headaches, vision changes, decreased hearing, ear pain, sore throat, breast concerns, chest pain, palpitations, dizziness, syncope, dyspnea on exertion, cough, swelling, nausea, vomiting, diarrhea, constipation, abdominal pain, melena, hematochezia, indigestion/heartburn, hematuria, dysuria, vaginal bleeding, discharge, odor or itch, genital lesions, joint pains, numbness, tingling, weakness, tremor, suspicious skin lesions, depression, abnormal bleeding/bruising, or enlarged lymph nodes.  Breathing is at  baseline/improved.  Baseline is shortness of breath with activity. Chest heaviness/jaw discomfort is very infrequent (and r/b NTG). Some leakage of urine with cough/sneeze    PHYSICAL EXAM:  There were no vitals taken for this visit.  Wt Readings from Last 3 Encounters:  09/26/23 137 lb 12.8 oz (62.5 kg)  07/02/23 137 lb (62.1 kg)  04/29/23 142 lb 9.6 oz (64.7 kg)    General Appearance:    Alert, cooperative, no distress, appears stated age. Occasional clearing of throat, rare cough.  Speaking easily/comfortably  Head:    Normocephalic, without obvious abnormality, atraumatic  Eyes:    PERRL, conjunctiva/corneas clear, EOM's intact, fundi benign  Ears:    Normal TM's and external ear canals  Nose:   No drainage or sinus tenderness  Throat:   Normal  mucosa  Neck:   Supple, no lymphadenopathy;  thyroid:  no enlargement/ tenderness/ nodules; no carotid bruit or JVD  Back:    Spine nontender, no curvature, ROM normal, no CVA tenderness  Lungs:     Good air movement. No wheezes, or ronchi. Breath sounds were a little coarser on the right, and crackles noted on the right. ***Respirations unlabored.   Chest Wall:    No tenderness or deformity   Heart:    Regular rate and rhythm, S1 and S2 normal, no murmur, rub or gallop.   Breast Exam:    No tenderness, masses, or nipple discharge or inversion on the right. WHSS in R upper breast.  Left breast has WHSS, below areola. There is some diffuse L breast tenderness, more prominent at L UOQ, and medially.  Slight fibroglandular changes in UOQ, no discrete mass.  No axillary lymphadenopathy.    Abdomen:     Soft, non-tender, nondistended, normoactive bowel sounds, no masses, no hepatosplenomegaly  Genitalia:    Normal external genitalia without lesions, some atrophy noted.  BUS and vagina normal; no cervical motion tenderness. No abnormal vaginal discharge.  Uterus and adnexa not enlarged, nontender, no masses.  Pap not performed ***  Rectal:    Normal sphincter tone, no masses.  Heme negative stool ***  Extremities:   No clubbing, cyanosis. Wearing compression socks.  No edema  Pulses:   2+ and symmetric all extremities  Skin:   Skin color, texture, turgor normal, no lesions. She has many seborrheic keratoses throughout her body many of which are large. There are also many scattered angiomas.  Lymph nodes:   Cervical, supraclavicular, inguinal and axillary nodes normal  Neurologic:   Normal strength, sensation and gait; reflexes 2+ and symmetric throughout                                  Psych:   Normal mood, affect, hygiene and grooming  ***UPDATE lung and breast exam Skin--purpura on forearm?? Update/decline pelvic rectal if not done  ASSESSMENT/PLAN:  Did she get RSV, 2nd Shingrix? Can get  Prevnar-20. Order DEXA for 07/2024 to f/u osteoporosis  Is she taking letrozole?  Looks like rx'd #30 in 07/2022. She hasn't seen oncologist since 2022, so likely hasn't been taking, past due to be seen. Past due for mammo, but is scheduled for later this month. Needs to f/u with Dr. Pamelia Hoit!  Also past due to see Dr. Dulce Sellar (cardiology).  Doesn't need to have pelvic and rectal exam if not having GU complaints.   Discussed monthly self breast exams and yearly mammograms (past due, but scheduled);  at least 30 minutes of aerobic activity at least 5 days/week and weight-bearing exercise 2x/week; proper sunscreen use reviewed; healthy diet, including goals of calcium and vitamin D intake and alcohol recommendations (less than or equal to 1 drink/day) reviewed; regular seatbelt use; changing batteries in smoke detectors.  Immunization recommendations discussed--continue yearly Flu shots. COVID bivalent booster strongly encouraged, declined.  2nd Shingrix recommended, to get from pharmacy. As discussed last year, 2nd dose is late, efficacy may be decreased, but that she should still get this. RSV  Prevnar-20 Colonoscopy was due 10/2020.  Needs to d/w GI if still recommended.  F/u 1 year (AWV/CPE), sooner prn. Offered her 6 month f/u (to ensure that she was keeping up on her other appointments, vaccines, etc), but she declined.  Full Code, Full care. MOST form reviewed/signed.   Medicare Attestation I have personally reviewed: The patient's medical and social history Their use of alcohol, tobacco or illicit drugs Their current medications and supplements The patient's functional ability including ADLs,fall risks, home safety risks, cognitive, and hearing and visual impairment Diet and physical activities Evidence for depression or mood disorders  The patient's weight, height, BMI have been recorded in the chart.  I have made referrals, counseling, and provided education to the patient based on  review of the above and I have provided the patient with a written personalized care plan for preventive services.

## 2024-01-28 NOTE — Patient Instructions (Incomplete)
HEALTH MAINTENANCE RECOMMENDATIONS:  It is recommended that you get at least 30 minutes of aerobic exercise at least 5 days/week (for weight loss, you may need as much as 60-90 minutes). This can be any activity that gets your heart rate up. This can be divided in 10-15 minute intervals if needed, but try and build up your endurance at least once a week.  Weight bearing exercise is also recommended twice weekly.  Eat a healthy diet with lots of vegetables, fruits and fiber.  "Colorful" foods have a lot of vitamins (ie green vegetables, tomatoes, red peppers, etc).  Limit sweet tea, regular sodas and alcoholic beverages, all of which has a lot of calories and sugar.  Up to 1 alcoholic drink daily may be beneficial for women (unless trying to lose weight, watch sugars).  Drink a lot of water.  Calcium recommendations are 1200-1500 mg daily (1500 mg for postmenopausal women or women without ovaries), and vitamin D 1000 IU daily.  This should be obtained from diet and/or supplements (vitamins), and calcium should not be taken all at once, but in divided doses.  Monthly self breast exams and yearly mammograms for women over the age of 56 is recommended.  Sunscreen of at least SPF 30 should be used on all sun-exposed parts of the skin when outside between the hours of 10 am and 4 pm (not just when at beach or pool, but even with exercise, golf, tennis, and yard work!)  Use a sunscreen that says "broad spectrum" so it covers both UVA and UVB rays, and make sure to reapply every 1-2 hours.  Remember to change the batteries in your smoke detectors when changing your clock times in the spring and fall. Carbon monoxide detectors are recommended for your home.  Use your seat belt every time you are in a car, and please drive safely and not be distracted with cell phones and texting while driving.   Molly Ross , Thank you for taking time to come for your Medicare Wellness Visit. I appreciate your ongoing  commitment to your health goals. Please review the following plan we discussed and let me know if I can assist you in the future.   This is a list of the screening recommended for you and due dates:  Health Maintenance  Topic Date Due   Colon Cancer Screening  11/11/2020   Zoster (Shingles) Vaccine (2 of 2) 05/20/2021   COVID-19 Vaccine (3 - Moderna risk series) 02/13/2024*   Medicare Annual Wellness Visit  01/28/2025   DTaP/Tdap/Td vaccine (2 - Td or Tdap) 09/04/2027   Pneumonia Vaccine  Completed   Flu Shot  Completed   DEXA scan (bone density measurement)  Completed   HPV Vaccine  Aged Out   Hepatitis C Screening  Discontinued  *Topic was postponed. The date shown is not the original due date.    You are past due to see Dr. Pamelia Hoit. You are past due to see Dr. Dulce Sellar. Please schedule these appointments.  RSV vaccine is recommended, please get this from the pharmacy if you haven't gotten it yet.  It is recommended that you get your second shingles vaccine.  While it is late (was supposed to be within 6 months of the first dose), there is still benefit to getting this. You need to get this from the pharmacy. You are past due for your colonoscopy. You had many polyps in 2018, and a 3 year follow-up was recommended. I believe I had asked you to touch  base with your [facility] doctor about this, and likely to also need to discuss with your lung doctors if this is okay. Whether you need another colonoscopy should be discussed with Dr. Levora Angel [facility]).

## 2024-01-29 ENCOUNTER — Encounter: Payer: Self-pay | Admitting: Family Medicine

## 2024-01-29 ENCOUNTER — Ambulatory Visit (INDEPENDENT_AMBULATORY_CARE_PROVIDER_SITE_OTHER): Payer: Medicare Other | Admitting: Family Medicine

## 2024-01-29 VITALS — BP 118/60 | HR 64 | Ht 69.0 in | Wt 138.8 lb

## 2024-01-29 DIAGNOSIS — J449 Chronic obstructive pulmonary disease, unspecified: Secondary | ICD-10-CM

## 2024-01-29 DIAGNOSIS — Z23 Encounter for immunization: Secondary | ICD-10-CM | POA: Diagnosis not present

## 2024-01-29 DIAGNOSIS — E559 Vitamin D deficiency, unspecified: Secondary | ICD-10-CM

## 2024-01-29 DIAGNOSIS — J479 Bronchiectasis, uncomplicated: Secondary | ICD-10-CM | POA: Diagnosis not present

## 2024-01-29 DIAGNOSIS — Z5181 Encounter for therapeutic drug level monitoring: Secondary | ICD-10-CM | POA: Diagnosis not present

## 2024-01-29 DIAGNOSIS — Z131 Encounter for screening for diabetes mellitus: Secondary | ICD-10-CM

## 2024-01-29 DIAGNOSIS — Z0000 Encounter for general adult medical examination without abnormal findings: Secondary | ICD-10-CM | POA: Diagnosis not present

## 2024-01-29 DIAGNOSIS — Z853 Personal history of malignant neoplasm of breast: Secondary | ICD-10-CM

## 2024-01-29 DIAGNOSIS — Z170 Estrogen receptor positive status [ER+]: Secondary | ICD-10-CM

## 2024-01-29 DIAGNOSIS — I700 Atherosclerosis of aorta: Secondary | ICD-10-CM | POA: Diagnosis not present

## 2024-01-29 DIAGNOSIS — M810 Age-related osteoporosis without current pathological fracture: Secondary | ICD-10-CM

## 2024-01-29 DIAGNOSIS — I25118 Atherosclerotic heart disease of native coronary artery with other forms of angina pectoris: Secondary | ICD-10-CM

## 2024-01-29 DIAGNOSIS — E785 Hyperlipidemia, unspecified: Secondary | ICD-10-CM | POA: Diagnosis not present

## 2024-01-29 LAB — CBC WITH DIFFERENTIAL/PLATELET
Basophils Absolute: 0.1 10*3/uL (ref 0.0–0.2)
Basos: 1 %
EOS (ABSOLUTE): 0.1 10*3/uL (ref 0.0–0.4)
Eos: 2 %
Hematocrit: 41.7 % (ref 34.0–46.6)
Hemoglobin: 13.5 g/dL (ref 11.1–15.9)
Immature Grans (Abs): 0.1 10*3/uL (ref 0.0–0.1)
Immature Granulocytes: 1 %
Lymphocytes Absolute: 1.3 10*3/uL (ref 0.7–3.1)
Lymphs: 17 %
MCH: 29.2 pg (ref 26.6–33.0)
MCHC: 32.4 g/dL (ref 31.5–35.7)
MCV: 90 fL (ref 79–97)
Monocytes Absolute: 0.8 10*3/uL (ref 0.1–0.9)
Monocytes: 10 %
Neutrophils Absolute: 5.3 10*3/uL (ref 1.4–7.0)
Neutrophils: 69 %
Platelets: 334 10*3/uL (ref 150–450)
RBC: 4.63 x10E6/uL (ref 3.77–5.28)
RDW: 12.2 % (ref 11.7–15.4)
WBC: 7.7 10*3/uL (ref 3.4–10.8)

## 2024-01-29 LAB — COMPREHENSIVE METABOLIC PANEL
ALT: 11 [IU]/L (ref 0–32)
AST: 19 [IU]/L (ref 0–40)
Albumin: 4.4 g/dL (ref 3.7–4.7)
Alkaline Phosphatase: 98 [IU]/L (ref 44–121)
BUN/Creatinine Ratio: 26 (ref 12–28)
BUN: 15 mg/dL (ref 8–27)
Bilirubin Total: 0.8 mg/dL (ref 0.0–1.2)
CO2: 25 mmol/L (ref 20–29)
Calcium: 10.1 mg/dL (ref 8.7–10.3)
Chloride: 99 mmol/L (ref 96–106)
Creatinine, Ser: 0.58 mg/dL (ref 0.57–1.00)
Globulin, Total: 3 g/dL (ref 1.5–4.5)
Glucose: 72 mg/dL (ref 70–99)
Potassium: 3.7 mmol/L (ref 3.5–5.2)
Sodium: 141 mmol/L (ref 134–144)
Total Protein: 7.4 g/dL (ref 6.0–8.5)
eGFR: 90 mL/min/{1.73_m2} (ref >59)

## 2024-01-29 LAB — LIPID PANEL
Chol/HDL Ratio: 1.9 {ratio} (ref 0.0–4.4)
Cholesterol, Total: 157 mg/dL (ref 100–199)
HDL: 84 mg/dL (ref >39)
LDL Chol Calc (NIH): 63 mg/dL (ref 0–99)
Triglycerides: 45 mg/dL (ref 0–149)
VLDL Cholesterol Cal: 10 mg/dL (ref 5–40)

## 2024-01-29 MED ORDER — NITROGLYCERIN 0.4 MG SL SUBL: 0.4000 mg | SUBLINGUAL_TABLET | SUBLINGUAL | 6 refills | Status: AC | PRN

## 2024-01-30 ENCOUNTER — Encounter: Payer: Self-pay | Admitting: Family Medicine

## 2024-02-10 ENCOUNTER — Ambulatory Visit
Admission: RE | Admit: 2024-02-10 | Discharge: 2024-02-10 | Disposition: A | Discharge status: Home / Self Care | Payer: Medicare Other | Source: Ambulatory Visit | Attending: Family Medicine | Admitting: Family Medicine

## 2024-02-10 DIAGNOSIS — Z1231 Encounter for screening mammogram for malignant neoplasm of breast: Secondary | ICD-10-CM | POA: Diagnosis not present

## 2024-03-06 ENCOUNTER — Other Ambulatory Visit: Payer: Self-pay | Admitting: Family Medicine

## 2024-03-06 DIAGNOSIS — I700 Atherosclerosis of aorta: Secondary | ICD-10-CM

## 2024-03-31 ENCOUNTER — Telehealth: Payer: Self-pay

## 2024-03-31 MED ORDER — DOXYCYCLINE HYCLATE 100 MG PO TABS: 100.0000 mg | ORAL_TABLET | Freq: Two times a day (BID) | ORAL | 0 refills | Status: DC

## 2024-03-31 NOTE — Telephone Encounter (Signed)
 Called and spoke with Felipa Horsfall. Advised of RB note. Pt verbalized understanding and had no concerns. NFN

## 2024-03-31 NOTE — Telephone Encounter (Signed)
 Copied from CRM [id number]. Topic: Clinical - Medical Advice >> Mar 31, 2024  1:10 PM Chantha C wrote: Reason for CRM: Patient states has a terrible head cold, head congestion, coughing green phlegm, headaches, shortness of breath (has bronchiectasis) for about a week. Patient denies pain, swelling, wheezing, dizziness, nor fever. Please advise and call back today at [phone].  [id number] - [place], [place] -[address] [place] [address] Phone: [phone] Fax: [phone]   Dr Bertrum Brodie is off all week. Sending to Dr Baldwin Levee as DOD to advise

## 2024-03-31 NOTE — Telephone Encounter (Signed)
 Will treat for possible bronchitis versus exacerbation of her bronchiectasis.  Prescription for doxycycline sent.  She needs to let us  know if she is not improving over the next 3 to 4 days.

## 2024-04-12 ENCOUNTER — Ambulatory Visit: Payer: Self-pay | Admitting: Emergency Medicine

## 2024-04-12 ENCOUNTER — Telehealth: Payer: Self-pay

## 2024-04-12 DIAGNOSIS — J479 Bronchiectasis, uncomplicated: Secondary | ICD-10-CM

## 2024-04-12 NOTE — Telephone Encounter (Signed)
 Chief Complaint: productive cough Symptoms: colored mucus Frequency: x 2 weeks Pertinent Negatives: Patient denies fever, severe SOB, CP Disposition: [] ED /[] Urgent Care (no appt availability in office) / [x] Appointment(In office/virtual)/ []  [facility]/ [] Home Care/ [] Refused Recommended Disposition /[] [facility]/ []  Follow-up with PCP Additional Notes: Pt c/o of persistent productive cough x 2 weeks. Pt reports finishing doxycycline  with no relief in sx. Pt is also out of maintenance INH x 1 week, and is unable to get it refilled d/t cost. Pt reports taking albuterol  INH as needed. Triager reinforced albuterol  usage. Triager attempted to schedule with [facility] per protocol, but pt prefers primary pulmonologist. Triager will forward encounter for Dr. Bertrum Brodie to review and advise. Patient verbalized understanding and is expecting call back from office for next steps. Triager also advised that if pt does not hear back from office, to follow disposition for further evaluation/treatment. Of note, pt requesting STIOLTO RESPIMAT  sample d/t cost.    Copied from CRM [id number]. Topic: Clinical - Red Word Triage >> Apr 12, 2024  3:12 PM Crist Dominion wrote: Red Word that prompted transfer to Nurse Triage: Patient has been coughing up green mucus for the past 2 weeks with no relief from the doxycycline  (VIBRA -TABS) 100 MG tablet Reason for Disposition  [1] Known COPD or other severe lung disease (i.e., bronchiectasis, cystic fibrosis, lung surgery) AND [2] worsening symptoms (i.e., increased sputum purulence or amount, increased breathing difficulty  Answer Assessment - Initial Assessment Questions E2C2 Pulmonary Triage - Initial Assessment Questions "Chief Complaint (e.g., cough, sob, wheezing, fever, chills, sweat or additional symptoms) *Go to specific symptom protocol after initial questions. Productive green cough - recently finished doxy  "How long have symptoms been  present?" X 2 week  Have you tested for COVID or Flu? Note: If not, ask patient if a home test can be taken. If so, instruct patient to call back for positive results. No  MEDICINES:   "Have you used any OTC meds to help with symptoms?" Yes If yes, ask "What medications?" Mucinex  "Have you used your inhalers/maintenance medication?" Yes If yes, "What medications?" Stiolto - reports has been out x 1 week and pharmacy reports rx is costly Albuterol  PRN - last used yesterday with some relief  If inhaler, ask "How many puffs and how often?" Note: Review instructions on medication in the chart. See above   OXYGEN: "Do you wear supplemental oxygen?" No If yes, "How many liters are you supposed to use?" N/a  "Do you monitor your oxygen levels?" No If yes, "What is your reading (oxygen level) today?" N/a  "What is your usual oxygen saturation reading?"  (Note: Pulmonary O2 sats should be 90% or greater) N/a    4. HEMOPTYSIS: "Are you coughing up any blood?" If so ask: "How much?" (flecks, streaks, tablespoons, etc.)     denies 5. DIFFICULTY BREATHING: "Are you having difficulty breathing?" If Yes, ask: "How bad is it?" (e.g., mild, moderate, severe)    - MILD: No SOB at rest, mild SOB with walking, speaks normally in sentences, can lie down, no retractions, pulse < 100.    - MODERATE: SOB at rest, SOB with minimal exertion and prefers to sit, cannot lie down flat, speaks in phrases, mild retractions, audible wheezing, pulse 100-120.    - SEVERE: Very SOB at rest, speaks in single words, struggling to breathe, sitting hunched forward, retractions, pulse > 120      Mild - reports SOB at baseline, a little worse now  6. FEVER: "Do you have a fever?" If Yes, ask: "What is your temperature, how was it measured, and when did it start?"     denies 7. CARDIAC HISTORY: "Do you have any history of heart disease?" (e.g., heart attack, congestive heart failure)      CAD 8. LUNG HISTORY: "Do  you have any history of lung disease?"  (e.g., pulmonary embolus, asthma, emphysema)     COPD 9. PE RISK FACTORS: "Do you have a history of blood clots?" (or: recent major surgery, recent prolonged travel, bedridden)     denies 10. OTHER SYMPTOMS: "Do you have any other symptoms?" (e.g., runny nose, wheezing, chest pain)       denies  Protocols used: Cough - Acute Productive-A-AH

## 2024-04-12 NOTE — Telephone Encounter (Signed)
 Copied from CRM [id number]. Topic: Clinical - Medical Advice >> Apr 09, 2024  2:33 PM Hilton Lucky wrote: Reason for CRM: Patient is requesting Dr. Mardell Shade nurse give her a call, as she's having issues with a medication, it's costs, and an ongoing ailment that is getting better but not as quickly as she feels it should.

## 2024-04-13 NOTE — Telephone Encounter (Signed)
 Pt states she has a really productive cough still and is wondering if she needs a chest xray she did f a round of Doxy for 7 days which she finished over the weekend, she is reporting still has a nasty productive cough with green phlegm.She does not have any fever and is wondering next steps as she is not feeling much better.

## 2024-04-13 NOTE — Telephone Encounter (Signed)
 Yes, I believe she needs an OV w a CXR. If we do not have acute OV opening, we can get the CXR and then advise / act on results but she may have to go to [facility]

## 2024-04-14 ENCOUNTER — Ambulatory Visit

## 2024-04-14 ENCOUNTER — Encounter: Payer: Self-pay | Admitting: Pulmonary Disease

## 2024-04-14 ENCOUNTER — Ambulatory Visit: Admitting: Pulmonary Disease

## 2024-04-14 VITALS — BP 124/68 | HR 86 | Temp 98.6°F | Ht 69.0 in | Wt 139.2 lb

## 2024-04-14 DIAGNOSIS — J471 Bronchiectasis with (acute) exacerbation: Secondary | ICD-10-CM | POA: Diagnosis not present

## 2024-04-14 DIAGNOSIS — J479 Bronchiectasis, uncomplicated: Secondary | ICD-10-CM

## 2024-04-14 MED ORDER — STIOLTO RESPIMAT 2.5-2.5 MCG/ACT IN AERS: 2.0000 | INHALATION_SPRAY | Freq: Every day | RESPIRATORY_TRACT | 5 refills | Status: AC

## 2024-04-14 MED ORDER — ALBUTEROL SULFATE (2.5 MG/3ML) 0.083% IN NEBU: 2.5000 mg | INHALATION_SOLUTION | Freq: Four times a day (QID) | RESPIRATORY_TRACT | 5 refills | Status: AC | PRN

## 2024-04-14 MED ORDER — LEVOFLOXACIN 500 MG PO TABS: 500.0000 mg | ORAL_TABLET | Freq: Every day | ORAL | 0 refills | Status: DC

## 2024-04-14 NOTE — Telephone Encounter (Signed)
 Spoke with Molly Ross. Pt is scheduled for acute ov with JD. DG order placed. NFN

## 2024-04-14 NOTE — Progress Notes (Signed)
 Synopsis: Hx of bronchiectasis  Subjective:   PATIENT ID: Molly Ross GENDER: female DOB: May 05, 1941, MRN: [id number]   HPI  Chief Complaint  Patient presents with   Acute Visit    Pt states x1 week coughing , green sputum.   Molly Ross is an 83 year old female with bronchiectasis who presents with increased cough and sputum production.  She has experienced a two-week history of increased cough and significant production of 'very green' sputum. She experiences chills and is unsure about having a fever. She reports decreased appetite and significant fatigue. Wheezing and shortness of breath are present. Previous treatment with doxycycline , 100 mg twice a day for seven days, did not alleviate her symptoms.  She has a history of pseudomonas in her sputum, which was pan-sensitive. She uses a flutter valve at home to help bring up more mucus but has not been using nebulizer treatments recently. She has hypertonic saline at home and has used Breo in the past, but her insurance coverage has affected her ability to obtain certain medications.  Chest x-ray: Increase in opacities compared to previous x-rays, similar areas as before with known bronchiectasis (04/14/2024)  Past Medical History:  Diagnosis Date   Arthritis    Atherosclerosis of aorta (HCC) 02/02/2015   Noted on imaging    Bronchiectasis (HCC)    Bronchiectasis with (acute) exacerbation (HCC) 06/09/2015   CAD (coronary artery disease), native coronary artery    Coronary calcification noted on CT scan 2016  Negative treadmill myoview  07/20/15 with EF 76%    Cancer (HCC)    Cellulitis of left breast 10/01/2018   Colon polyp    COPD, moderate (HCC) 11/06/2015   Coronary artery calcification seen on CT scan    Coronary calcification noted on CT scan 2016  Negative treadmill myoview  07/20/15 with EF 76%    COVID 2020   had the infusion   Depressive disorder, not elsewhere classified 12/04/2011   Dyspnea    Emphysema of  lung (HCC) 02/02/2015   Family history of lung cancer    Family history of ovarian cancer    Family history of prostate cancer    Genetic testing 03/27/2018   The Common Hereditary Cancer Panel offered by [facility] includes sequencing and/or deletion duplication testing of the following 47 genes: APC, ATM, AXIN2, BARD1, BMPR1A, BRCA1, BRCA2, BRIP1, CDH1, CDKN2A (p14ARF), CDKN2A (p16INK4a), CKD4, CHEK2, CTNNA1, DICER1, EPCAM (Deletion/duplication testing only), GREM1 (promoter region deletion/duplication testing only), KIT, MEN1, MLH1, MSH2, MSH3, MSH6, MU   GERD (gastroesophageal reflux disease)    Heart murmur    as a child after rheumatic fever, has not been heard since   Hiatal hernia 02/02/2015   small, noted on CT of chest    History of radiation therapy 04/16/18- 05/14/18   42.56 Gy directed to the Left Breast in 16 fractions followed by a boost of 8 Gy in 4 fractions   History of smoking 25-50 pack years 06/09/2015   Malignant neoplasm of lower-inner quadrant of left breast in female, 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   Osteopenia 02/18/2018   DEXA 02/2018, T-1.8 R femoral neck   Personal history of radiation therapy 2019   Pleuritic pain 10/01/2018   Pneumonia    Shingles 1/09, 07/2010, 11/2017   Unspecified vitamin D  deficiency 02/2011 and 2016   Vitamin D  deficiency 02/14/2011     Family History  Problem Relation Age of Onset   Stroke Mother    Diabetes Mother    Lung  cancer Father 8   Stroke Sister 51       brainstem stroke   Diabetes Sister    Seizures Sister    Hypertension Sister    Ovarian cancer Sister 34       metastatic to lung, liver   Cancer Sister    Heart disease Maternal Grandmother    Heart disease Paternal Grandmother    Ovarian cancer Maternal Aunt 95   Prostate cancer Cousin    Lung cancer Cousin        recurrent, now stage 4   Breast cancer Neg Hx      Social History   Socioeconomic History   Marital status: Widowed    Spouse name: Not  on file   Number of children: Not on file   Years of education: Not on file   Highest education level: Not on file  Occupational History   Occupation: [employer]  Tobacco Use   Smoking status: Former    Current packs/day: 0.00    Average packs/day: 2.0 packs/day for 24.0 years (48.0 ttl pk-yrs)    Types: Cigarettes    Start date: 06/28/1956    Quit date: 06/28/1980    Years since quitting: 43.8   Smokeless tobacco: Never   Tobacco comments:    smoked 1-2 PPD x 24 years;  Vaping Use   Vaping status: Never Used  Substance and Sexual Activity   Alcohol use: Not Currently    Comment: 1 glass of wine once a week or less   Drug use: No   Sexual activity: Not Currently  Other Topics Concern   Not on file  Social History Narrative   Her granddaughter (17 yo) moved in with her.   Widowed (2011).  Son moved to [place] (near [place])   Also has step grandson.   She works part-time at the [employer] in [place]. She works 3-5 days/week for 5 hours, or less      Updated 01/2024   Social Drivers of Health   Financial Resource Strain: Not on file  Food Insecurity: No Food Insecurity (01/29/2024)   Hunger Vital Sign    Worried About Running Out of Food in the Last Year: Never true    Ran Out of Food in the Last Year: Never true  Transportation Needs: No Transportation Needs (01/29/2024)   PRAPARE - Administrator, Civil Service (Medical): No    Lack of Transportation (Non-Medical): No  Physical Activity: Insufficiently Active (01/29/2024)   Exercise Vital Sign    Days of Exercise per Week: 1 day    Minutes of Exercise per Session: 10 min  Stress: No Stress Concern Present (01/29/2024)   [employer] of Occupational Health - Occupational Stress Questionnaire    Feeling of Stress : Only a little  Social Connections: Moderately Isolated (01/29/2024)   Social Connection and Isolation Panel [NHANES]    Frequency of Communication with Friends and  Family: More than three times a week    Frequency of Social Gatherings with Friends and Family: More than three times a week    Attends Religious Services: Never    Database administrator or Organizations: Yes    Attends Engineer, structural: More than 4 times per year    Marital Status: Widowed  Intimate Partner Violence: Not At Risk (01/29/2024)   Humiliation, Afraid, Rape, and Kick questionnaire    Fear of Current or Ex-Partner: No    Emotionally Abused: No  Physically Abused: No    Sexually Abused: No     No Known Allergies   Outpatient Medications Prior to Visit  Medication Sig Dispense Refill   Ascorbic Acid (VITAMIN C ) 100 MG tablet Take 1 tablet (100 mg total) by mouth daily. (Patient taking differently: Take 500 mg by mouth daily.)     aspirin  81 MG EC tablet Take 1 tablet (81 mg total) by mouth daily. Swallow whole. 1 tablet 0   atorvastatin  (LIPITOR) 20 MG tablet TAKE 1 TABLET BY MOUTH EVERY DAY 90 tablet 3   Calcium  Carb-Cholecalciferol (CALCIUM  600 + D PO) Take 1 tablet by mouth 2 (two) times daily.     Cholecalciferol (VITAMIN D ) 50 MCG (2000 UT) CAPS Take 2,000 Units by mouth daily.     Coenzyme Q10 (COQ10 PO) Take 300 mg by mouth daily.     ibandronate  (BONIVA ) 150 MG tablet Take 1 tablet (150 mg total) by mouth every 30 (thirty) days. Take in the morning with a full glass of water, on an empty stomach, and do not take anything else by mouth or lie down for the next 30 min. 3 tablet 3   Misc Natural Products (OSTEO BI-FLEX TRIPLE STRENGTH) TABS Take 1 tablet by mouth daily.     Multiple Vitamins-Minerals (MULTIVITAMIN WITH MINERALS) tablet Take 1 tablet by mouth daily. 50 + centrum silver     nitroGLYCERIN  (NITROSTAT ) 0.4 MG SL tablet Place 1 tablet (0.4 mg total) under the tongue every 5 (five) minutes as needed. 25 tablet 6   omeprazole (PRILOSEC OTC) 20 MG tablet Take 20 mg by mouth daily.     sodium chloride  HYPERTONIC 3 % nebulizer solution Take by  nebulization as needed for other. 750 mL 12   doxycycline  (VIBRA -TABS) 100 MG tablet Take 1 tablet (100 mg total) by mouth 2 (two) times daily. 14 tablet 0   Tiotropium Bromide -Olodaterol (STIOLTO RESPIMAT ) 2.5-2.5 MCG/ACT AERS INHALE 2 PUFFS BY MOUTH INTO THE LUNGS DAILY 4 g 5   albuterol  (PROVENTIL ) (2.5 MG/3ML) 0.083% nebulizer solution Take 3 mLs (2.5 mg total) by nebulization every 6 (six) hours as needed for wheezing or shortness of breath. (Patient not taking: Reported on 01/29/2024) 75 mL 5   No facility-administered medications prior to visit.    Review of Systems  Constitutional:  Positive for chills and malaise/fatigue. Negative for fever and weight loss.  HENT:  Negative for congestion, sinus pain and sore throat.   Eyes: Negative.   Respiratory:  Positive for cough, sputum production, shortness of breath and wheezing. Negative for hemoptysis.   Cardiovascular:  Negative for chest pain, palpitations, orthopnea, claudication and leg swelling.  Gastrointestinal:  Negative for abdominal pain, heartburn, nausea and vomiting.  Genitourinary: Negative.   Musculoskeletal:  Negative for joint pain and myalgias.  Skin:  Negative for rash.  Neurological:  Negative for weakness.  Endo/Heme/Allergies: Negative.   Psychiatric/Behavioral: Negative.     Objective:   Vitals:   04/14/24 1447  BP: 124/68  Pulse: 86  Temp: 98.6 F (37 C)  SpO2: 96%  Weight: 139 lb 3.2 oz (63.1 kg)  Height: 5\' 9"  (1.753 m)   Physical Exam Constitutional:      General: She is not in acute distress.    Appearance: Normal appearance.  Eyes:     General: No scleral icterus.    Conjunctiva/sclera: Conjunctivae normal.  Cardiovascular:     Rate and Rhythm: Normal rate and regular rhythm.  Pulmonary:     Breath sounds: Wheezing  and rhonchi present. No rales.  Musculoskeletal:     Right lower leg: No edema.     Left lower leg: No edema.  Skin:    General: Skin is warm and dry.  Neurological:      General: No focal deficit present.    CBC    Component Value Date/Time   WBC 7.7 01/29/2024 0956   WBC 8.0 02/21/2023 0557   RBC 4.63 01/29/2024 0956   RBC 4.10 02/21/2023 0557   HGB 13.5 01/29/2024 0956   HCT 41.7 01/29/2024 0956   PLT 334 01/29/2024 0956   MCV 90 01/29/2024 0956   MCH 29.2 01/29/2024 0956   MCH 29.5 02/21/2023 0557   MCHC 32.4 01/29/2024 0956   MCHC 32.6 02/21/2023 0557   RDW 12.2 01/29/2024 0956   LYMPHSABS 1.3 01/29/2024 0956   MONOABS 1.8 (H) 10/08/2022 1334   EOSABS 0.1 01/29/2024 0956   BASOSABS 0.1 01/29/2024 0956      Latest Ref Rng & Units 01/29/2024    9:56 AM 02/21/2023    5:57 AM 01/20/2023   10:20 AM  BMP  Glucose 70 - 99 mg/dL 72  90  82   BUN 8 - 27 mg/dL 15  16    Creatinine 4.09 - 1.00 mg/dL 8.11  9.14    BUN/Creat Ratio 12 - 28 26     Sodium 134 - 144 mmol/L 141  139    Potassium 3.5 - 5.2 mmol/L 3.7  3.3    Chloride 96 - 106 mmol/L 99  104    CO2 20 - 29 mmol/L 25  28    Calcium  8.7 - 10.3 mg/dL 78.2  8.9     Chest imaging: CXR 04/14/24 Increased bilateral infiltrates  PFT:    Latest Ref Rng & Units 07/05/2022    9:42 AM 10/30/2015    4:38 PM  PFT Results  FVC-Pre L 2.03  2.13   FVC-Predicted Pre % 62  61   FVC-Post L 2.01  2.26   FVC-Predicted Post % 61  64   Pre FEV1/FVC % % 58  60   Post FEV1/FCV % % 58  60   FEV1-Pre L 1.17  1.27   FEV1-Predicted Pre % 47  48   FEV1-Post L 1.17  1.36   DLCO uncorrected ml/min/mmHg 12.66  16.04   DLCO UNC% % 57  51   DLCO corrected ml/min/mmHg 12.95    DLCO COR %Predicted % 58    DLVA Predicted % 110  83   TLC L  5.24   TLC % Predicted %  90   RV % Predicted %  118     Labs:  Path:  Echo:  Heart Catheterization:       Assessment & Plan:   Bronchiectasis with (acute) exacerbation (HCC) - Plan: levofloxacin  (LEVAQUIN ) 500 MG tablet, Respiratory or Resp and Sputum Culture, albuterol  (PROVENTIL ) (2.5 MG/3ML) 0.083% nebulizer solution, Tiotropium Bromide -Olodaterol (STIOLTO  RESPIMAT) 2.5-2.5 MCG/ACT AERS, Respiratory or Resp and Sputum Culture  Discussion: Molly Ross is an 83 year old female with bronchiectasis who presents with increased cough and sputum production.  Bronchiectasis with acute exacerbation Acute exacerbation with increased sputum, chills, fatigue, wheezing, and dyspnea. Chest x-ray shows increased opacities. Previous cultures showed pan-sensitive Pseudomonas.  - Prescribed Levaquin  for 14 days. - Ordered sputum culture to guide antibiotic therapy if needed. - Instructed to use hypertonic saline nebs, albuterol  nebs and flutter valve at home to aid mucus clearance. - Refilled albuterol  nebulizer  solution for use twice daily as needed. - Refilled Stiolto inhaler.  Follow up as scheduled.  Duaine German, MD [facility] Office: [phone]    Current Outpatient Medications:    Ascorbic Acid (VITAMIN C ) 100 MG tablet, Take 1 tablet (100 mg total) by mouth daily. (Patient taking differently: Take 500 mg by mouth daily.), Disp: , Rfl:    aspirin  81 MG EC tablet, Take 1 tablet (81 mg total) by mouth daily. Swallow whole., Disp: 1 tablet, Rfl: 0   atorvastatin  (LIPITOR) 20 MG tablet, TAKE 1 TABLET BY MOUTH EVERY DAY, Disp: 90 tablet, Rfl: 3   Calcium  Carb-Cholecalciferol (CALCIUM  600 + D PO), Take 1 tablet by mouth 2 (two) times daily., Disp: , Rfl:    Cholecalciferol (VITAMIN D ) 50 MCG (2000 UT) CAPS, Take 2,000 Units by mouth daily., Disp: , Rfl:    Coenzyme Q10 (COQ10 PO), Take 300 mg by mouth daily., Disp: , Rfl:    ibandronate  (BONIVA ) 150 MG tablet, Take 1 tablet (150 mg total) by mouth every 30 (thirty) days. Take in the morning with a full glass of water, on an empty stomach, and do not take anything else by mouth or lie down for the next 30 min., Disp: 3 tablet, Rfl: 3   levofloxacin  (LEVAQUIN ) 500 MG tablet, Take 1 tablet (500 mg total) by mouth daily., Disp: 14 tablet, Rfl: 0   Misc Natural Products (OSTEO  BI-FLEX TRIPLE STRENGTH) TABS, Take 1 tablet by mouth daily., Disp: , Rfl:    Multiple Vitamins-Minerals (MULTIVITAMIN WITH MINERALS) tablet, Take 1 tablet by mouth daily. 50 + centrum silver, Disp: , Rfl:    nitroGLYCERIN  (NITROSTAT ) 0.4 MG SL tablet, Place 1 tablet (0.4 mg total) under the tongue every 5 (five) minutes as needed., Disp: 25 tablet, Rfl: 6   omeprazole (PRILOSEC OTC) 20 MG tablet, Take 20 mg by mouth daily., Disp: , Rfl:    sodium chloride  HYPERTONIC 3 % nebulizer solution, Take by nebulization as needed for other., Disp: 750 mL, Rfl: 12   albuterol  (PROVENTIL ) (2.5 MG/3ML) 0.083% nebulizer solution, Take 3 mLs (2.5 mg total) by nebulization every 6 (six) hours as needed for wheezing or shortness of breath., Disp: 75 mL, Rfl: 5   Tiotropium Bromide -Olodaterol (STIOLTO RESPIMAT ) 2.5-2.5 MCG/ACT AERS, Inhale 2 puffs into the lungs daily., Disp: 4 g, Rfl: 5

## 2024-04-14 NOTE — Patient Instructions (Signed)
 You have exacerbation of your bronchiectasis  Start levaquin  500mg  daily for 14 days  Use albuterol  and hypertonic saline nebs twice daily followed by flutter valve for airway clearance  Continue stiolto 2 puffs daily  We will check a sputum sample today  Follow up as scheduled, please call if not feeling better in a week or two

## 2024-04-14 NOTE — Addendum Note (Signed)
 Addended byGregory Leash, Jahira Swiss A on: 04/14/2024 08:06 AM   Modules accepted: Orders

## 2024-04-17 ENCOUNTER — Encounter: Payer: Self-pay | Admitting: Pulmonary Disease

## 2024-04-17 LAB — RESPIRATORY CULTURE OR RESPIRATORY AND SPUTUM CULTURE
MICRO NUMBER:: 16395190
SPECIMEN QUALITY:: ADEQUATE

## 2024-07-10 ENCOUNTER — Other Ambulatory Visit: Payer: Self-pay | Admitting: Medical

## 2024-07-10 DIAGNOSIS — M810 Age-related osteoporosis without current pathological fracture: Secondary | ICD-10-CM

## 2024-09-01 ENCOUNTER — Other Ambulatory Visit

## 2024-09-22 ENCOUNTER — Other Ambulatory Visit

## 2024-10-11 ENCOUNTER — Ambulatory Visit: Admitting: Internal Medicine

## 2024-10-11 ENCOUNTER — Encounter: Payer: Self-pay | Admitting: Emergency Medicine

## 2024-10-11 ENCOUNTER — Ambulatory Visit: Payer: Self-pay

## 2024-10-11 ENCOUNTER — Ambulatory Visit
Admission: EM | Admit: 2024-10-11 | Discharge: 2024-10-11 | Disposition: A | Discharge status: Home / Self Care | Attending: Student | Admitting: Student

## 2024-10-11 DIAGNOSIS — R399 Unspecified symptoms and signs involving the genitourinary system: Secondary | ICD-10-CM | POA: Diagnosis not present

## 2024-10-11 LAB — POCT URINE DIPSTICK
Bilirubin, UA: NEGATIVE
Blood, UA: NEGATIVE
Glucose, UA: NEGATIVE mg/dL
Ketones, POC UA: NEGATIVE mg/dL
Leukocytes, UA: NEGATIVE
Nitrite, UA: NEGATIVE
POC PROTEIN,UA: NEGATIVE
Spec Grav, UA: 1.015 (ref 1.010–1.025)
Urobilinogen, UA: 0.2 U/dL
pH, UA: 7 (ref 5.0–8.0)

## 2024-10-11 NOTE — Telephone Encounter (Signed)
 Called patient to offer her the 3:15pm and she was already signed to UC.

## 2024-10-11 NOTE — ED Provider Notes (Signed)
 [facility]    CSN: [id number] Arrival date & time: 10/11/24  1251      History   Chief Complaint Chief Complaint  Patient presents with   Possible UTI   Back Pain   Urinary Frequency    HPI Molly Ross is a 83 y.o. female presenting with lower back pain and urinary frequency for 3 days.  Medical history CAD, COPD, GERD.  Describes lower back pain that is worse with movement.  Describes urinary frequency.  Denies urgency, hematuria, dysuria, incomplete void, abdominal pain, CVAT, fevers.  She is not prone to UTI.  Verbalizes that she does not drink enough water.  HPI  Past Medical History:  Diagnosis Date   Arthritis    Atherosclerosis of aorta 02/02/2015   Noted on imaging    Bronchiectasis (HCC)    Bronchiectasis with (acute) exacerbation (HCC) 06/09/2015   CAD (coronary artery disease), native coronary artery    Coronary calcification noted on CT scan 2016  Negative treadmill myoview  07/20/15 with EF 76%    Cancer (HCC)    Cellulitis of left breast 10/01/2018   Colon polyp    COPD, moderate (HCC) 11/06/2015   Coronary artery calcification seen on CT scan    Coronary calcification noted on CT scan 2016  Negative treadmill myoview  07/20/15 with EF 76%    COVID 2020   had the infusion   Depressive disorder, not elsewhere classified 12/04/2011   Dyspnea    Emphysema of lung (HCC) 02/02/2015   Family history of lung cancer    Family history of ovarian cancer    Family history of prostate cancer    Genetic testing 03/27/2018   The Common Hereditary Cancer Panel offered by [facility] includes sequencing and/or deletion duplication testing of the following 47 genes: APC, ATM, AXIN2, BARD1, BMPR1A, BRCA1, BRCA2, BRIP1, CDH1, CDKN2A (p14ARF), CDKN2A (p16INK4a), CKD4, CHEK2, CTNNA1, DICER1, EPCAM (Deletion/duplication testing only), GREM1 (promoter region deletion/duplication testing only), KIT, MEN1, MLH1, MSH2, MSH3, MSH6, MU   GERD (gastroesophageal reflux  disease)    Heart murmur    as a child after rheumatic fever, has not been heard since   Hiatal hernia 02/02/2015   small, noted on CT of chest    History of radiation therapy 04/16/18- 05/14/18   42.56 Gy directed to the Left Breast in 16 fractions followed by a boost of 8 Gy in 4 fractions   History of smoking 25-50 pack years 06/09/2015   Malignant neoplasm of lower-inner quadrant of left breast in female, 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   Osteopenia 02/18/2018   DEXA 02/2018, T-1.8 R femoral neck   Personal history of radiation therapy 2019   Pleuritic pain 10/01/2018   Pneumonia    Shingles 1/09, 07/2010, 11/2017   Unspecified vitamin D  deficiency 02/2011 and 2016   Vitamin D  deficiency 02/14/2011    Patient Active Problem List   Diagnosis Date Noted   Allergic rhinitis 04/29/2023   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   Osteoporosis without current pathological fracture 01/19/2023   Rib injury 03/14/2022   Shingles    History of radiation therapy    GERD (gastroesophageal reflux disease)    Dyspnea    Colon polyp    Cancer (HCC)    CAD (coronary artery disease), native coronary artery    Bronchiectasis (HCC)    Senile purpura 08/25/2019   Hyperlipidemia 02/09/2019   Cellulitis of left breast 10/01/2018   Pleuritic  pain 10/01/2018   Genetic testing 03/27/2018   Family history of ovarian cancer    Family history of lung cancer    Family history of prostate cancer    Malignant neoplasm of lower-inner quadrant of left breast in female, 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   Osteopenia 02/18/2018   Personal history of radiation therapy 2019   COPD, moderate (HCC) 11/06/2015   Coronary artery calcification seen on CT scan    Bronchiectasis with (acute) exacerbation (HCC) 06/09/2015   History of smoking 25-50 pack years 06/09/2015   Atherosclerosis of aorta 02/02/2015   Hiatal hernia 02/02/2015    Emphysema of lung (HCC) 02/02/2015   Depressive disorder, not elsewhere classified 12/04/2011   Vitamin D  deficiency 02/14/2011    Past Surgical History:  Procedure Laterality Date   BREAST EXCISIONAL BIOPSY Right    benign years ago   BREAST LUMPECTOMY Left 2019   BREAST LUMPECTOMY WITH RADIOACTIVE SEED AND SENTINEL LYMPH NODE BIOPSY Left 03/19/2018   Procedure: BREAST LUMPECTOMY WITH RADIOACTIVE SEED AND SENTINEL LYMPH NODE BIOPSY;  Surgeon: Aron Shoulders, MD;  Location: [facility];  Service: General;  Laterality: Left;   BRONCHIAL WASHINGS  02/21/2023   Procedure: BRONCHIAL WASHINGS;  Surgeon: Geronimo Amel, MD;  Location: [facility] ENDOSCOPY;  Service: Endoscopy;;   CATARACT EXTRACTION, BILATERAL Bilateral 03/2017, 04/2017   [facility], Dr. Regenia   SHOULDER SURGERY  03/2011   for frozen shoulder, and cyst removal (Dr. Beverley)   TONSILLECTOMY  child   VIDEO BRONCHOSCOPY Bilateral 06/21/2016   Procedure: VIDEO BRONCHOSCOPY WITHOUT FLUORO;  Surgeon: Amel Geronimo, MD;  Location: [facility] ENDOSCOPY;  Service: Cardiopulmonary;  Laterality: Bilateral;   VIDEO BRONCHOSCOPY N/A 02/21/2023   Procedure: VIDEO BRONCHOSCOPY WITHOUT FLUORO;  Surgeon: Geronimo Amel, MD;  Location[facility] ENDOSCOPY;  Service: Endoscopy;  Laterality: N/A;    OB History     Gravida  2   Para      Term      Preterm      AB  1   Living  1      SAB  1   IAB      Ectopic      Multiple      Live Births               Home Medications    Prior to Admission medications   Medication Sig Start Date End Date Taking? Authorizing Provider  albuterol  (PROVENTIL ) (2.5 MG/3ML) 0.083% nebulizer solution Take 3 mLs (2.5 mg total) by nebulization every 6 (six) hours as needed for wheezing or shortness of breath. 04/14/24 04/14/25  Kara Dorn NOVAK, MD  Ascorbic Acid (VITAMIN C ) 100 MG tablet Take 1 tablet (100 mg total) by mouth daily. Patient taking differently: Take 500 mg by mouth daily. 04/01/18    Odean Potts, MD  aspirin  81 MG EC tablet Take 1 tablet (81 mg total) by mouth daily. Swallow whole. 12/05/21   Randol Dawes, MD  atorvastatin  (LIPITOR) 20 MG tablet TAKE 1 TABLET BY MOUTH EVERY DAY 03/08/24   Randol Dawes, MD  Calcium  Carb-Cholecalciferol (CALCIUM  600 + D PO) Take 1 tablet by mouth 2 (two) times daily.    [provider]  Cholecalciferol (VITAMIN D ) 50 MCG (2000 UT) CAPS Take 2,000 Units by mouth daily.    [provider]  Coenzyme Q10 (COQ10 PO) Take 300 mg by mouth daily.    [provider]  ibandronate  (BONIVA ) 150 MG tablet TAKE 1 TABLET (150 MG TOTAL) BY  MOUTH EVERY 30 (THIRTY) DAYS. TAKE IN THE MORNING WITH A FULL GLASS OF WATER, ON AN EMPTY STOMACH, AND DO NOT TAKE ANYTHING ELSE BY MOUTH OR LIE DOWN FOR THE NEXT 30 MIN. 07/12/24   Randol Dawes, MD  levofloxacin  (LEVAQUIN ) 500 MG tablet Take 1 tablet (500 mg total) by mouth daily. 04/14/24   Kara Dorn NOVAK, MD  Misc Natural Products (OSTEO BI-FLEX TRIPLE STRENGTH) TABS Take 1 tablet by mouth daily.    [provider]  Multiple Vitamins-Minerals (MULTIVITAMIN WITH MINERALS) tablet Take 1 tablet by mouth daily. 50 + centrum silver    [provider]  nitroGLYCERIN  (NITROSTAT ) 0.4 MG SL tablet Place 1 tablet (0.4 mg total) under the tongue every 5 (five) minutes as needed. 01/29/24 04/28/24  Randol Dawes, MD  omeprazole (PRILOSEC OTC) 20 MG tablet Take 20 mg by mouth daily.    [provider]  sodium chloride  HYPERTONIC 3 % nebulizer solution Take by nebulization as needed for other. 09/26/23   Geronimo Amel, MD  Tiotropium Bromide -Olodaterol (STIOLTO RESPIMAT ) 2.5-2.5 MCG/ACT AERS Inhale 2 puffs into the lungs daily. 04/14/24   Kara Dorn NOVAK, MD    Family History Family History  Problem Relation Age of Onset   Stroke Mother    Diabetes Mother    Lung cancer Father 15   Stroke Sister 75       brainstem stroke   Diabetes Sister    Seizures Sister    Hypertension  Sister    Ovarian cancer Sister 50       metastatic to lung, liver   Cancer Sister    Heart disease Maternal Grandmother    Heart disease Paternal Grandmother    Ovarian cancer Maternal Aunt 95   Prostate cancer Cousin    Lung cancer Cousin        recurrent, now stage 4   Breast cancer Neg Hx     Social History Social History   Tobacco Use   Smoking status: Former    Current packs/day: 0.00    Average packs/day: 2.0 packs/day for 24.0 years (48.0 ttl pk-yrs)    Types: Cigarettes    Start date: 06/28/1956    Quit date: 06/28/1980    Years since quitting: 44.3   Smokeless tobacco: Never   Tobacco comments:    smoked 1-2 PPD x 24 years;  Vaping Use   Vaping status: Never Used  Substance Use Topics   Alcohol use: Not Currently    Comment: 1 glass of wine once a week or less   Drug use: No     Allergies   Patient has no known allergies.   Review of Systems Review of Systems  Constitutional:  Negative for appetite change, chills, diaphoresis and fever.  Respiratory:  Negative for shortness of breath.   Cardiovascular:  Negative for chest pain.  Gastrointestinal:  Negative for abdominal pain, blood in stool, constipation, diarrhea, nausea and vomiting.  Genitourinary:  Positive for frequency. Negative for decreased urine volume, difficulty urinating, dysuria, flank pain, genital sores, hematuria and urgency.  Musculoskeletal:  Negative for back pain.  Neurological:  Negative for dizziness, weakness and light-headedness.  All other systems reviewed and are negative.    Physical Exam Triage Vital Signs ED Triage Vitals  Encounter Vitals Group     BP 10/11/24 1322 131/72     Girls Systolic BP Percentile --      Girls Diastolic BP Percentile --      Boys Systolic BP Percentile --  Boys Diastolic BP Percentile --      Pulse Rate 10/11/24 1322 85     Resp 10/11/24 1322 18     Temp 10/11/24 1322 98.3 F (36.8 C)     Temp Source 10/11/24 1322 Oral     SpO2  10/11/24 1322 95 %     Weight 10/11/24 1321 139 lb 1.8 oz (63.1 kg)     Height --      Head Circumference --      Peak Flow --      Pain Score 10/11/24 1320 8     Pain Loc --      Pain Education --      Exclude from Growth Chart --    No data found.  Updated Vital Signs BP 131/72 (BP Location: Left Arm)   Pulse 85   Temp 98.3 F (36.8 C) (Oral)   Resp 18   Wt 139 lb 1.8 oz (63.1 kg)   SpO2 95%   BMI 20.54 kg/m   Visual Acuity Right Eye Distance:   Left Eye Distance:   Bilateral Distance:    Right Eye Near:   Left Eye Near:    Bilateral Near:     Physical Exam Vitals reviewed.  Constitutional:      General: She is not in acute distress.    Appearance: Normal appearance. She is not ill-appearing.  HENT:     Head: Normocephalic and atraumatic.     Mouth/Throat:     Mouth: Mucous membranes are moist.     Comments: Moist mucous membranes Eyes:     Extraocular Movements: Extraocular movements intact.     Pupils: Pupils are equal, round, and reactive to light.  Cardiovascular:     Rate and Rhythm: Normal rate and regular rhythm.     Heart sounds: Normal heart sounds.  Pulmonary:     Effort: Pulmonary effort is normal.     Breath sounds: Normal breath sounds. No wheezing, rhonchi or rales.  Abdominal:     General: Bowel sounds are normal. There is no distension.     Palpations: Abdomen is soft. There is no mass.     Tenderness: There is no abdominal tenderness. There is no right CVA tenderness, left CVA tenderness, guarding or rebound.  Musculoskeletal:     Lumbar back: Spasms and tenderness present.     Comments: Lumbar paraspinous tenderness elicited with flexion and extension lumbar spine.  No midline spinous tenderness or bony tenderness.  Skin:    General: Skin is warm.     Capillary Refill: Capillary refill takes less than 2 seconds.     Comments: Good skin turgor  Neurological:     General: No focal deficit present.     Mental Status: She is alert and  oriented to person, place, and time.  Psychiatric:        Mood and Affect: Mood normal.        Behavior: Behavior normal.      UC Treatments / Results  Labs (all labs ordered are listed, but only abnormal results are displayed) Labs Reviewed  URINE CULTURE  POCT URINE DIPSTICK    EKG   Radiology No results found.  Procedures Procedures (including critical care time)  Medications Ordered in UC Medications - No data to display  Initial Impression / Assessment and Plan / UC Course  I have reviewed the triage vital signs and the nursing notes.  Pertinent labs & imaging results that were available during my care of  the patient were reviewed by me and considered in my medical decision making (see chart for details).     Patient is an 83 year old female presenting with urinary frequency.  She is afebrile and nontachycardic.  On exam, there is no CVAT or suprapubic tenderness.  She is not UTI prone.  UA is within normal limits; negative for blood, nitrite, leukocyte.  Culture sent.  Will wait to treat pending culture results.  Good hydration.   Final Clinical Impressions(s) / UC Diagnoses   Final diagnoses:  Urinary symptom or sign   Discharge Instructions   None    ED Prescriptions   None    PDMP not reviewed this encounter.   Molly Leita BRAVO, PA-C 10/11/24 [phone]

## 2024-10-11 NOTE — ED Triage Notes (Signed)
 Pt presents c/o back pain and freq urination x 3 days. Pt states,  I started noticing back pain late Friday. I go to the bathroom often. I'm so bad not drinking lots of water. I need to do better.

## 2024-10-11 NOTE — Telephone Encounter (Signed)
 FYI Only or Action Required?: FYI only for provider.  Patient was last seen in primary care on 01/29/2024 by Randol Dawes, MD.  Called Nurse Triage reporting Urinary Frequency.  Symptoms began yesterday.  Interventions attempted: Rest, hydration, or home remedies.  Symptoms are: gradually worsening.  Triage Disposition: See Physician Within 24 Hours  Patient/caregiver understands and will follow disposition?: Yes Patient to UC, wanted to be seen early today   Copied from CRM #[id number]. Topic: Clinical - Red Word Triage >> Oct 11, 2024 12:28 PM Molly Ross wrote: Red Word that prompted transfer to Nurse Triage: UTI- frequent urination, back pain for a couple of days  Would like an appt for today Reason for Disposition  Urinating more frequently than usual (i.e., frequency) OR new-onset of the feeling of an urgent need to urinate (i.e., urgency)  Answer Assessment - Initial Assessment Questions 1. SYMPTOM: What's the main symptom you're concerned about? (e.g., frequency, incontinence)     Frequency, back pain 2. ONSET: When did the  symptoms  start?     A couple of days ago 3. PAIN: Is there any pain? If Yes, ask: How bad is it? (Scale: 1-10; mild, moderate, severe)     denies 4. CAUSE: What do you think is causing the symptoms?     Possible uti 5. OTHER SYMPTOMS: Do you have any other symptoms? (e.g., blood in urine, fever, flank pain, pain with urination)     denies 6. PREGNANCY: Is there any chance you are pregnant? When was your last menstrual period?     N/a  Protocols used: Urinary Symptoms-A-AH

## 2024-10-12 LAB — URINE CULTURE: Culture: <10000 — AB

## 2024-10-13 ENCOUNTER — Ambulatory Visit (HOSPITAL_COMMUNITY): Payer: Self-pay

## 2024-10-15 ENCOUNTER — Other Ambulatory Visit: Payer: Self-pay | Admitting: Family Medicine

## 2024-10-15 DIAGNOSIS — M810 Age-related osteoporosis without current pathological fracture: Secondary | ICD-10-CM

## 2024-11-01 ENCOUNTER — Ambulatory Visit: Payer: Self-pay | Admitting: Pulmonary Disease

## 2024-11-01 NOTE — Telephone Encounter (Signed)
 FYI Only or Action Required?: Action required by provider: update on patient condition. Patient reluctant to schedule appt. Would like to have something called in, amenable to virtual appt.   Patient is followed in Pulmonology for Bronchiectasis, last seen on 04/14/2024 by Kara Dorn NOVAK, MD.  Called Nurse Triage reporting Pain and Cough.  Symptoms began yesterday.  Interventions attempted: Nebulizer treatments.  Symptoms are: gradually worsening.  Triage Disposition: See Physician Within 24 Hours  Patient/caregiver understands and will follow disposition?: Unsure  Copied from CRM #[id number]. Topic: Clinical - Red Word Triage >> Nov 01, 2024  4:20 PM Rilla B wrote: [facility] that prompted transfer to Nurse Triage: Pain in lungs when taking deep breath Reason for Disposition  [1] Known COPD or other severe lung disease (i.e., bronchiectasis, cystic fibrosis, lung surgery) AND [2] symptoms getting worse (i.e., increased sputum purulence or amount, increased breathing difficulty  Answer Assessment - Initial Assessment Questions States that she normally calls the office with a flare up and the doctor calls in a steroid and antibiotic Pharmacy: [facility] on [address]   1. ONSET: When did the cough begin?      today 2. SEVERITY: How bad is the cough today?  moderate 3. SPUTUM: Describe the color of your sputum (e.g., none, dry cough; clear, white, yellow, green)     green 4. HEMOPTYSIS: Are you coughing up any blood? If Yes, ask: How much? (e.g., flecks, streaks, tablespoons, etc.)     denies 5. DIFFICULTY BREATHING: Are you having difficulty breathing? If Yes, ask: How bad is it? (e.g., mild, moderate, severe)      Not difficult, but painful 6. FEVER: Do you have a fever? If Yes, ask: What is your temperature, how was it measured, and when did it start?     denies 7. CARDIAC HISTORY: Do you have any history of heart disease? (e.g., heart attack, congestive heart  failure)      Coronary artery disease 8. LUNG HISTORY: Do you have any history of lung disease?  (e.g., pulmonary embolus, asthma, emphysema)     Bronchiectasis, COPD 9. PE RISK FACTORS: Do you have a history of blood clots? (or: recent major surgery, recent prolonged travel, bedridden)     none 10. OTHER SYMPTOMS: Do you have any other symptoms? (e.g., runny nose, wheezing, chest pain)       Pain with deep breath  Protocols used: Cough - Acute Productive-A-AH

## 2024-11-02 ENCOUNTER — Telehealth: Payer: Self-pay

## 2024-11-02 NOTE — Telephone Encounter (Signed)
ATC x1.  LMTCB. 

## 2024-11-02 NOTE — Telephone Encounter (Signed)
 Copied from CRM #[id number]. Topic: General - Other >> Nov 02, 2024  8:40 AM Joesph PARAS wrote: Reason for CRM: Patient is returning call to [facility]. No documentation, unable to advise. Please return call to patient.  Will address per other encounter

## 2024-11-03 ENCOUNTER — Encounter: Payer: Self-pay | Admitting: Pulmonary Disease

## 2024-11-03 ENCOUNTER — Ambulatory Visit: Admitting: Pulmonary Disease

## 2024-11-03 VITALS — BP 129/67 | HR 80 | Ht 69.0 in | Wt 131.0 lb

## 2024-11-03 DIAGNOSIS — J471 Bronchiectasis with (acute) exacerbation: Secondary | ICD-10-CM

## 2024-11-03 MED ORDER — ALBUTEROL SULFATE HFA 108 (90 BASE) MCG/ACT IN AERS: 2.0000 | INHALATION_SPRAY | Freq: Four times a day (QID) | RESPIRATORY_TRACT | 6 refills | Status: AC | PRN

## 2024-11-03 MED ORDER — LEVOFLOXACIN 500 MG PO TABS: 500.0000 mg | ORAL_TABLET | Freq: Every day | ORAL | 0 refills | Status: AC

## 2024-11-03 MED ORDER — STIOLTO RESPIMAT 2.5-2.5 MCG/ACT IN AERS: INHALATION_SPRAY | RESPIRATORY_TRACT | Status: AC

## 2024-11-03 NOTE — Patient Instructions (Addendum)
 I am concerned you have a flare in your bronchiectasis  Start levaquin  500mg  tablet daily for 10 days  Continue stiolto 2 puffs daily  Use albuterol  inhaler 1-2 puffs or nebulizer treatment every 4-6 hours as needed  Follow up in 4 months, call sooner if needed

## 2024-11-03 NOTE — Assessment & Plan Note (Addendum)
  Orders:   Respiratory or Resp and Sputum Culture; Future   levofloxacin  (LEVAQUIN ) 500 MG tablet; Take 1 tablet (500 mg total) by mouth daily.   albuterol  (VENTOLIN  HFA) 108 (90 Base) MCG/ACT inhaler; Inhale 2 puffs into the lungs every 6 (six) hours as needed for wheezing or shortness of breath.

## 2024-11-03 NOTE — Telephone Encounter (Signed)
 I called and spoke with patient, advised her that we have not seen her since April of 2025, she was sick at that time and would need an OV.  She was at the Dermatologist office at the time of my call, however was agreeable to scheduling an appointment with Dr. Kara for today at 11:30 am, to arrive at 11:15 am for check in.  She will let us  know if something happens at the dermatologist office that would keep her from making it to her appointment.  Nothing further needed.

## 2024-11-03 NOTE — Progress Notes (Signed)
 Established Patient Pulmonology Office Visit   Subjective:  Patient ID: Molly Ross, female    DOB: [birth year]  MRN: [id number]  CC:  Chief Complaint  Patient presents with   Medical Management of Chronic Issues    Acute- Pt states chest pain at night while laying down LT side x couple days. Coughing up green thick hard to bring up     Discussed the use of AI scribe software for clinical note transcription with the patient, who gave verbal consent to proceed.  History of Present Illness Molly Ross is an 83 year old female with bronchiectasis who presents with chest tightness and discomfort.  Chest tightness and discomfort have been present for about a week, particularly when lying down at night, requiring elevation for relief. The discomfort is more pronounced on the left side, whereas previously the right side was more affected. She experiences a sensation of her entire chest wall not feeling good during deep breaths.  There are no fevers, chills, or muscle aches. Her mucus is greenish, consistent with her history, and she recently completed a two-week course of antibiotics for a pseudomonas infection, which had improved her condition until the recent symptoms.  Her current medications include a hypertonic saline nebulizer and a flutter valve. She has not renewed her Stiolto inhaler due to cost, approximately three hundred dollars, and uses an albuterol  nebulizer as needed. Her symptoms affect her daily activities, including her part-time work and commitments.        ROS    Current Outpatient Medications:    albuterol  (PROVENTIL ) (2.5 MG/3ML) 0.083% nebulizer solution, Take 3 mLs (2.5 mg total) by nebulization every 6 (six) hours as needed for wheezing or shortness of breath., Disp: 75 mL, Rfl: 5   albuterol  (VENTOLIN  HFA) 108 (90 Base) MCG/ACT inhaler, Inhale 2 puffs into the lungs every 6 (six) hours as needed for wheezing or shortness of breath., Disp: 8 g, Rfl: 6    Ascorbic Acid (VITAMIN C ) 100 MG tablet, Take 1 tablet (100 mg total) by mouth daily. (Patient taking differently: Take 500 mg by mouth daily.), Disp: , Rfl:    aspirin  81 MG EC tablet, Take 1 tablet (81 mg total) by mouth daily. Swallow whole., Disp: 1 tablet, Rfl: 0   atorvastatin  (LIPITOR) 20 MG tablet, TAKE 1 TABLET BY MOUTH EVERY DAY, Disp: 90 tablet, Rfl: 3   Calcium  Carb-Cholecalciferol (CALCIUM  600 + D PO), Take 1 tablet by mouth 2 (two) times daily., Disp: , Rfl:    Cholecalciferol (VITAMIN D ) 50 MCG (2000 UT) CAPS, Take 2,000 Units by mouth daily., Disp: , Rfl:    Coenzyme Q10 (COQ10 PO), Take 300 mg by mouth daily., Disp: , Rfl:    ibandronate  (BONIVA ) 150 MG tablet, TAKE 1 TABLET (150 MG TOTAL) BY MOUTH EVERY 30 (THIRTY) DAYS. TAKE IN THE MORNING WITH A FULL GLASS OF WATER, ON AN EMPTY STOMACH, AND DO NOT TAKE ANYTHING ELSE BY MOUTH OR LIE DOWN FOR THE NEXT 30 MIN., Disp: 3 tablet, Rfl: 0   levofloxacin  (LEVAQUIN ) 500 MG tablet, Take 1 tablet (500 mg total) by mouth daily., Disp: 10 tablet, Rfl: 0   Misc Natural Products (OSTEO BI-FLEX TRIPLE STRENGTH) TABS, Take 1 tablet by mouth daily., Disp: , Rfl:    Multiple Vitamins-Minerals (MULTIVITAMIN WITH MINERALS) tablet, Take 1 tablet by mouth daily. 50 + centrum silver, Disp: , Rfl:    nitroGLYCERIN  (NITROSTAT ) 0.4 MG SL tablet, Place 1 tablet (0.4 mg total) under the tongue  every 5 (five) minutes as needed., Disp: 25 tablet, Rfl: 6   omeprazole (PRILOSEC OTC) 20 MG tablet, Take 20 mg by mouth daily., Disp: , Rfl:    sodium chloride  HYPERTONIC 3 % nebulizer solution, Take by nebulization as needed for other., Disp: 750 mL, Rfl: 12   Tiotropium Bromide -Olodaterol (STIOLTO RESPIMAT ) 2.5-2.5 MCG/ACT AERS, Inhale 2 puffs into the lungs daily., Disp: 4 g, Rfl: 5   Tiotropium Bromide -Olodaterol (STIOLTO RESPIMAT ) 2.5-2.5 MCG/ACT AERS, Samples 2, Disp: , Rfl:       Objective:  BP 129/67   Pulse 80   Ht 5' 9 (1.753 m) Comment: per pt  Wt 131  lb (59.4 kg)   BMI 19.35 kg/m     Physical Exam Constitutional:      General: She is not in acute distress.    Appearance: Normal appearance.  Eyes:     General: No scleral icterus.    Conjunctiva/sclera: Conjunctivae normal.  Cardiovascular:     Rate and Rhythm: Normal rate and regular rhythm.  Pulmonary:     Breath sounds: No wheezing, rhonchi or rales.  Musculoskeletal:     Right lower leg: No edema.     Left lower leg: No edema.  Skin:    General: Skin is warm and dry.  Neurological:     General: No focal deficit present.    Diagnostic Review:  Last CBC Lab Results  Component Value Date   WBC 7.7 01/29/2024   HGB 13.5 01/29/2024   HCT 41.7 01/29/2024   MCV 90 01/29/2024   MCH 29.2 01/29/2024   RDW 12.2 01/29/2024   PLT 334 01/29/2024   Last metabolic panel Lab Results  Component Value Date   GLUCOSE 72 01/29/2024   NA 141 01/29/2024   K 3.7 01/29/2024   CL 99 01/29/2024   CO2 25 01/29/2024   BUN 15 01/29/2024   CREATININE 0.58 01/29/2024   EGFR 90 01/29/2024   CALCIUM  10.1 01/29/2024   PROT 7.4 01/29/2024   ALBUMIN 4.4 01/29/2024   LABGLOB 3.0 01/29/2024   AGRATIO 1.6 05/28/2022   BILITOT 0.8 01/29/2024   ALKPHOS 98 01/29/2024   AST 19 01/29/2024   ALT 11 01/29/2024   ANIONGAP 7 02/21/2023       Assessment & Plan:   Assessment & Plan Bronchiectasis with (acute) exacerbation (HCC)  Orders:   Respiratory or Resp and Sputum Culture; Future   levofloxacin  (LEVAQUIN ) 500 MG tablet; Take 1 tablet (500 mg total) by mouth daily.   albuterol  (VENTOLIN  HFA) 108 (90 Base) MCG/ACT inhaler; Inhale 2 puffs into the lungs every 6 (six) hours as needed for wheezing or shortness of breath.   Assessment and Plan Assessment & Plan Bronchiectasis with acute exacerbation  - history of Pseudomonas airway infection  - Ordered sputum culture to monitor Pseudomonas resistance. - Prescribed Levaquin  for 10 days. - Provided Stiolto inhaler samples for one  month. - Instructed on hypertonic saline nebulizer twice daily. - Advised albuterol  nebulizer or inhaler as needed, minimum four-hour intervals. - Scheduled follow-up in four months.      Return in about 4 months (around 03/03/2025) for f/u visit Dr. Kara.   Dorn KATHEE Kara, MD

## 2024-11-06 LAB — RESPIRATORY CULTURE OR RESPIRATORY AND SPUTUM CULTURE
MICRO NUMBER:: 17256780
SPECIMEN QUALITY:: ADEQUATE

## 2024-11-08 ENCOUNTER — Ambulatory Visit: Payer: Self-pay | Admitting: Pulmonary Disease

## 2024-11-17 ENCOUNTER — Ambulatory Visit: Payer: Self-pay | Admitting: Family Medicine

## 2024-11-17 ENCOUNTER — Ambulatory Visit

## 2024-11-17 DIAGNOSIS — M810 Age-related osteoporosis without current pathological fracture: Secondary | ICD-10-CM

## 2025-02-14 ENCOUNTER — Ambulatory Visit: Payer: Medicare Other | Admitting: Family Medicine

## 2025-03-29 ENCOUNTER — Ambulatory Visit: Admitting: Pulmonary Disease
